# Patient Record
Sex: Female | Born: 1965 | Race: White | Hispanic: No | Smoking: Former smoker
Health system: Southern US, Community
[De-identification: ages and names within clinical notes are randomized; demographics above are authoritative.]

## PROBLEM LIST (undated history)

## (undated) DIAGNOSIS — F32A Depression, unspecified: Secondary | ICD-10-CM

## (undated) DIAGNOSIS — F419 Anxiety disorder, unspecified: Secondary | ICD-10-CM

## (undated) DIAGNOSIS — I700 Atherosclerosis of aorta: Secondary | ICD-10-CM

## (undated) DIAGNOSIS — J189 Pneumonia, unspecified organism: Secondary | ICD-10-CM

## (undated) DIAGNOSIS — Z79899 Other long term (current) drug therapy: Secondary | ICD-10-CM

## (undated) DIAGNOSIS — Z7901 Long term (current) use of anticoagulants: Secondary | ICD-10-CM

## (undated) DIAGNOSIS — E119 Type 2 diabetes mellitus without complications: Secondary | ICD-10-CM

## (undated) DIAGNOSIS — N3281 Overactive bladder: Secondary | ICD-10-CM

## (undated) DIAGNOSIS — I10 Essential (primary) hypertension: Secondary | ICD-10-CM

## (undated) DIAGNOSIS — R7303 Prediabetes: Secondary | ICD-10-CM

## (undated) DIAGNOSIS — M1612 Unilateral primary osteoarthritis, left hip: Secondary | ICD-10-CM

## (undated) DIAGNOSIS — K219 Gastro-esophageal reflux disease without esophagitis: Secondary | ICD-10-CM

## (undated) DIAGNOSIS — E782 Mixed hyperlipidemia: Secondary | ICD-10-CM

## (undated) DIAGNOSIS — I4891 Unspecified atrial fibrillation: Secondary | ICD-10-CM

## (undated) DIAGNOSIS — E785 Hyperlipidemia, unspecified: Secondary | ICD-10-CM

## (undated) DIAGNOSIS — M797 Fibromyalgia: Secondary | ICD-10-CM

---

## 1994-10-16 HISTORY — PX: ABDOMINAL SURGERY: SHX537

## 1994-10-16 HISTORY — PX: GASTRIC BYPASS OPEN: SUR638

## 2007-10-17 HISTORY — PX: TARSAL TUNNEL RELEASE: SUR1099

## 2007-10-17 HISTORY — PX: PLANTAR FASCIA SURGERY: SHX746

## 2008-01-20 ENCOUNTER — Ambulatory Visit: Payer: Self-pay

## 2009-02-16 ENCOUNTER — Ambulatory Visit: Payer: Self-pay | Admitting: Podiatry

## 2009-02-19 ENCOUNTER — Ambulatory Visit: Payer: Self-pay | Admitting: Podiatry

## 2009-10-21 ENCOUNTER — Emergency Department: Payer: Self-pay | Admitting: Internal Medicine

## 2013-08-18 ENCOUNTER — Emergency Department: Payer: Self-pay | Admitting: Emergency Medicine

## 2015-02-02 DIAGNOSIS — F32A Depression, unspecified: Secondary | ICD-10-CM | POA: Diagnosis present

## 2015-02-02 DIAGNOSIS — G2581 Restless legs syndrome: Secondary | ICD-10-CM | POA: Insufficient documentation

## 2015-02-02 DIAGNOSIS — M797 Fibromyalgia: Secondary | ICD-10-CM | POA: Insufficient documentation

## 2015-02-02 DIAGNOSIS — F331 Major depressive disorder, recurrent, moderate: Secondary | ICD-10-CM | POA: Insufficient documentation

## 2015-02-02 HISTORY — DX: Restless legs syndrome: G25.81

## 2015-04-28 ENCOUNTER — Encounter: Payer: Self-pay | Admitting: Emergency Medicine

## 2015-04-28 ENCOUNTER — Emergency Department
Admission: EM | Admit: 2015-04-28 | Discharge: 2015-04-28 | Discharge status: Against Medical Advice | Payer: No Typology Code available for payment source | Attending: Emergency Medicine | Admitting: Emergency Medicine

## 2015-04-28 ENCOUNTER — Emergency Department: Payer: No Typology Code available for payment source

## 2015-04-28 ENCOUNTER — Emergency Department
Admission: EM | Admit: 2015-04-28 | Discharge: 2015-04-28 | Disposition: A | Discharge status: Home / Self Care | Payer: No Typology Code available for payment source | Attending: Emergency Medicine | Admitting: Emergency Medicine

## 2015-04-28 DIAGNOSIS — Y998 Other external cause status: Secondary | ICD-10-CM | POA: Insufficient documentation

## 2015-04-28 DIAGNOSIS — Z3202 Encounter for pregnancy test, result negative: Secondary | ICD-10-CM | POA: Diagnosis not present

## 2015-04-28 DIAGNOSIS — Z79899 Other long term (current) drug therapy: Secondary | ICD-10-CM | POA: Diagnosis not present

## 2015-04-28 DIAGNOSIS — Z23 Encounter for immunization: Secondary | ICD-10-CM | POA: Insufficient documentation

## 2015-04-28 DIAGNOSIS — Z720 Tobacco use: Secondary | ICD-10-CM | POA: Diagnosis not present

## 2015-04-28 DIAGNOSIS — S161XXA Strain of muscle, fascia and tendon at neck level, initial encounter: Secondary | ICD-10-CM | POA: Insufficient documentation

## 2015-04-28 DIAGNOSIS — Y9389 Activity, other specified: Secondary | ICD-10-CM | POA: Insufficient documentation

## 2015-04-28 DIAGNOSIS — S0101XA Laceration without foreign body of scalp, initial encounter: Secondary | ICD-10-CM | POA: Insufficient documentation

## 2015-04-28 DIAGNOSIS — Z791 Long term (current) use of non-steroidal anti-inflammatories (NSAID): Secondary | ICD-10-CM | POA: Insufficient documentation

## 2015-04-28 DIAGNOSIS — Z880 Allergy status to penicillin: Secondary | ICD-10-CM | POA: Diagnosis not present

## 2015-04-28 DIAGNOSIS — Y92410 Unspecified street and highway as the place of occurrence of the external cause: Secondary | ICD-10-CM | POA: Insufficient documentation

## 2015-04-28 DIAGNOSIS — S199XXA Unspecified injury of neck, initial encounter: Secondary | ICD-10-CM | POA: Diagnosis not present

## 2015-04-28 DIAGNOSIS — S0990XA Unspecified injury of head, initial encounter: Secondary | ICD-10-CM | POA: Diagnosis present

## 2015-04-28 DIAGNOSIS — S0181XA Laceration without foreign body of other part of head, initial encounter: Secondary | ICD-10-CM | POA: Diagnosis not present

## 2015-04-28 HISTORY — DX: Fibromyalgia: M79.7

## 2015-04-28 LAB — GLUCOSE, CAPILLARY: Glucose-Capillary: 101 mg/dL — ABNORMAL HIGH (ref 65–99)

## 2015-04-28 LAB — POCT PREGNANCY, URINE: PREG TEST UR: NEGATIVE

## 2015-04-28 MED ORDER — TETANUS-DIPHTH-ACELL PERTUSSIS 5-2.5-18.5 LF-MCG/0.5 IM SUSP
0.5000 mL | Freq: Once | INTRAMUSCULAR | Status: AC
  Administered 2015-04-28: 0.5 mL via INTRAMUSCULAR
  Filled 2015-04-28: qty 0.5

## 2015-04-28 MED ORDER — LIDOCAINE-EPINEPHRINE (PF) 1 %-1:200000 IJ SOLN
INTRAMUSCULAR | Status: AC
  Administered 2015-04-28: 30 mL
  Filled 2015-04-28: qty 30

## 2015-04-28 MED ORDER — LIDOCAINE-EPINEPHRINE 2 %-1:100000 IJ SOLN: 30.0000 mL | Freq: Once | INTRAMUSCULAR | Status: DC

## 2015-04-28 MED ORDER — ACETAMINOPHEN 500 MG PO TABS
1000.0000 mg | ORAL_TABLET | ORAL | Status: AC
  Administered 2015-04-28: 1000 mg via ORAL
  Filled 2015-04-28: qty 2
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
## 2015-04-28 NOTE — Discharge Instructions (Signed)
Cervical Sprain A cervical sprain is an injury in the neck in which the strong, fibrous tissues (ligaments) that connect your neck bones stretch or tear. Cervical sprains can range from mild to severe. Severe cervical sprains can cause the neck vertebrae to be unstable. This can lead to damage of the spinal cord and can result in serious nervous system problems. The amount of time it takes for a cervical sprain to get better depends on the cause and extent of the injury. Most cervical sprains heal in 1 to 3 weeks. CAUSES  Severe cervical sprains may be caused by:   Contact sport injuries (such as from football, rugby, wrestling, hockey, auto racing, gymnastics, diving, martial arts, or boxing).   Motor vehicle collisions.   Whiplash injuries. This is an injury from a sudden forward and backward whipping movement of the head and neck.  Falls.  Mild cervical sprains may be caused by:   Being in an awkward position, such as while cradling a telephone between your ear and shoulder.   Sitting in a chair that does not offer proper support.   Working at a poorly [employer]scape [employer] station.   Looking up or down for long periods of time.  SYMPTOMS   Pain, soreness, stiffness, or a burning sensation in the front, back, or sides of the neck. This discomfort may develop immediately after the injury or slowly, 24 hours or more after the injury.   Pain or tenderness directly in the middle of the back of the neck.   Shoulder or upper back pain.   Limited ability to move the neck.   Headache.   Dizziness.   Weakness, numbness, or tingling in the hands or arms.   Muscle spasms.   Difficulty swallowing or chewing.   Tenderness and swelling of the neck.  DIAGNOSIS  Most of the time your health care provider can diagnose a cervical sprain by taking your history and doing a physical exam. Your health care provider will ask about previous neck injuries and any known neck  problems, such as arthritis in the neck. X-rays may be taken to find out if there are any other problems, such as with the bones of the neck. Other tests, such as a CT scan or MRI, may also be needed.  TREATMENT  Treatment depends on the severity of the cervical sprain. Mild sprains can be treated with rest, keeping the neck in place (immobilization), and pain medicines. Severe cervical sprains are immediately immobilized. Further treatment is done to help with pain, muscle spasms, and other symptoms and may include:  Medicines, such as pain relievers, numbing medicines, or muscle relaxants.   Physical therapy. This may involve stretching exercises, strengthening exercises, and posture training. Exercises and improved posture can help stabilize the neck, strengthen muscles, and help stop symptoms from returning.  HOME CARE INSTRUCTIONS   Put ice on the injured area.   Put ice in a plastic bag.   Place a towel between your skin and the bag.   Leave the ice on for 15-20 minutes, 3-4 times a day.   If your injury was severe, you may have been given a cervical collar to wear. A cervical collar is a two-piece collar designed to keep your neck from moving while it heals.  Do not remove the collar unless instructed by your health care provider.  If you have long hair, keep it outside of the collar.  Ask your health care provider before making any adjustments to your collar. Minor  adjustments may be required over time to improve comfort and reduce pressure on your chin or on the back of your head.  Ifyou are allowed to remove the collar for cleaning or bathing, follow your health care provider's instructions on how to do so safely.  Keep your collar clean by wiping it with mild soap and water and drying it completely. If the collar you have been given includes removable pads, remove them every 1-2 days and hand wash them with soap and water. Allow them to air dry. They should be completely  dry before you wear them in the collar.  If you are allowed to remove the collar for cleaning and bathing, wash and dry the skin of your neck. Check your skin for irritation or sores. If you see any, tell your health care provider.  Do not drive while wearing the collar.   Only take over-the-counter or prescription medicines for pain, discomfort, or fever as directed by your health care provider.   Keep all follow-up appointments as directed by your health care provider.   Keep all physical therapy appointments as directed by your health care provider.   Make any needed adjustments to your workstation to promote good posture.   Avoid positions and activities that make your symptoms worse.   Warm up and stretch before being active to help prevent problems.  SEEK MEDICAL CARE IF:   Your pain is not controlled with medicine.   You are unable to decrease your pain medicine over time as planned.   Your activity level is not improving as expected.  SEEK IMMEDIATE MEDICAL CARE IF:   You develop any bleeding.  You develop stomach upset.  You have signs of an allergic reaction to your medicine.   Your symptoms get worse.   You develop new, unexplained symptoms.   You have numbness, tingling, weakness, or paralysis in any part of your body.  MAKE SURE YOU:   Understand these instructions.  Will watch your condition.  Will get help right away if you are not doing well or get worse. Document Released: 07/30/2007 Document Revised: 10/07/2013 Document Reviewed: 04/09/2013 [facility] Patient Information 2015 [place], [place]. This information is not intended to replace advice given to you by your health care provider. Make sure you discuss any questions you have with your health care provider.  Laceration Care, Adult A laceration is a cut or lesion that goes through all layers of the skin and into the tissue just beneath the skin. TREATMENT  Some lacerations may not  require closure. Some lacerations may not be able to be closed due to an increased risk of infection. It is important to see your caregiver as soon as possible after an injury to minimize the risk of infection and maximize the opportunity for successful closure. If closure is appropriate, pain medicines may be given, if needed. The wound will be cleaned to help prevent infection. Your caregiver will use stitches (sutures), staples, wound glue (adhesive), or skin adhesive strips to repair the laceration. These tools bring the skin edges together to allow for faster healing and a better cosmetic outcome. However, all wounds will heal with a scar. Once the wound has healed, scarring can be minimized by covering the wound with sunscreen during the day for 1 full year. HOME CARE INSTRUCTIONS  For sutures or staples:  Keep the wound clean and dry.  If you were given a bandage (dressing), you should change it at least once a day. Also, change the dressing  if it becomes wet or dirty, or as directed by your caregiver.  Wash the wound with soap and water 2 times a day. Rinse the wound off with water to remove all soap. Pat the wound dry with a clean towel.  After cleaning, apply a thin layer of the antibiotic ointment as recommended by your caregiver. This will help prevent infection and keep the dressing from sticking.  You may shower as usual after the first 24 hours. Do not soak the wound in water until the sutures are removed.  Only take over-the-counter or prescription medicines for pain, discomfort, or fever as directed by your caregiver.  Get your sutures or staples removed as directed by your caregiver. For skin adhesive strips:  Keep the wound clean and dry.  Do not get the skin adhesive strips wet. You may bathe carefully, using caution to keep the wound dry.  If the wound gets wet, pat it dry with a clean towel.  Skin adhesive strips will fall off on their own. You may trim the strips as  the wound heals. Do not remove skin adhesive strips that are still stuck to the wound. They will fall off in time. For wound adhesive:  You may briefly wet your wound in the shower or bath. Do not soak or scrub the wound. Do not swim. Avoid periods of heavy perspiration until the skin adhesive has fallen off on its own. After showering or bathing, gently pat the wound dry with a clean towel.  Do not apply liquid medicine, cream medicine, or ointment medicine to your wound while the skin adhesive is in place. This may loosen the film before your wound is healed.  If a dressing is placed over the wound, be careful not to apply tape directly over the skin adhesive. This may cause the adhesive to be pulled off before the wound is healed.  Avoid prolonged exposure to sunlight or tanning lamps while the skin adhesive is in place. Exposure to ultraviolet light in the first year will darken the scar.  The skin adhesive will usually remain in place for 5 to 10 days, then naturally fall off the skin. Do not pick at the adhesive film. You may need a tetanus shot if:  You cannot remember when you had your last tetanus shot.  You have never had a tetanus shot. If you get a tetanus shot, your arm may swell, get red, and feel warm to the touch. This is common and not a problem. If you need a tetanus shot and you choose not to have one, there is a rare chance of getting tetanus. Sickness from tetanus can be serious. SEEK MEDICAL CARE IF:   You have redness, swelling, or increasing pain in the wound.  You see a red line that goes away from the wound.  You have yellowish-white fluid (pus) coming from the wound.  You have a fever.  You notice a bad smell coming from the wound or dressing.  Your wound breaks open before or after sutures have been removed.  You notice something coming out of the wound such as wood or glass.  Your wound is on your hand or foot and you cannot move a finger or toe. SEEK  IMMEDIATE MEDICAL CARE IF:   Your pain is not controlled with prescribed medicine.  You have severe swelling around the wound causing pain and numbness or a change in color in your arm, hand, leg, or foot.  Your wound splits open and starts  bleeding.  You have worsening numbness, weakness, or loss of function of any joint around or beyond the wound.  You develop painful lumps near the wound or on the skin anywhere on your body. MAKE SURE YOU:   Understand these instructions.  Will watch your condition.  Will get help right away if you are not doing well or get worse. Document Released: 10/02/2005 Document Revised: 12/25/2011 Document Reviewed: 03/28/2011 [facility] Patient Information 2015 [place]. This information is not intended to replace advice given to you by your health care provider. Make sure you discuss any questions you have with your health care provider.  Motor Vehicle Collision It is common to have multiple bruises and sore muscles after a motor vehicle collision (MVC). These tend to feel worse for the first 24 hours. You may have the most stiffness and soreness over the first several hours. You may also feel worse when you wake up the first morning after your collision. After this point, you will usually begin to improve with each day. The speed of improvement often depends on the severity of the collision, the number of injuries, and the location and nature of these injuries. HOME CARE INSTRUCTIONS  Put ice on the injured area.  Put ice in a plastic bag.  Place a towel between your skin and the bag.  Leave the ice on for 15-20 minutes, 3-4 times a day, or as directed by your health care provider.  Drink enough fluids to keep your urine clear or pale yellow. Do not drink alcohol.  Take a warm shower or bath once or twice a day. This will increase blood flow to sore muscles.  You may return to activities as directed by your caregiver. Be careful when lifting,  as this may aggravate neck or back pain.  Only take over-the-counter or prescription medicines for pain, discomfort, or fever as directed by your caregiver. Do not use aspirin. This may increase bruising and bleeding. SEEK IMMEDIATE MEDICAL CARE IF:  You have numbness, tingling, or weakness in the arms or legs.  You develop severe headaches not relieved with medicine.  You have severe neck pain, especially tenderness in the middle of the back of your neck.  You have changes in bowel or bladder control.  There is increasing pain in any area of the body.  You have shortness of breath, light-headedness, dizziness, or fainting.  You have chest pain.  You feel sick to your stomach (nauseous), throw up (vomit), or sweat.  You have increasing abdominal discomfort.  There is blood in your urine, stool, or vomit.  You have pain in your shoulder (shoulder strap areas).  You feel your symptoms are getting worse. MAKE SURE YOU:  Understand these instructions.  Will watch your condition.  Will get help right away if you are not doing well or get worse. Document Released: 10/02/2005 Document Revised: 02/16/2014 Document Reviewed: 03/01/2011 [facility] Patient Information 2015 [place], [place]. This information is not intended to replace advice given to you by your health care provider. Make sure you discuss any questions you have with your health care provider.

## 2015-04-29 ENCOUNTER — Encounter: Payer: Self-pay | Admitting: Emergency Medicine

## 2015-06-25 DIAGNOSIS — M5430 Sciatica, unspecified side: Secondary | ICD-10-CM | POA: Insufficient documentation

## 2015-08-07 DIAGNOSIS — M159 Polyosteoarthritis, unspecified: Secondary | ICD-10-CM | POA: Insufficient documentation

## 2015-09-11 ENCOUNTER — Emergency Department: Payer: No Typology Code available for payment source

## 2015-09-11 ENCOUNTER — Emergency Department
Admission: EM | Admit: 2015-09-11 | Discharge: 2015-09-11 | Disposition: A | Discharge status: Home / Self Care | Payer: No Typology Code available for payment source | Attending: Emergency Medicine | Admitting: Emergency Medicine

## 2015-09-11 ENCOUNTER — Encounter: Payer: Self-pay | Admitting: Medical Oncology

## 2015-09-11 DIAGNOSIS — Z791 Long term (current) use of non-steroidal anti-inflammatories (NSAID): Secondary | ICD-10-CM | POA: Diagnosis not present

## 2015-09-11 DIAGNOSIS — Z880 Allergy status to penicillin: Secondary | ICD-10-CM | POA: Diagnosis not present

## 2015-09-11 DIAGNOSIS — J40 Bronchitis, not specified as acute or chronic: Secondary | ICD-10-CM

## 2015-09-11 DIAGNOSIS — F17200 Nicotine dependence, unspecified, uncomplicated: Secondary | ICD-10-CM | POA: Diagnosis not present

## 2015-09-11 DIAGNOSIS — J209 Acute bronchitis, unspecified: Secondary | ICD-10-CM | POA: Insufficient documentation

## 2015-09-11 DIAGNOSIS — Z79899 Other long term (current) drug therapy: Secondary | ICD-10-CM | POA: Insufficient documentation

## 2015-09-11 DIAGNOSIS — R05 Cough: Secondary | ICD-10-CM | POA: Diagnosis present

## 2015-09-11 MED ORDER — ALBUTEROL SULFATE HFA 108 (90 BASE) MCG/ACT IN AERS: 2.0000 | INHALATION_SPRAY | RESPIRATORY_TRACT | Status: DC | PRN

## 2015-09-11 MED ORDER — ALBUTEROL SULFATE (2.5 MG/3ML) 0.083% IN NEBU
2.5000 mg | INHALATION_SOLUTION | Freq: Once | RESPIRATORY_TRACT | Status: AC
  Administered 2015-09-11: 2.5 mg via RESPIRATORY_TRACT
  Filled 2015-09-11: qty 3

## 2015-09-11 MED ORDER — PREDNISONE 10 MG PO TABS: 10.0000 mg | ORAL_TABLET | ORAL | Status: DC

## 2015-09-11 MED ORDER — AZITHROMYCIN 250 MG PO TABS: ORAL_TABLET | ORAL | Status: DC

## 2015-09-11 NOTE — Discharge Instructions (Signed)

## 2015-09-11 NOTE — ED Provider Notes (Signed)
[facility] Emergency Department Provider Note  ____________________________________________  Time seen: Approximately 10:16 AM  I have reviewed the triage vital signs and the nursing notes.   HISTORY  Chief Complaint Cough and Nasal Congestion    HPI Terri Wood is a 49 y.o. female who presents to emergency department complaining of cough and nasal congestion 1 week. She also endorses some mild body aches as well as fever for same period. The patient reports using multiple over-the-counter medications including cough syrups, DayQuil/NyQuil, TheraFlu. She reports no improvement with over-the-counter medications. Patient is able to maintain oral intake of solids and fluids. She does report some audible wheezing as well. She denies any difficulty breathing or swallowing, chest pain, nausea or vomiting, abdominal pain.   Past Medical History  Diagnosis Date  . Fibromyalgia     There are no active problems to display for this patient.   Past Surgical History  Procedure Laterality Date  . Abdominal surgery      Current Outpatient Rx  Name  Route  Sig  Dispense  Refill  . albuterol (PROVENTIL HFA;VENTOLIN HFA) 108 (90 BASE) MCG/ACT inhaler   Inhalation   Inhale 2 puffs into the lungs every 4 (four) hours as needed for wheezing or shortness of breath.   1 Inhaler   0   . azithromycin (ZITHROMAX Z-PAK) 250 MG tablet      Take 2 tablets (500 mg) on  Day 1,  followed by 1 tablet (250 mg) once daily on Days 2 through 5.   6 each   0   . clonazePAM (KLONOPIN) 0.5 MG tablet   Oral   Take 1 tablet by mouth 3 (three) times daily as needed.         . Cyanocobalamin 1000 MCG/ML LIQD   Oral   Take 5 mLs by mouth daily.         [name] Kitchen gabapentin (NEURONTIN) 300 MG capsule   Oral   Take 2 capsules by mouth 3 (three) times daily.         [name] Kitchen loratadine (CLARITIN) 10 MG tablet   Oral   Take 1 tablet by mouth every morning.         . meloxicam  (MOBIC) 7.5 MG tablet   Oral   Take 2 tablets by mouth daily.         . Multiple Vitamins-Minerals (MULTI FOR HER 50+ PO)   Oral   Take 1 tablet by mouth daily.         . Omega 3 1000 MG CAPS   Oral   Take 1 tablet by mouth every morning.         . predniSONE (DELTASONE) 10 MG tablet   Oral   Take 1 tablet (10 mg total) by mouth as directed.   21 tablet   0     Take on a daily basis of 6, 5, 4, 3, 2, 1   . rOPINIRole (REQUIP) 0.25 MG tablet   Oral   Take 1 tablet by mouth at bedtime.         [name] Kitchen venlafaxine XR (EFFEXOR-XR) 75 MG 24 hr capsule   Oral   Take 3 capsules by mouth every morning.           Allergies Penicillin g and Penicillins  No family history on file.  Social History Social History  Substance Use Topics  . Smoking status: Current Every Day Smoker  . Smokeless tobacco: None  . Alcohol Use: Yes  Review of Systems Constitutional: No fever/chills Eyes: No visual changes. ENT: Endorses nasal congestion. Endorses "scratchy throat." Cardiovascular: Denies chest pain. Respiratory: Denies shortness of breath. Endorses cough. Gastrointestinal: No abdominal pain.  No nausea, no vomiting.  No diarrhea.  No constipation. Genitourinary: Negative for dysuria. Musculoskeletal: Negative for back pain. Skin: Negative for rash. Neurological: Negative for headaches, focal weakness or numbness.  10-point ROS otherwise negative.  ____________________________________________   PHYSICAL EXAM:  VITAL SIGNS: ED Triage Vitals  Enc Vitals Group     BP 09/11/15 0911 162/86 mmHg     Pulse Rate 09/11/15 0911 70     Resp 09/11/15 0911 20     Temp 09/11/15 0911 98.1 F (36.7 C)     Temp Source 09/11/15 0911 Oral     SpO2 09/11/15 0911 96 %     Weight 09/11/15 0911 238 lb (107.956 kg)     Height 09/11/15 0911 5\' 3"  (1.6 m)     Head Cir --      Peak Flow --      Pain Score 09/11/15 0912 5     Pain Loc --      Pain Edu? --      Excl. in GC? --      Constitutional: Alert and oriented. Well appearing and in no acute distress. Eyes: Conjunctivae are normal. PERRL. EOMI. Head: Atraumatic. Nose: Moderate purulent congestion/rhinnorhea. No tenderness to percussion over the sinuses. Mouth/Throat: Mucous membranes are moist.  Oropharynx mildly erythematous. His nasal drip identified in the oropharynx. Tonsils are mildly erythematous but nonedematous and no exudates. Neck: No stridor.   Hematological/Lymphatic/Immunilogical: Diffuse, mobile, nontender anterior cervical lymphadenopathy. Cardiovascular: Normal rate, regular rhythm. Grossly normal heart sounds.  Good peripheral circulation. Respiratory: Normal respiratory effort.  No retractions. Lungs with respiratory and expiratory wheezing bilaterally. Scattered crackles bilaterally but more pronounced in left lower lobe. Good air entry into the bases. No absent breath sounds. Gastrointestinal: Soft and nontender. No distention. No abdominal bruits. No CVA tenderness. Musculoskeletal: No lower extremity tenderness nor edema.  No joint effusions. Neurologic:  Normal speech and language. No gross focal neurologic deficits are appreciated. No gait instability. Skin:  Skin is warm, dry and intact. No rash noted. Psychiatric: Mood and affect are normal. Speech and behavior are normal.  ____________________________________________   LABS (all labs ordered are listed, but only abnormal results are displayed)  Labs Reviewed - No data to display ____________________________________________  EKG   ____________________________________________  RADIOLOGY  Chest x-ray Impression: Worse lung markings suggestive for chronic changes. No acute findings. ____________________________________________   PROCEDURES  Procedure(s) performed: None  Critical Care performed: No  ____________________________________________   INITIAL IMPRESSION / ASSESSMENT AND PLAN / ED COURSE  Pertinent labs &  imaging results that were available during my care of the patient were reviewed by me and considered in my medical decision making (see chart for details).  Patient's history, symptoms, physical exam and taken into consideration for diagnosis. Patient's x-ray was negative for pneumonia. Patient's symptoms are likely attributed to bronchitis. Due to the patient's history of 7 days of symptoms I will place patient on antibiotics at this time. Patient will be also placed on albuterol inhaler as well as steroids. Patient verbalizes understanding of diagnosis and treatment plan and verbalizes compliance of same. ____________________________________________   FINAL CLINICAL IMPRESSION(S) / ED DIAGNOSES  Final diagnoses:  Bronchitis      Racheal PatchesJonathan D , PA-C 09/11/15 1158  Arnaldo NatalPaul F Malinda, MD 09/11/15 [phone]

## 2015-09-11 NOTE — ED Notes (Signed)
Pt reports cough, congestion, body aches and fever x 1 week.

## 2015-11-05 DIAGNOSIS — F325 Major depressive disorder, single episode, in full remission: Secondary | ICD-10-CM | POA: Insufficient documentation

## 2016-02-11 DIAGNOSIS — G8929 Other chronic pain: Secondary | ICD-10-CM | POA: Diagnosis present

## 2016-02-11 DIAGNOSIS — I10 Essential (primary) hypertension: Secondary | ICD-10-CM | POA: Diagnosis present

## 2016-02-11 DIAGNOSIS — Z6841 Body Mass Index (BMI) 40.0 and over, adult: Secondary | ICD-10-CM | POA: Diagnosis present

## 2016-02-11 DIAGNOSIS — R200 Anesthesia of skin: Secondary | ICD-10-CM | POA: Insufficient documentation

## 2016-02-11 HISTORY — DX: Other chronic pain: G89.29

## 2016-03-13 ENCOUNTER — Emergency Department: Payer: BLUE CROSS/BLUE SHIELD

## 2016-03-13 ENCOUNTER — Encounter: Payer: Self-pay | Admitting: Emergency Medicine

## 2016-03-13 ENCOUNTER — Emergency Department
Admission: EM | Admit: 2016-03-13 | Discharge: 2016-03-13 | Disposition: A | Discharge status: Home / Self Care | Payer: BLUE CROSS/BLUE SHIELD | Attending: Emergency Medicine | Admitting: Emergency Medicine

## 2016-03-13 DIAGNOSIS — I10 Essential (primary) hypertension: Secondary | ICD-10-CM | POA: Insufficient documentation

## 2016-03-13 DIAGNOSIS — Z79899 Other long term (current) drug therapy: Secondary | ICD-10-CM | POA: Diagnosis not present

## 2016-03-13 DIAGNOSIS — J209 Acute bronchitis, unspecified: Secondary | ICD-10-CM | POA: Insufficient documentation

## 2016-03-13 DIAGNOSIS — F17210 Nicotine dependence, cigarettes, uncomplicated: Secondary | ICD-10-CM | POA: Insufficient documentation

## 2016-03-13 DIAGNOSIS — R0602 Shortness of breath: Secondary | ICD-10-CM | POA: Diagnosis present

## 2016-03-13 HISTORY — DX: Anxiety disorder, unspecified: F41.9

## 2016-03-13 HISTORY — DX: Essential (primary) hypertension: I10

## 2016-03-13 LAB — COMPREHENSIVE METABOLIC PANEL
ALT: 21 U/L (ref 14–54)
ANION GAP: 10 (ref 5–15)
AST: 27 U/L (ref 15–41)
Albumin: 4.1 g/dL (ref 3.5–5.0)
Alkaline Phosphatase: 83 U/L (ref 38–126)
BUN: 11 mg/dL (ref 6–20)
CHLORIDE: 106 mmol/L (ref 101–111)
CO2: 22 mmol/L (ref 22–32)
Calcium: 9.3 mg/dL (ref 8.9–10.3)
Creatinine, Ser: 0.72 mg/dL (ref 0.44–1.00)
GFR calc non Af Amer: >60 mL/min (ref >60)
Glucose, Bld: 119 mg/dL — ABNORMAL HIGH (ref 65–99)
Potassium: 3.8 mmol/L (ref 3.5–5.1)
SODIUM: 138 mmol/L (ref 135–145)
Total Bilirubin: 0.5 mg/dL (ref 0.3–1.2)
Total Protein: 7.9 g/dL (ref 6.5–8.1)

## 2016-03-13 LAB — CBC WITH DIFFERENTIAL/PLATELET
Basophils Absolute: 0 10*3/uL (ref 0–0.1)
Eosinophils Absolute: 0.1 10*3/uL (ref 0–0.7)
Eosinophils Relative: 1 %
HCT: 45.9 % (ref 35.0–47.0)
Hemoglobin: 15.6 g/dL (ref 12.0–16.0)
Lymphs Abs: 1.5 10*3/uL (ref 1.0–3.6)
MCH: 30.8 pg (ref 26.0–34.0)
MCHC: 34 g/dL (ref 32.0–36.0)
MCV: 90.5 fL (ref 80.0–100.0)
Monocytes Absolute: 0.7 10*3/uL (ref 0.2–0.9)
Monocytes Relative: 10 %
Neutro Abs: 5.3 10*3/uL (ref 1.4–6.5)
Neutrophils Relative %: 68 %
Platelets: 270 10*3/uL (ref 150–440)
RBC: 5.07 MIL/uL (ref 3.80–5.20)
RDW: 15.4 % — ABNORMAL HIGH (ref 11.5–14.5)
WBC: 7.7 10*3/uL (ref 3.6–11.0)

## 2016-03-13 LAB — TROPONIN I: Troponin I: <0.03 ng/mL (ref <0.031)

## 2016-03-13 LAB — LACTIC ACID, PLASMA: LACTIC ACID, VENOUS: 0.8 mmol/L (ref 0.5–2.0)

## 2016-03-13 MED ORDER — IPRATROPIUM-ALBUTEROL 0.5-2.5 (3) MG/3ML IN SOLN
3.0000 mL | Freq: Once | RESPIRATORY_TRACT | Status: AC
  Administered 2016-03-13: 3 mL via RESPIRATORY_TRACT
  Filled 2016-03-13: qty 3

## 2016-03-13 MED ORDER — AZITHROMYCIN 250 MG PO TABS: ORAL_TABLET | ORAL | Status: DC

## 2016-03-13 MED ORDER — PREDNISONE 20 MG PO TABS: 40.0000 mg | ORAL_TABLET | Freq: Every day | ORAL | Status: DC

## 2016-03-13 MED ORDER — PREDNISONE 20 MG PO TABS
60.0000 mg | ORAL_TABLET | Freq: Once | ORAL | Status: AC
  Administered 2016-03-13: 60 mg via ORAL
  Filled 2016-03-13: qty 3

## 2016-03-13 MED ORDER — LORAZEPAM 2 MG/ML IJ SOLN
1.0000 mg | Freq: Once | INTRAMUSCULAR | Status: AC
  Administered 2016-03-13: 1 mg via INTRAVENOUS
  Filled 2016-03-13: qty 1

## 2016-03-13 MED ORDER — KETOROLAC TROMETHAMINE 30 MG/ML IJ SOLN
15.0000 mg | Freq: Once | INTRAMUSCULAR | Status: AC
  Administered 2016-03-13: 15 mg via INTRAVENOUS
  Filled 2016-03-13: qty 1

## 2016-03-13 NOTE — ED Provider Notes (Signed)
Time Seen: Approximately 0744  I have reviewed the triage notes  Chief Complaint: Pneumonia and Shortness of Breath   History of Present Illness: Terri Wood is a 50 y.o. female who states that she's had a 72 hour history of a occasional productive cough and shortness of breath. [facility] and was started on Levaquin for possible pneumonia. She states she did not have a chest x-ray was just ordered the antibiotic based on physical exam findings. States she still has shortness of breath and came the emergency department for her hot and cold flashes along with bad nightmares. Sure whether or not to side effect from the antibiotic. She denies any focal chest pain but states that she's had some low back discomfort. She denies any nausea, vomiting, diarrhea. She denies any history of pulmonary embolism or DVT.   Past Medical History  Diagnosis Date  . Fibromyalgia   . Hypertension   . Anxiety     There are no active problems to display for this patient.   Past Surgical History  Procedure Laterality Date  . Abdominal surgery      Past Surgical History  Procedure Laterality Date  . Abdominal surgery      Current Outpatient Rx  Name  Route  Sig  Dispense  Refill  . albuterol (PROVENTIL HFA;VENTOLIN HFA) 108 (90 BASE) MCG/ACT inhaler   Inhalation   Inhale 2 puffs into the lungs every 4 (four) hours as needed for wheezing or shortness of breath.   1 Inhaler   0   . azithromycin (ZITHROMAX Z-PAK) 250 MG tablet      Take 2 tablets (500 mg) on  Day 1,  followed by 1 tablet (250 mg) once daily on Days 2 through 5.   6 each   0   . clonazePAM (KLONOPIN) 0.5 MG tablet   Oral   Take 1 tablet by mouth 3 (three) times daily as needed.         . Cyanocobalamin 1000 MCG/ML LIQD   Oral   Take 5 mLs by mouth daily.         [name] Kitchen gabapentin (NEURONTIN) 300 MG capsule   Oral   Take 2 capsules by mouth 3 (three) times daily.         [name] Kitchen loratadine  (CLARITIN) 10 MG tablet   Oral   Take 1 tablet by mouth every morning.         . meloxicam (MOBIC) 7.5 MG tablet   Oral   Take 2 tablets by mouth daily.         . Multiple Vitamins-Minerals (MULTI FOR HER 50+ PO)   Oral   Take 1 tablet by mouth daily.         . Omega 3 1000 MG CAPS   Oral   Take 1 tablet by mouth every morning.         . predniSONE (DELTASONE) 10 MG tablet   Oral   Take 1 tablet (10 mg total) by mouth as directed.   21 tablet   0     Take on a daily basis of 6, 5, 4, 3, 2, 1   . rOPINIRole (REQUIP) 0.25 MG tablet   Oral   Take 1 tablet by mouth at bedtime.         [name] Kitchen venlafaxine XR (EFFEXOR-XR) 75 MG 24 hr capsule   Oral   Take 3 capsules by mouth every morning.  Allergies:  Penicillin g and Penicillins  Family History: History reviewed. No pertinent family history.  Social History: Social History  Substance Use Topics  . Smoking status: Current Every Day Smoker -- 1.00 packs/day    Types: Cigarettes  . Smokeless tobacco: None  . Alcohol Use: Yes     Review of Systems:   10 point review of systems was performed and was otherwise negative:  Constitutional: No fever Eyes: No visual disturbances ENT: No sore throat, ear pain Cardiac: No chest pain Respiratory: Mild shortness of breath shortness of breath, wheezing, or stridor Abdomen: No abdominal pain, no vomiting, No diarrhea Endocrine: No weight loss, No night sweats Extremities: No peripheral edema, cyanosis Skin: No rashes, easy bruising Neurologic: No focal weakness, trouble with speech or swollowing Urologic: No dysuria, Hematuria, or urinary frequency *Low back pain in the middle of the back without any weakness in either lower extremity with normal bowel and bladder function  Physical Exam:  ED Triage Vitals  Enc Vitals Group     BP 03/13/16 0636 138/99 mmHg     Pulse Rate 03/13/16 0636 82     Resp 03/13/16 0636 22     Temp 03/13/16 0636 98 F (36.7  C)     Temp Source 03/13/16 0636 Oral     SpO2 03/13/16 0636 94 %     Weight 03/13/16 0636 240 lb (108.863 kg)     Height 03/13/16 0636  (1.6 m)     Head Cir --      Peak Flow --      Pain Score 03/13/16 0638 4     Pain Loc --      Pain Edu? --      Excl. in GC? --     General: Awake , Alert , and Oriented times 3; GCS 15 Anxious. No signs of respiratory distress Head: Normal cephalic , atraumatic Eyes: Pupils equal , round, reactive to light Nose/Throat: No nasal drainage, patent upper airway without erythema or exudate.  Neck: Supple, Full range of motion, No anterior adenopathy or palpable thyroid masses Lungs: Diffuse wheezing auscultated in all lung fields. Wheezing is symmetric without stridor or rhonchi noted Heart: Regular rate, regular rhythm without murmurs , gallops , or rubs Abdomen: Soft, non tender without rebound, guarding , or rigidity; bowel sounds positive and symmetric in all 4 quadrants. No organomegaly .        Extremities: No calf tenderness or swelling, negative Homans sign 2 plus symmetric pulses. No edema, clubbing or cyanosis Neurologic: normal ambulation, Motor symmetric without deficits, sensory intact Skin: warm, dry, no rashes   Labs:   All laboratory work was reviewed including any pertinent negatives or positives listed below:  Labs Reviewed  COMPREHENSIVE METABOLIC PANEL - Abnormal; Notable for the following:    Glucose, Bld 119 (*)    All other components within normal limits  LACTIC ACID, PLASMA  CBC WITH DIFFERENTIAL/PLATELET  LACTIC ACID, PLASMA    EKG:  ED ECG REPORT I, Jennye Moccasin, the attending physician, personally viewed and interpreted this ECG.  Date: 03/13/2016 EKG Time: 0657 Rate: 78 Rhythm: normal sinus rhythm QRS Axis: normal Intervals: normal ST/T Wave abnormalities: Poor R-wave progression noticed in the anterior leads Conduction Disturbances: none Narrative Interpretation: unremarkable No acute ischemic  changes noted   Radiology: *  DG Chest 2 View (Final result) Result time: 03/13/16 07:30:50   Final result by Rad Results In Interface (03/13/16 07:30:50)   Narrative:   CLINICAL  DATA: Shortness of breath, productive cough  EXAM: CHEST 2 VIEW  COMPARISON: 09/11/2015 chest radiograph.  FINDINGS: Surgical clips are seen in the left upper quadrant of the abdomen. Stable cardiomediastinal silhouette with normal heart size. No pneumothorax. No pleural effusion. Lungs appear clear, with no acute consolidative airspace disease and no pulmonary edema.  IMPRESSION: No active cardiopulmonary disease.   Electronically Signed By: Delbert PhenixJason A Poff M.D. On: 03/13/2016 07:30       I personally reviewed the radiologic studies    ED Course: Patient's stay here was uneventful and she was started on steroid therapy and given 2 breathing treatments here in emergency department symptomatic improvement. Her pulse ox remained stable and she states that she is feeling better at this juncture. Her chest x-ray shows no evidence of pneumonia nor does her physical exam and I felt this is most likely acute bronchitis. We discussed the differential for bronchitis including viral, allergy, bacterial bronchitis. I advised her that would change her antibiotic but not to expect that she would have sudden symptomatic improvement at this point. The antibiotic is primarily to prevent pneumonia at this juncture. She'll be placed on a prednisone bolus and to continue her albuterol inhaler at home.    Assessment: Acute bronchitis with bronchospasm   F   Plan: * Outpatient management Patient was advised to return immediately if condition worsens. Patient was advised to follow up with their primary care physician or other specialized physicians involved in their outpatient care. The patient and/or family member/power of attorney had laboratory results reviewed at the bedside. All questions and concerns  were addressed and appropriate discharge instructions were distributed by the nursing staff.             Jennye MoccasinBrian S , MD 03/13/16 [phone]

## 2016-03-13 NOTE — ED Notes (Signed)
Pt assisted to wheelchair upon arrival; Pt says she was diagnosed with [facility] at [facility] on Friday; taking levaquin; not feeling any better; short of breath; hot/cold flashes

## 2016-03-13 NOTE — Discharge Instructions (Signed)
Acute Bronchitis °Bronchitis is inflammation of the airways that extend from the windpipe into the lungs (bronchi). The inflammation often causes mucus to develop. This leads to a cough, which is the most common symptom of bronchitis.  °In acute bronchitis, the condition usually develops suddenly and goes away over time, usually in a couple weeks. Smoking, allergies, and asthma can make bronchitis worse. Repeated episodes of bronchitis may cause further lung problems.  °CAUSES °Acute bronchitis is most often caused by the same virus that causes a cold. The virus can spread from person to person (contagious) through coughing, sneezing, and touching contaminated objects. °SIGNS AND SYMPTOMS  °· Cough.   °· Fever.   °· Coughing up mucus.   °· Body aches.   °· Chest congestion.   °· Chills.   °· Shortness of breath.   °· Sore throat.   °DIAGNOSIS  °Acute bronchitis is usually diagnosed through a physical exam. Your health care provider will also ask you questions about your medical history. Tests, such as chest X-rays, are sometimes done to rule out other conditions.  °TREATMENT  °Acute bronchitis usually goes away in a couple weeks. Oftentimes, no medical treatment is necessary. Medicines are sometimes given for relief of fever or cough. Antibiotic medicines are usually not needed but may be prescribed in certain situations. In some cases, an inhaler may be recommended to help reduce shortness of breath and control the cough. A cool mist vaporizer may also be used to help thin bronchial secretions and make it easier to clear the chest.  °HOME CARE INSTRUCTIONS °· Get plenty of rest.   °· Drink enough fluids to keep your urine clear or pale yellow (unless you have a medical condition that requires fluid restriction). Increasing fluids may help thin your respiratory secretions (sputum) and reduce chest congestion, and it will prevent dehydration.   °· Take medicines only as directed by your health care provider. °· If  you were prescribed an antibiotic medicine, finish it all even if you start to feel better. °· Avoid smoking and secondhand smoke. Exposure to cigarette smoke or irritating chemicals will make bronchitis worse. If you are a smoker, consider using nicotine gum or skin patches to help control withdrawal symptoms. Quitting smoking will help your lungs heal faster.   °· Reduce the chances of another bout of acute bronchitis by washing your hands frequently, avoiding people with cold symptoms, and trying not to touch your hands to your mouth, nose, or eyes.   °· Keep all follow-up visits as directed by your health care provider.   °SEEK MEDICAL CARE IF: °Your symptoms do not improve after 1 week of treatment.  °SEEK IMMEDIATE MEDICAL CARE IF: °· You develop an increased fever or chills.   °· You have chest pain.   °· You have severe shortness of breath. °· You have bloody sputum.   °· You develop dehydration. °· You faint or repeatedly feel like you are going to pass out. °· You develop repeated vomiting. °· You develop a severe headache. °MAKE SURE YOU:  °· Understand these instructions. °· Will watch your condition. °· Will get help right away if you are not doing well or get worse. °  °This information is not intended to replace advice given to you by your health care provider. Make sure you discuss any questions you have with your health care provider. °  °Document Released: 11/09/2004 Document Revised: 10/23/2014 Document Reviewed: 03/25/2013 °Elsevier Interactive Patient Education ©2016 [employer] ° °Please return immediately if condition worsens. Please contact her primary physician or the physician you were given for   referral. If you have any specialist physicians involved in her treatment and plan please also contact them. Thank you for using [facility]. ° °

## 2016-12-15 DIAGNOSIS — N951 Menopausal and female climacteric states: Secondary | ICD-10-CM | POA: Insufficient documentation

## 2016-12-15 DIAGNOSIS — M48061 Spinal stenosis, lumbar region without neurogenic claudication: Secondary | ICD-10-CM | POA: Insufficient documentation

## 2016-12-15 DIAGNOSIS — M5416 Radiculopathy, lumbar region: Secondary | ICD-10-CM

## 2016-12-15 HISTORY — DX: Radiculopathy, lumbar region: M54.16

## 2017-02-25 DIAGNOSIS — F17200 Nicotine dependence, unspecified, uncomplicated: Secondary | ICD-10-CM

## 2017-04-12 ENCOUNTER — Other Ambulatory Visit: Payer: Self-pay | Admitting: Physical Medicine and Rehabilitation

## 2017-04-12 DIAGNOSIS — M5416 Radiculopathy, lumbar region: Secondary | ICD-10-CM

## 2017-04-20 ENCOUNTER — Ambulatory Visit
Admission: RE | Admit: 2017-04-20 | Discharge: 2017-04-20 | Disposition: A | Discharge status: Home / Self Care | Payer: BLUE CROSS/BLUE SHIELD | Source: Ambulatory Visit | Attending: Physical Medicine and Rehabilitation | Admitting: Physical Medicine and Rehabilitation

## 2017-04-20 DIAGNOSIS — M5116 Intervertebral disc disorders with radiculopathy, lumbar region: Secondary | ICD-10-CM | POA: Insufficient documentation

## 2017-04-20 DIAGNOSIS — M48061 Spinal stenosis, lumbar region without neurogenic claudication: Secondary | ICD-10-CM | POA: Diagnosis not present

## 2017-04-20 DIAGNOSIS — M4186 Other forms of scoliosis, lumbar region: Secondary | ICD-10-CM | POA: Diagnosis not present

## 2017-04-20 DIAGNOSIS — M5416 Radiculopathy, lumbar region: Secondary | ICD-10-CM

## 2017-08-07 DIAGNOSIS — M1711 Unilateral primary osteoarthritis, right knee: Secondary | ICD-10-CM | POA: Insufficient documentation

## 2017-09-13 DIAGNOSIS — E875 Hyperkalemia: Secondary | ICD-10-CM

## 2017-09-13 HISTORY — DX: Hyperkalemia: E87.5

## 2017-10-16 DIAGNOSIS — M1612 Unilateral primary osteoarthritis, left hip: Secondary | ICD-10-CM

## 2017-10-16 HISTORY — DX: Unilateral primary osteoarthritis, left hip: M16.12

## 2018-02-27 DIAGNOSIS — M48061 Spinal stenosis, lumbar region without neurogenic claudication: Secondary | ICD-10-CM

## 2018-02-27 HISTORY — DX: Spinal stenosis, lumbar region without neurogenic claudication: M48.061

## 2018-02-28 DIAGNOSIS — M2550 Pain in unspecified joint: Secondary | ICD-10-CM | POA: Insufficient documentation

## 2018-03-10 ENCOUNTER — Emergency Department: Payer: BLUE CROSS/BLUE SHIELD

## 2018-03-10 ENCOUNTER — Other Ambulatory Visit: Payer: Self-pay

## 2018-03-10 ENCOUNTER — Encounter: Payer: Self-pay | Admitting: Emergency Medicine

## 2018-03-10 ENCOUNTER — Emergency Department
Admission: EM | Admit: 2018-03-10 | Discharge: 2018-03-10 | Disposition: A | Discharge status: Home / Self Care | Payer: BLUE CROSS/BLUE SHIELD | Attending: Emergency Medicine | Admitting: Emergency Medicine

## 2018-03-10 DIAGNOSIS — M87052 Idiopathic aseptic necrosis of left femur: Secondary | ICD-10-CM | POA: Insufficient documentation

## 2018-03-10 DIAGNOSIS — M25552 Pain in left hip: Secondary | ICD-10-CM | POA: Diagnosis present

## 2018-03-10 DIAGNOSIS — S72065A Nondisplaced articular fracture of head of left femur, initial encounter for closed fracture: Secondary | ICD-10-CM | POA: Diagnosis not present

## 2018-03-10 DIAGNOSIS — Y999 Unspecified external cause status: Secondary | ICD-10-CM | POA: Insufficient documentation

## 2018-03-10 DIAGNOSIS — X58XXXA Exposure to other specified factors, initial encounter: Secondary | ICD-10-CM | POA: Diagnosis not present

## 2018-03-10 DIAGNOSIS — I10 Essential (primary) hypertension: Secondary | ICD-10-CM | POA: Diagnosis not present

## 2018-03-10 DIAGNOSIS — S72052A Unspecified fracture of head of left femur, initial encounter for closed fracture: Secondary | ICD-10-CM

## 2018-03-10 DIAGNOSIS — Z79899 Other long term (current) drug therapy: Secondary | ICD-10-CM | POA: Diagnosis not present

## 2018-03-10 DIAGNOSIS — M25561 Pain in right knee: Secondary | ICD-10-CM | POA: Diagnosis not present

## 2018-03-10 DIAGNOSIS — Y9389 Activity, other specified: Secondary | ICD-10-CM | POA: Diagnosis not present

## 2018-03-10 DIAGNOSIS — F17210 Nicotine dependence, cigarettes, uncomplicated: Secondary | ICD-10-CM | POA: Diagnosis not present

## 2018-03-10 DIAGNOSIS — Y9289 Other specified places as the place of occurrence of the external cause: Secondary | ICD-10-CM | POA: Diagnosis not present

## 2018-03-10 LAB — CBC WITH DIFFERENTIAL/PLATELET
BASOS ABS: 0.1 10*3/uL (ref 0–0.1)
BASOS PCT: 1 %
Eosinophils Absolute: 0.2 10*3/uL (ref 0–0.7)
Eosinophils Relative: 3 %
HEMATOCRIT: 39.2 % (ref 35.0–47.0)
Hemoglobin: 13.1 g/dL (ref 12.0–16.0)
LYMPHS PCT: 28 %
Lymphs Abs: 1.9 10*3/uL (ref 1.0–3.6)
MCH: 29.9 pg (ref 26.0–34.0)
MCHC: 33.3 g/dL (ref 32.0–36.0)
MCV: 89.7 fL (ref 80.0–100.0)
Monocytes Absolute: 0.8 10*3/uL (ref 0.2–0.9)
Monocytes Relative: 11 %
NEUTROS ABS: 4 10*3/uL (ref 1.4–6.5)
Neutrophils Relative %: 57 %
Platelets: 293 10*3/uL (ref 150–440)
RBC: 4.37 MIL/uL (ref 3.80–5.20)
RDW: 14.8 % — ABNORMAL HIGH (ref 11.5–14.5)
WBC: 7.1 10*3/uL (ref 3.6–11.0)

## 2018-03-10 LAB — COMPREHENSIVE METABOLIC PANEL
ALT: 20 U/L (ref 14–54)
AST: 21 U/L (ref 15–41)
Albumin: 3.6 g/dL (ref 3.5–5.0)
Alkaline Phosphatase: 74 U/L (ref 38–126)
Anion gap: 7 (ref 5–15)
BUN: 15 mg/dL (ref 6–20)
CO2: 30 mmol/L (ref 22–32)
Calcium: 9.4 mg/dL (ref 8.9–10.3)
Chloride: 100 mmol/L — ABNORMAL LOW (ref 101–111)
Creatinine, Ser: 0.78 mg/dL (ref 0.44–1.00)
GFR calc Af Amer: >60 mL/min (ref >60)
GFR calc non Af Amer: >60 mL/min (ref >60)
Glucose, Bld: 91 mg/dL (ref 65–99)
Potassium: 3.8 mmol/L (ref 3.5–5.1)
Sodium: 137 mmol/L (ref 135–145)
Total Bilirubin: 0.4 mg/dL (ref 0.3–1.2)
Total Protein: 7 g/dL (ref 6.5–8.1)

## 2018-03-10 MED ORDER — LORAZEPAM 2 MG/ML IJ SOLN
1.0000 mg | Freq: Once | INTRAMUSCULAR | Status: AC
  Administered 2018-03-10: 1 mg via INTRAVENOUS
  Filled 2018-03-10: qty 1

## 2018-03-10 MED ORDER — METHYLPREDNISOLONE 4 MG PO TBPK: ORAL_TABLET | ORAL | 0 refills | Status: DC

## 2018-03-10 MED ORDER — OXYCODONE-ACETAMINOPHEN 5-325 MG PO TABS: 1.0000 | ORAL_TABLET | ORAL | 0 refills | Status: DC | PRN

## 2018-03-10 MED ORDER — MORPHINE SULFATE (PF) 4 MG/ML IV SOLN
4.0000 mg | Freq: Once | INTRAVENOUS | Status: AC
  Administered 2018-03-10: 4 mg via INTRAVENOUS
  Filled 2018-03-10: qty 1

## 2018-03-10 MED ORDER — MORPHINE SULFATE (PF) 4 MG/ML IV SOLN: 4.0000 mg | Freq: Once | INTRAVENOUS | Status: DC

## 2018-03-10 MED ORDER — ONDANSETRON HCL 4 MG/2ML IJ SOLN
4.0000 mg | Freq: Once | INTRAMUSCULAR | Status: AC
Start: 2018-03-10 — End: 2018-03-10
  Administered 2018-03-10: 4 mg via INTRAVENOUS
  Filled 2018-03-10: qty 2

## 2018-03-10 NOTE — ED Notes (Signed)
Pt up and into w/c and taken to bathroom and placed herself on toilet.

## 2018-03-10 NOTE — Discharge Instructions (Addendum)
Follow-up with the orthopedics that you are currently scheduled with on Tuesday.  Take the pain medication as prescribed.  Apply ice to the hip as needed.  If you begin to have a fever or chills please return the emergency department

## 2018-03-10 NOTE — ED Triage Notes (Signed)
Pt reports left hip pain and right knee pain due to osteoarthritis reports saw her PCP and referred to Ortho MD reports pain is not better "I can not wait until Tuesday to see the Dr. [name] Kitchen Pt talks in complete sentences no distress noted

## 2018-03-10 NOTE — ED Notes (Signed)
Pt reports she had a X-ray of hip and knee done by her PCP last week

## 2018-03-10 NOTE — ED Provider Notes (Signed)
[facility] Emergency Department Provider Note  ____________________________________________   First MD Initiated Contact with Patient 03/10/18 1035     (approximate)  I have reviewed the triage vital signs and the nursing notes.   HISTORY  Chief Complaint Hip Pain (left side ) and Knee Pain (right knee )    HPI Terri Wood is a 52 y.o. female presents emergency department complaining of increasing left hip pain.  She states that she had an x-ray that showed a moderate amount of arthritis.  She states she is been worsening over the last week.  She has a referral to see orthopedics on Tuesday but just cannot wait that long.  She states she is unable to bear weight.  She denies any numbness and tingling.  States she does have a history of back problems but this is different.  Past Medical History:  Diagnosis Date  . Anxiety   . Fibromyalgia   . Hypertension     There are no active problems to display for this patient.   Past Surgical History:  Procedure Laterality Date  . ABDOMINAL SURGERY      Prior to Admission medications   Medication Sig Start Date End Date Taking? Authorizing Provider  albuterol (PROVENTIL HFA;VENTOLIN HFA) 108 (90 BASE) MCG/ACT inhaler Inhale 2 puffs into the lungs every 4 (four) hours as needed for wheezing or shortness of breath. 09/11/15   Cuthriell, Charline Bills, PA-C  azithromycin (ZITHROMAX Z-PAK) 250 MG tablet Take 2 tablets (500 mg) on  Day 1,  followed by 1 tablet (250 mg) once daily on Days 2 through 5. 03/13/16   Daymon Larsen, MD  clonazePAM (KLONOPIN) 0.5 MG tablet Take 1 tablet by mouth 3 (three) times daily as needed. 12/11/14   [provider]  Cyanocobalamin 1000 MCG/ML LIQD Take 5 mLs by mouth daily.    [provider]  gabapentin (NEURONTIN) 300 MG capsule Take 2 capsules by mouth 3 (three) times daily. 02/02/15   [provider]  loratadine (CLARITIN) 10 MG tablet Take 1 tablet by  mouth every morning.    [provider]  meloxicam (MOBIC) 7.5 MG tablet Take 2 tablets by mouth daily. 02/02/15   [provider]  methylPREDNISolone (MEDROL DOSEPAK) 4 MG TBPK tablet Take 6 pills on day one then decrease by 1 pill each day 03/10/18   Versie Starks, PA-C  Multiple Vitamins-Minerals (MULTI FOR HER 50+ PO) Take 1 tablet by mouth daily.    [provider]  Omega 3 1000 MG CAPS Take 1 tablet by mouth every morning.    [provider]  oxyCODONE-acetaminophen (PERCOCET/ROXICET) 5-325 MG tablet Take 1 tablet by mouth every 4 (four) hours as needed for severe pain. 03/10/18   Fisher, Linden Dolin, PA-C  predniSONE (DELTASONE) 20 MG tablet Take 2 tablets (40 mg total) by mouth daily. 03/14/16   Daymon Larsen, MD  rOPINIRole (REQUIP) 0.25 MG tablet Take 1 tablet by mouth at bedtime. 12/11/14   [provider]  venlafaxine XR (EFFEXOR-XR) 75 MG 24 hr capsule Take 3 capsules by mouth every morning. 01/24/15   [provider]    Allergies Penicillin g and Penicillins  No family history on file.  Social History Social History   Tobacco Use  . Smoking status: Current Every Day Smoker    Packs/day: 1.00    Types: Cigarettes  Substance Use Topics  . Alcohol use: Yes  . Drug use: No    Review of  Systems  Constitutional: No fever/chills Eyes: No visual changes. ENT: No sore throat. Respiratory: Denies cough Genitourinary: Negative for dysuria. Musculoskeletal: Negative for back pain.  Positive for left hip pain Skin: Negative for rash.    ____________________________________________   PHYSICAL EXAM:  VITAL SIGNS: ED Triage Vitals  Enc Vitals Group     BP 03/10/18 0850 (!) 121/93     Pulse Rate 03/10/18 0850 69     Resp 03/10/18 0850 20     Temp 03/10/18 0850 98.2 F (36.8 C)     Temp Source 03/10/18 0850 Oral     SpO2 03/10/18 0850 95 %     Weight 03/10/18 0852 248 lb (112.5 kg)     Height 03/10/18 0852 _0   (1.575 m)     Head Circumference --      Peak Flow --      Pain Score 03/10/18 0851 9     Pain Loc --      Pain Edu? --      Excl. in [place]? --     Constitutional: Alert and oriented. Well appearing and in no acute distress. Eyes: Conjunctivae are normal.  Head: Atraumatic. Nose: No congestion/rhinnorhea. Mouth/Throat: Mucous membranes are moist.   Cardiovascular: Normal rate, regular rhythm.  Heart sounds are normal Respiratory: Normal respiratory effort.  No retractions, lungs are clear to auscultation GU: deferred Musculoskeletal: Lumbar spine is mildly tender.  The left hip is extremely tender.  She has decreased range of motion of the left hip secondary to pain.  Legs are of equal length.  She is neurovascularly intact. Neurologic:  Normal speech and language.  Skin:  Skin is warm, dry and intact. No rash noted. Psychiatric: Mood and affect are normal. Speech and behavior are normal.  ____________________________________________   LABS (all labs ordered are listed, but only abnormal results are displayed)  Labs Reviewed  CBC WITH DIFFERENTIAL/PLATELET - Abnormal; Notable for the following components:      Result Value   RDW 14.8 (*)    All other components within normal limits  COMPREHENSIVE METABOLIC PANEL - Abnormal; Notable for the following components:   Chloride 100 (*)    All other components within normal limits   ____________________________________________   ____________________________________________  RADIOLOGY  CT of the left hip shows questionable AVN and fracture of the femoral head MRI of the left hip shows questionable avascular necrosis with a fracture of the femoral head.  There is inflammation in the joint space.  Other findings of inflammation are also noted.  ____________________________________________   PROCEDURES  Procedure(s) performed: Saline lock, morphine 4 mg IV, repeat 4 mg morphine IV, Ativan 1 mg IV prior to  MRI  Procedures    ____________________________________________   INITIAL IMPRESSION / ASSESSMENT AND PLAN / ED COURSE  Pertinent labs & imaging results that were available during my care of the patient were reviewed by me and considered in my medical decision making (see chart for details).  Patient is 52 year old female presents emergency department complaining of increasing left hip pain.  She states she is unable to bear weight at this time.  She denies any injury but is scheduled to see orthopedics next week.  She states she just cannot wait that long.  Physical exam the patient has some lumbar tenderness.  The left hip is extremely tender.  She has decreased range of motion secondary to pain.  She is neurovascularly intact.  CT of the left hip shows a questionable AVN with femoral head  fracture.  Discussed the CT results with Dr. Adin Hector.  He suggest we get a MRI of the left hip because he has more concerns of AVN.  He states that she has AVN she can be discharged.  If there is a fracture in the femoral neck he will have to admit her via the hospitalist.  MRI of the left hip was ordered.  The patient was given 1 mg Ativan prior to MRI as she does have some problems with claustrophobia.   MRI results showed possible AVN with the fracture at the femoral head.  There are no other fractures noted.  Discussed the findings with the patient and her husband.  Explained that Dr. Adin Hector had felt she could return home if the fracture was due to the AVN.  She and her husband both state they understand.  They will follow-up with their doctor that they have an appointment with on Tuesday.  She was given a prescription for Percocet and a Medrol Dosepak.  She is to use a walker and not bear weight on the left leg as much as possible.  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  She states she understands and she  was discharged in stable condition  As part of my medical decision making, I reviewed the following data within the [place] notes reviewed and incorporated, Labs reviewed CBC and met C are normal, Old chart reviewed, Radiograph reviewed CT of the left hip shows a femoral head fracture and possible AVN, MRI of the left hip shows possible AVN, A consult was requested and obtained from this/these consultant(s) Orthopedics, Notes from prior ED visits and [place] Controlled Substance Database  ____________________________________________   FINAL CLINICAL IMPRESSION(S) / ED DIAGNOSES  Final diagnoses:  Avascular necrosis of left femur (HCC)  Closed fracture of head of left femur, initial encounter ([place])      NEW MEDICATIONS STARTED DURING THIS VISIT:  New Prescriptions   METHYLPREDNISOLONE (MEDROL DOSEPAK) 4 MG TBPK TABLET    Take 6 pills on day one then decrease by 1 pill each day   OXYCODONE-ACETAMINOPHEN (PERCOCET/ROXICET) 5-325 MG TABLET    Take 1 tablet by mouth every 4 (four) hours as needed for severe pain.     Note:  This document was prepared using Dragon voice recognition software and may include unintentional dictation errors.    Versie Starks, PA-C 03/10/18 1542    Earleen Newport, MD 03/16/18 [phone]

## 2018-04-02 ENCOUNTER — Other Ambulatory Visit: Payer: BLUE CROSS/BLUE SHIELD

## 2018-04-03 ENCOUNTER — Encounter
Admission: RE | Admit: 2018-04-03 | Discharge: 2018-04-03 | Disposition: A | Discharge status: Home / Self Care | Payer: BLUE CROSS/BLUE SHIELD | Source: Ambulatory Visit | Attending: Orthopedic Surgery | Admitting: Orthopedic Surgery

## 2018-04-03 ENCOUNTER — Other Ambulatory Visit: Payer: Self-pay

## 2018-04-03 DIAGNOSIS — Z01810 Encounter for preprocedural cardiovascular examination: Secondary | ICD-10-CM | POA: Diagnosis present

## 2018-04-03 DIAGNOSIS — Z01812 Encounter for preprocedural laboratory examination: Secondary | ICD-10-CM | POA: Insufficient documentation

## 2018-04-03 HISTORY — DX: Unilateral primary osteoarthritis, left hip: M16.12

## 2018-04-03 LAB — TYPE AND SCREEN
ABO/RH(D): A NEG
ANTIBODY SCREEN: NEGATIVE

## 2018-04-03 LAB — BASIC METABOLIC PANEL
ANION GAP: 9 (ref 5–15)
BUN: 18 mg/dL (ref 6–20)
CHLORIDE: 101 mmol/L (ref 101–111)
CO2: 27 mmol/L (ref 22–32)
Calcium: 9.7 mg/dL (ref 8.9–10.3)
Creatinine, Ser: 0.91 mg/dL (ref 0.44–1.00)
GFR calc non Af Amer: >60 mL/min (ref >60)
Glucose, Bld: 73 mg/dL (ref 65–99)
POTASSIUM: 4.2 mmol/L (ref 3.5–5.1)
SODIUM: 137 mmol/L (ref 135–145)

## 2018-04-03 LAB — CBC
HEMATOCRIT: 44.4 % (ref 35.0–47.0)
HEMOGLOBIN: 14.8 g/dL (ref 12.0–16.0)
MCH: 29.6 pg (ref 26.0–34.0)
MCHC: 33.3 g/dL (ref 32.0–36.0)
MCV: 88.8 fL (ref 80.0–100.0)
Platelets: 346 10*3/uL (ref 150–440)
RBC: 5.01 MIL/uL (ref 3.80–5.20)
RDW: 14.9 % — ABNORMAL HIGH (ref 11.5–14.5)
WBC: 8.8 10*3/uL (ref 3.6–11.0)

## 2018-04-03 LAB — URINALYSIS, ROUTINE W REFLEX MICROSCOPIC
Bacteria, UA: NONE SEEN
Bilirubin Urine: NEGATIVE
GLUCOSE, UA: NEGATIVE mg/dL
Ketones, ur: NEGATIVE mg/dL
Leukocytes, UA: NEGATIVE
NITRITE: NEGATIVE
Protein, ur: NEGATIVE mg/dL
SPECIFIC GRAVITY, URINE: 1.012 (ref 1.005–1.030)
pH: 6 (ref 5.0–8.0)

## 2018-04-03 LAB — PROTIME-INR
INR: 0.9
PROTHROMBIN TIME: 12.1 s (ref 11.4–15.2)

## 2018-04-03 LAB — APTT: APTT: 29 s (ref 24–36)

## 2018-04-03 LAB — SURGICAL PCR SCREEN
MRSA, PCR: NEGATIVE
Staphylococcus aureus: NEGATIVE

## 2018-04-03 LAB — SEDIMENTATION RATE: Sed Rate: 8 mm/hr (ref 0–30)

## 2018-04-03 NOTE — Patient Instructions (Signed)
Your procedure is scheduled on: Tuesday, April 09, 2018 Report [facility] To find out your arrival time, please call [phone] between 1PM - 3PM on: Monday, April 08, 2018  REMEMBER: Instructions that are not followed completely may result in serious medical risk, up to and including death; or upon the discretion of your surgeon and anesthesiologist your surgery may need to be rescheduled.  Do not eat food after midnight the night before your procedure.  No gum chewing, lozengers or hard candies.  You may however, drink CLEAR liquids up to 2 hours before you are scheduled to arrive for your surgery. Do not drink anything within 2 hours of the start of your surgery.  Clear liquids include: - water  - apple juice without pulp - clear gatorade - black coffee or tea (Do NOT add anything to the coffee or tea) Do NOT drink anything that is not on this list.  No Alcohol for 24 hours before or after surgery.  No Smoking including e-cigarettes for 24 hours prior to surgery.  No chewable tobacco products for at least 6 hours prior to surgery.  No nicotine patches on the day of surgery.  On the morning of surgery brush your teeth with toothpaste and water, you may rinse your mouth with mouthwash if you wish. Do not swallow any toothpaste or mouthwash.  Notify your doctor if there is any change in your medical condition (cold, fever, infection).  Do not wear jewelry, make-up, hairpins, clips or nail polish.  Do not wear lotions, powders, or perfumes. You may wear deodorant.  Do not shave 48 hours prior to surgery.  Contacts and dentures may not be worn into surgery.  Do not bring valuables to the hospital, including drivers license, insurance or credit cards.  [facility] is not responsible for any belongings or valuables.   TAKE THESE MEDICATIONS THE MORNING OF SURGERY:  1.  AMLODIPINE 2.  DULOXETINE 3.  GABAPENTIN 4.  PAROXETINE  Use  CHG Soap as directed on instruction sheet.  NOW!  Stop Anti-inflammatories (NSAIDS) such as DICLOFENAC, Advil, Aleve, Ibuprofen, Motrin, Naproxen, Naprosyn and Aspirin based products such as Excedrin, Goodys Powder, BC Powder. (May take TRAMADOL OR Tylenol or Acetaminophen if needed.)  NOW!  Stop ANY OVER THE COUNTER supplements until after surgery. (FISH OIL) (May continue Vitamin D and multivitamin.)  Wear comfortable clothing (specific to your surgery type) to the hospital.  Plan for stool softeners for home use.  If you are being admitted to the hospital overnight, leave your suitcase in the car. After surgery it may be brought to your room.  Please call [phone] if you have any questions about these instructions.

## 2018-04-04 LAB — URINE CULTURE

## 2018-04-04 NOTE — Pre-Procedure Instructions (Signed)
EKG READ BY DR KARENZ AND OK 

## 2018-04-09 ENCOUNTER — Inpatient Hospital Stay: Payer: BLUE CROSS/BLUE SHIELD

## 2018-04-09 ENCOUNTER — Other Ambulatory Visit: Payer: Self-pay

## 2018-04-09 ENCOUNTER — Inpatient Hospital Stay
Admission: RE | Admit: 2018-04-09 | Discharge: 2018-04-12 | DRG: 470 | Disposition: A | Discharge status: Home / Self Care | Payer: BLUE CROSS/BLUE SHIELD | Attending: Orthopedic Surgery | Admitting: Orthopedic Surgery

## 2018-04-09 ENCOUNTER — Inpatient Hospital Stay: Payer: BLUE CROSS/BLUE SHIELD | Admitting: Anesthesiology

## 2018-04-09 ENCOUNTER — Encounter
Admission: RE | Disposition: A | Discharge status: Home / Self Care | Payer: Self-pay | Source: Home / Self Care | Attending: Orthopedic Surgery

## 2018-04-09 DIAGNOSIS — M87052 Idiopathic aseptic necrosis of left femur: Secondary | ICD-10-CM | POA: Diagnosis present

## 2018-04-09 DIAGNOSIS — M879 Osteonecrosis, unspecified: Secondary | ICD-10-CM | POA: Diagnosis present

## 2018-04-09 DIAGNOSIS — Z9884 Bariatric surgery status: Secondary | ICD-10-CM

## 2018-04-09 DIAGNOSIS — Z880 Allergy status to penicillin: Secondary | ICD-10-CM | POA: Diagnosis not present

## 2018-04-09 DIAGNOSIS — Z79899 Other long term (current) drug therapy: Secondary | ICD-10-CM

## 2018-04-09 DIAGNOSIS — M1612 Unilateral primary osteoarthritis, left hip: Secondary | ICD-10-CM | POA: Diagnosis present

## 2018-04-09 DIAGNOSIS — F419 Anxiety disorder, unspecified: Secondary | ICD-10-CM | POA: Diagnosis present

## 2018-04-09 DIAGNOSIS — Z419 Encounter for procedure for purposes other than remedying health state, unspecified: Secondary | ICD-10-CM

## 2018-04-09 DIAGNOSIS — G8918 Other acute postprocedural pain: Secondary | ICD-10-CM

## 2018-04-09 DIAGNOSIS — Z6841 Body Mass Index (BMI) 40.0 and over, adult: Secondary | ICD-10-CM

## 2018-04-09 DIAGNOSIS — I10 Essential (primary) hypertension: Secondary | ICD-10-CM | POA: Diagnosis present

## 2018-04-09 DIAGNOSIS — M797 Fibromyalgia: Secondary | ICD-10-CM | POA: Diagnosis present

## 2018-04-09 HISTORY — DX: Idiopathic aseptic necrosis of left femur: M87.052

## 2018-04-09 HISTORY — PX: TOTAL HIP ARTHROPLASTY: SHX124

## 2018-04-09 LAB — CBC
HEMATOCRIT: 41 % (ref 35.0–47.0)
Hemoglobin: 13.6 g/dL (ref 12.0–16.0)
MCH: 29.4 pg (ref 26.0–34.0)
MCHC: 33.2 g/dL (ref 32.0–36.0)
MCV: 88.3 fL (ref 80.0–100.0)
Platelets: 305 10*3/uL (ref 150–440)
RBC: 4.64 MIL/uL (ref 3.80–5.20)
RDW: 14.1 % (ref 11.5–14.5)
WBC: 14.5 10*3/uL — AB (ref 3.6–11.0)

## 2018-04-09 LAB — URINE DRUG SCREEN, QUALITATIVE (ARMC ONLY)
Amphetamines, Ur Screen: NOT DETECTED
Benzodiazepine, Ur Scrn: NOT DETECTED
CANNABINOID 50 NG, UR ~~LOC~~: POSITIVE — AB
COCAINE METABOLITE, UR ~~LOC~~: NOT DETECTED
MDMA (Ecstasy)Ur Screen: NOT DETECTED
Methadone Scn, Ur: NOT DETECTED
OPIATE, UR SCREEN: NOT DETECTED
PHENCYCLIDINE (PCP) UR S: NOT DETECTED
TRICYCLIC, UR SCREEN: NOT DETECTED

## 2018-04-09 LAB — ABO/RH: ABO/RH(D): A NEG

## 2018-04-09 LAB — POCT PREGNANCY, URINE: Preg Test, Ur: NEGATIVE

## 2018-04-09 LAB — CREATININE, SERUM
Creatinine, Ser: 0.9 mg/dL (ref 0.44–1.00)
GFR calc Af Amer: >60 mL/min (ref >60)
GFR calc non Af Amer: >60 mL/min (ref >60)

## 2018-04-09 SURGERY — ARTHROPLASTY, HIP, TOTAL, ANTERIOR APPROACH
Anesthesia: General | Laterality: Left

## 2018-04-09 MED ORDER — PROPOFOL 500 MG/50ML IV EMUL
INTRAVENOUS | Status: AC
  Filled 2018-04-09: qty 50

## 2018-04-09 MED ORDER — ONDANSETRON HCL 4 MG PO TABS: 4.0000 mg | ORAL_TABLET | Freq: Four times a day (QID) | ORAL | Status: DC | PRN

## 2018-04-09 MED ORDER — ONDANSETRON HCL 4 MG/2ML IJ SOLN
4.0000 mg | Freq: Four times a day (QID) | INTRAMUSCULAR | Status: DC | PRN
Start: 2018-04-09 — End: 2018-04-12

## 2018-04-09 MED ORDER — FENTANYL CITRATE (PF) 100 MCG/2ML IJ SOLN
INTRAMUSCULAR | Status: AC
  Filled 2018-04-09: qty 2

## 2018-04-09 MED ORDER — HYDROMORPHONE HCL 1 MG/ML IJ SOLN
INTRAMUSCULAR | Status: AC
  Administered 2018-04-09: 0.5 mg via INTRAVENOUS
  Filled 2018-04-09: qty 1

## 2018-04-09 MED ORDER — LISINOPRIL-HYDROCHLOROTHIAZIDE 20-25 MG PO TABS: 1.0000 | ORAL_TABLET | Freq: Every day | ORAL | Status: DC

## 2018-04-09 MED ORDER — DULOXETINE HCL 60 MG PO CPEP
60.0000 mg | ORAL_CAPSULE | Freq: Every day | ORAL | Status: DC
  Administered 2018-04-10 – 2018-04-12 (×3): 60 mg via ORAL
  Filled 2018-04-09 (×3): qty 1

## 2018-04-09 MED ORDER — DIPHENHYDRAMINE HCL 12.5 MG/5ML PO ELIX: 12.5000 mg | ORAL_SOLUTION | ORAL | Status: DC | PRN

## 2018-04-09 MED ORDER — PAROXETINE HCL 20 MG PO TABS
20.0000 mg | ORAL_TABLET | Freq: Every day | ORAL | Status: DC
  Administered 2018-04-09 – 2018-04-12 (×4): 20 mg via ORAL
  Filled 2018-04-09 (×4): qty 1

## 2018-04-09 MED ORDER — HYDROMORPHONE HCL 1 MG/ML IJ SOLN
0.2500 mg | INTRAMUSCULAR | Status: DC | PRN
  Administered 2018-04-09 (×2): 0.5 mg via INTRAVENOUS

## 2018-04-09 MED ORDER — ADULT MULTIVITAMIN W/MINERALS CH
ORAL_TABLET | Freq: Every day | ORAL | Status: DC
  Administered 2018-04-09 – 2018-04-11 (×3): 1 via ORAL
  Administered 2018-04-12: 09:00:00 via ORAL
  Filled 2018-04-09 (×4): qty 1

## 2018-04-09 MED ORDER — SODIUM CHLORIDE 0.9 % IV SOLN
INTRAVENOUS | Status: DC
  Administered 2018-04-09: 20:00:00 via INTRAVENOUS

## 2018-04-09 MED ORDER — BUPIVACAINE-EPINEPHRINE 0.25% -1:200000 IJ SOLN
INTRAMUSCULAR | Status: DC | PRN
  Administered 2018-04-09: 30 mL

## 2018-04-09 MED ORDER — OMEGA-3-ACID ETHYL ESTERS 1 G PO CAPS
1000.0000 mg | ORAL_CAPSULE | Freq: Every morning | ORAL | Status: DC
  Administered 2018-04-10 – 2018-04-12 (×3): 1000 mg via ORAL
  Filled 2018-04-09 (×2): qty 1

## 2018-04-09 MED ORDER — FENTANYL CITRATE (PF) 100 MCG/2ML IJ SOLN
INTRAMUSCULAR | Status: DC | PRN
  Administered 2018-04-09: 25 ug via INTRAVENOUS
  Administered 2018-04-09 (×2): 50 ug via INTRAVENOUS
  Administered 2018-04-09: 25 ug via INTRAVENOUS
  Administered 2018-04-09: 100 ug via INTRAVENOUS
  Administered 2018-04-09: 50 ug via INTRAVENOUS

## 2018-04-09 MED ORDER — MIDAZOLAM HCL 2 MG/2ML IJ SOLN
INTRAMUSCULAR | Status: AC
  Filled 2018-04-09: qty 2

## 2018-04-09 MED ORDER — ACETAMINOPHEN 10 MG/ML IV SOLN
INTRAVENOUS | Status: AC
  Filled 2018-04-09: qty 100

## 2018-04-09 MED ORDER — FAMOTIDINE 20 MG PO TABS
ORAL_TABLET | ORAL | Status: AC
  Administered 2018-04-09: 20 mg via ORAL
  Filled 2018-04-09: qty 1

## 2018-04-09 MED ORDER — ONDANSETRON HCL 4 MG/2ML IJ SOLN
INTRAMUSCULAR | Status: DC | PRN
  Administered 2018-04-09: 4 mg via INTRAVENOUS

## 2018-04-09 MED ORDER — LACTATED RINGERS IV SOLN
INTRAVENOUS | Status: DC
  Administered 2018-04-09 (×2): via INTRAVENOUS

## 2018-04-09 MED ORDER — DOCUSATE SODIUM 100 MG PO CAPS
100.0000 mg | ORAL_CAPSULE | Freq: Two times a day (BID) | ORAL | Status: DC
  Administered 2018-04-09 – 2018-04-12 (×6): 100 mg via ORAL
  Filled 2018-04-09 (×6): qty 1

## 2018-04-09 MED ORDER — ACETAMINOPHEN 500 MG PO TABS
500.0000 mg | ORAL_TABLET | Freq: Four times a day (QID) | ORAL | Status: AC
  Administered 2018-04-09 – 2018-04-10 (×3): 500 mg via ORAL
  Filled 2018-04-09 (×3): qty 1

## 2018-04-09 MED ORDER — PROMETHAZINE HCL 25 MG/ML IJ SOLN: 6.2500 mg | INTRAMUSCULAR | Status: DC | PRN

## 2018-04-09 MED ORDER — MIDAZOLAM HCL 5 MG/5ML IJ SOLN
INTRAMUSCULAR | Status: DC | PRN
  Administered 2018-04-09: 2 mg via INTRAVENOUS

## 2018-04-09 MED ORDER — PHENOL 1.4 % MT LIQD
1.0000 | OROMUCOSAL | Status: DC | PRN
  Filled 2018-04-09: qty 177

## 2018-04-09 MED ORDER — ACETAMINOPHEN 325 MG PO TABS
325.0000 mg | ORAL_TABLET | Freq: Four times a day (QID) | ORAL | Status: DC | PRN
  Administered 2018-04-11 – 2018-04-12 (×2): 650 mg via ORAL
  Filled 2018-04-09 (×2): qty 2

## 2018-04-09 MED ORDER — METOCLOPRAMIDE HCL 10 MG PO TABS: 5.0000 mg | ORAL_TABLET | Freq: Three times a day (TID) | ORAL | Status: DC | PRN

## 2018-04-09 MED ORDER — HYDROCODONE-ACETAMINOPHEN 7.5-325 MG PO TABS
1.0000 | ORAL_TABLET | ORAL | Status: DC | PRN
  Administered 2018-04-09: 1 via ORAL
  Administered 2018-04-10: 2 via ORAL
  Administered 2018-04-10: 1 via ORAL
  Administered 2018-04-10: 2 via ORAL
  Administered 2018-04-11: 1 via ORAL
  Filled 2018-04-09: qty 1
  Filled 2018-04-09: qty 2
  Filled 2018-04-09: qty 1
  Filled 2018-04-09 (×2): qty 2

## 2018-04-09 MED ORDER — SUGAMMADEX SODIUM 500 MG/5ML IV SOLN
INTRAVENOUS | Status: AC
  Filled 2018-04-09: qty 5

## 2018-04-09 MED ORDER — ALUM & MAG HYDROXIDE-SIMETH 200-200-20 MG/5ML PO SUSP: 30.0000 mL | ORAL | Status: DC | PRN

## 2018-04-09 MED ORDER — SUCCINYLCHOLINE CHLORIDE 20 MG/ML IJ SOLN
INTRAMUSCULAR | Status: DC | PRN
  Administered 2018-04-09: 120 mg via INTRAVENOUS

## 2018-04-09 MED ORDER — LIDOCAINE HCL (CARDIAC) PF 100 MG/5ML IV SOSY
PREFILLED_SYRINGE | INTRAVENOUS | Status: DC | PRN
  Administered 2018-04-09: 100 mg via INTRAVENOUS

## 2018-04-09 MED ORDER — HYDROCODONE-ACETAMINOPHEN 5-325 MG PO TABS
1.0000 | ORAL_TABLET | ORAL | Status: DC | PRN
  Administered 2018-04-10 – 2018-04-12 (×5): 2 via ORAL
  Filled 2018-04-09 (×5): qty 2

## 2018-04-09 MED ORDER — BISACODYL 5 MG PO TBEC: 5.0000 mg | DELAYED_RELEASE_TABLET | Freq: Every day | ORAL | Status: DC | PRN

## 2018-04-09 MED ORDER — CLINDAMYCIN PHOSPHATE 900 MG/50ML IV SOLN
900.0000 mg | Freq: Once | INTRAVENOUS | Status: AC
  Administered 2018-04-09: 900 mg via INTRAVENOUS

## 2018-04-09 MED ORDER — TRAMADOL HCL 50 MG PO TABS
50.0000 mg | ORAL_TABLET | Freq: Four times a day (QID) | ORAL | Status: DC
  Administered 2018-04-09 – 2018-04-12 (×11): 50 mg via ORAL
  Filled 2018-04-09 (×11): qty 1

## 2018-04-09 MED ORDER — ROCURONIUM BROMIDE 100 MG/10ML IV SOLN
INTRAVENOUS | Status: DC | PRN
  Administered 2018-04-09: 40 mg via INTRAVENOUS
  Administered 2018-04-09: 10 mg via INTRAVENOUS

## 2018-04-09 MED ORDER — TIZANIDINE HCL 2 MG PO TABS
2.0000 mg | ORAL_TABLET | Freq: Two times a day (BID) | ORAL | Status: DC | PRN
Start: 2018-04-09 — End: 2018-04-12
  Administered 2018-04-12: 4 mg via ORAL
  Filled 2018-04-09 (×2): qty 2

## 2018-04-09 MED ORDER — HYDROMORPHONE HCL 1 MG/ML IJ SOLN
0.2500 mg | INTRAMUSCULAR | Status: DC | PRN
  Administered 2018-04-09 (×4): 0.5 mg via INTRAVENOUS

## 2018-04-09 MED ORDER — PROPOFOL 10 MG/ML IV BOLUS
INTRAVENOUS | Status: AC
  Filled 2018-04-09: qty 20

## 2018-04-09 MED ORDER — NEOMYCIN-POLYMYXIN B GU 40-200000 IR SOLN
Status: AC
  Filled 2018-04-09: qty 4

## 2018-04-09 MED ORDER — LISINOPRIL 20 MG PO TABS
20.0000 mg | ORAL_TABLET | Freq: Every day | ORAL | Status: DC
  Administered 2018-04-09 – 2018-04-12 (×3): 20 mg via ORAL
  Filled 2018-04-09 (×4): qty 1

## 2018-04-09 MED ORDER — METOCLOPRAMIDE HCL 5 MG/ML IJ SOLN: 5.0000 mg | Freq: Three times a day (TID) | INTRAMUSCULAR | Status: DC | PRN

## 2018-04-09 MED ORDER — ONDANSETRON HCL 4 MG/2ML IJ SOLN
INTRAMUSCULAR | Status: AC
  Filled 2018-04-09: qty 2

## 2018-04-09 MED ORDER — CLONAZEPAM 0.5 MG PO TABS
0.5000 mg | ORAL_TABLET | Freq: Three times a day (TID) | ORAL | Status: DC | PRN
  Administered 2018-04-10 – 2018-04-11 (×2): 0.5 mg via ORAL
  Filled 2018-04-09 (×2): qty 1

## 2018-04-09 MED ORDER — GABAPENTIN 300 MG PO CAPS
600.0000 mg | ORAL_CAPSULE | Freq: Three times a day (TID) | ORAL | Status: DC
  Administered 2018-04-09 – 2018-04-12 (×8): 600 mg via ORAL
  Filled 2018-04-09 (×8): qty 2

## 2018-04-09 MED ORDER — AMLODIPINE BESYLATE 10 MG PO TABS
10.0000 mg | ORAL_TABLET | Freq: Every day | ORAL | Status: DC
  Administered 2018-04-11 – 2018-04-12 (×2): 10 mg via ORAL
  Filled 2018-04-09 (×3): qty 1

## 2018-04-09 MED ORDER — PROPOFOL 10 MG/ML IV BOLUS
INTRAVENOUS | Status: DC | PRN
  Administered 2018-04-09: 200 mg via INTRAVENOUS
  Administered 2018-04-09: 20 mg via INTRAVENOUS
  Administered 2018-04-09 (×2): 30 mg via INTRAVENOUS

## 2018-04-09 MED ORDER — DEXAMETHASONE SODIUM PHOSPHATE 10 MG/ML IJ SOLN
INTRAMUSCULAR | Status: AC
  Filled 2018-04-09: qty 1

## 2018-04-09 MED ORDER — CALCIUM CARBONATE-VITAMIN D 500-200 MG-UNIT PO TABS
ORAL_TABLET | Freq: Every day | ORAL | Status: DC
  Administered 2018-04-10 – 2018-04-11 (×2): 1 via ORAL
  Administered 2018-04-12: 09:00:00 via ORAL
  Filled 2018-04-09 (×2): qty 1

## 2018-04-09 MED ORDER — FENTANYL CITRATE (PF) 100 MCG/2ML IJ SOLN
INTRAMUSCULAR | Status: AC
  Administered 2018-04-09: 50 ug via INTRAVENOUS
  Filled 2018-04-09: qty 2

## 2018-04-09 MED ORDER — SENNOSIDES-DOCUSATE SODIUM 8.6-50 MG PO TABS
1.0000 | ORAL_TABLET | Freq: Every evening | ORAL | Status: DC | PRN
  Administered 2018-04-10: 1 via ORAL
  Filled 2018-04-09: qty 1

## 2018-04-09 MED ORDER — FENTANYL CITRATE (PF) 100 MCG/2ML IJ SOLN
INTRAMUSCULAR | Status: AC
Start: 2018-04-09 — End: 2018-04-09
  Administered 2018-04-09: 50 ug via INTRAVENOUS
  Filled 2018-04-09: qty 2

## 2018-04-09 MED ORDER — MORPHINE SULFATE (PF) 2 MG/ML IV SOLN: 0.5000 mg | INTRAVENOUS | Status: DC | PRN

## 2018-04-09 MED ORDER — FAMOTIDINE 20 MG PO TABS
20.0000 mg | ORAL_TABLET | Freq: Once | ORAL | Status: AC
  Administered 2018-04-09: 20 mg via ORAL

## 2018-04-09 MED ORDER — ACETAMINOPHEN 10 MG/ML IV SOLN
INTRAVENOUS | Status: DC | PRN
  Administered 2018-04-09: 1000 mg via INTRAVENOUS

## 2018-04-09 MED ORDER — MAGNESIUM CITRATE PO SOLN: 1.0000 | Freq: Once | ORAL | Status: DC | PRN

## 2018-04-09 MED ORDER — SODIUM CHLORIDE 0.9 % IV SOLN
1000.0000 mg | INTRAVENOUS | Status: DC
  Filled 2018-04-09: qty 10

## 2018-04-09 MED ORDER — DEXAMETHASONE SODIUM PHOSPHATE 10 MG/ML IJ SOLN
INTRAMUSCULAR | Status: DC | PRN
  Administered 2018-04-09: 8 mg via INTRAVENOUS

## 2018-04-09 MED ORDER — SUGAMMADEX SODIUM 500 MG/5ML IV SOLN
INTRAVENOUS | Status: DC | PRN
  Administered 2018-04-09: 220 mg via INTRAVENOUS

## 2018-04-09 MED ORDER — NEOMYCIN-POLYMYXIN B GU 40-200000 IR SOLN
Status: DC | PRN
  Administered 2018-04-09: 4 mL

## 2018-04-09 MED ORDER — CLINDAMYCIN PHOSPHATE 900 MG/50ML IV SOLN
INTRAVENOUS | Status: AC
  Filled 2018-04-09: qty 50

## 2018-04-09 MED ORDER — ZOLPIDEM TARTRATE 5 MG PO TABS: 5.0000 mg | ORAL_TABLET | Freq: Every evening | ORAL | Status: DC | PRN

## 2018-04-09 MED ORDER — FENTANYL CITRATE (PF) 100 MCG/2ML IJ SOLN
25.0000 ug | INTRAMUSCULAR | Status: DC | PRN
  Administered 2018-04-09 (×3): 50 ug via INTRAVENOUS

## 2018-04-09 MED ORDER — MENTHOL 3 MG MT LOZG
1.0000 | LOZENGE | OROMUCOSAL | Status: DC | PRN
  Filled 2018-04-09: qty 9

## 2018-04-09 MED ORDER — HYDROCHLOROTHIAZIDE 25 MG PO TABS
25.0000 mg | ORAL_TABLET | Freq: Every day | ORAL | Status: DC
  Administered 2018-04-11 – 2018-04-12 (×2): 25 mg via ORAL
  Filled 2018-04-09 (×4): qty 1

## 2018-04-09 MED ORDER — ENOXAPARIN SODIUM 40 MG/0.4ML ~~LOC~~ SOLN
40.0000 mg | SUBCUTANEOUS | Status: DC
  Administered 2018-04-10 – 2018-04-11 (×2): 40 mg via SUBCUTANEOUS
  Filled 2018-04-09 (×2): qty 0.4

## 2018-04-09 MED ORDER — CLINDAMYCIN PHOSPHATE 900 MG/50ML IV SOLN
900.0000 mg | Freq: Four times a day (QID) | INTRAVENOUS | Status: AC
  Administered 2018-04-09 – 2018-04-10 (×3): 900 mg via INTRAVENOUS
  Filled 2018-04-09 (×3): qty 50

## 2018-04-09 SURGICAL SUPPLY — 55 items
BLADE SAGITTAL AGGR TOOTH XLG (BLADE) ×3 IMPLANT
BNDG COHESIVE 6X5 TAN STRL LF (GAUZE/BANDAGES/DRESSINGS) ×9 IMPLANT
CANISTER SUCT 1200ML W/VALVE (MISCELLANEOUS) ×3 IMPLANT
CHLORAPREP W/TINT 26ML (MISCELLANEOUS) ×3 IMPLANT
DRAPE C-ARM XRAY 36X54 (DRAPES) ×3 IMPLANT
DRAPE INCISE IOBAN 66X60 STRL (DRAPES) ×3 IMPLANT
DRAPE POUCH INSTRU U-SHP 10X18 (DRAPES) ×3 IMPLANT
DRAPE SHEET LG 3/4 BI-LAMINATE (DRAPES) ×9 IMPLANT
DRAPE TABLE BACK 80X90 (DRAPES) ×3 IMPLANT
DRESSING SURGICEL FIBRLLR 1X2 (HEMOSTASIS) ×2 IMPLANT
DRSG OPSITE POSTOP 4X8 (GAUZE/BANDAGES/DRESSINGS) ×6 IMPLANT
DRSG SURGICEL FIBRILLAR 1X2 (HEMOSTASIS) ×6
ELECT BLADE 6.5 EXT (BLADE) ×3 IMPLANT
ELECT REM PT RETURN 9FT ADLT (ELECTROSURGICAL) ×3
ELECTRODE REM PT RTRN 9FT ADLT (ELECTROSURGICAL) ×1 IMPLANT
GLOVE BIOGEL PI IND STRL 9 (GLOVE) ×1 IMPLANT
GLOVE BIOGEL PI INDICATOR 9 (GLOVE) ×2
GLOVE SURG SYN 9.0  PF PI (GLOVE) ×4
GLOVE SURG SYN 9.0 PF PI (GLOVE) ×2 IMPLANT
GOWN SRG 2XL LVL 4 RGLN SLV (GOWNS) ×1 IMPLANT
GOWN STRL NON-REIN 2XL LVL4 (GOWNS) ×2
GOWN STRL REUS W/ TWL LRG LVL3 (GOWN DISPOSABLE) ×1 IMPLANT
GOWN STRL REUS W/TWL LRG LVL3 (GOWN DISPOSABLE) ×2
HEAD FEMORAL 28MM SZ S (Head) ×3 IMPLANT
HEMOVAC 400CC 10FR (MISCELLANEOUS) ×3 IMPLANT
HOLDER FOLEY CATH W/STRAP (MISCELLANEOUS) ×3 IMPLANT
HOOD PEEL AWAY FLYTE STAYCOOL (MISCELLANEOUS) ×3 IMPLANT
KIT PREVENA INCISION MGT 13 (CANNISTER) ×3 IMPLANT
LINER DBL MOB SZ 0 52MM (Liner) ×3 IMPLANT
MAT BLUE FLOOR 46X72 FLO (MISCELLANEOUS) ×3 IMPLANT
NDL SAFETY ECLIPSE 18X1.5 (NEEDLE) ×1 IMPLANT
NEEDLE HYPO 18GX1.5 SHARP (NEEDLE) ×2
NEEDLE SPNL 18GX3.5 QUINCKE PK (NEEDLE) ×3 IMPLANT
NS IRRIG 1000ML POUR BTL (IV SOLUTION) ×3 IMPLANT
PACK HIP COMPR (MISCELLANEOUS) ×3 IMPLANT
SHELL ACETABULAR SZ 52 DM (Shell) ×3 IMPLANT
SOL PREP PVP 2OZ (MISCELLANEOUS) ×3
SOLUTION PREP PVP 2OZ (MISCELLANEOUS) ×1 IMPLANT
SPONGE DRAIN TRACH 4X4 STRL 2S (GAUZE/BANDAGES/DRESSINGS) ×3 IMPLANT
STAPLER SKIN PROX 35W (STAPLE) ×3 IMPLANT
STEM FEMORAL SZ2 STD COLLARED (Stem) ×3 IMPLANT
STRAP SAFETY 5IN WIDE (MISCELLANEOUS) ×3 IMPLANT
SUT DVC 2 QUILL PDO  T11 36X36 (SUTURE) ×2
SUT DVC 2 QUILL PDO T11 36X36 (SUTURE) ×1 IMPLANT
SUT SILK 0 (SUTURE) ×2
SUT SILK 0 30XBRD TIE 6 (SUTURE) ×1 IMPLANT
SUT V-LOC 90 ABS DVC 3-0 CL (SUTURE) ×3 IMPLANT
SUT VIC AB 1 CT1 36 (SUTURE) ×3 IMPLANT
SYR 20CC LL (SYRINGE) ×3 IMPLANT
SYR 30ML LL (SYRINGE) ×3 IMPLANT
SYR BULB IRRIG 60ML STRL (SYRINGE) ×3 IMPLANT
TAPE MICROFOAM 4IN (TAPE) ×3 IMPLANT
TOWEL OR 17X26 4PK STRL BLUE (TOWEL DISPOSABLE) ×3 IMPLANT
TRAY FOLEY MTR SLVR 16FR STAT (SET/KITS/TRAYS/PACK) ×3 IMPLANT
WND VAC CANISTER 500ML (MISCELLANEOUS) ×3 IMPLANT

## 2018-04-09 NOTE — Anesthesia Procedure Notes (Signed)
Procedure Name: Intubation Date/Time: 04/09/2018 4:06 PM Performed by: Nelda Marseille, CRNA Pre-anesthesia Checklist: Patient identified, Patient being monitored, Timeout performed, Emergency Drugs available and Suction available Patient Re-evaluated:Patient Re-evaluated prior to induction Oxygen Delivery Method: Circle system utilized Preoxygenation: Pre-oxygenation with 100% oxygen Induction Type: IV induction Ventilation: Mask ventilation without difficulty Laryngoscope Size: Mac, 3 and Glidescope Grade View: Grade I Tube type: Oral Tube size: 7.0 mm Number of attempts: 1 Airway Equipment and Method: Stylet Placement Confirmation: ETT inserted through vocal cords under direct vision,  positive ETCO2 and breath sounds checked- equal and bilateral Secured at: 21 cm Tube secured with: Tape Dental Injury: Teeth and Oropharynx as per pre-operative assessment

## 2018-04-09 NOTE — Anesthesia Post-op Follow-up Note (Signed)
Anesthesia QCDR form completed.        

## 2018-04-09 NOTE — Anesthesia Postprocedure Evaluation (Signed)
Anesthesia Post Note  Patient: Terri Wood  Procedure(s) Performed: TOTAL HIP ARTHROPLASTY ANTERIOR APPROACH (Left )  Patient location during evaluation: PACU Anesthesia Type: General Level of consciousness: awake and alert Pain management: pain level controlled Vital Signs Assessment: post-procedure vital signs reviewed and stable Respiratory status: spontaneous breathing and respiratory function stable Cardiovascular status: stable Anesthetic complications: no     Last Vitals:  Vitals:   04/09/18 1845 04/09/18 1855  BP:  (!) 156/91  Pulse: 97 94  Resp: 14 14  Temp:    SpO2: 97% 98%    Last Pain:  Vitals:   04/09/18 1855  TempSrc:   PainSc: 8                  KEPHART,WILLIAM K

## 2018-04-09 NOTE — Op Note (Signed)
04/09/2018  5:43 PM  PATIENT:  Terri Wood  52 y.o. female  PRE-OPERATIVE DIAGNOSIS: Avascular necrosis OF LEFT femoral head  POST-OPERATIVE DIAGNOSIS: Same PROCEDURE:  Procedure(s): TOTAL HIP ARTHROPLASTY ANTERIOR APPROACH (Left)  SURGEON: Leitha SchullerMichael J , MD  ASSISTANTS: None  ANESTHESIA:   general  EBL:  Total I/O In: 1300 [I.V.:1300] Out: 500 [Blood:500]  BLOOD ADMINISTERED:none  DRAINS: (To) Hemovact drain(s) in the Subcutaneous layer with  Suction Open   LOCAL MEDICATIONS USED:  MARCAINE     SPECIMEN:  Source of Specimen:  Left femoral head  DISPOSITION OF SPECIMEN:  PATHOLOGY  COUNTS:  YES  TOURNIQUET:  * No tourniquets in log *  IMPLANTS: Medacta AMIS 2 standard stem with 52 mm Mpact DM cup and liner, ceramic S 28 mm head  DICTATION: .Dragon Dictation   The patient was brought to the operating room and after general anesthesia was obtained patient was placed on the operative table with the ipsilateral foot into the Medacta attachment, contralateral leg on a well-padded table. C-arm was brought in and preop template x-ray taken. After prepping and draping in usual sterile fashion appropriate patient identification and timeout procedures were completed. Anterior approach to the hip was obtained and centered over the greater trochanter and TFL muscle.  Because of the patient's large size a bikini type incision was made.  The subcutaneous tissue was incised hemostasis being achieved by electrocautery. TFL fascia was incised and the muscle retracted laterally deep retractor placed. The lateral femoral circumflex vessels were identified and ligated. The anterior capsule was exposed and a capsulotomy performed. The neck was identified and a femoral neck cut carried out with a saw. The head was removed without difficulty and showed a collapsed femoral head with the portion attached to the ligamentum staying within the acetabulum and then removed and acetabulum showing moderate  wear. Reaming was carried out to 50 mm and a 52 mm cup trial gave appropriate tightness to the acetabular component a 52 DM cup was impacted into position. The leg was then externally rotated and ischiofemoral and pubofemoral releases carried out. The femur was sequentially broached to a size 2, size 2 standard with as had trials were placed and the final components chosen. The 2 standard stem was inserted along with a ceramic S 28 mm head and 52 mm liner. The hip was reduced and was stable the wound was thoroughly irrigated with fibrillar placed along the posterior capsule and medial neck cut. The deep fascia was closed using a heavy Quill after infiltration of 30 cc of quarter percent Sensorcaine with epinephrine. Subcutaneous drains were then inserted, 3-0 v-loc to close the skin with skin staples, incisional wound VAC applied  PLAN OF CARE: Admit to inpatient

## 2018-04-09 NOTE — Anesthesia Preprocedure Evaluation (Signed)
Anesthesia Evaluation  Patient identified by MRN, date of birth, ID band Patient awake    Reviewed: Allergy & Precautions, H&P , NPO status , Patient's Chart, lab work & pertinent test results, reviewed documented beta blocker date and time   History of Anesthesia Complications Negative for: history of anesthetic complications  Airway Mallampati: I  TM Distance: >3 FB Neck ROM: full    Dental  (+) Dental Advidsory Given, Missing, Chipped, Poor Dentition, Teeth Intact   Pulmonary neg shortness of breath, neg sleep apnea, neg COPD, neg recent URI, Current Smoker,           Cardiovascular Exercise Tolerance: Good hypertension, (-) angina(-) CAD, (-) Past MI, (-) Cardiac Stents and (-) CABG (-) dysrhythmias (-) Valvular Problems/Murmurs     Neuro/Psych neg Seizures PSYCHIATRIC DISORDERS Anxiety  Neuromuscular disease (fibromyalgia)    GI/Hepatic Neg liver ROS, GERD  ,  Endo/Other  neg diabetesMorbid obesity  Renal/GU negative Renal ROS  negative genitourinary   Musculoskeletal   Abdominal   Peds  Hematology negative hematology ROS (+)   Anesthesia Other Findings Past Medical History: No date: Anxiety No date: Fibromyalgia No date: Hypertension 2019: Osteoarthritis of left hip   Reproductive/Obstetrics negative OB ROS                             Anesthesia Physical Anesthesia Plan  ASA: III  Anesthesia Plan: Spinal   Post-op Pain Management:    Induction: Intravenous  PONV Risk Score and Plan: 1  Airway Management Planned: Simple Face Mask and Natural Airway  Additional Equipment:   Intra-op Plan:   Post-operative Plan:   Informed Consent: I have reviewed the patients History and Physical, chart, labs and discussed the procedure including the risks, benefits and alternatives for the proposed anesthesia with the patient or authorized representative who has indicated his/her  understanding and acceptance.   Dental Advisory Given  Plan Discussed with: Anesthesiologist, CRNA and Surgeon  Anesthesia Plan Comments:         Anesthesia Quick Evaluation

## 2018-04-09 NOTE — Transfer of Care (Signed)
Immediate Anesthesia Transfer of Care Note  Patient: Terri Wood  Procedure(s) Performed: TOTAL HIP ARTHROPLASTY ANTERIOR APPROACH (Left )  Patient Location: PACU  Anesthesia Type:General  Level of Consciousness: drowsy and patient cooperative  Airway & Oxygen Therapy: Patient Spontanous Breathing and Patient connected to face mask oxygen  Post-op Assessment: Report given to RN and Post -op Vital signs reviewed and stable  Post vital signs: Reviewed and stable  Last Vitals:  Vitals Value Taken Time  BP 162/101 04/09/2018  5:40 PM  Temp    Pulse 97 04/09/2018  5:41 PM  Resp 16 04/09/2018  5:41 PM  SpO2 100 % 04/09/2018  5:41 PM  Vitals shown include unvalidated device data.  Last Pain:  Vitals:   04/09/18 1217  TempSrc: Oral  PainSc: 7          Complications: No apparent anesthesia complications

## 2018-04-09 NOTE — H&P (Signed)
Reviewed paper H+P, will be scanned into chart. No changes noted.  

## 2018-04-10 ENCOUNTER — Encounter: Payer: Self-pay | Admitting: Orthopedic Surgery

## 2018-04-10 MED ORDER — MAGNESIUM HYDROXIDE 400 MG/5ML PO SUSP
15.0000 mL | Freq: Every day | ORAL | Status: DC | PRN
  Administered 2018-04-11: 15 mL via ORAL
  Filled 2018-04-10: qty 30

## 2018-04-10 NOTE — Progress Notes (Signed)
Clinical Social Worker (CSW) received SNF consult. PT is recommending home health. RN case manager aware of above. Please reconsult if future social work needs arise. CSW signing off.    , LCSW [phone] 

## 2018-04-10 NOTE — Progress Notes (Signed)
   Subjective: 1 Day Post-Op Procedure(s) (LRB): TOTAL HIP ARTHROPLASTY ANTERIOR APPROACH (Left) Patient reports pain as 8 on 0-10 scale.   Patient is well, and has had no acute complaints or problems Denies any CP, SOB, ABD pain. We will start therapy today.  Plan is to go Home after hospital stay.  Objective: Vital signs in last 24 hours: Temp:  [97 F (36.1 C)-98.6 F (37 C)] 98 F (36.7 C) (06/26 0425) Pulse Rate:  [77-100] 77 (06/26 0425) Resp:  [12-21] 16 (06/26 0425) BP: (108-188)/(69-104) 119/69 (06/26 0425) SpO2:  [94 %-100 %] 97 % (06/26 0425)  Intake/Output from previous day: 06/25 0701 - 06/26 0700 In: 2303.8 [P.O.:240; I.V.:1823.8; IV Piggyback:240] Out: 2410 [Urine:1900; Drains:10; Blood:500] Intake/Output this shift: No intake/output data recorded.  Recent Labs    04/09/18 2040  HGB 13.6   Recent Labs    04/09/18 2040  WBC 14.5*  RBC 4.64  HCT 41.0  PLT 305   Recent Labs    04/09/18 2040  CREATININE 0.90   No results for input(s): LABPT, INR in the last 72 hours.  EXAM General - Patient is Alert, Appropriate and Oriented Extremity - Neurovascular intact Sensation intact distally Intact pulses distally Dorsiflexion/Plantar flexion intact No cellulitis present Compartment soft Dressing - dressing C/D/I and no drainage.  Wound VAC and Hemovac intact. Motor Function - intact, moving foot and toes well on exam.   Past Medical History:  Diagnosis Date  . Anxiety   . Fibromyalgia   . Hypertension   . Osteoarthritis of left hip 2019    Assessment/Plan:   1 Day Post-Op Procedure(s) (LRB): TOTAL HIP ARTHROPLASTY ANTERIOR APPROACH (Left) Active Problems:   Avascular necrosis of left femoral head (HCC)  Estimated body mass index is 44.43 kg/m as calculated from the following:   Height as of 04/03/18: 5\' 2"  (1.575 m).   Weight as of 04/03/18: 110.2 kg (242 lb 14.4 oz). Advance diet Up with therapy  Needs bowel movement Labs are  stable Recheck labs in the morning Care management to assist with discharge  DVT Prophylaxis - Lovenox, Foot Pumps and TED hose Weight-Bearing as tolerated to left leg   T. Cranston Neighborhris , PA-C [facility] 04/10/2018, 7:26 AM

## 2018-04-10 NOTE — Evaluation (Addendum)
Occupational Therapy Evaluation Patient Details Name: Terri Wood MRN: [id number] DOB: [birth year] Today's Date: 04/10/2018    History of Present Illness Pt is a 52 y.o. female s/p L THA anterior approach 04/09/18 secondary avascular necrosis of L femoral head.  PMH includes htn, (+) smoker, anxiety, fibromyalgia, L hip OA.   Clinical Impression   Met pt lying in bed, agreeable to OT this date. Husband present for OT session. Education given on home safety in preparation for d/c. Educated pt on acquiring a shower seat for the walk in shower (has both, but wants to use walk in for now) to maximize independence and increase safety in BADL. Educated pt on use of sock aide and reacher to maximize functional independence of LB dressing, pt in understanding and in slight interest. Also gave education on acquiring a walker bag for the 2WW to keep cell phone nearby for safety reasons within the home, pt and husband in understanding. Pt appreciative of AE instruction, but states husband will assist with any areas she may have difficulty (husband in agreeance). Husband states he is on disability and home 24/7 and willing to assist with any ADL to maintain safety in the home. Education given to pt and husband on easy donning of TED hoes using plastic bag, pt in understanding and appreciative of instruction. Pt deferred functional t/f and mobility this date after fatigue from 2 PT sessions and arrival of dinner. No further OT needs identified, educated pt to ask if changes arise and in home environment as well. Pt states she is confident her husband's help is enough.    Follow Up Recommendations  No OT follow up    Equipment Recommendations  Tub/shower seat;Other (comment)(sock aide, walker bag)    Recommendations for Other Services       Precautions / Restrictions Precautions Precautions: Fall;Anterior Hip Precaution Booklet Issued: Yes (comment) Restrictions Weight Bearing Restrictions: Yes LLE  Weight Bearing: Weight bearing as tolerated LLE Partial Weight Bearing Percentage or Pounds: 50%      Mobility Bed Mobility Overal bed mobility: Needs Assistance Bed Mobility: Sit to Supine       Sit to supine: Min assist;HOB elevated   General bed mobility comments: assist for L LE; vc's for use of bedrail; increased time to perform  Transfers Overall transfer level: Needs assistance Equipment used: Rolling walker (2 wheeled) Transfers: Sit to/from Stand Sit to Stand: Min assist         General transfer comment: deferred 2/2 fatigue after 2 PT sessions    Balance Overall balance assessment: Needs assistance Sitting-balance support: No upper extremity supported;Feet supported Sitting balance-Leahy Scale: Good Sitting balance - Comments: steady sitting reaching within BOS   Standing balance support: No upper extremity supported Standing balance-Leahy Scale: Fair Standing balance comment: able to stand static no UE support without loss of balance                           ADL either performed or assessed with clinical judgement   ADL Overall ADL's : Needs assistance/impaired Eating/Feeding: Independent   Grooming: Independent   Upper Body Bathing: Minimal assistance;Sitting   Lower Body Bathing: Sit to/from stand;With caregiver independent assisting;Maximal assistance Lower Body Bathing Details (indicate cue type and reason): anticipate max A 2/2 increase of pain and ant hip precautions, wants husbands A Upper Body Dressing : Independent   Lower Body Dressing: Maximal assistance;Sit to/from stand;With caregiver independent assisting   Toilet Transfer: Moderate assistance;RW  Toileting- Clothing Manipulation and Hygiene: Set up   Tub/ Shower Transfer: Moderate assistance;Shower seat;Tub bench     General ADL Comments: pt requests husband assists with LB ADLs     Vision Baseline Vision/History: No visual deficits Patient Visual Report: No change  from baseline       Perception     Praxis      Pertinent Vitals/Pain Pain Assessment: Faces Faces Pain Scale: Hurts a little bit Pain Location: L hip Pain Descriptors / Indicators: Aching;Operative site guarding Pain Intervention(s): Limited activity within patient's tolerance;Monitored during session     Hand Dominance     Extremity/Trunk Assessment Upper Extremity Assessment Upper Extremity Assessment: Generalized weakness   Lower Extremity Assessment Lower Extremity Assessment: RLE deficits/detail;LLE deficits/detail LLE: Unable to fully assess due to pain       Communication Communication Communication: No difficulties   Cognition Arousal/Alertness: Awake/alert Behavior During Therapy: WFL for tasks assessed/performed Overall Cognitive Status: Within Functional Limits for tasks assessed                                     General Comments  L hemovac site dressing without bloody drainage beginning of session but 1/2 way soaked with bloody drainage end of session (pt reports she has been monitoring site and would let nursing know if she noticed more)    Exercises General Exercises - Lower Extremity Long Arc Quad: AROM;Strengthening;Left;10 reps;Seated Hip Flexion/Marching: AAROM;Strengthening;Left;10 reps;Seated   Shoulder Instructions      Home Living Family/patient expects to be discharged to:: Private residence Living Arrangements: Spouse/significant other Available Help at Discharge: Family Type of Home: House Home Access: Stairs to enter [employer] of Steps: 3 steps from back or 5 from front Entrance Stairs-Rails: None Home Layout: One level     Bathroom Shower/Tub: Tub/shower unit;Walk-in shower   Bathroom Toilet: Standard     Home Equipment: Cane - single point   Additional Comments: pt has reacher at home      Prior Functioning/Environment Level of Independence: Independent with assistive device(s)         Comments: pt reports sometimes using SPC that is husbands, denies falls        OT Problem List: Decreased strength;Decreased range of motion;Decreased activity tolerance      OT Treatment/Interventions: Self-care/ADL training;Therapeutic activities;DME and/or AE instruction;Patient/family education    OT Goals(Current goals can be found in the care plan section) Acute Rehab OT Goals Patient Stated Goal: to return back to normal function OT Goal Formulation: With patient Time For Goal Achievement: 04/24/18 Potential to Achieve Goals: Good  OT Frequency: Min 2X/week   Barriers to D/C:            Co-evaluation              AM-PAC PT "6 Clicks" Daily Activity     Outcome Measure Help from another person eating meals?: None Help from another person taking care of personal grooming?: None Help from another person toileting, which includes using toliet, bedpan, or urinal?: A Lot Help from another person bathing (including washing, rinsing, drying)?: A Lot Help from another person to put on and taking off regular upper body clothing?: None Help from another person to put on and taking off regular lower body clothing?: A Lot 6 Click Score: 18   End of Session    Activity Tolerance: Patient limited by fatigue Patient left: in bed;with call bell/phone  within reach;with bed alarm set;with family/visitor present  OT Visit Diagnosis: Pain;Other abnormalities of gait and mobility (R26.89) Pain - Right/Left: Left Pain - part of body: Hip;Leg                Time: 8184-0375 OT Time Calculation (min): 16 min Charges:  OT General Charges $OT Visit: 1 Visit OT Evaluation $OT Eval Low Complexity: 1 Low G-Codes:     Zenovia Jarred, MSOT, OTR/L  [place] 04/10/2018, 6:19 PM

## 2018-04-10 NOTE — Evaluation (Signed)
Physical Therapy Evaluation Patient Details Name: Terri Wood MRN: [id number] DOB: December 09, 1965 Today's Date: 04/10/2018   History of Present Illness  Pt is a 52 y.o. female s/p L THA anterior approach 04/09/18 secondary avascular necrosis of L femoral head.  PMH includes htn, (+) smoker, anxiety, fibromyalgia, L hip OA.  Clinical Impression  Prior to hospital admission, pt was modified independent with functional mobility (used rollator in home and [facility] on stairs); no falls in past 6 months.  Pt lives with her husband in 1 level home with 3 steps to enter (no railing).  Currently pt is min assist for L LE supine to sit; min assist to stand with RW; and CGA to ambulate 10 feet with RW (limited distance d/t L hip pain).  Vc's and demo required for PWB'ing status during session.  L hip pain 5/10 beginning of session and 6-7/10 end of session (nursing notified).  Pt would benefit from skilled PT to address noted impairments and functional limitations (see below for any additional details).  Upon hospital discharge, recommend pt discharge to home with HHPT.    Follow Up Recommendations Home health PT    Equipment Recommendations  3in1 (PT)(youth sized RW)    Recommendations for Other Services       Precautions / Restrictions Precautions Precautions: Fall;Anterior Hip Restrictions Weight Bearing Restrictions: Yes LLE Weight Bearing: Partial weight bearing LLE Partial Weight Bearing Percentage or Pounds: 50%      Mobility  Bed Mobility Overal bed mobility: Needs Assistance Bed Mobility: Supine to Sit     Supine to sit: Min assist;HOB elevated     General bed mobility comments: assist for L LE; vc's for use of bedrail; increased time to perform  Transfers Overall transfer level: Needs assistance Equipment used: Rolling walker (2 wheeled) Transfers: Sit to/from Stand Sit to Stand: Min assist         General transfer comment: assist to initiate stand and vc's/demo for safe  transfer technique using RW  Ambulation/Gait Ambulation/Gait assistance: Min guard Gait Distance (Feet): 10 Feet Assistive device: Rolling walker (2 wheeled) Gait Pattern/deviations: Step-to pattern Gait velocity: decreased   General Gait Details: vc's for increasing UE support through RW to offweight L LE to maintain PWB'ing status; vc's for gait technique and walker use required  Stairs            Wheelchair Mobility    Modified Rankin (Stroke Patients Only)       Balance Overall balance assessment: Needs assistance Sitting-balance support: No upper extremity supported;Feet supported Sitting balance-Leahy Scale: Good Sitting balance - Comments: steady sitting reaching within BOS   Standing balance support: No upper extremity supported Standing balance-Leahy Scale: Fair Standing balance comment: able to stand static no UE support without loss of balance                             Pertinent Vitals/Pain Pain Assessment: 0-10 Pain Score: 7 (5/10 beginning of session; 6-7/10 end of session) Pain Location: L hip Pain Descriptors / Indicators: Sore Pain Intervention(s): Limited activity within patient's tolerance;Monitored during session;Premedicated before session;Repositioned  Vitals (HR and O2 on room air) stable and WFL throughout treatment session.    Home Living Family/patient expects to be discharged to:: Private residence Living Arrangements: Spouse/significant other Available Help at Discharge: Family Type of Home: House Home Access: Stairs to enter Entrance Stairs-Rails: None Entrance Stairs-Number of Steps: 3 steps from back or 5 from front Home Layout:  One level Home Equipment: Walker - 4 wheels;Cane - single point      Prior Function Level of Independence: Independent with assistive device(s)         Comments: Pt reports using rollator within home and SPC on stairs; does not go out much.  Denies any h/o falls in past 6 months.      Hand Dominance        Extremity/Trunk Assessment   Upper Extremity Assessment Upper Extremity Assessment: Overall WFL for tasks assessed    Lower Extremity Assessment Lower Extremity Assessment: RLE deficits/detail;LLE deficits/detail RLE Deficits / Details: strength and ROM WFL LLE Deficits / Details: at least 3/5 AROM knee flexion/extension and DF; hip flexion at least 2+/5 (limited d/t L hip pain) LLE: Unable to fully assess due to pain    Cervical / Trunk Assessment Cervical / Trunk Assessment: Normal  Communication   Communication: No difficulties  Cognition Arousal/Alertness: Awake/alert Behavior During Therapy: WFL for tasks assessed/performed Overall Cognitive Status: Within Functional Limits for tasks assessed                                        General Comments General comments (skin integrity, edema, etc.): L hemovac site dressing 1/2 soaked with bloody drainage beginning of session but appearing mostly soaked with bloody drainage end of session with bloody draining leaking from under adhesive onto pad on chair; nursing notified immediately and came to address.  Pt did not have nasal cannula on upon PT entering room (O2 93% or greater on room air during session).  Nursing cleared pt for participation in physical therapy.  Pt agreeable to PT session.    Exercises Total Joint Exercises Ankle Circles/Pumps: AROM;Strengthening;Both;10 reps;Supine Quad Sets: AROM;Strengthening;Both;10 reps;Supine Gluteal Sets: AROM;Strengthening;Both;10 reps;Supine Towel Squeeze: AROM;Strengthening;Both;10 reps;Supine Short Arc Quad: AROM;Strengthening;Left;10 reps;Supine Heel Slides: AAROM;Strengthening;Left;10 reps;Supine Hip ABduction/ADduction: AAROM;Strengthening;Left;10 reps;Supine   Assessment/Plan    PT Assessment Patient needs continued PT services  PT Problem List Decreased strength;Decreased activity tolerance;Decreased balance;Decreased  mobility;Decreased knowledge of use of DME;Decreased knowledge of precautions;Pain       PT Treatment Interventions DME instruction;Gait training;Stair training;Functional mobility training;Therapeutic activities;Therapeutic exercise;Balance training;Patient/family education    PT Goals (Current goals can be found in the Care Plan section)  Acute Rehab PT Goals Patient Stated Goal: to improve mobility with less pain PT Goal Formulation: With patient Time For Goal Achievement: 04/24/18 Potential to Achieve Goals: Good    Frequency BID   Barriers to discharge        Co-evaluation               AM-PAC PT "6 Clicks" Daily Activity  Outcome Measure Difficulty turning over in bed (including adjusting bedclothes, sheets and blankets)?: A Little Difficulty moving from lying on back to sitting on the side of the bed? : Unable Difficulty sitting down on and standing up from a chair with arms (e.g., wheelchair, bedside commode, etc,.)?: Unable Help needed moving to and from a bed to chair (including a wheelchair)?: A Little Help needed walking in hospital room?: A Little Help needed climbing 3-5 steps with a railing? : A Lot 6 Click Score: 13    End of Session Equipment Utilized During Treatment: Gait belt Activity Tolerance: Patient limited by pain Patient left: in chair;with call bell/phone within reach;with chair alarm set;with nursing/sitter in room;with SCD's reapplied;Other (comment)(B heels elevated via pillow) Nurse Communication: Mobility status;Precautions;Weight bearing  status;Other (comment)(Pt's pain status) PT Visit Diagnosis: Other abnormalities of gait and mobility (R26.89);Muscle weakness (generalized) (M62.81);Difficulty in walking, not elsewhere classified (R26.2);Pain Pain - Right/Left: Left Pain - part of body: Hip    Time: 6440-3474 PT Time Calculation (min) (ACUTE ONLY): 51 min   Charges:   PT Evaluation $PT Eval Low Complexity: 1 Low PT  Treatments $Gait Training: 8-22 mins $Therapeutic Exercise: 8-22 mins   PT G CodesHendricks Limes, PT 04/10/18, 10:02 AM [phone]

## 2018-04-10 NOTE — Progress Notes (Signed)
Physical Therapy Treatment Patient Details Name: Terri Wood MRN: [id number] DOB: Dec 23, 1965 Today's Date: 04/10/2018    History of Present Illness Pt is a 52 y.o. female s/p L THA anterior approach 04/09/18 secondary avascular necrosis of L femoral head.  PMH includes htn, (+) smoker, anxiety, fibromyalgia, L hip OA.    PT Comments    Pt able to progress to ambulating 40 feet with RW CGA; limited distance ambulating d/t pt fatigue and L hip pain (6-7/10 L hip pain beginning of session and 8/10 end of session; nursing notified for pain medications).  Pt appearing to be able to maintain PWB'ing status but B UE's fatigue with activity.  Will continue to progress pt with strength, transfers, and progressive ambulation per pt tolerance.   Follow Up Recommendations  Home health PT     Equipment Recommendations  3in1 (PT)(youth sized RW)    Recommendations for Other Services       Precautions / Restrictions Precautions Precautions: Fall;Anterior Hip Precaution Booklet Issued: Yes (comment) Restrictions Weight Bearing Restrictions: Yes LLE Weight Bearing: Weight bearing as tolerated LLE Partial Weight Bearing Percentage or Pounds: 50%    Mobility  Bed Mobility Overal bed mobility: Needs Assistance Bed Mobility: Sit to Supine       Sit to supine: Min assist;HOB elevated   General bed mobility comments: assist for L LE; vc's for use of bedrail; increased time to perform  Transfers Overall transfer level: Needs assistance Equipment used: Rolling walker (2 wheeled) Transfers: Sit to/from Stand Sit to Stand: Min assist         General transfer comment: assist to initiate stand; vc's/demo for safe transfer technique using RW  Ambulation/Gait Ambulation/Gait assistance: Min guard Gait Distance (Feet): 40 Feet Assistive device: Rolling walker (2 wheeled) Gait Pattern/deviations: Step-to pattern Gait velocity: decreased   General Gait Details: vc's for increasing UE  support through RW to offweight L LE to maintain PWB'ing status; vc's for gait technique and walker use required   Stairs             Wheelchair Mobility    Modified Rankin (Stroke Patients Only)       Balance Overall balance assessment: Needs assistance Sitting-balance support: No upper extremity supported;Feet supported Sitting balance-Leahy Scale: Good Sitting balance - Comments: steady sitting reaching within BOS   Standing balance support: No upper extremity supported Standing balance-Leahy Scale: Fair Standing balance comment: able to stand static no UE support without loss of balance                            Cognition Arousal/Alertness: Awake/alert Behavior During Therapy: WFL for tasks assessed/performed Overall Cognitive Status: Within Functional Limits for tasks assessed                                        Exercises General Exercises - Lower Extremity Long Arc Quad: AROM;Strengthening;Left;10 reps;Seated Hip Flexion/Marching: AAROM;Strengthening;Left;10 reps;Seated    General Comments General comments (skin integrity, edema, etc.): L hemovac site dressing without bloody drainage beginning of session but 1/2 way soaked with bloody drainage end of session (pt reports she has been monitoring site and would let nursing know if she noticed more).  Pt agreeable to PT session.      Pertinent Vitals/Pain Pain Assessment: 0-10 Pain Score: 8  Pain Location: L hip Pain Descriptors / Indicators: Aching;Sore;Constant;Grimacing;Guarding Pain  Intervention(s): Limited activity within patient's tolerance;Monitored during session;Premedicated before session;Repositioned;Patient requesting pain meds-RN notified  Vitals (HR and O2 on room air) stable and WFL throughout treatment session.    Home Living                      Prior Function            PT Goals (current goals can now be found in the care plan section) Acute Rehab  PT Goals Patient Stated Goal: to improve mobility with less pain PT Goal Formulation: With patient Time For Goal Achievement: 04/24/18 Potential to Achieve Goals: Good Progress towards PT goals: Progressing toward goals    Frequency    BID      PT Plan Current plan remains appropriate    Co-evaluation              AM-PAC PT "6 Clicks" Daily Activity  Outcome Measure  Difficulty turning over in bed (including adjusting bedclothes, sheets and blankets)?: A Little Difficulty moving from lying on back to sitting on the side of the bed? : Unable Difficulty sitting down on and standing up from a chair with arms (e.g., wheelchair, bedside commode, etc,.)?: Unable Help needed moving to and from a bed to chair (including a wheelchair)?: A Little Help needed walking in hospital room?: A Little Help needed climbing 3-5 steps with a railing? : A Lot 6 Click Score: 13    End of Session Equipment Utilized During Treatment: Gait belt Activity Tolerance: Patient limited by pain;Patient limited by fatigue Patient left: in bed;with call bell/phone within reach;with bed alarm set;with SCD's reapplied;Other (comment)(B heels elevated via pillow) Nurse Communication: Mobility status;Precautions;Weight bearing status;Other (comment);Patient requests pain meds(Pt's pain status) PT Visit Diagnosis: Other abnormalities of gait and mobility (R26.89);Muscle weakness (generalized) (M62.81);Difficulty in walking, not elsewhere classified (R26.2);Pain Pain - Right/Left: Left Pain - part of body: Hip     Time: 1335-1415 PT Time Calculation (min) (ACUTE ONLY): 40 min  Charges:  $Gait Training: 8-22 mins $Therapeutic Exercise: 8-22 mins $Therapeutic Activity: 8-22 mins                    G CodesHendricks Limes:        , PT 04/10/18, 3:16 PM [phone]

## 2018-04-11 LAB — BASIC METABOLIC PANEL
ANION GAP: 9 (ref 5–15)
BUN: 11 mg/dL (ref 6–20)
CALCIUM: 9.1 mg/dL (ref 8.9–10.3)
CO2: 28 mmol/L (ref 22–32)
Chloride: 102 mmol/L (ref 98–111)
Creatinine, Ser: 0.7 mg/dL (ref 0.44–1.00)
Glucose, Bld: 121 mg/dL — ABNORMAL HIGH (ref 70–99)
Potassium: 4.5 mmol/L (ref 3.5–5.1)
SODIUM: 139 mmol/L (ref 135–145)

## 2018-04-11 LAB — CBC
HEMATOCRIT: 39.3 % (ref 35.0–47.0)
HEMOGLOBIN: 12.7 g/dL (ref 12.0–16.0)
MCH: 28.9 pg (ref 26.0–34.0)
MCHC: 32.2 g/dL (ref 32.0–36.0)
MCV: 89.9 fL (ref 80.0–100.0)
Platelets: 276 10*3/uL (ref 150–440)
RBC: 4.37 MIL/uL (ref 3.80–5.20)
RDW: 14.6 % — AB (ref 11.5–14.5)
WBC: 9.9 10*3/uL (ref 3.6–11.0)

## 2018-04-11 LAB — SURGICAL PATHOLOGY

## 2018-04-11 MED ORDER — HYDROCODONE-ACETAMINOPHEN 5-325 MG PO TABS: 1.0000 | ORAL_TABLET | ORAL | 0 refills | Status: DC | PRN

## 2018-04-11 MED ORDER — ENOXAPARIN SODIUM 40 MG/0.4ML ~~LOC~~ SOLN: 40.0000 mg | SUBCUTANEOUS | 0 refills | Status: DC

## 2018-04-11 NOTE — Discharge Instructions (Signed)

## 2018-04-11 NOTE — Progress Notes (Signed)
   Subjective: 2 Days Post-Op Procedure(s) (LRB): TOTAL HIP ARTHROPLASTY ANTERIOR APPROACH (Left) Patient reports pain as moderate.   Patient is well, and has had no acute complaints or problems Denies any CP, SOB, ABD pain. We will continue therapy today.  Plan is to go Home after hospital stay.  Objective: Vital signs in last 24 hours: Temp:  [98.2 F (36.8 C)-98.6 F (37 C)] 98.3 F (36.8 C) (06/27 0749) Pulse Rate:  [84-87] 85 (06/27 0749) Resp:  [18] 18 (06/26 2319) BP: (107-155)/(61-84) 149/75 (06/27 0749) SpO2:  [95 %-96 %] 96 % (06/27 0749) Weight:  [109.8 kg (242 lb)] 109.8 kg (242 lb) (06/26 1124)  Intake/Output from previous day: 06/26 0701 - 06/27 0700 In: 360 [P.O.:360] Out: 1350 [Urine:1350] Intake/Output this shift: No intake/output data recorded.  Recent Labs    04/09/18 2040 04/11/18 0432  HGB 13.6 12.7   Recent Labs    04/09/18 2040 04/11/18 0432  WBC 14.5* 9.9  RBC 4.64 4.37  HCT 41.0 39.3  PLT 305 276   Recent Labs    04/09/18 2040 04/11/18 0432  NA  --  139  K  --  4.5  CL  --  102  CO2  --  28  BUN  --  11  CREATININE 0.90 0.70  GLUCOSE  --  121*  CALCIUM  --  9.1   No results for input(s): LABPT, INR in the last 72 hours.  EXAM General - Patient is Alert, Appropriate and Oriented Extremity - Neurovascular intact Sensation intact distally Intact pulses distally Dorsiflexion/Plantar flexion intact No cellulitis present Compartment soft Dressing - dressing C/D/I and no drainage.  Wound VAC intact. Motor Function - intact, moving foot and toes well on exam.   Past Medical History:  Diagnosis Date  . Anxiety   . Fibromyalgia   . Hypertension   . Osteoarthritis of left hip 2019    Assessment/Plan:   2 Days Post-Op Procedure(s) (LRB): TOTAL HIP ARTHROPLASTY ANTERIOR APPROACH (Left) Active Problems:   Avascular necrosis of left femoral head (HCC)  Estimated body mass index is 44.26 kg/m as calculated from the  following:   Height as of this encounter: 5\' 2"  (1.575 m).   Weight as of this encounter: 109.8 kg (242 lb). Advance diet Up with therapy  Needs bowel movement Labs are stable Care management to assist with discharge to home with HHPT  DVT Prophylaxis - Lovenox, Foot Pumps and TED hose Weight-Bearing as tolerated to left leg   T. Cranston Neighborhris , PA-C [facility] 04/11/2018, 8:35 AM

## 2018-04-11 NOTE — Progress Notes (Signed)
Physical Therapy Treatment Patient Details Name: Terri Wood MRN: [id number] DOB: 10-May-1966 Today's Date: 04/11/2018    History of Present Illness Pt is a 52 y.o. female s/p L THA anterior approach 04/09/18 secondary avascular necrosis of L femoral head.  PMH includes htn, (+) smoker, anxiety, fibromyalgia, L hip OA.    PT Comments    Pt able to progress to ambulating 120 feet with RW CGA to SBA.  Increased time required for ambulation d/t decreased cadence.  Intermittent vc's for PWB'ing status required.  Pain 5/10 L hip beginning of session and 5-6/10 end of session.  Will continue to progress pt with strengthening, progressive ambulation distance, and trial stairs as appropriate next session.    Follow Up Recommendations  Home health PT     Equipment Recommendations  3in1 (PT)(youth sized RW)    Recommendations for Other Services       Precautions / Restrictions Precautions Precautions: Fall;Anterior Hip Precaution Booklet Issued: Yes (comment) Restrictions Weight Bearing Restrictions: Yes LLE Weight Bearing: Partial weight bearing LLE Partial Weight Bearing Percentage or Pounds: 50%    Mobility  Bed Mobility Overal bed mobility: Needs Assistance Bed Mobility: Supine to Sit     Supine to sit: Min assist;HOB elevated     General bed mobility comments: assist for L LE; vc's for use of bedrail; increased time to perform  Transfers Overall transfer level: Needs assistance Equipment used: Rolling walker (2 wheeled) Transfers: Sit to/from Stand Sit to Stand: Min guard         General transfer comment: vc's for UE and LE placement; increased effort and time to come to standing  Ambulation/Gait Ambulation/Gait assistance: Min guard;Supervision Gait Distance (Feet): 120 Feet Assistive device: Rolling walker (2 wheeled) Gait Pattern/deviations: Step-to pattern Gait velocity: decreased   General Gait Details: intermittent vc's for increasing UE support through RW  to offweight L LE to maintain PWB'ing status; vc's for gait technique and walker use required occasionally   Stairs             Wheelchair Mobility    Modified Rankin (Stroke Patients Only)       Balance Overall balance assessment: Needs assistance Sitting-balance support: No upper extremity supported;Feet supported Sitting balance-Leahy Scale: Good Sitting balance - Comments: steady sitting reaching within BOS   Standing balance support: No upper extremity supported Standing balance-Leahy Scale: Fair Standing balance comment: able to stand static no UE support without loss of balance                            Cognition Arousal/Alertness: Awake/alert Behavior During Therapy: WFL for tasks assessed/performed Overall Cognitive Status: Within Functional Limits for tasks assessed                                        Exercises Total Joint Exercises Short Arc Quad: AROM;Strengthening;Left;10 reps;Supine Heel Slides: AAROM;Strengthening;Left;10 reps;Supine Hip ABduction/ADduction: AAROM;Strengthening;Left;10 reps;Supine Straight Leg Raises: AAROM;Strengthening;Left;10 reps;Supine    General Comments General comments (skin integrity, edema, etc.): drainage noted L hemovac site dressing.  Pt agreeable to PT session.      Pertinent Vitals/Pain Pain Assessment: 0-10 Pain Score: 6  Pain Location: L hip Pain Descriptors / Indicators: Aching;Operative site guarding Pain Intervention(s): Limited activity within patient's tolerance;Monitored during session;Premedicated before session;Repositioned    Home Living  Prior Function            PT Goals (current goals can now be found in the care plan section) Acute Rehab PT Goals Patient Stated Goal: to return back to normal function PT Goal Formulation: With patient Time For Goal Achievement: 04/24/18 Potential to Achieve Goals: Good Progress towards PT goals:  Progressing toward goals    Frequency    BID      PT Plan Current plan remains appropriate    Co-evaluation              AM-PAC PT "6 Clicks" Daily Activity  Outcome Measure  Difficulty turning over in bed (including adjusting bedclothes, sheets and blankets)?: A Little Difficulty moving from lying on back to sitting on the side of the bed? : Unable Difficulty sitting down on and standing up from a chair with arms (e.g., wheelchair, bedside commode, etc,.)?: Unable Help needed moving to and from a bed to chair (including a wheelchair)?: A Little Help needed walking in hospital room?: A Little Help needed climbing 3-5 steps with a railing? : A Lot 6 Click Score: 13    End of Session Equipment Utilized During Treatment: Gait belt Activity Tolerance: Patient limited by pain;Patient limited by fatigue Patient left: in chair;with call bell/phone within reach;with chair alarm set;with SCD's reapplied;Other (comment)(B heels elevated via pillow) Nurse Communication: Mobility status;Precautions;Weight bearing status;Other (comment)(via white board) PT Visit Diagnosis: Other abnormalities of gait and mobility (R26.89);Muscle weakness (generalized) (M62.81);Difficulty in walking, not elsewhere classified (R26.2);Pain Pain - Right/Left: Left Pain - part of body: Hip     Time: [id number] PT Time Calculation (min) (ACUTE ONLY): 42 min  Charges:  $Gait Training: 8-22 mins $Therapeutic Exercise: 8-22 mins $Therapeutic Activity: 8-22 mins                    G CodesHendricks Limes:        , PT 04/11/18, 11:04 AM [phone]

## 2018-04-11 NOTE — Progress Notes (Addendum)
Physical Therapy Treatment Patient Details Name: Terri Wood MRN: [id number] DOB: [birth year] Today's Date: 04/11/2018    History of Present Illness Pt is a 52 y.o. female s/p L THA anterior approach 04/09/18 secondary avascular necrosis of L femoral head.  PMH includes htn, (+) smoker, anxiety, fibromyalgia, L hip OA.    PT Comments    Patient up in chair at start of session with family at bedside and agreeable to PT, pain 5/10. Session focused on gait training and stair navigation. Patient able to ascend/descend stairs with RW, CGA x 2, and multimodal cues for proper technique/form. At end of session patient is able verbalize and teach back proper technique for stair navigation. Patient ambulated 182f with RW CGA/supervision with decreased speed, cadence, and step to gait pattern, with less verbal cues needed for PWB precautions. Patient returned to bed with all needs in reach and family at bedside and pain 6/10. The patient would benefit from further skilled PT to continue to progress towards goals, especially ambulation distance.       Follow Up Recommendations  Home health PT     Equipment Recommendations  3in1 (PT);Other (comment)(youth RW)    Recommendations for Other Services       Precautions / Restrictions Precautions Precautions: Fall;Anterior Hip Precaution Booklet Issued: Yes (comment) Restrictions Weight Bearing Restrictions: Yes LLE Weight Bearing: Partial weight bearing LLE Partial Weight Bearing Percentage or Pounds: 50%    Mobility  Bed Mobility Overal bed mobility: Needs Assistance Bed Mobility: Sit to Supine       Sit to supine: Min assist;HOB elevated   General bed mobility comments: Increased time needed for sit to supine, min Ax1 for LLE management, verbal cues for weight shift  Transfers Overall transfer level: Needs assistance Equipment used: Rolling walker (2 wheeled) Transfers: Sit to/from Stand Sit to Stand: Min guard         General  transfer comment: Improved sit <> stand compared to prior session, less effort/time needed, less cues for UE/LE placement. Patient demonstrated commode transfer with elevated commode, RW, CGA, and verbal cues for AD management and hand placement.   Ambulation/Gait Ambulation/Gait assistance: Min guard;Supervision Gait Distance (Feet): 130 Feet Assistive device: Rolling walker (2 wheeled) Gait Pattern/deviations: Step-to pattern;Decreased stride length;Antalgic;Decreased step length - left Gait velocity: decreased   General Gait Details: occasional cues for UE support to maintain PWB precautions (50%). Occasional verbal cues for AD management    Stairs Stairs: Yes Stairs assistance: Min guard;Supervision;Min assist Stair Management: Step to pattern;Backwards;Forwards;With walker(with youth sized walker) Number of Stairs: 3 General stair comments: adherence to PWB status emphasized, min Ax1 for AD stabilization, backwards up stairs, fowards downstairs   Wheelchair Mobility    Modified Rankin (Stroke Patients Only)       Balance Overall balance assessment: Needs assistance Sitting-balance support: Feet unsupported Sitting balance-Leahy Scale: Good Sitting balance - Comments: steady sitting reaching outside BOS   Standing balance support: No upper extremity supported Standing balance-Leahy Scale: Fair Standing balance comment: able to stand static no UE support without loss of balance                            Cognition                                              Exercises  General Comments General comments (skin integrity, edema, etc.): Wound vac not operating at start of session, nursing restarted. Increased hemovac site drainage noted after commode transfer (nursing notified).      Pertinent Vitals/Pain Pain Assessment: 0-10 Pain Score: 6 (Patient pain at beginning of session 5/10.) Pain Location: L hip Pain Descriptors /  Indicators: Operative site guarding;Throbbing;Sore Pain Intervention(s): Monitored during session;Repositioned    Home Living                      Prior Function            PT Goals (current goals can now be found in the care plan section) Acute Rehab PT Goals Patient Stated Goal: to return back to normal function PT Goal Formulation: With patient Time For Goal Achievement: 04/24/18 Potential to Achieve Goals: Good Progress towards PT goals: Progressing toward goals;Goals met and updated - see care plan    Frequency    BID      PT Plan Current plan remains appropriate    Co-evaluation              AM-PAC PT "6 Clicks" Daily Activity  Outcome Measure  Difficulty turning over in bed (including adjusting bedclothes, sheets and blankets)?: A Little Difficulty moving from lying on back to sitting on the side of the bed? : Unable Difficulty sitting down on and standing up from a chair with arms (e.g., wheelchair, bedside commode, etc,.)?: Unable Help needed moving to and from a bed to chair (including a wheelchair)?: A Little Help needed walking in hospital room?: A Little Help needed climbing 3-5 steps with a railing? : A Lot 6 Click Score: 13    End of Session Equipment Utilized During Treatment: Gait belt Activity Tolerance: Patient limited by pain;Patient limited by fatigue Patient left: in bed;with family/visitor present;with call bell/phone within reach;with bed alarm set;with SCD's reapplied; B heels elevated via pillow Nurse Communication: (Nursing staff alerted to patients increase in pain after session, to 6/10. ) PT Visit Diagnosis: Other abnormalities of gait and mobility (R26.89);Muscle weakness (generalized) (M62.81);Difficulty in walking, not elsewhere classified (R26.2);Pain Pain - Right/Left: Left Pain - part of body: Hip     Time: 4696-2952 PT Time Calculation (min) (ACUTE ONLY): 51 min  Charges:  $Gait Training: 8-22 mins $Therapeutic  Activity: 23-37 mins                    G Codes:      Lieutenant Diego PT, DPT 04/11/18, 14:54

## 2018-04-11 NOTE — Care Management Note (Signed)
Case Management Note  Patient Details  Name: Terri Wood MRN: [id number] Date of Birth: 12-21-1965  Subjective/Objective:   POD # 2 left total hip arthroplasty. Met with patient and her spouse at bedside to discuss discharge planning. Spouse will assist patient at discharge. She will need a youth walker and bedside commode. Ordered from [place] with [facility]. Offered a list of home health agencies. Spouse wanted to use [facility] and patient agreed. Referral to [facility] for [facility]PT. Pharmacy: [facility] [phone] [phone]. Called Lovenox 40 mg #14 no refills per Dr. Rudene Christians.                   Action/Plan: [facility] for [facility]PT, Youth Walker and bsc. Lovenox called in.   Expected Discharge Date:                  Expected Discharge Plan:  [place]  In-House Referral:     Discharge planning Services  CM Consult  Post Acute Care Choice:  Durable Medical Equipment, Home Health Choice offered to:  Patient  DME Arranged:  Walker rolling, Bedside commode DME Agency:  [place]:  [facility]:  [place]  Status of Service:  In process, will continue to follow  If discussed at Long Length of Stay Meetings, dates discussed:    Additional Comments:  Jolly Mango, RN 04/11/2018, 4:34 PM

## 2018-04-11 NOTE — Discharge Summary (Signed)
Physician Discharge Summary  Patient ID: Terri Wood MRN: [id number] DOB/AGE: [birth year] 51 y.o.  Admit date: 04/09/2018 Discharge date: 04/12/2018 Admission Diagnoses:  OSTEOARTHRITIS OF LEFT HIP   Discharge Diagnoses: Patient Active Problem List   Diagnosis Date Noted  . Avascular necrosis of left femoral head (HCC) 04/09/2018    Past Medical History:  Diagnosis Date  . Anxiety   . Fibromyalgia   . Hypertension   . Osteoarthritis of left hip 2019     Transfusion: none   Consultants (if any):   Discharged Condition: Improved  Hospital Course: Terri Wood is an 52 y.o. female who was admitted 04/09/2018 with a diagnosis of left hip AVN and went to the operating room on 04/09/2018 and underwent the above named procedures.    Surgeries: Procedure(s): TOTAL HIP ARTHROPLASTY ANTERIOR APPROACH on 04/09/2018 Patient tolerated the surgery well. Taken to PACU where she was stabilized and then transferred to the orthopedic floor.  Started on Lovenox 40 mg q 24 hrs. Foot pumps applied bilaterally at 80 mm. Heels elevated on bed with rolled towels. No evidence of DVT. Negative Homan. Physical therapy started on day #1 for gait training and transfer. OT started day #1 for ADL and assisted devices.  Patient's foley was d/c on day #1. Patient's IV was d/c on day #2.  On post op day #3 patient was stable and ready for discharge to home with HHPT.  Implants: Medacta AMIS 2 standard stem with 52 mm Mpact DM cup and liner, ceramic S 28 mm head  Please remove provena negative pressure dressing on 04/19/2018 and apply honey comb dressing. Keep dressing clean and dry at all times.   She was given perioperative antibiotics:  Anti-infectives (From admission, onward)   Start     Dose/Rate Route Frequency Ordered Stop   04/09/18 2000  clindamycin (CLEOCIN) IVPB 900 mg     900 mg 100 mL/hr over 30 Minutes Intravenous Every 6 hours 04/09/18 1936 04/10/18 0815   04/09/18 1214  clindamycin  (CLEOCIN) 900 MG/50ML IVPB    Note to Pharmacy:  Lorrene Reid   : cabinet override      04/09/18 1214 04/09/18 1550   04/09/18 0115  clindamycin (CLEOCIN) IVPB 900 mg     900 mg 100 mL/hr over 30 Minutes Intravenous  Once 04/09/18 0107 04/09/18 1600    .  She was given sequential compression devices, early ambulation, and Lovenox for DVT prophylaxis.  She benefited maximally from the hospital stay and there were no complications.    Recent vital signs:  Vitals:   04/11/18 0749 04/11/18 0922  BP: (!) 149/75 (!) 156/73  Pulse: 85   Resp:    Temp: 98.3 F (36.8 C)   SpO2: 96%     Recent laboratory studies:  Lab Results  Component Value Date   HGB 12.7 04/11/2018   HGB 13.6 04/09/2018   HGB 14.8 04/03/2018   Lab Results  Component Value Date   WBC 9.9 04/11/2018   PLT 276 04/11/2018   Lab Results  Component Value Date   INR 0.90 04/03/2018   Lab Results  Component Value Date   NA 139 04/11/2018   K 4.5 04/11/2018   CL 102 04/11/2018   CO2 28 04/11/2018   BUN 11 04/11/2018   CREATININE 0.70 04/11/2018   GLUCOSE 121 (H) 04/11/2018    Discharge Medications:   Allergies as of 04/11/2018      Reactions   Penicillins Hives   Has patient had a PCN  reaction causing immediate rash, facial/tongue/throat swelling, SOB or lightheadedness with hypotension: Yes Has patient had a PCN reaction causing severe rash involving mucus membranes or skin necrosis: No Has patient had a PCN reaction that required hospitalization: No Has patient had a PCN reaction occurring within the last 10 years: Yes If all of the above answers are "NO", then may proceed with Cephalosporin use.      Medication List    STOP taking these medications   diclofenac 75 MG EC tablet Commonly known as:  VOLTAREN   traMADol 50 MG tablet Commonly known as:  ULTRAM     TAKE these medications   amLODipine 10 MG tablet Commonly known as:  NORVASC Take 10 mg by mouth daily.   CALTRATE 600+D PLUS  PO Take 1 tablet by mouth daily.   clonazePAM 0.5 MG tablet Commonly known as:  KLONOPIN Take 1 tablet by mouth 3 (three) times daily as needed for anxiety.   DULoxetine 60 MG capsule Commonly known as:  CYMBALTA Take 60 mg by mouth daily.   enoxaparin 40 MG/0.4ML injection Commonly known as:  LOVENOX Inject 0.4 mLs (40 mg total) into the skin daily for 14 days. Start taking on:  04/12/2018   gabapentin 300 MG capsule Commonly known as:  NEURONTIN Take 600 mg by mouth 3 (three) times daily.   HYDROcodone-acetaminophen 5-325 MG tablet Commonly known as:  NORCO/VICODIN Take 1-2 tablets by mouth every 4 (four) hours as needed for moderate pain (pain score 4-6).   lisinopril-hydrochlorothiazide 20-25 MG tablet Commonly known as:  PRINZIDE,ZESTORETIC Take 1 tablet by mouth daily.   MULTI FOR HER 50+ PO Take 1 tablet by mouth daily.   Omega 3 1000 MG Caps Take 1 tablet by mouth every morning.   PARoxetine 20 MG tablet Commonly known as:  PAXIL Take 20 mg by mouth daily.   tiZANidine 4 MG tablet Commonly known as:  ZANAFLEX Take 2-4 mg by mouth 2 (two) times daily as needed for muscle spasms.            Durable Medical Equipment  (From admission, onward)        Start     Ordered   04/09/18 1937  DME Walker rolling  Once    Question:  Patient needs a walker to treat with the following condition  Answer:  Avascular necrosis of left femoral head (HCC)   04/09/18 1936   04/09/18 1937  DME 3 n 1  Once     04/09/18 1936   04/09/18 1937  DME Bedside commode  Once    Question:  Patient needs a bedside commode to treat with the following condition  Answer:  Avascular necrosis of left femoral head (HCC)   04/09/18 1936      Diagnostic Studies: Dg Hip Operative Unilat W Or W/o Pelvis Left  Result Date: 04/09/2018 CLINICAL DATA:  Left hip anterior approach arthroplasty. EXAM: OPERATIVE  HIP (WITH PELVIS IF PERFORMED)  VIEWS TECHNIQUE: Fluoroscopic spot image(s) were  submitted for interpretation post-operatively. COMPARISON:  None. FINDINGS: 24 seconds of fluoroscopic time utilized. Three fluoroscopic images provided intraoperatively. Uncemented left hip arthroplasty is noted without complicating features. Fine bony detail is limited about the left hip. IMPRESSION: Fluoroscopic time utilized during left hip arthroplasty without complicating features noted. Electronically Signed   By: Tollie Ethavid  Kwon M.D.   On: 04/09/2018 19:05   Dg Hip Unilat W Or W/o Pelvis 2-3 Views Left  Result Date: 04/09/2018 CLINICAL DATA:  Left hip arthroplasty, left  hip AVN EXAM: DG HIP (WITH OR WITHOUT PELVIS) 2-3V LEFT COMPARISON:  04/09/2018 FINDINGS: Portable left hip two view exam. Left total hip arthroplasty. Components appear aligned. Staples noted at the surgical site. Two surgical drains in place. Expected postoperative findings. Visualized pelvis intact. IMPRESSION: Left total hip arthroplasty. Expected postoperative findings. No complicating features or malalignment. Electronically Signed   By: Judie Petit.  Shick M.D.   On: 04/09/2018 18:37    Disposition:     Follow-up Information    Kennedy Bucker, MD Follow up in 2 week(s).   Specialty:  Orthopedic Surgery Contact information: [address] [place] [address] [phone]            Signed: [facility] 04/11/2018, 2:02 PM

## 2018-04-12 NOTE — Care Management (Addendum)
Patient to be discharged today per MD order. Previous RNCM initiated Home Health planning and patient has chose [facility]. Patient confirmed to this RNCM she would like to proceed with this agency at discharge. Order in for DME youth walker and bsc, notified Barbara CowerJason from [facility] who will deliver needed supplies. Spoke with [facility], Lovenox script will have no charge. RNCM to sign off. Buddy DutyJosh  RN BSN RNCM [phone]

## 2018-04-12 NOTE — Progress Notes (Signed)
Physical Therapy Treatment Patient Details Name: Terri Wood MRN: [id number] DOB: [birth year] Today's Date: 04/12/2018    History of Present Illness Pt is a 52 y.o. female s/p L THA anterior approach 04/09/18 secondary avascular necrosis of L femoral head.  PMH includes htn, (+) smoker, anxiety, fibromyalgia, L hip OA.    PT Comments    Pt able to progress to ambulating around nursing loop with RW SBA.  Pt steady with all functional mobility (no loss of balance during session).  Pt appearing to be able to maintain PWB'ing status without any cueing.  Pain 5/10 L hip beginning of session and 7/10 end of session (nursing notified of pt's request for pain medications).  Pt did require min assist with bed mobility but pt reports her husband is able to assist her with this and pt reporting no concerns with this.  Pt declined stairs this morning d/t feeling comfortable with stairs performance yesterday; pt able to verbalize how to safely navigate stairs; pt reporting no concerns with stairs and that her husband will be able to assist as needed.  Pt appears safe to discharge home when medically appropriate (nurse notified).  Plan to discharge home today.    Follow Up Recommendations  Home health PT     Equipment Recommendations  3in1 (PT);Other (comment)(youth RW)    Recommendations for Other Services       Precautions / Restrictions Precautions Precautions: Fall;Anterior Hip Precaution Booklet Issued: Yes (comment) Restrictions Weight Bearing Restrictions: Yes LLE Weight Bearing: Partial weight bearing LLE Partial Weight Bearing Percentage or Pounds: 50%    Mobility  Bed Mobility Overal bed mobility: Needs Assistance Bed Mobility: Supine to Sit     Supine to sit: Min assist;HOB elevated     General bed mobility comments: pt able to hook R LE under L LE to assist to edge of bed; hand hold assist to come to sitting position from flat bed (pt reports her husband is able to assist with  bed mobility as needed)  Transfers Overall transfer level: Modified independent Equipment used: Rolling walker (2 wheeled) Transfers: Sit to/from [employer] Sit to Stand: Modified independent (Device/Increase time) Stand pivot transfers: Modified independent (Device/Increase time)       General transfer comment: increased effort to stand from lower surfaces but steady and no physical assist required  Ambulation/Gait Ambulation/Gait assistance: Supervision(SBA for wound vac management) Gait Distance (Feet): 190 Feet Assistive device: Rolling walker (2 wheeled)   Gait velocity: decreased   General Gait Details: step to gait pattern; no cueing required for PWB'ing precautions; steady; decreased stance time L LE; increased effort initially to advance L LE but improved with repetition   Stairs Stairs: (pt declined (pt felt comfortable with stairs practice yesterday))           Wheelchair Mobility    Modified Rankin (Stroke Patients Only)       Balance Overall balance assessment: Needs assistance Sitting-balance support: Feet unsupported;No upper extremity supported Sitting balance-Leahy Scale: Normal Sitting balance - Comments: steady sitting reaching outside BOS   Standing balance support: No upper extremity supported Standing balance-Leahy Scale: Good Standing balance comment: steady standing washing hands at sink                            Cognition Arousal/Alertness: Awake/alert Behavior During Therapy: WFL for tasks assessed/performed Overall Cognitive Status: Within Functional Limits for tasks assessed  Exercises      General Comments General comments (skin integrity, edema, etc.): wound vac dressing intact.  Nursing cleared pt for participation in physical therapy.  Pt agreeable to PT session.      Pertinent Vitals/Pain Pain Assessment: 0-10 Pain Score: 7  Pain  Location: L hip Pain Descriptors / Indicators: Burning;Constant;Grimacing;Guarding;Operative site guarding Pain Intervention(s): Limited activity within patient's tolerance;Monitored during session;Premedicated before session;Repositioned;Patient requesting pain meds-RN notified;Ice applied    Home Living                      Prior Function            PT Goals (current goals can now be found in the care plan section) Acute Rehab PT Goals Patient Stated Goal: to return back to normal function PT Goal Formulation: With patient Time For Goal Achievement: 04/24/18 Potential to Achieve Goals: Good Progress towards PT goals: Progressing toward goals    Frequency    BID      PT Plan Current plan remains appropriate    Co-evaluation              AM-PAC PT "6 Clicks" Daily Activity  Outcome Measure  Difficulty turning over in bed (including adjusting bedclothes, sheets and blankets)?: A Little Difficulty moving from lying on back to sitting on the side of the bed? : Unable Difficulty sitting down on and standing up from a chair with arms (e.g., wheelchair, bedside commode, etc,.)?: A Little Help needed moving to and from a bed to chair (including a wheelchair)?: None Help needed walking in hospital room?: A Little Help needed climbing 3-5 steps with a railing? : A Little 6 Click Score: 17    End of Session Equipment Utilized During Treatment: Gait belt Activity Tolerance: Patient tolerated treatment well Patient left: in chair;with call bell/phone within reach;with chair alarm set;with SCD's reapplied;Other (comment)(B heels elevated via pillow; ice pack in place L hip) Nurse Communication: Mobility status;Precautions;Patient requests pain meds;Weight bearing status PT Visit Diagnosis: Other abnormalities of gait and mobility (R26.89);Muscle weakness (generalized) (M62.81);Difficulty in walking, not elsewhere classified (R26.2);Pain Pain - Right/Left: Left Pain -  part of body: Hip     Time: 0827-0914 PT Time Calculation (min) (ACUTE ONLY): 47 min  Charges:  $Gait Training: 23-37 mins $Therapeutic Activity: 8-22 mins                    G CodesHendricks Limes:        , PT 04/12/18, 9:29 AM [phone]

## 2018-04-12 NOTE — Progress Notes (Signed)
Patient is being discharge home. IV removed with cath intact. Discharge instruction reviewed with patient and her mother. Switched wound vac over to prevena and reviewed booklet. Allowed time for questions.

## 2018-04-12 NOTE — Progress Notes (Signed)
   Subjective: 3 Days Post-Op Procedure(s) (LRB): TOTAL HIP ARTHROPLASTY ANTERIOR APPROACH (Left) Patient reports pain as moderate.   Patient is well, and has had no acute complaints or problems Denies any CP, SOB, ABD pain. Bowel movement occurred yesterday. We will continue therapy today.  Plan is to go Home after hospital stay.  Objective: Vital signs in last 24 hours: Temp:  [97.7 F (36.5 C)-98.6 F (37 C)] 97.7 F (36.5 C) (06/27 2351) Pulse Rate:  [79-89] 79 (06/27 2351) Resp:  [19] 19 (06/27 2351) BP: (128-156)/(73-76) 128/75 (06/27 2351) SpO2:  [92 %-94 %] 92 % (06/27 2351)  Intake/Output from previous day: 06/27 0701 - 06/28 0700 In: 720 [P.O.:720] Out: 400 [Urine:400] Intake/Output this shift: No intake/output data recorded.  Recent Labs    04/09/18 2040 04/11/18 0432  HGB 13.6 12.7   Recent Labs    04/09/18 2040 04/11/18 0432  WBC 14.5* 9.9  RBC 4.64 4.37  HCT 41.0 39.3  PLT 305 276   Recent Labs    04/09/18 2040 04/11/18 0432  NA  --  139  K  --  4.5  CL  --  102  CO2  --  28  BUN  --  11  CREATININE 0.90 0.70  GLUCOSE  --  121*  CALCIUM  --  9.1   No results for input(s): LABPT, INR in the last 72 hours.  EXAM General - Patient is Alert, Appropriate and Oriented Extremity - Neurovascular intact Sensation intact distally Intact pulses distally Dorsiflexion/Plantar flexion intact No cellulitis present Compartment soft  Dressing - dressing C/D/I and no drainage.  Wound VAC intact. Motor Function - intact, moving foot and toes well on exam.   Past Medical History:  Diagnosis Date  . Anxiety   . Fibromyalgia   . Hypertension   . Osteoarthritis of left hip 2019    Assessment/Plan:   3 Days Post-Op Procedure(s) (LRB): TOTAL HIP ARTHROPLASTY ANTERIOR APPROACH (Left) Active Problems:   Avascular necrosis of left femoral head (HCC)  Estimated body mass index is 44.26 kg/m as calculated from the following:   Height as of this  encounter: 5\' 2"  (1.575 m).   Weight as of this encounter: 109.8 kg (242 lb). Advance diet Up with therapy  Labs are stable Care management to assist with discharge to home with HHPT Discharge home with home health PT today.  DVT Prophylaxis - Lovenox, Foot Pumps and TED hose Weight-Bearing as tolerated to left leg   T. Cranston Neighborhris , PA-C [facility] 04/12/2018, 7:58 AM

## 2018-04-18 ENCOUNTER — Telehealth: Payer: Self-pay | Admitting: Licensed Clinical Social Worker

## 2018-04-18 NOTE — Telephone Encounter (Signed)
EMMI flagged patient for answering yes to loss of interest in things. Clinical Social Worker (CSW) attempted to contact patient via telephone however she did not answer and a voicemail was left.    , LCSW [phone]  

## 2018-04-19 NOTE — Telephone Encounter (Signed)
Clinical Child psychotherapistocial Worker (CSW) contacted patient for the second time via telephone today and she answered. Per patient she is doing good and had home health PT today. Per patient she is making good progress with PT. Patient reported that she is not feeling sad or depressed. Per patient she feels a little down because she is not as independent as she normally is right now however she is determined to continue to work hard during recovery. Patient reported that she is not having thoughts of hurting herself. Patient reported no needs or concerns.   Baker Hughes IncorporatedBailey , LCSW [phone]

## 2018-05-23 ENCOUNTER — Encounter: Payer: Self-pay | Admitting: Physical Therapy

## 2018-05-23 ENCOUNTER — Ambulatory Visit: Payer: BLUE CROSS/BLUE SHIELD | Attending: Orthopedic Surgery | Admitting: Physical Therapy

## 2018-05-23 DIAGNOSIS — M25552 Pain in left hip: Secondary | ICD-10-CM | POA: Diagnosis present

## 2018-05-23 NOTE — Therapy (Signed)
[place] [facility] [address] [place], [place], [address] Phone: [phone]   Fax:  [phone]  Physical Therapy Treatment  Patient Details  Name: Terri Wood MRN: [id number] Date of Birth: 10/24/1965 No data recorded  Encounter Date: 05/23/2018    Past Medical History:  Diagnosis Date  . Anxiety   . Fibromyalgia   . Hypertension   . Osteoarthritis of left hip 2019    Past Surgical History:  Procedure Laterality Date  . ABDOMINAL SURGERY  1996   gastric bypass; stapling; surgilite  . PLANTAR FASCIA SURGERY Right 2009  . TOTAL HIP ARTHROPLASTY Left 04/09/2018   Procedure: TOTAL HIP ARTHROPLASTY ANTERIOR APPROACH;  Surgeon: Kennedy BuckerMenz, Michael, MD;  Location: [facility] ORS;  Service: Orthopedics;  Laterality: Left;    There were no vitals filed for this visit.  Subjective Assessment - 05/23/18 1648    Subjective  L THA    Pertinent History  Patient is a 52 year old female s/p L ant THA 04/09/18. Following surgery patient had home health PT for 2x/week for 3weeks, which she reports is mostly chair exercise. Patient reports she is "periodically using a cane" but is trying to not use AD if able. 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  Patient reports follow up with surgeon yesterday, and reports surgeon mentioned good healing, but wanted patient to increase strength. Patient reports L hip pain worst 3/10, best 0/10. Patient reports most pain with transitional movements, but that she is mostly concerned about regaining strength. Currently, patient reports uphill/downhill walking, recumbant bike, stair stepper, and leg lifts.          ROM All hip and knee motions wnl  Strength Hip flex: L:4/5  R: 5/5 Hip abd L 3+/5 R 4+/5 Hip add 4+/5  Hip Ext in prone: L 3+/5 R 4/5 Hip IR: L 4/5 R 5/5 Hip ER: L 4/5 R 5/5 Knee flex L 4+/5 R 5/5 Knee ext L 4+/5 R 5/5 Ankle DF 5/5 bilat Ankle PF      Special Tests/ Other Patient with minimal quad lag but much difficulty with SLR L  (+) Elys bilat L>R (-) 90/90 bilat (+) FABER on L (+) FADIR on L  (+) Obers bilat 5xSTS 15sec  10 MWT 13sec   Ther-Ex - Patient unable to complete standing hip abd with UE support, modified to sidelying hip abd x10 (HEP) - Bridge x 10 with cuing for proper form/hip ext (HEP) - Clamshell x10 with cuing for eccentric control (HEP) - Mini squat x10 with demo and cuing for proper form (HEP) -Education on PT role and there-ex role on pain and strengthening muscles surrounding the hip to increase stability and decrease pain              .                                  Patient will benefit from skilled therapeutic intervention in order to improve the following deficits and impairments:     Visit Diagnosis: No diagnosis found.     Problem List Patient Active Problem List   Diagnosis Date Noted  . Avascular necrosis of left femoral head (HCC) 04/09/2018   Staci Acostahelsea Miller PT, DPT Staci Acostahelsea Miller 05/23/2018, 4:54 PM  [place] [facility] [address] [place], [place], [address]  Phone: [phone]   Fax:  [phone]  Name: Terri Wood MRN: [id number] Date of Birth: 25-Sep-1966

## 2018-05-27 ENCOUNTER — Encounter: Payer: Self-pay | Admitting: Physical Therapy

## 2018-05-27 ENCOUNTER — Ambulatory Visit: Payer: BLUE CROSS/BLUE SHIELD | Admitting: Physical Therapy

## 2018-05-27 DIAGNOSIS — M25552 Pain in left hip: Secondary | ICD-10-CM
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
## 2018-05-30 ENCOUNTER — Ambulatory Visit: Payer: BLUE CROSS/BLUE SHIELD | Admitting: Physical Therapy

## 2018-05-30 ENCOUNTER — Encounter: Payer: Self-pay | Admitting: Physical Therapy

## 2018-05-30 DIAGNOSIS — M25552 Pain in left hip: Secondary | ICD-10-CM | POA: Diagnosis not present

## 2018-05-30 NOTE — Therapy (Signed)
[place] [facility] [address], [place], [address] Phone: [phone]   Fax:  [phone]  Physical Therapy Treatment  Patient Details  Name: Terri Wood MRN: [id number] Date of Birth: [birth year] Referring Provider: Dr. Rosita Kea   Encounter Date: 05/30/2018  PT End of Session - 05/30/18 1059    Visit Number  3    Number of Visits  17    Date for PT Re-Evaluation  07/18/18    PT Start Time  1055    PT Stop Time  1133    PT Time Calculation (min)  38 min    Activity Tolerance  Patient tolerated treatment well    Behavior During Therapy  [facility] for tasks assessed/performed       Past Medical History:  Diagnosis Date  . Anxiety   . Fibromyalgia   . Hypertension   . Osteoarthritis of left hip 2019    Past Surgical History:  Procedure Laterality Date  . ABDOMINAL SURGERY  1996   gastric bypass; stapling; surgilite  . PLANTAR FASCIA SURGERY Right 2009  . TOTAL HIP ARTHROPLASTY Left 04/09/2018   Procedure: TOTAL HIP ARTHROPLASTY ANTERIOR APPROACH;  Surgeon: Kennedy Bucker, MD;  Location: [facility] ORS;  Service: Orthopedics;  Laterality: Left;    There were no vitals filed for this visit.  Subjective Assessment - 05/30/18 1057    Subjective  Patient reports she is having some stiffness with standing from sititng, put no pain. 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 Patient reports compliance with her HEP with no questions or concerns.     Pertinent History  Patient is a 52 year old female s/p L ant THA 04/09/18. Following surgery patient had home health PT for 2x/week for 3weeks, which she reports is mostly chair exercise. Patient reports she is "periodically using a cane" but is trying to not use AD if able. 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  Patient reports follow up with surgeon yesterday,  and reports surgeon mentioned good healing, but wanted patient to increase strength. Patient reports L hip pain worst 3/10, best 0/10. Patient reports most pain with transitional movements, but that she is mostly concerned about regaining strength. Currently, patient reports uphill/downhill walking, recumbant bike, stair stepper, and leg lifts. Pt denies N/V, unexplained weight fluctuation, saddle paresthesia, fever, B&B, night sweats, or unrelenting night pain at this time.    Limitations  Lifting;Standing;Walking    How long can you sit comfortably?  unlimited    How long can you stand comfortably?  unlimited    How long can you walk comfortably?  unlimited    Patient Stated Goals  Go back to work, increase hip strength    Pain Onset  More than a month ago       Ther-Ex -Nustep L2 L3 - Mini squat 3x 10 at treadmill bar for balance support with min cuing for even wt distribution on LLE  - Mini lunge/Split squat 3x 10 at treadmill bar for balance support with min TC to prevent hip rotation to ensure LLE activation and VC for "wt on heels - MATRIX hip abd 10# 3x 10each side with VC and TC for proper posture without lateral trunk lean compensation - MATRIX hip ext 25# 3x 10 with min cuing for glute contraction without hip rotation - OMEGA leg press 15# 3x 10 with min  cuing for eccentric control abd ext leg press                      PT Education - 05/30/18 1059    Education Details  Exercise form    Person(s) Educated  Patient    Methods  Explanation;Demonstration;Verbal cues    Comprehension  Verbalized understanding;Returned demonstration;Verbal cues required       PT Short Term Goals - 05/24/18 0920      PT SHORT TERM GOAL #1   Title  Pt will be independent with HEP in order to improve strength and balance in order to decrease fall risk and improve function at home and work.    Time  4    Period  Weeks    Status  New        PT Long Term Goals -  05/27/18 1101      PT LONG TERM GOAL #1   Title  Pt will decrease 5TSTS by at least 3 seconds in order to demonstrate clinically significant improvement in LE strength    Baseline  05/23/18 15sec    Time  8    Period  Weeks    Status  New      PT LONG TERM GOAL #2   Title  Pt will increase 10MWT by at least 0.13 m/s in order to demonstrate clinically significant improvement in community ambulation.    Baseline  05/23/18 0.3060m/s    Time  8    Period  Weeks      PT LONG TERM GOAL #3   Title  Patient will increase FOTO score to 63 to demonstrate predicted increase in functional mobility to complete ADLs    Baseline  05/23/18 63    Time  8    Period  Weeks    Status  New            Plan - 05/30/18 1128    Clinical Impression Statement  PT continue to led patient through hip strengthening therex, which she was able to complete with good carry over of PT cuing and no increased pain. PT cued patient to prevent compensation, which patient was able to complete following, with TC cuing. PT will continue to increase strengthening as able.     Rehab Potential  Good    Clinical Impairments Affecting Rehab Potential  (-) sedentary lifestyle, obesity, chronicity of pain, other comorbidities (+) motivation, support system    PT Frequency  2x / week    PT Duration  8 weeks    PT Treatment/Interventions  Balance training;Gait training;Neuromuscular re-education;Passive range of motion;Aquatic Therapy;Moist Heat;Stair training;Manual techniques;Dry needling;Functional mobility training;Traction;Therapeutic activities;Patient/family education;Cryotherapy;Taping    PT Next Visit Plan  LE strengthening and balance; wound monitoring    PT Home Exercise Plan  bridge, clamshell, minisquat, hip abd    Consulted and Agree with Plan of Care  Patient       Patient will benefit from skilled therapeutic intervention in order to improve the following deficits and impairments:  Abnormal gait, Increased fascial  restricitons, Improper body mechanics, Pain, Postural dysfunction, Impaired tone, Decreased mobility, Decreased endurance, Decreased activity tolerance, Decreased range of motion, Decreased strength, Decreased balance, Difficulty walking  Visit Diagnosis: Pain in left hip     Problem List Patient Active Problem List   Diagnosis Date Noted  . Avascular necrosis of left femoral head (HCC) 04/09/2018   Staci Acostahelsea Miller PT, DPT Staci Acostahelsea Miller 05/30/2018, 11:30 AM  [place] [facility] [address] [place], [place], [address] Phone: [phone]   Fax:  [phone]  Name: Terri Wood MRN: [id number] Date of Birth: 05/20/1966

## 2018-05-31 ENCOUNTER — Ambulatory Visit: Payer: BLUE CROSS/BLUE SHIELD | Admitting: Physical Therapy

## 2018-06-04 ENCOUNTER — Ambulatory Visit: Payer: BLUE CROSS/BLUE SHIELD | Admitting: Physical Therapy

## 2018-06-04 ENCOUNTER — Encounter: Payer: Self-pay | Admitting: Physical Therapy

## 2018-06-04 DIAGNOSIS — M25552 Pain in left hip: Secondary | ICD-10-CM | POA: Diagnosis not present

## 2018-06-04 NOTE — Therapy (Signed)
[facility] [address] [place], [place], [address] Phone: [phone]   Fax:  [phone]  Physical Therapy Treatment  Patient Details  Name: Terri Wood MRN: [id number] Date of Birth: 02/26/1966 Referring Provider: Dr. Rosita KeaMenz   Encounter Date: 06/04/2018  PT End of Session - 06/04/18 1312    Visit Number  4    Number of Visits  17    Date for PT Re-Evaluation  07/18/18    PT Start Time  0104    PT Stop Time  0145    PT Time Calculation (min)  41 min    Activity Tolerance  Patient tolerated treatment well    Behavior During Therapy  [facility]/performed       Past Medical History:  Diagnosis Date  . Anxiety   . Fibromyalgia   . Hypertension   . Osteoarthritis of left hip 2019    Past Surgical History:  Procedure Laterality Date  . ABDOMINAL SURGERY  1996   gastric bypass; stapling; surgilite  . PLANTAR FASCIA SURGERY Right 2009  . TOTAL HIP ARTHROPLASTY Left 04/09/2018   Procedure: TOTAL HIP ARTHROPLASTY ANTERIOR APPROACH;  Surgeon: Kennedy BuckerMenz, Michael, MD;  Location: [facility];  Service: Orthopedics;  Laterality: Left;    There were no vitals filed for this visit.  Subjective Assessment - 06/04/18 1303    Subjective  Patient reports min pain in the L hip today, with only some stiffness. Patient reports compliance with her HEP with no questions or concerns.     Pertinent History  Patient is a 52 year old female s/p L ant THA 04/09/18. Following surgery patient had home health PT for 2x/week for 3weeks, which she reports is mostly chair exercise. Patient reports she is "periodically using a cane" but is trying to not use AD if able. 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  Patient reports follow up with surgeon yesterday, and reports surgeon mentioned good healing, but wanted patient to increase strength. Patient reports L hip pain worst 3/10, best 0/10. Patient  reports most pain with transitional movements, but that she is mostly concerned about regaining strength. Currently, patient reports uphill/downhill walking, recumbant bike, stair stepper, and leg lifts. Pt denies N/V, unexplained weight fluctuation, saddle paresthesia, fever, B&B, night sweats, or unrelenting night pain at this time.    Limitations  Lifting;Standing;Walking    How long can you sit comfortably?  unlimited    How long can you stand comfortably?  unlimited    How long can you walk comfortably?  unlimited    Patient Stated Goals  Go back to work, increase hip strength    Pain Onset  More than a month ago       Ther-Ex -Nustep 3min L2 3min L3 - Mini squat 3x 10 at treadmill bar for balance support with balance stone under LLE for increased wt shift to L and VC for "wt on heels" - Mini lunge/Split squat 3x 10 at treadmill bar for balance support with R foot on balance stone for encouragement of LLE activation with min cuing for proper form - Attempted ball toss activity in SLS; discontinued as pt is unable to maintain SLS without dynamic activity - Ball toss with PT tossing ball outside pts BOS with R foot on balance stone, and with bilat LE on airex pad - SLS trials with patient able to hold L SLS for up to 17sec following multiple  trials  - MATRIX hip abd 10# 3x 10each side with VC and TC for proper posture without lateral trunk lean compensation                      PT Education - 06/04/18 1312    Education Details  Exercise form    Person(s) Educated  Patient    Methods  Explanation;Demonstration;Verbal cues;Tactile cues    Comprehension  Verbalized understanding;Returned demonstration;Verbal cues required;Tactile cues required       PT Short Term Goals - 05/24/18 0920      PT SHORT TERM GOAL #1   Title  Pt will be independent with HEP in order to improve strength and balance in order to decrease fall risk and improve function at home and work.     Time  4    Period  Weeks    Status  New        PT Long Term Goals - 05/27/18 1101      PT LONG TERM GOAL #1   Title  Pt will decrease 5TSTS by at least 3 seconds in order to demonstrate clinically significant improvement in LE strength    Baseline  05/23/18 15sec    Time  8    Period  Weeks    Status  New      PT LONG TERM GOAL #2   Title  Pt will increase by at least 0.13 m/s in order to demonstrate clinically significant improvement in community ambulation.    Baseline  05/23/18 0.5m/s    Time  8    Period  Weeks      PT LONG TERM GOAL #3   Title  Patient will increase FOTO score to 63 to demonstrate predicted increase in functional mobility to complete ADLs    Baseline  05/23/18 63    Time  8    Period  Weeks    Status  New            Plan - 06/04/18 1320    Clinical Impression Statement  PT continued to progress LE strengthening with added balance component. Patient was able to complete all therex with accuracy with PT cuing, with good carry over between sets.     Rehab Potential  Good    Clinical Impairments Affecting Rehab Potential  (-) sedentary lifestyle, obesity, chronicity of pain, other comorbidities (+) motivation, support system    PT Frequency  2x / week    PT Duration  8 weeks    PT Treatment/Interventions  Balance training;Gait training;Neuromuscular re-education;Passive range of motion;Aquatic Therapy;Moist Heat;Stair training;Manual techniques;Dry needling;Functional mobility training;Traction;Therapeutic activities;Patient/family education;Cryotherapy;Taping    PT Next Visit Plan  LE strengthening and balance; wound monitoring    PT Home Exercise Plan  bridge, clamshell, minisquat, hip abd    Consulted and Agree with Plan of Care  Patient       Patient will benefit from skilled therapeutic intervention in order to improve the following deficits and impairments:  Abnormal gait, Increased fascial restricitons, Improper body mechanics, Pain, Postural  dysfunction, Impaired tone, Decreased mobility, Decreased endurance, Decreased activity tolerance, Decreased range of motion, Decreased strength, Decreased balance, Difficulty walking  Visit Diagnosis: Pain in left hip     Problem List Patient Active Problem List   Diagnosis Date Noted  . Avascular necrosis of left femoral head (HCC) 04/09/2018   Staci Acosta PT, DPT Staci Acosta 06/04/2018, 2:21 PM  [facility] PHYS[facility] AND SPORTS  MEDICINE [address] [place], [place], [address] Phone: [phone]   Fax:  [phone]  Name: Terri Wood MRN: [id number] Date of Birth: 05/26/1966

## 2018-06-07 ENCOUNTER — Encounter: Payer: Self-pay | Admitting: Physical Therapy

## 2018-06-07 ENCOUNTER — Ambulatory Visit: Payer: BLUE CROSS/BLUE SHIELD | Admitting: Physical Therapy

## 2018-06-07 DIAGNOSIS — M25552 Pain in left hip: Secondary | ICD-10-CM

## 2018-06-07 NOTE — Therapy (Signed)
[facility] [address] [place], [place], [address] Phone: [phone]   Fax:  [phone]  Physical Therapy Treatment  Patient Details  Name: Terri Wood MRN: [id number] Date of Birth: 05/05/1966 Referring Provider: Dr. Rosita KeaMenz   Encounter Date: 06/07/2018  PT End of Session - 06/07/18 0923    Visit Number  5    Number of Visits  17    Date for PT Re-Evaluation  07/18/18    PT Start Time  0915    PT Stop Time  1000    PT Time Calculation (min)  45 min    Activity Tolerance  Patient tolerated treatment well    Behavior During Therapy  [facility] assessed/performed       Past Medical History:  Diagnosis Date  . Anxiety   . Fibromyalgia   . Hypertension   . Osteoarthritis of left hip 2019    Past Surgical History:  Procedure Laterality Date  . ABDOMINAL SURGERY  1996   gastric bypass; stapling; surgilite  . PLANTAR FASCIA SURGERY Right 2009  . TOTAL HIP ARTHROPLASTY Left 04/09/2018   Procedure: TOTAL HIP ARTHROPLASTY ANTERIOR APPROACH;  Surgeon: Kennedy BuckerMenz, Michael, MD;  Location: [facility];  Service: Orthopedics;  Laterality: Left;    There were no vitals filed for this visit.  Subjective Assessment - 06/07/18 0921    Subjective  Patient reports no hip pain today. Patient reports compliance with HEP with no questions or concerns.     Pertinent History  Patient is a 52 year old female s/p L ant THA 04/09/18. Following surgery patient had home health PT for 2x/week for 3weeks, which she reports is mostly chair exercise. Patient reports she is "periodically using a cane" but is trying to not use AD if able. 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  Patient reports follow up with surgeon yesterday, and reports surgeon mentioned good healing, but wanted patient to increase strength. Patient reports L hip pain worst 3/10, best 0/10. Patient reports most pain with transitional  movements, but that she is mostly concerned about regaining strength. Currently, patient reports uphill/downhill walking, recumbant bike, stair stepper, and leg lifts. Pt denies N/V, unexplained weight fluctuation, saddle paresthesia, fever, B&B, night sweats, or unrelenting night pain at this time.    Limitations  Lifting;Standing;Walking    How long can you sit comfortably?  unlimited    How long can you stand comfortably?  unlimited    How long can you walk comfortably?  unlimited    Patient Stated Goals  Go back to work, increase hip strength    Pain Onset  More than a month ago          Ther-Ex -Nustep L3 5 min for increased quad demand - Mini squat with red tband at knees 3x 10 at treadmill bar for balance support with min cuing for even wt distribution on LLE  - Sidestepping with red tband 3x 3820ft with maintaining mini squat position and min cuing for eccentric control  - OMEGA leg press (LLE ONLY)20# x 10; 25# 2x 10 with min cuing for eccentric contro - OMEGA hamstring curl (LLE ONLY) 25# 3x 10 with min cuing for eccentric control                       PT Education - 06/07/18 0922    Education Details  Exercise form    Person(s)  Educated  Patient    Methods  Explanation;Demonstration;Verbal cues    Comprehension  Verbalized understanding;Returned demonstration;Verbal cues required       PT Short Term Goals - 05/24/18 0920      PT SHORT TERM GOAL #1   Title  Pt will be independent with HEP in order to improve strength and balance in order to decrease fall risk and improve function at home and work.    Time  4    Period  Weeks    Status  New        PT Long Term Goals - 05/27/18 1101      PT LONG TERM GOAL #1   Title  Pt will decrease 5TSTS by at least 3 seconds in order to demonstrate clinically significant improvement in LE strength    Baseline  05/23/18 15sec    Time  8    Period  Weeks    Status  New      PT LONG TERM GOAL #2   Title  Pt will  increase by at least 0.13 m/s in order to demonstrate clinically significant improvement in community ambulation.    Baseline  05/23/18 0.28m/s    Time  8    Period  Weeks      PT LONG TERM GOAL #3   Title  Patient will increase FOTO score to 63 to demonstrate predicted increase in functional mobility to complete ADLs    Baseline  05/23/18 63    Time  8    Period  Weeks    Status  New            Plan - 06/07/18 1191    Clinical Impression Statement  PT continued to increase resistance with therex to increase L hip musculature demand. Patient tolerated therex well with required cuing for proper form.     Rehab Potential  Good    Clinical Impairments Affecting Rehab Potential  (-) sedentary lifestyle, obesity, chronicity of pain, other comorbidities (+) motivation, support system    PT Frequency  2x / week    PT Duration  8 weeks    PT Treatment/Interventions  Balance training;Gait training;Neuromuscular re-education;Passive range of motion;Aquatic Therapy;Moist Heat;Stair training;Manual techniques;Dry needling;Functional mobility training;Traction;Therapeutic activities;Patient/family education;Cryotherapy;Taping    PT Next Visit Plan  LE strengthening and balance; wound monitoring    PT Home Exercise Plan  bridge, clamshell, minisquat, hip abd    Consulted and Agree with Plan of Care  Patient       Patient will benefit from skilled therapeutic intervention in order to improve the following deficits and impairments:  Abnormal gait, Increased fascial restricitons, Improper body mechanics, Pain, Postural dysfunction, Impaired tone, Decreased mobility, Decreased endurance, Decreased activity tolerance, Decreased range of motion, Decreased strength, Decreased balance, Difficulty walking  Visit Diagnosis: Pain in left hip     Problem List Patient Active Problem List   Diagnosis Date Noted  . Avascular necrosis of left femoral head (HCC) 04/09/2018   Staci Acosta PT,  DPT Staci Acosta 06/07/2018, 10:18 AM  [facility] PHYS[facility] AND SPORTS MEDICINE [address], [place], [address] Phone: [phone]   Fax:  [phone]  Name: Viola Kinnick MRN: [id number] Date of Birth: [birth year]

## 2018-06-11 ENCOUNTER — Encounter: Payer: Self-pay | Admitting: Physical Therapy

## 2018-06-11 ENCOUNTER — Ambulatory Visit: Payer: BLUE CROSS/BLUE SHIELD | Admitting: Physical Therapy

## 2018-06-11 DIAGNOSIS — M25552 Pain in left hip: Secondary | ICD-10-CM | POA: Diagnosis not present

## 2018-06-11 NOTE — Therapy (Signed)
[facility] MEDICINE [address], [place], [address] Phone: [phone]   Fax:  [phone]  Physical Therapy Treatment  Patient Details  Name: Terri Wood MRN: [id number] Date of Birth: [birth year] Referring Provider: Dr. Rosita Kea   Encounter Date: 06/11/2018  PT End of Session - 06/11/18 0923    Visit Number  6    Number of Visits  17    Date for PT Re-Evaluation  07/18/18    PT Start Time  0915    PT Stop Time  0953    PT Time Calculation (min)  38 min    Activity Tolerance  Patient tolerated treatment well    Behavior During Therapy  [facility] for tasks assessed/performed       Past Medical History:  Diagnosis Date  . Anxiety   . Fibromyalgia   . Hypertension   . Osteoarthritis of left hip 2019    Past Surgical History:  Procedure Laterality Date  . ABDOMINAL SURGERY  1996   gastric bypass; stapling; surgilite  . PLANTAR FASCIA SURGERY Right 2009  . TOTAL HIP ARTHROPLASTY Left 04/09/2018   Procedure: TOTAL HIP ARTHROPLASTY ANTERIOR APPROACH;  Surgeon: Kennedy Bucker, MD;  Location: [facility] ORS;  Service: Orthopedics;  Laterality: Left;    There were no vitals filed for this visit.  Subjective Assessment - 06/11/18 0921    Subjective  Patient reports no hip pain, just some knee pain with the weather. Patient reports compliance with her HEP. Patient is happy to report that she did "a little dancing" Saturday night with no pain. Patient reports she is returning to work Thursday.     Pertinent History  Patient is a 52 year old female s/p L ant THA 04/09/18. Following surgery patient had home health PT for 2x/week for 3weeks, which she reports is mostly chair exercise. Patient reports she is "periodically using a cane" but is trying to not use AD if able. 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  Patient reports follow up with surgeon yesterday, and reports surgeon mentioned good  healing, but wanted patient to increase strength. Patient reports L hip pain worst 3/10, best 0/10. Patient reports most pain with transitional movements, but that she is mostly concerned about regaining strength. Currently, patient reports uphill/downhill walking, recumbant bike, stair stepper, and leg lifts. Pt denies N/V, unexplained weight fluctuation, saddle paresthesia, fever, B&B, night sweats, or unrelenting night pain at this time.    Limitations  Lifting;Standing;Walking    How long can you sit comfortably?  unlimited    How long can you stand comfortably?  unlimited    How long can you walk comfortably?  unlimited    Patient Stated Goals  Go back to work, increase hip strength    Pain Onset  More than a month ago         Ther-Ex - Mini squat w/ RLE on balance stone with 5# DB 3x 10; min cuing needed initially for proper form with good carry over following.  - Lunge with RLE posterior on balance stone and 5# DB 3x 10 with cuing for proper form initially for cuing to push through LLE - Multiple trials of STAR balance standing on LLE  - lateral step downs from 4in step 3x 10 with max cuing initially to prevent hip drop with good carry over following - SL bridge 3x 8/7/6 with cuing for pelvic and eccentric control, which patient is able  to comply with 75% - Nustep L4 for increased quad demand, with education to advance HEP bridge to SL, standing hip abd/ext with red tband, and mini squat to full squat                  PT Education - 06/11/18 0923    Education Details  Exercise form    Person(s) Educated  Patient    Methods  Explanation;Verbal cues;Demonstration    Comprehension  Verbalized understanding;Verbal cues required;Returned demonstration       PT Short Term Goals - 05/24/18 0920      PT SHORT TERM GOAL #1   Title  Pt will be independent with HEP in order to improve strength and balance in order to decrease fall risk and improve function at home and  work.    Time  4    Period  Weeks    Status  New        PT Long Term Goals - 05/27/18 1101      PT LONG TERM GOAL #1   Title  Pt will decrease 5TSTS by at least 3 seconds in order to demonstrate clinically significant improvement in LE strength    Baseline  05/23/18 15sec    Time  8    Period  Weeks    Status  New      PT LONG TERM GOAL #2   Title  Pt will increase by at least 0.13 m/s in order to demonstrate clinically significant improvement in community ambulation.    Baseline  05/23/18 0.35m/s    Time  8    Period  Weeks      PT LONG TERM GOAL #3   Title  Patient will increase FOTO score to 63 to demonstrate predicted increase in functional mobility to complete ADLs    Baseline  05/23/18 63    Time  8    Period  Weeks    Status  New            Plan - 06/11/18 0936    Clinical Impression Statement  PT continued to increase LLE demand with therex, with balance challenges. PT advanced patient's HEP to mirror gains made thus far in sessions. PT instructed patient to take note of things at her job that she has incresaed difficulty doing/is unable to do as she is returning to work this week. Patient verbalized understanding of all education provided.     Rehab Potential  Good    Clinical Impairments Affecting Rehab Potential  (-) sedentary lifestyle, obesity, chronicity of pain, other comorbidities (+) motivation, support system    PT Frequency  2x / week    PT Duration  8 weeks    PT Treatment/Interventions  Balance training;Gait training;Neuromuscular re-education;Passive range of motion;Aquatic Therapy;Moist Heat;Stair training;Manual techniques;Dry needling;Functional mobility training;Traction;Therapeutic activities;Patient/family education;Cryotherapy;Taping    PT Next Visit Plan  LE strengthening and balance; wound monitoring    PT Home Exercise Plan  SL bridge, clamshell, squat, standing hip abd and ext w/ red tband    Consulted and Agree with Plan of Care  Patient        Patient will benefit from skilled therapeutic intervention in order to improve the following deficits and impairments:  Abnormal gait, Increased fascial restricitons, Improper body mechanics, Pain, Postural dysfunction, Impaired tone, Decreased mobility, Decreased endurance, Decreased activity tolerance, Decreased range of motion, Decreased strength, Decreased balance, Difficulty walking  Visit Diagnosis: Pain in left hip     Problem List  Patient Active Problem List   Diagnosis Date Noted  . Avascular necrosis of left femoral head (HCC) 04/09/2018   Staci Acostahelsea Miller PT, DPT Staci Acostahelsea Miller 06/11/2018, 9:49 AM  [facility] [address] [place], [place], [id number] Phone: [phone]   Fax:  [phone]  Name: Terri Wood MRN: [id number] Date of Birth: 12/18/1965

## 2018-06-14 ENCOUNTER — Ambulatory Visit: Payer: BLUE CROSS/BLUE SHIELD | Admitting: Physical Therapy

## 2018-06-18 ENCOUNTER — Ambulatory Visit: Payer: BLUE CROSS/BLUE SHIELD | Attending: Orthopedic Surgery | Admitting: Physical Therapy

## 2018-06-20 ENCOUNTER — Ambulatory Visit: Payer: BLUE CROSS/BLUE SHIELD | Admitting: Physical Therapy

## 2018-06-24 ENCOUNTER — Encounter: Payer: BLUE CROSS/BLUE SHIELD | Admitting: Physical Therapy

## 2018-06-26 ENCOUNTER — Encounter: Payer: BLUE CROSS/BLUE SHIELD | Admitting: Physical Therapy

## 2018-11-08 DIAGNOSIS — M791 Myalgia, unspecified site: Secondary | ICD-10-CM | POA: Insufficient documentation

## 2018-11-08 DIAGNOSIS — F331 Major depressive disorder, recurrent, moderate: Secondary | ICD-10-CM | POA: Insufficient documentation

## 2019-03-31 ENCOUNTER — Other Ambulatory Visit: Payer: BLUE CROSS/BLUE SHIELD

## 2019-03-31 ENCOUNTER — Telehealth: Payer: Self-pay

## 2019-03-31 DIAGNOSIS — Z20822 Contact with and (suspected) exposure to covid-19: Secondary | ICD-10-CM

## 2019-03-31 NOTE — Telephone Encounter (Signed)
Contacted by [place] for patient Covid-19 testing. Dr Lamonte Sakai [facility] [phone] Fax [phone]  Call placed to patient. Pt scheduled and order placed.

## 2019-04-01 LAB — NOVEL CORONAVIRUS, NAA: SARS-CoV-2, NAA: NOT DETECTED

## 2019-04-07 DIAGNOSIS — E1165 Type 2 diabetes mellitus with hyperglycemia: Secondary | ICD-10-CM | POA: Insufficient documentation

## 2019-04-07 DIAGNOSIS — R7303 Prediabetes: Secondary | ICD-10-CM | POA: Insufficient documentation

## 2019-08-05 DIAGNOSIS — I639 Cerebral infarction, unspecified: Secondary | ICD-10-CM

## 2019-08-05 DIAGNOSIS — I6529 Occlusion and stenosis of unspecified carotid artery: Secondary | ICD-10-CM | POA: Insufficient documentation

## 2019-08-05 DIAGNOSIS — I679 Cerebrovascular disease, unspecified: Secondary | ICD-10-CM | POA: Insufficient documentation

## 2019-08-05 HISTORY — DX: Cerebral infarction, unspecified: I63.9

## 2019-08-05 HISTORY — DX: Occlusion and stenosis of unspecified carotid artery: I65.29

## 2019-08-15 ENCOUNTER — Emergency Department (HOSPITAL_COMMUNITY): Payer: BLUE CROSS/BLUE SHIELD

## 2019-08-15 ENCOUNTER — Other Ambulatory Visit: Payer: Self-pay

## 2019-08-15 ENCOUNTER — Inpatient Hospital Stay (HOSPITAL_COMMUNITY)
Admission: EM | Admit: 2019-08-15 | Discharge: 2019-08-21 | DRG: 065 | Disposition: A | Discharge status: Rehabilitation Facility | Payer: BLUE CROSS/BLUE SHIELD | Attending: Internal Medicine | Admitting: Internal Medicine

## 2019-08-15 DIAGNOSIS — I639 Cerebral infarction, unspecified: Secondary | ICD-10-CM | POA: Diagnosis present

## 2019-08-15 DIAGNOSIS — Z79899 Other long term (current) drug therapy: Secondary | ICD-10-CM | POA: Diagnosis not present

## 2019-08-15 DIAGNOSIS — E785 Hyperlipidemia, unspecified: Secondary | ICD-10-CM

## 2019-08-15 DIAGNOSIS — M797 Fibromyalgia: Secondary | ICD-10-CM | POA: Diagnosis present

## 2019-08-15 DIAGNOSIS — G8194 Hemiplegia, unspecified affecting left nondominant side: Secondary | ICD-10-CM | POA: Diagnosis present

## 2019-08-15 DIAGNOSIS — D72829 Elevated white blood cell count, unspecified: Secondary | ICD-10-CM | POA: Diagnosis present

## 2019-08-15 DIAGNOSIS — F329 Major depressive disorder, single episode, unspecified: Secondary | ICD-10-CM | POA: Diagnosis present

## 2019-08-15 DIAGNOSIS — G8929 Other chronic pain: Secondary | ICD-10-CM | POA: Diagnosis present

## 2019-08-15 DIAGNOSIS — Z96642 Presence of left artificial hip joint: Secondary | ICD-10-CM | POA: Diagnosis present

## 2019-08-15 DIAGNOSIS — Z791 Long term (current) use of non-steroidal anti-inflammatories (NSAID): Secondary | ICD-10-CM | POA: Diagnosis not present

## 2019-08-15 DIAGNOSIS — Z6841 Body Mass Index (BMI) 40.0 and over, adult: Secondary | ICD-10-CM | POA: Diagnosis not present

## 2019-08-15 DIAGNOSIS — Z9884 Bariatric surgery status: Secondary | ICD-10-CM | POA: Diagnosis not present

## 2019-08-15 DIAGNOSIS — R531 Weakness: Secondary | ICD-10-CM | POA: Diagnosis not present

## 2019-08-15 DIAGNOSIS — F17210 Nicotine dependence, cigarettes, uncomplicated: Secondary | ICD-10-CM | POA: Diagnosis present

## 2019-08-15 DIAGNOSIS — F418 Other specified anxiety disorders: Secondary | ICD-10-CM | POA: Diagnosis present

## 2019-08-15 DIAGNOSIS — I6389 Other cerebral infarction: Secondary | ICD-10-CM | POA: Diagnosis not present

## 2019-08-15 DIAGNOSIS — F17200 Nicotine dependence, unspecified, uncomplicated: Secondary | ICD-10-CM

## 2019-08-15 DIAGNOSIS — R7303 Prediabetes: Secondary | ICD-10-CM | POA: Diagnosis present

## 2019-08-15 DIAGNOSIS — R29810 Facial weakness: Secondary | ICD-10-CM | POA: Diagnosis present

## 2019-08-15 DIAGNOSIS — I63511 Cerebral infarction due to unspecified occlusion or stenosis of right middle cerebral artery: Secondary | ICD-10-CM | POA: Diagnosis not present

## 2019-08-15 DIAGNOSIS — I10 Essential (primary) hypertension: Secondary | ICD-10-CM | POA: Diagnosis present

## 2019-08-15 DIAGNOSIS — H53462 Homonymous bilateral field defects, left side: Secondary | ICD-10-CM | POA: Diagnosis present

## 2019-08-15 DIAGNOSIS — Z20828 Contact with and (suspected) exposure to other viral communicable diseases: Secondary | ICD-10-CM | POA: Diagnosis present

## 2019-08-15 DIAGNOSIS — R29704 NIHSS score 4: Secondary | ICD-10-CM | POA: Diagnosis present

## 2019-08-15 HISTORY — DX: Cerebral infarction due to unspecified occlusion or stenosis of right middle cerebral artery: I63.511

## 2019-08-15 LAB — CBC
HCT: 38.5 % (ref 36.0–46.0)
Hemoglobin: 12.1 g/dL (ref 12.0–15.0)
MCH: 27.3 pg (ref 26.0–34.0)
MCHC: 31.4 g/dL (ref 30.0–36.0)
MCV: 86.9 fL (ref 80.0–100.0)
Platelets: 227 10*3/uL (ref 150–400)
RBC: 4.43 MIL/uL (ref 3.87–5.11)
RDW: 17.7 % — ABNORMAL HIGH (ref 11.5–15.5)
WBC: 11 10*3/uL — ABNORMAL HIGH (ref 4.0–10.5)
nRBC: 0 % (ref 0.0–0.2)

## 2019-08-15 LAB — DIFFERENTIAL
Abs Immature Granulocytes: 0.03 10*3/uL (ref 0.00–0.07)
Basophils Absolute: 0 10*3/uL (ref 0.0–0.1)
Basophils Relative: 0 %
Eosinophils Absolute: 0 10*3/uL (ref 0.0–0.5)
Eosinophils Relative: 0 %
Immature Granulocytes: 0 %
Lymphocytes Relative: 19 %
Lymphs Abs: 2.1 10*3/uL (ref 0.7–4.0)
Monocytes Absolute: 1 10*3/uL (ref 0.1–1.0)
Monocytes Relative: 9 %
Neutro Abs: 7.8 10*3/uL — ABNORMAL HIGH (ref 1.7–7.7)
Neutrophils Relative %: 72 %

## 2019-08-15 LAB — I-STAT CHEM 8, ED
BUN: 27 mg/dL — ABNORMAL HIGH (ref 6–20)
Calcium, Ion: 1.19 mmol/L (ref 1.15–1.40)
Chloride: 103 mmol/L (ref 98–111)
Creatinine, Ser: 0.9 mg/dL (ref 0.44–1.00)
Glucose, Bld: 70 mg/dL (ref 70–99)
HCT: 38 % (ref 36.0–46.0)
Hemoglobin: 12.9 g/dL (ref 12.0–15.0)
Potassium: 5 mmol/L (ref 3.5–5.1)
Sodium: 137 mmol/L (ref 135–145)
TCO2: 26 mmol/L (ref 22–32)

## 2019-08-15 LAB — I-STAT BETA HCG BLOOD, ED (MC, WL, AP ONLY): I-stat hCG, quantitative: <5 m[IU]/mL (ref <5)

## 2019-08-15 LAB — CBG MONITORING, ED: Glucose-Capillary: 84 mg/dL (ref 70–99)

## 2019-08-15 MED ORDER — IOHEXOL 350 MG/ML SOLN
100.0000 mL | Freq: Once | INTRAVENOUS | Status: AC | PRN
  Administered 2019-08-15: 100 mL via INTRAVENOUS

## 2019-08-15 MED ORDER — ASPIRIN EC 81 MG PO TBEC
81.0000 mg | DELAYED_RELEASE_TABLET | Freq: Every day | ORAL | Status: DC
  Administered 2019-08-15 – 2019-08-16 (×2): 81 mg via ORAL
  Filled 2019-08-15 (×2): qty 1

## 2019-08-15 MED ORDER — SODIUM CHLORIDE 0.9% FLUSH
3.0000 mL | Freq: Once | INTRAVENOUS | Status: AC
  Administered 2019-08-15: 3 mL via INTRAVENOUS

## 2019-08-15 MED ORDER — CLOPIDOGREL BISULFATE 300 MG PO TABS
300.0000 mg | ORAL_TABLET | Freq: Once | ORAL | Status: AC
  Administered 2019-08-15: 300 mg via ORAL
  Filled 2019-08-15: qty 1

## 2019-08-15 MED ORDER — CLOPIDOGREL BISULFATE 75 MG PO TABS
75.0000 mg | ORAL_TABLET | Freq: Every day | ORAL | Status: DC
  Administered 2019-08-16 – 2019-08-21 (×6): 75 mg via ORAL
  Filled 2019-08-15 (×6): qty 1

## 2019-08-15 NOTE — ED Provider Notes (Signed)
[facility] EMERGENCY DEPARTMENT Provider Note   CSN: [id number] Arrival date & time: 08/15/19  2014     History   Chief Complaint Chief Complaint  Patient presents with  . Code Stroke    HPI Terri Wood is a 53 y.o. female.     HPI 53 year old female with history of fibromyalgia, hypertension and extensive smoking history comes in a chief complaint of left-sided weakness.  Patient is last known normal at 2 PM.  She reports that she started noticing weakness in her left upper extremity, specifically with grip.  Over time her strength got worse.  She also started noticing some weakness in the legs later in the evening which prompted them to call EMS.  She denies any associated slurring of the speech, vision change, facial droop, focal numbness.   Past Medical History:  Diagnosis Date  . Anxiety   . Fibromyalgia   . Hypertension   . Osteoarthritis of left hip 2019    Patient Active Problem List   Diagnosis Date Noted  . Avascular necrosis of left femoral head (HCC) 04/09/2018    Past Surgical History:  Procedure Laterality Date  . ABDOMINAL SURGERY  1996   gastric bypass; stapling; surgilite  . PLANTAR FASCIA SURGERY Right 2009  . TOTAL HIP ARTHROPLASTY Left 04/09/2018   Procedure: TOTAL HIP ARTHROPLASTY ANTERIOR APPROACH;  Surgeon: Kennedy Bucker, MD;  Location: [facility] ORS;  Service: Orthopedics;  Laterality: Left;     OB History    Gravida  0   Para  0   Term  0   Preterm  0   AB  0   Living        SAB  0   TAB  0   Ectopic  0   Multiple      Live Births               Home Medications    Prior to Admission medications   Medication Sig Start Date End Date Taking? Authorizing Provider  amLODipine (NORVASC) 10 MG tablet Take 10 mg by mouth daily. 11/13/17   [provider]  Calcium Carbonate-Vit D-Min (CALTRATE 600+D PLUS PO) Take 1 tablet by mouth daily.    [provider]  clonazePAM (KLONOPIN) 0.5 MG  tablet Take 1 tablet by mouth 3 (three) times daily as needed for anxiety.  12/11/14   [provider]  DULoxetine (CYMBALTA) 60 MG capsule Take 60 mg by mouth daily. 03/13/18   [provider]  enoxaparin (LOVENOX) 40 MG/0.4ML injection Inject 0.4 mLs (40 mg total) into the skin daily for 14 days. 04/12/18 04/26/18  Evon Slack, PA-C  gabapentin (NEURONTIN) 300 MG capsule Take 600 mg by mouth 3 (three) times daily.  02/02/15   [provider]  HYDROcodone-acetaminophen (NORCO/VICODIN) 5-325 MG tablet Take 1-2 tablets by mouth every 4 (four) hours as needed for moderate pain (pain score 4-6). Patient not taking: Reported on 05/23/2018 04/11/18   Evon Slack, PA-C  lisinopril-hydrochlorothiazide (PRINZIDE,ZESTORETIC) 20-25 MG tablet Take 1 tablet by mouth daily. 01/01/18   [provider]  Multiple Vitamins-Minerals (MULTI FOR HER 50+ PO) Take 1 tablet by mouth daily.    [provider]  Omega 3 1000 MG CAPS Take 1 tablet by mouth every morning.    [provider]  PARoxetine (PAXIL) 20 MG tablet Take 20 mg by mouth daily. 03/14/18   [provider]  tiZANidine (ZANAFLEX) 4 MG tablet Take 2-4 mg by mouth  2 (two) times daily as needed for muscle spasms.  01/27/18   [provider]    Family History Family History  Problem Relation Age of Onset  . Aneurysm Mother   . Heart disease Father     Social History Social History   Tobacco Use  . Smoking status: Current Every Day Smoker    Packs/day: 1.00    Types: Cigarettes  . Smokeless tobacco: Never Used  Substance Use Topics  . Alcohol use: Yes    Comment: occassionally  . Drug use: Yes    Types: Marijuana    Comment: last week     Allergies   Penicillins   Review of Systems Review of Systems  Constitutional: Positive for activity change.  Respiratory: Negative for shortness of breath.   Cardiovascular: Negative for chest pain.  Gastrointestinal: Negative  for abdominal pain.  Neurological: Positive for weakness. Negative for numbness and headaches.  All other systems reviewed and are negative.    Physical Exam Updated Vital Signs BP (!) 148/86   Pulse 98   Temp 97.8 F (36.6 C) (Oral)   Resp 18   Ht 5' 0.5" (1.537 m)   Wt 106.6 kg   SpO2 99%   BMI 45.14 kg/m   Physical Exam Vitals signs and nursing note reviewed.  Constitutional:      Appearance: She is well-developed.  HENT:     Head: Normocephalic and atraumatic.  Eyes:     Pupils: Pupils are equal, round, and reactive to light.  Neck:     Musculoskeletal: Neck supple.  Cardiovascular:     Rate and Rhythm: Normal rate and regular rhythm.     Heart sounds: Normal heart sounds. No murmur.  Pulmonary:     Effort: Pulmonary effort is normal. No respiratory distress.  Abdominal:     General: There is no distension.     Palpations: Abdomen is soft.     Tenderness: There is no abdominal tenderness. There is no guarding or rebound.  Skin:    General: Skin is warm and dry.  Neurological:     Mental Status: She is alert and oriented to person, place, and time.     Cranial Nerves: No cranial nerve deficit.     Sensory: No sensory deficit.     Motor: Weakness present.     Comments: Left upper extremity weakness noted.  Patient has subjective weakness to the left lower extremity -but on our evaluation the strength is 4+ out of 5.       ED Treatments / Results  Labs (all labs ordered are listed, but only abnormal results are displayed) Labs Reviewed  SARS CORONAVIRUS 2 (TAT 6-24 HRS)  PROTIME-INR  APTT  CBC  DIFFERENTIAL  COMPREHENSIVE METABOLIC PANEL  I-STAT BETA HCG BLOOD, ED ([facility], [facility], [facility] ONLY)  I-STAT CHEM 8, ED  CBG MONITORING, ED    EKG None  Radiology Ct Angio Head W Or Wo Contrast  Result Date: 08/15/2019 CLINICAL DATA:  Code stroke. Neuro deficit for greater than 6 hours. Left-sided weakness. Last seen normal 7.5 hours ago. EXAM: CT ANGIOGRAPHY HEAD  AND NECK TECHNIQUE: Multidetector CT imaging of the head and neck was performed using the standard protocol during bolus administration of intravenous contrast. Multiplanar CT image reconstructions and MIPs were obtained to evaluate the vascular anatomy. Carotid stenosis measurements (when applicable) are obtained utilizing NASCET criteria, using the distal internal carotid diameter as the denominator. CONTRAST:  OMNIPAQUE IOHEXOL 350 MG/ML SOLN COMPARISON:  CT  head without contrast 08/15/2019 FINDINGS: CTA NECK FINDINGS Aortic arch: A 3 vessel arch configuration is present. Minimal distal atherosclerotic changes are present. There is no significant stenosis or aneurysm. Right carotid system: The right common carotid artery is within normal limits. The right internal carotid artery is occluded at the bifurcation. There is no reconstitution in the neck. Left carotid system: The left common carotid artery is within normal limits. Mild atherosclerotic changes are noted at the bifurcation. There is some tortuosity of the cervical left ICA without a significant stenosis. Vertebral arteries: The left vertebral artery is slightly dominant to the right. Both vertebral arteries originate from the subclavian arteries without significant stenosis. There is no significant stenosis of either vertebral artery in the neck. 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 Vertebral body heights and alignment are maintained. No focal lytic or blastic lesions are present. Other neck: The soft tissues the neck are unremarkable. No focal mucosal or submucosal lesions are present. Salivary glands are within normal limits. No significant adenopathy is present. Thyroid is normal. Upper chest: Lung apices demonstrate mild diffuse ground-glass attenuation. No focal nodule, mass, or airspace disease present. The thoracic inlet is normal. Review of the MIP images confirms the above findings CTA HEAD FINDINGS Anterior circulation: The right  internal carotid artery is not reconstituted significantly below the ICA termini. There is minimal filling of the supraclinoid right ICA. The anterior communicating artery is patent. Study is mildly degraded by patient motion and skull base. The left internal carotid artery is unremarkable through the ICA terminus. The A1 and M1 segments are normal. Motion artifact obscures portions of the left M1 segment. Bifurcations are normal. There is attenuation of distal branch vessels on the right consistent with the infarct territory. No significant proximal anterior occlusion is present. There is some attenuation of distal ACA branches without a significant proximal stenosis or occlusion. Posterior circulation: The left vertebral artery is slightly dominant. Vertebrobasilar junction is normal. Basilar artery is normal. Both posterior cerebral arteries originate from the basilar tip. The PCA branch vessels are within normal limits. Venous sinuses: The dural sinuses are patent. Anatomic variants: None Review of the MIP images confirms the above findings IMPRESSION: 1. 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 2. Marked attenuation of distal posterior right MCA branches compatible with the more remote infarct. 3. No significant anterior stenosis or occlusion. 4. Left MCA branches are normal. 5. Posterior circulation is unremarkable. Electronically Signed   By: San Morelle M.D.   On: 08/15/2019 21:43   Ct Angio Neck W Or Wo Contrast  Result Date: 08/15/2019 CLINICAL DATA:  Code stroke. Neuro deficit for greater than 6 hours. Left-sided weakness. Last seen normal 7.5 hours ago. EXAM: CT ANGIOGRAPHY HEAD AND NECK TECHNIQUE: Multidetector CT imaging of the head and neck was performed using the standard protocol during bolus administration of intravenous contrast. Multiplanar CT image reconstructions and MIPs were obtained to  evaluate the vascular anatomy. Carotid stenosis measurements (when applicable) are obtained utilizing NASCET criteria, using the distal internal carotid diameter as the denominator. CONTRAST:  170mL OMNIPAQUE IOHEXOL 350 MG/ML SOLN COMPARISON:  CT head without contrast 08/15/2019 FINDINGS: CTA NECK FINDINGS Aortic arch: A 3 vessel arch configuration is present. Minimal distal atherosclerotic changes are present. There is no significant stenosis or aneurysm. Right carotid system: The right common carotid artery is within normal limits. The right internal carotid artery is occluded at the bifurcation. There  is no reconstitution in the neck. Left carotid system: The left common carotid artery is within normal limits. Mild atherosclerotic changes are noted at the bifurcation. There is some tortuosity of the cervical left ICA without a significant stenosis. Vertebral arteries: The left vertebral artery is slightly dominant to the right. Both vertebral arteries originate from the subclavian arteries without significant stenosis. There is no significant stenosis of either vertebral artery in the neck. 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 Vertebral body heights and alignment are maintained. No focal lytic or blastic lesions are present. Other neck: The soft tissues the neck are unremarkable. No focal mucosal or submucosal lesions are present. Salivary glands are within normal limits. No significant adenopathy is present. Thyroid is normal. Upper chest: Lung apices demonstrate mild diffuse ground-glass attenuation. No focal nodule, mass, or airspace disease present. The thoracic inlet is normal. Review of the MIP images confirms the above findings CTA HEAD FINDINGS Anterior circulation: The right internal carotid artery is not reconstituted significantly below the ICA termini. There is minimal filling of the supraclinoid right ICA. The anterior communicating artery is patent. Study is mildly degraded by patient  motion and skull base. The left internal carotid artery is unremarkable through the ICA terminus. The A1 and M1 segments are normal. Motion artifact obscures portions of the left M1 segment. Bifurcations are normal. There is attenuation of distal branch vessels on the right consistent with the infarct territory. No significant proximal anterior occlusion is present. There is some attenuation of distal ACA branches without a significant proximal stenosis or occlusion. Posterior circulation: The left vertebral artery is slightly dominant. Vertebrobasilar junction is normal. Basilar artery is normal. Both posterior cerebral arteries originate from the basilar tip. The PCA branch vessels are within normal limits. Venous sinuses: The dural sinuses are patent. Anatomic variants: None Review of the MIP images confirms the above findings IMPRESSION: 1. 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 2. Marked attenuation of distal posterior right MCA branches compatible with the more remote infarct. 3. No significant anterior stenosis or occlusion. 4. Left MCA branches are normal. 5. Posterior circulation is unremarkable. Electronically Signed   By: Marin Roberts M.D.   On: 08/15/2019 21:43   Ct Head Code Stroke Wo Contrast`  Result Date: 08/15/2019 CLINICAL DATA:  Code stroke. Neuro deficit for less than 6 hours. Left-sided weakness. EXAM: CT HEAD WITHOUT CONTRAST TECHNIQUE: Contiguous axial images were obtained from the base of the skull through the vertex without intravenous contrast. COMPARISON:  CT head without contrast 04/28/2015 FINDINGS: Brain: The posterior right MCA territory infarct is new since the prior study, but not acute. There is volume loss with ex vacuo dilation of the right lateral ventricle. There is subtle changes in the super ganglionic right precentral gyrus. Basal ganglia are intact. Insular ribbon is normal.  No significant extra-axial fluid collection is present. There is loss of gray-white differentiation more anteriorly in the high right frontal lobe as well. Left hemisphere is unremarkable. Insert normal brainstem Vascular: No hyperdense vessel or unexpected calcification. Skull: Calvarium is intact. No focal lytic or blastic lesions are present. Sinuses/Orbits: The globes and orbits are within normal limits. The paranasal sinuses and mastoid air cells are clear. ASPECTS [facility] Stroke Program Early CT Score) - Ganglionic level infarction (caudate, lentiform nuclei, internal capsule, insula, M1-M3 cortex): 7/7 - Supraganglionic infarction (M4-M6 cortex): 1/3 Total score (0-10 with 10 being normal): 8/10 IMPRESSION: 1. Areas  of acute/subacute nonhemorrhagic infarct in the high right frontal lobe are new since the prior study. 2. New but remote posterior right MCA territory infarcts. 3. No acute vascular lesion. 4. ASPECTS is 8/10 The above was relayed via text pager to Dr. Arther DamesSUSHANTH AROOR on 08/15/2019 at 20:44 . Electronically Signed   By: Marin Robertshristopher  Mattern M.D.   On: 08/15/2019 20:45    Procedures Procedures (including critical care time)  Medications Ordered in ED Medications  sodium chloride flush (NS) 0.9 % injection 3 mL (has no administration in time range)  clopidogrel (PLAVIX) tablet 300 mg (has no administration in time range)  aspirin EC tablet 81 mg (has no administration in time range)  clopidogrel (PLAVIX) tablet 75 mg (has no administration in time range)  iohexol (OMNIPAQUE) 350 MG/ML injection 100 mL (100 mLs Intravenous Contrast Given 08/15/19 2040)     Initial Impression / Assessment and Plan / ED Course  I have reviewed the triage vital signs and the nursing notes.  Pertinent labs & imaging results that were available during my care of the patient were reviewed by me and considered in my medical decision making (see chart for details).  Clinical Course as of Aug 14 2158   Caleen EssexFri Aug 15, 2019  2159 CT scan confirms ischemic stroke. Results of the ER work-up discussed with the patient.  Neuro team is recommending admission.  Patient is not a TPA candidate as she is outside the treatment window.  The clot is thought to be chronic and not acute.   [AN]    Clinical Course User Index [AN] Derwood KaplanNanavati, , MD       53 year old comes in a chief complaint of left-sided weakness.  She has history of hypertension and heavy smoking history.  On exam she has left extremity weakness and subjective weakness in the left lower extremity as well.  Concerns are high for stroke.  Stroke team assessing the patient.  Final Clinical Impressions(s) / ED Diagnoses   Final diagnoses:  Acute ischemic stroke [facility](HCC)    ED Discharge Orders    None       Derwood KaplanNanavati, , MD 08/15/19 2159

## 2019-08-15 NOTE — ED Notes (Signed)
RANDALL (HUSBAND): [phone]

## 2019-08-15 NOTE — Consult Note (Signed)
Requesting Physician: Dr. Kathy Breach    Chief Complaint: Fall, left-sided weakness  History obtained from: Patient and Chart     HPI:                                                                                                                                       Terri Wood is a 53 y.o. female with past medical history of anxiety, fibromyalgia, hypertension and left hip osteoarthritis, obesity presents to the emergency department as a code stroke for left arm and leg weakness.  Patient was last known normal at 2 PM patient states that her left arm and leg suddenly became weak.  Around 7 PM when patient went to walk she had a fall and EMS was alerted initially the call was placed for a fall. On their assessment, the patient was flaccid in the left upper extremity and significantly weak in the left lower extremity.  Blood pressure was 105 systolic.  Blood sugar was normal  Arrival to [facility] ER,her NIHSS was 4  (3 4 left arm weakness and 1 for partial hemianopsia) she underwent stat CT head.  CT head showed acute right frontal infarct and chronic right parieto-occipital lobe infarct.  CT angiogram was performed which showed an occluded right carotid artery with recannulization in the cavernous segment.  Right carotid occlusion likely chronic given chronic right parietal stroke.  Patient not on any antiplatelets at home.  She does use tobacco.  Denies any history of neck trauma.  Date last known well: 10.30. 20 Time last known well: 2 PM tPA Given: No, outside TPA window NIHSS: 4 Baseline MRS 0  Past Medical History:  Diagnosis Date  . Anxiety   . Fibromyalgia   . Hypertension   . Osteoarthritis of left hip 2019    Past Surgical History:  Procedure Laterality Date  . ABDOMINAL SURGERY  1996   gastric bypass; stapling; surgilite  . PLANTAR FASCIA SURGERY Right 2009  . TOTAL HIP ARTHROPLASTY Left 04/09/2018   Procedure: TOTAL HIP ARTHROPLASTY ANTERIOR APPROACH;   Surgeon: Kennedy Bucker, MD;  Location: [facility] ORS;  Service: Orthopedics;  Laterality: Left;    Family History  Problem Relation Age of Onset  . Aneurysm Mother   . Heart disease Father    Social History:  reports that she has been smoking cigarettes. She has been smoking about 1.00 pack per day. She has never used smokeless tobacco. She reports current alcohol use. She reports current drug use. Drug: Marijuana.  Allergies:  Allergies  Allergen Reactions  . Penicillins Hives    Has patient had a PCN reaction causing immediate rash, facial/tongue/throat swelling, SOB or lightheadedness with hypotension: Yes Has patient had a PCN reaction causing severe rash involving mucus membranes or skin necrosis: No Has patient had a PCN reaction that required hospitalization: No Has patient had a PCN reaction occurring within the last 10 years: Yes If  all of the above answers are "NO", then may proceed with Cephalosporin use.    Medications:                                                                                                                        I reviewed home medications   ROS:                                                                                                                                     14 systems reviewed and negative except above    Examination:                                                                                                      General: Appears well-developed, obese Psych: Affect appropriate to situation Eyes: No scleral injection HENT: No OP obstrucion Head: Normocephalic.  Cardiovascular: Normal rate and regular rhythm.  Respiratory: Effort normal and breath sounds normal to anterior ascultation GI: Soft.  No distension. There is no tenderness.  Skin: WDI    Neurological Examination Mental Status: Alert, oriented, thought content appropriate.  Speech fluent without evidence of aphasia. Able to follow 3 step commands without  difficulty. Cranial Nerves: II: Visual fields : Left inferior quadrantanopsia III,IV, VI: ptosis not present, extra-ocular motions intact bilaterally, pupils equal, round, reactive to light and accommodation V,VII: smile symmetric, facial light touch sensation normal bilaterally VIII: hearing normal bilaterally IX,X: uvula rises symmetrically XI: bilateral shoulder shrug XII: midline tongue extension Motor: Right : Upper extremity   5/5    Left:     Upper extremity   2/5  Lower extremity   5/5     Lower extremity   4/5 Tone and bulk:normal tone throughout; no atrophy noted Sensory: Pinprick and light touch intact throughout, bilaterally Deep Tendon Reflexes: 2+ and symmetric throughout Plantars: Right: downgoing   Left: downgoing Cerebellar: normal finger-to-nose, normal rapid alternating movements and normal heel-to-shin test Gait: normal gait and station  Lab Results: Basic Metabolic Panel: No results for input(s): NA, K, CL, CO2, GLUCOSE, BUN, CREATININE, CALCIUM, MG, PHOS in the last 168 hours.  CBC: No results for input(s): WBC, NEUTROABS, HGB, HCT, MCV, PLT in the last 168 hours.  Coagulation Studies: No results for input(s): LABPROT, INR in the last 72 hours.  Imaging: Ct Head Code Stroke Wo Contrast`  Result Date: 08/15/2019 CLINICAL DATA:  Code stroke. Neuro deficit for less than 6 hours. Left-sided weakness. EXAM: CT HEAD WITHOUT CONTRAST TECHNIQUE: Contiguous axial images were obtained from the base of the skull through the vertex without intravenous contrast. COMPARISON:  CT head without contrast 04/28/2015 FINDINGS: Brain: The posterior right MCA territory infarct is new since the prior study, but not acute. There is volume loss with ex vacuo dilation of the right lateral ventricle. There is subtle changes in the super ganglionic right precentral gyrus. Basal ganglia are intact. Insular ribbon is normal. No significant extra-axial fluid collection is present.  There is loss of gray-white differentiation more anteriorly in the high right frontal lobe as well. Left hemisphere is unremarkable. Insert normal brainstem Vascular: No hyperdense vessel or unexpected calcification. Skull: Calvarium is intact. No focal lytic or blastic lesions are present. Sinuses/Orbits: The globes and orbits are within normal limits. The paranasal sinuses and mastoid air cells are clear. ASPECTS [facility]([place] Stroke Program Early CT Score) - Ganglionic level infarction (caudate, lentiform nuclei, internal capsule, insula, M1-M3 cortex): 7/7 - Supraganglionic infarction (M4-M6 cortex): 1/3 Total score (0-10 with 10 being normal): 8/10 IMPRESSION: 1. Areas of acute/subacute nonhemorrhagic infarct in the high right frontal lobe are new since the prior study. 2. New but remote posterior right MCA territory infarcts. 3. No acute vascular lesion. 4. ASPECTS is 8/10 The above was relayed via text pager to Dr. Arther DamesSUSHANTH  on 08/15/2019 at 20:44 . Electronically Signed   By: Marin Robertshristopher  Mattern M.D.   On: 08/15/2019 20:45     ASSESSMENT AND PLAN  53 y.o. female with past medical history of anxiety, fibromyalgia, hypertension and left hip osteoarthritis, obesity presents to the emergency department as a code stroke for left arm and leg weakness secondary to right frontal stroke, likely embolization from carotid occlusion/severe stenosis.  Not a candidate for TPA as she is outside the window and not a candidate for thrombectomy as there is no intracranial LVO.   Acute right frontal stroke Chronic right carotid occlusion  Recommend # MRI of the brain without contrast #Consider Interventional neuroradiology consult for diagnostic angiogram to identify if there is trickle flow in the right carotid (if not completely occluded, would be a candidate for carotid stenting or CEA)  #Transthoracic Echo  #Start patient on aspirin 81 mg and Plavix 75 mg  (after 300 mg Plavix load) #Start  Atorvastatin 40  mg/other high intensity statin # BP goal: permissive HTN upto 220/120 mmHg # HBAIC and Lipid profile # Telemetry monitoring # Frequent neuro checks #  stroke swallow screen  Please page stroke NP  Or  PA  Or MD from 8am -4 pm  as this patient from this time will be  followed by the stroke.   You can look them up on [email or web address]  Password [email or web address]     [facility] Pager Number [phone]

## 2019-08-15 NOTE — ED Triage Notes (Signed)
Patient arrives via EMS due to c/o new onset stroke sx that started today around 1400. Per patient, she began to have SOB and weakness in her left arm. Pt then fell at home, did not hit her head.    LKW at 1400. Arrives with 20g IV R. AC. Left arm flaccid, but has sensation.

## 2019-08-16 ENCOUNTER — Inpatient Hospital Stay (HOSPITAL_COMMUNITY): Payer: BLUE CROSS/BLUE SHIELD

## 2019-08-16 ENCOUNTER — Other Ambulatory Visit: Payer: Self-pay

## 2019-08-16 ENCOUNTER — Encounter (HOSPITAL_COMMUNITY): Payer: Self-pay | Admitting: Internal Medicine

## 2019-08-16 DIAGNOSIS — I10 Essential (primary) hypertension: Secondary | ICD-10-CM

## 2019-08-16 DIAGNOSIS — I639 Cerebral infarction, unspecified: Secondary | ICD-10-CM

## 2019-08-16 DIAGNOSIS — M797 Fibromyalgia: Secondary | ICD-10-CM | POA: Diagnosis present

## 2019-08-16 DIAGNOSIS — I6389 Other cerebral infarction: Secondary | ICD-10-CM | POA: Diagnosis not present

## 2019-08-16 DIAGNOSIS — F17200 Nicotine dependence, unspecified, uncomplicated: Secondary | ICD-10-CM

## 2019-08-16 DIAGNOSIS — E785 Hyperlipidemia, unspecified: Secondary | ICD-10-CM

## 2019-08-16 DIAGNOSIS — I779 Disorder of arteries and arterioles, unspecified: Secondary | ICD-10-CM

## 2019-08-16 HISTORY — DX: Disorder of arteries and arterioles, unspecified: I77.9

## 2019-08-16 LAB — RAPID URINE DRUG SCREEN, HOSP PERFORMED
Amphetamines: NOT DETECTED
Barbiturates: NOT DETECTED
Benzodiazepines: NOT DETECTED
Cocaine: NOT DETECTED
Opiates: NOT DETECTED
Tetrahydrocannabinol: NOT DETECTED

## 2019-08-16 LAB — COMPREHENSIVE METABOLIC PANEL
ALT: 23 U/L (ref 0–44)
AST: 20 U/L (ref 15–41)
Albumin: 3.4 g/dL — ABNORMAL LOW (ref 3.5–5.0)
Alkaline Phosphatase: 52 U/L (ref 38–126)
Anion gap: 11 (ref 5–15)
BUN: 16 mg/dL (ref 6–20)
CO2: 25 mmol/L (ref 22–32)
Calcium: 9.1 mg/dL (ref 8.9–10.3)
Chloride: 103 mmol/L (ref 98–111)
Creatinine, Ser: 0.87 mg/dL (ref 0.44–1.00)
GFR calc Af Amer: >60 mL/min (ref >60)
GFR calc non Af Amer: >60 mL/min (ref >60)
Glucose, Bld: 92 mg/dL (ref 70–99)
Potassium: 4 mmol/L (ref 3.5–5.1)
Sodium: 139 mmol/L (ref 135–145)
Total Bilirubin: 0.4 mg/dL (ref 0.3–1.2)
Total Protein: 6.2 g/dL — ABNORMAL LOW (ref 6.5–8.1)

## 2019-08-16 LAB — APTT: aPTT: 29 seconds (ref 24–36)

## 2019-08-16 LAB — CBC
HCT: 39.1 % (ref 36.0–46.0)
Hemoglobin: 12.4 g/dL (ref 12.0–15.0)
MCH: 27.3 pg (ref 26.0–34.0)
MCHC: 31.7 g/dL (ref 30.0–36.0)
MCV: 85.9 fL (ref 80.0–100.0)
Platelets: 369 10*3/uL (ref 150–400)
RBC: 4.55 MIL/uL (ref 3.87–5.11)
RDW: 17.7 % — ABNORMAL HIGH (ref 11.5–15.5)
WBC: 11.5 10*3/uL — ABNORMAL HIGH (ref 4.0–10.5)
nRBC: 0 % (ref 0.0–0.2)

## 2019-08-16 LAB — ECHOCARDIOGRAM COMPLETE
Height: 60.5 in
Weight: 3760 oz

## 2019-08-16 LAB — PROTIME-INR
INR: 1 (ref 0.8–1.2)
Prothrombin Time: 12.8 seconds (ref 11.4–15.2)

## 2019-08-16 LAB — LIPID PANEL
Cholesterol: 186 mg/dL (ref 0–200)
HDL: 52 mg/dL (ref >40)
LDL Cholesterol: 108 mg/dL — ABNORMAL HIGH (ref 0–99)
Total CHOL/HDL Ratio: 3.6 RATIO
Triglycerides: 128 mg/dL (ref <150)
VLDL: 26 mg/dL (ref 0–40)

## 2019-08-16 LAB — RAPID HIV SCREEN (HIV 1/2 AB+AG)
HIV 1/2 Antibodies: NONREACTIVE
HIV-1 P24 Antigen - HIV24: NONREACTIVE

## 2019-08-16 LAB — CREATININE, SERUM
Creatinine, Ser: 0.89 mg/dL (ref 0.44–1.00)
GFR calc Af Amer: >60 mL/min (ref >60)
GFR calc non Af Amer: >60 mL/min (ref >60)

## 2019-08-16 LAB — HEMOGLOBIN A1C
Hgb A1c MFr Bld: 6.1 % — ABNORMAL HIGH (ref 4.8–5.6)
Mean Plasma Glucose: 128.37 mg/dL

## 2019-08-16 LAB — SEDIMENTATION RATE: Sed Rate: 6 mm/hr (ref 0–22)

## 2019-08-16 LAB — RPR
RPR Ser Ql: REACTIVE — AB
RPR Titer: 1:1 {titer}

## 2019-08-16 LAB — HIV ANTIBODY (ROUTINE TESTING W REFLEX): HIV Screen 4th Generation wRfx: NONREACTIVE

## 2019-08-16 LAB — C-REACTIVE PROTEIN: CRP: <0.8 mg/dL (ref <1.0)

## 2019-08-16 LAB — SARS CORONAVIRUS 2 (TAT 6-24 HRS): SARS Coronavirus 2: NEGATIVE

## 2019-08-16 MED ORDER — DULOXETINE HCL 60 MG PO CPEP
60.0000 mg | ORAL_CAPSULE | Freq: Every day | ORAL | Status: DC
  Administered 2019-08-16 – 2019-08-21 (×6): 60 mg via ORAL
  Filled 2019-08-16 (×6): qty 1

## 2019-08-16 MED ORDER — TIZANIDINE HCL 4 MG PO TABS
2.0000 mg | ORAL_TABLET | Freq: Once | ORAL | Status: AC
  Administered 2019-08-16: 2 mg via ORAL
  Filled 2019-08-16: qty 1

## 2019-08-16 MED ORDER — ASPIRIN EC 325 MG PO TBEC
325.0000 mg | DELAYED_RELEASE_TABLET | Freq: Every day | ORAL | Status: DC
  Administered 2019-08-17 – 2019-08-21 (×5): 325 mg via ORAL
  Filled 2019-08-16 (×5): qty 1

## 2019-08-16 MED ORDER — STROKE: EARLY STAGES OF RECOVERY BOOK
Freq: Once | Status: AC
  Administered 2019-08-16: 05:00:00
  Filled 2019-08-16: qty 1

## 2019-08-16 MED ORDER — CLONAZEPAM 0.125 MG PO TBDP: 0.1250 mg | ORAL_TABLET | Freq: Three times a day (TID) | ORAL | Status: DC | PRN

## 2019-08-16 MED ORDER — OMEGA-3-ACID ETHYL ESTERS 1 G PO CAPS
1000.0000 mg | ORAL_CAPSULE | Freq: Every day | ORAL | Status: DC
  Administered 2019-08-16 – 2019-08-21 (×6): 1000 mg via ORAL
  Filled 2019-08-16 (×6): qty 1

## 2019-08-16 MED ORDER — BACLOFEN 10 MG PO TABS: 10.0000 mg | ORAL_TABLET | Freq: Two times a day (BID) | ORAL | Status: DC | PRN

## 2019-08-16 MED ORDER — SODIUM CHLORIDE 0.9 % IV SOLN
INTRAVENOUS | Status: AC
  Administered 2019-08-16: 05:00:00 via INTRAVENOUS

## 2019-08-16 MED ORDER — ESCITALOPRAM OXALATE 10 MG PO TABS
10.0000 mg | ORAL_TABLET | Freq: Every day | ORAL | Status: DC
  Administered 2019-08-16 – 2019-08-21 (×6): 10 mg via ORAL
  Filled 2019-08-16 (×6): qty 1

## 2019-08-16 MED ORDER — PREGABALIN 75 MG PO CAPS
150.0000 mg | ORAL_CAPSULE | Freq: Two times a day (BID) | ORAL | Status: DC
  Administered 2019-08-16 – 2019-08-21 (×11): 150 mg via ORAL
  Filled 2019-08-16 (×11): qty 2

## 2019-08-16 MED ORDER — ATORVASTATIN CALCIUM 40 MG PO TABS
40.0000 mg | ORAL_TABLET | Freq: Every day | ORAL | Status: DC
  Administered 2019-08-16 – 2019-08-20 (×5): 40 mg via ORAL
  Filled 2019-08-16 (×4): qty 1

## 2019-08-16 MED ORDER — TIZANIDINE HCL 4 MG PO TABS
2.0000 mg | ORAL_TABLET | Freq: Every day | ORAL | Status: DC
  Administered 2019-08-16 – 2019-08-20 (×5): 2 mg via ORAL
  Filled 2019-08-16 (×5): qty 1

## 2019-08-16 MED ORDER — TRAMADOL HCL 50 MG PO TABS: 50.0000 mg | ORAL_TABLET | Freq: Two times a day (BID) | ORAL | Status: DC | PRN

## 2019-08-16 MED ORDER — ACETAMINOPHEN 160 MG/5ML PO SOLN: 650.0000 mg | ORAL | Status: DC | PRN

## 2019-08-16 MED ORDER — ACETAMINOPHEN 650 MG RE SUPP: 650.0000 mg | RECTAL | Status: DC | PRN

## 2019-08-16 MED ORDER — ENOXAPARIN SODIUM 40 MG/0.4ML ~~LOC~~ SOLN
40.0000 mg | SUBCUTANEOUS | Status: DC
  Administered 2019-08-16 – 2019-08-21 (×6): 40 mg via SUBCUTANEOUS
  Filled 2019-08-16 (×6): qty 0.4

## 2019-08-16 MED ORDER — ACETAMINOPHEN 325 MG PO TABS
650.0000 mg | ORAL_TABLET | ORAL | Status: DC | PRN
  Administered 2019-08-16 – 2019-08-17 (×2): 650 mg via ORAL
  Filled 2019-08-16 (×2): qty 2

## 2019-08-16 MED ORDER — HYDRALAZINE HCL 20 MG/ML IJ SOLN: 10.0000 mg | INTRAMUSCULAR | Status: DC | PRN

## 2019-08-16 MED ORDER — BUPROPION HCL ER (XL) 150 MG PO TB24
150.0000 mg | ORAL_TABLET | Freq: Every day | ORAL | Status: DC
  Administered 2019-08-16 – 2019-08-21 (×6): 150 mg via ORAL
  Filled 2019-08-16 (×6): qty 1

## 2019-08-16 NOTE — Progress Notes (Signed)
LE venous duplex and Carotid duplex studies have been completed. Preliminary results can be found under CV proc through chart review. June Leap, BS, RDMS, RVT

## 2019-08-16 NOTE — Evaluation (Signed)
Occupational Therapy Evaluation Patient Details Name: Terri Wood MRN: [id number] DOB: 02/16/1966 Today's Date: 08/16/2019    History of Present Illness Terri Wood is a 53 y.o. female with history of hypertension, fibromyalgia, anxiety, tobacco abuse presents to the ER after patient was noticed to have left upper and lower extremity weakness. 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    Clinical Impression   Pt admitted with above. She demonstrates the below listed deficits and will benefit from continued OT to maximize safety and independence with BADLs.  Pt presents to OT with Lt hemiparesis, Lt inattention, Lt visual field deficit, impaired balance.  She currently requires min - total A for ADLs, and mod A for functional transfers.  She lives with spouse and was fully independent with ADLs and IADLs including working and driving.  Recommend CIR level therapies as she will benefit from rehab program that specializes in CVA to allow her to maximize safety and independence with ADLs, reduce risk of falls and prevent readmission.       Follow Up Recommendations  CIR;Supervision/Assistance - 24 hour    Equipment Recommendations  3 in 1 bedside commode    Recommendations for Other Services Rehab consult     Precautions / Restrictions Precautions Precautions: Fall Precaution Comments: Lt hemiparesis  Restrictions Weight Bearing Restrictions: No      Mobility Bed Mobility Overal bed mobility: Needs Assistance Bed Mobility: Supine to Sit;Sit to Supine     Supine to sit: Min assist Sit to supine: Min assist   General bed mobility comments: assist to to guide Lt LE off the bed and to lift trunk.  She needed assist to position hips once back in bed.  She was able to lift bil. LEs onto bed   Transfers Overall transfer level: Needs assistance   Transfers: Sit to/from Stand;Stand Pivot Transfers Sit to Stand: Mod assist         General transfer comment:  assist to power up and mod A to side step up EOB     Balance Overall balance assessment: Needs assistance Sitting-balance support: Single extremity supported Sitting balance-Leahy Scale: Fair     Standing balance support: Bilateral upper extremity supported Standing balance-Leahy Scale: Poor Standing balance comment: requires min - mod A to maintain standing                           ADL either performed or assessed with clinical judgement   ADL Overall ADL's : Needs assistance/impaired Eating/Feeding: Set up;Bed level   Grooming: Wash/dry hands;Wash/dry face;Oral care;Brushing hair;Minimal assistance;Sitting   Upper Body Bathing: Moderate assistance;Sitting   Lower Body Bathing: Maximal assistance;Sit to/from stand   Upper Body Dressing : Maximal assistance;Sitting   Lower Body Dressing: Total assistance;Sit to/from stand   Toilet Transfer: Moderate assistance;Stand-pivot;BSC   Toileting- Clothing Manipulation and Hygiene: Maximal assistance;Sit to/from stand       Functional mobility during ADLs: Moderate assistance       Vision Patient Visual Report: No change from baseline Vision Assessment?: Yes Eye Alignment: Within Functional Limits Ocular Range of Motion: Within Functional Limits Alignment/Gaze Preference: Within Defined Limits Tracking/Visual Pursuits: Able to track stimulus in all quads without difficulty Visual Fields: Left homonymous hemianopsia Additional Comments: Pt reports she needs glasses for distant vision, but does not currently have any      Perception Perception Perception Tested?: Yes Perception Deficits: Inattention/neglect Inattention/Neglect: Does not attend to left side of body;Does not attend  to right visual field Spatial deficits: appears to have mild Lt inattention    Praxis Praxis Praxis tested?: Within functional limits    Pertinent Vitals/Pain Pain Assessment: Faces Faces Pain Scale: Hurts little more Pain  Location: head/neck  Pain Descriptors / Indicators: Aching;Grimacing Pain Intervention(s): Monitored during session     Hand Dominance Right   Extremity/Trunk Assessment Upper Extremity Assessment Upper Extremity Assessment: LUE deficits/detail LUE Deficits / Details: Very minimal activation of Lt shoulder noted, and finger and wrist movement noted with facilitation of reach  LUE Coordination: decreased fine motor;decreased gross motor   Lower Extremity Assessment Lower Extremity Assessment: Defer to PT evaluation   Cervical / Trunk Assessment Cervical / Trunk Assessment: Normal   Communication Communication Communication: No difficulties   Cognition Arousal/Alertness: Awake/alert Behavior During Therapy: WFL for tasks assessed/performed Overall Cognitive Status: Within Functional Limits for tasks assessed                                 General Comments: Pt will benefit from further assessment    General Comments       Exercises     Shoulder Instructions      Home Living Family/patient expects to be discharged to:: Private residence Living Arrangements: Spouse/significant other Available Help at Discharge: Family;Available 24 hours/day Type of Home: House Home Access: Stairs to enter [employer] of Steps: 3 back, 5 front Entrance Stairs-Rails: None Home Layout: One level     Bathroom Shower/Tub: Teacher, early years/pre: Standard     Home Equipment: Cane - single point;Walker - 2 wheels   Additional Comments: pt lives with husband who is on disability from COPD      Prior Functioning/Environment Level of Independence: Independent        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        OT Problem List: Decreased strength;Decreased range of motion;Decreased activity tolerance;Impaired balance (sitting and/or standing);Impaired vision/perception;Decreased coordination;Decreased cognition;Decreased safety  awareness;Decreased knowledge of use of DME or AE;Impaired UE functional use;Obesity      OT Treatment/Interventions: Self-care/ADL training;Neuromuscular education;DME and/or AE instruction;Therapeutic activities;Cognitive remediation/compensation;Visual/perceptual remediation/compensation;Patient/family education;Balance training    OT Goals(Current goals can be found in the care plan section) Acute Rehab OT Goals Patient Stated Goal: to get control of Lt UE  OT Goal Formulation: With patient Time For Goal Achievement: 08/30/19 Potential to Achieve Goals: Good ADL Goals Pt Will Perform Eating: with modified independence;sitting Pt Will Perform Grooming: with min assist;standing Pt Will Perform Upper Body Bathing: with min assist;sitting Pt Will Perform Lower Body Bathing: with mod assist;sit to/from stand Pt Will Perform Upper Body Dressing: with min assist;sitting Pt Will Perform Lower Body Dressing: with mod assist;sit to/from stand Pt Will Transfer to Toilet: with min assist;ambulating;regular height toilet;bedside commode;grab bars Pt Will Perform Toileting - Clothing Manipulation and hygiene: with min assist;sit to/from stand Additional ADL Goal #1: Pt will use Lt UE as a stabilizer during ADLs  OT Frequency: Min 2X/week   Barriers to D/C:            Co-evaluation              AM-PAC OT "6 Clicks" Daily Activity     Outcome Measure Help from another person eating meals?: A Little Help from another person taking care of personal grooming?: A Little Help from another person toileting, which includes using toliet, bedpan, or urinal?: A Lot Help from another person  bathing (including washing, rinsing, drying)?: A Lot Help from another person to put on and taking off regular upper body clothing?: A Lot Help from another person to put on and taking off regular lower body clothing?: Total 6 Click Score: 13   End of Session Equipment Utilized During Treatment: Gait  belt Nurse Communication: Mobility status  Activity Tolerance: Patient tolerated treatment well Patient left: in bed;with call bell/phone within reach;with bed alarm set;with family/visitor present  OT Visit Diagnosis: Unsteadiness on feet (R26.81);Hemiplegia and hemiparesis Hemiplegia - Right/Left: Left Hemiplegia - dominant/non-dominant: Non-Dominant Hemiplegia - caused by: Cerebral infarction                Time: 5859-2924 OT Time Calculation (min): 19 min Charges:  OT General Charges $OT Visit: 1 Visit OT Evaluation $OT Eval Moderate Complexity: 1 Mod  Jeani Hawking, OTR/L Acute Rehabilitation Services Pager [phone] Office [phone]   Jeani Hawking M 08/16/2019, 6:31 PM

## 2019-08-16 NOTE — Evaluation (Signed)
Physical Therapy Evaluation Patient Details Name: Terri Wood MRN: [id number] DOB: 1966/05/13 Today's Date: 08/16/2019   History of Present Illness  Aerie Donica is a 53 y.o. female with history of hypertension, fibromyalgia, anxiety, tobacco abuse presents to the ER after patient was noticed to have left upper and lower extremity weakness. 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   Clinical Impression  Pt admitted with above diagnosis. Pt presents with strong R gaze preference/  L neglect. Required mod A for bed mobility and mod A +2 for safe transfers and for gait. Decreased strength and coordination LLE with knee instability and L foot inversion in standing. Would benefit from intense rehab environment before returning home.  Pt currently with functional limitations due to the deficits listed below (see PT Problem List). Pt will benefit from skilled PT to increase their independence and safety with mobility to allow discharge to the venue listed below.       Follow Up Recommendations CIR;Supervision/Assistance - 24 hour    Equipment Recommendations  Other (comment)(TBD)    Recommendations for Other Services Rehab consult     Precautions / Restrictions Precautions Precautions: Fall Restrictions Weight Bearing Restrictions: No      Mobility  Bed Mobility Overal bed mobility: Needs Assistance Bed Mobility: Supine to Sit;Sit to Supine     Supine to sit: Mod assist Sit to supine: Mod assist   General bed mobility comments: mod A for mgmt of LUE and LLE, mod A for elevation of trunk into sitting and scooting L hip to EOB. Mod A to LE's for return to supine. Pt able to perform partial bridge to scoot over in bed with instructional cues  Transfers Overall transfer level: Needs assistance   Transfers: Sit to/from Stand;Stand Pivot Transfers Sit to Stand: Mod assist Stand pivot transfers: Mod assist;+2 physical assistance       General transfer comment:  mod A to L side for sit<>stand. Able to move to R with mod A +1 but loses balance when moving to L and unable to support self on that side, needs +2 to L  Ambulation/Gait             General Gait Details: will need +2 for ambulation. Took small steps to R from [facility] to bed with +2 mod A  Stairs            Wheelchair Mobility    Modified Rankin (Stroke Patients Only) Modified Rankin (Stroke Patients Only) Pre-Morbid Rankin Score: No symptoms Modified Rankin: Severe disability     Balance Overall balance assessment: Needs assistance Sitting-balance support: Single extremity supported Sitting balance-Leahy Scale: Fair     Standing balance support: Bilateral upper extremity supported Standing balance-Leahy Scale: Poor Standing balance comment: requires mod A to maintain standing                             Pertinent Vitals/Pain Pain Assessment: No/denies pain    Home Living Family/patient expects to be discharged to:: Private residence Living Arrangements: Spouse/significant other Available Help at Discharge: Family;Available 24 hours/day Type of Home: House Home Access: Stairs to enter Entrance Stairs-Rails: None Entrance Stairs-Number of Steps: 3 back, 5 front Home Layout: One level Home Equipment: Cane - single point;Walker - 2 wheels Additional Comments: pt lives with husband who is on disability from COPD    Prior Function Level of Independence: Independent         Comments: works full  time cleaning homes     Hand Dominance   Dominant Hand: Right    Extremity/Trunk Assessment   Upper Extremity Assessment Upper Extremity Assessment: Defer to OT evaluation;LUE deficits/detail LUE Deficits / Details: no active mvmt of LUE noted on PT eval LUE Sensation: decreased proprioception LUE Coordination: decreased fine motor;decreased gross motor    Lower Extremity Assessment Lower Extremity Assessment: LLE deficits/detail LLE Deficits /  Details: hip flex 3-/5 but noted ankle inversion with effort of hip flex, knee instability noted in standing, ankle 3/5 LLE Sensation: decreased proprioception LLE Coordination: decreased fine motor;decreased gross motor    Cervical / Trunk Assessment Cervical / Trunk Assessment: Normal  Communication   Communication: No difficulties  Cognition Arousal/Alertness: Awake/alert Behavior During Therapy: WFL for tasks assessed/performed Overall Cognitive Status: Impaired/Different from baseline Area of Impairment: Following commands;Attention;Problem solving;Safety/judgement                   Current Attention Level: Selective   Following Commands: Follows one step commands consistently;Follows one step commands with increased time;Follows multi-step commands with increased time Safety/Judgement: Decreased awareness of deficits   Problem Solving: Slow processing;Difficulty sequencing;Requires verbal cues;Requires tactile cues General Comments: L inattention, decreased awareness of deficits      General Comments General comments (skin integrity, edema, etc.): strong R gaze preference. When cued to look to L, pt turns head so far L that she can still look R. Decreased attention to LUE    Exercises General Exercises - Lower Extremity Ankle Circles/Pumps: AROM;Both;10 reps;Supine Heel Slides: AROM;Left;5 reps;Supine Straight Leg Raises: AROM;Left;5 reps;Supine   Assessment/Plan    PT Assessment Patient needs continued PT services  PT Problem List Decreased strength;Decreased activity tolerance;Decreased range of motion;Decreased balance;Decreased mobility;Decreased coordination;Decreased cognition;Decreased knowledge of use of DME;Decreased safety awareness;Decreased knowledge of precautions;Impaired sensation;Impaired tone;Obesity       PT Treatment Interventions DME instruction;Gait training;Stair training;Functional mobility training;Therapeutic activities;Therapeutic  exercise;Balance training;Neuromuscular re-education;Cognitive remediation;Patient/family education    PT Goals (Current goals can be found in the Care Plan section)  Acute Rehab PT Goals Patient Stated Goal: be mobile PT Goal Formulation: With patient Time For Goal Achievement: 08/30/19 Potential to Achieve Goals: Good    Frequency Min 4X/week   Barriers to discharge        Co-evaluation               AM-PAC PT "6 Clicks" Mobility  Outcome Measure Help needed turning from your back to your side while in a flat bed without using bedrails?: A Lot Help needed moving from lying on your back to sitting on the side of a flat bed without using bedrails?: A Lot Help needed moving to and from a bed to a chair (including a wheelchair)?: A Lot Help needed standing up from a chair using your arms (e.g., wheelchair or bedside chair)?: A Lot Help needed to walk in hospital room?: Total Help needed climbing 3-5 steps with a railing? : Total 6 Click Score: 10    End of Session Equipment Utilized During Treatment: Gait belt Activity Tolerance: Patient tolerated treatment well Patient left: in bed;with call bell/phone within reach;with bed alarm set Nurse Communication: Mobility status PT Visit Diagnosis: Unsteadiness on feet (R26.81);Hemiplegia and hemiparesis;Difficulty in walking, not elsewhere classified (R26.2) Hemiplegia - Right/Left: Left Hemiplegia - dominant/non-dominant: Non-dominant Hemiplegia - caused by: Cerebral infarction    Time: 9326-7124 PT Time Calculation (min) (ACUTE ONLY): 41 min   Charges:   PT Evaluation $PT Eval Moderate Complexity: 1 Mod PT Treatments $  Therapeutic Activity: 23-37 mins        Lyanne CoVictoria , PT  Acute Rehab Services  Pager [phone] Office [phone]   Lawana ChambersVictoria L  08/16/2019, 12:24 PM

## 2019-08-16 NOTE — Progress Notes (Signed)
[facility] admission for CVA. See H&P for details. Says left side still weak this AM. PT/OT/SLP pending. A1c is 6.1. Counseled about pre-diabetes and lifestyle changes (including tobacco cessation) this AM. Awaiting echo. Continue as per H&P.   Acute CVA HTN Pre-diabetes Depression/anxiety Tobacco abuse Morbid obesity Fibromyalgia  General: 53 y.o. female resting in bed in NAD Eyes: PERRL, normal sclera ENMT: Nares patent w/o discharge, orophaynx clear, dentition normal, ears w/o discharge/lesions/ulcers Cardiovascular: RRR, +S1, S2, no m/g/r, equal pulses throughout Respiratory: CTABL, no w/r/r, normal WOB GI: BS+, NDNT, no masses noted, no organomegaly noted MSK: No e/c/c Skin: No rashes, bruises, ulcerations noted Neuro: A&O x 3, left facial droop; LUE/LLE weakness Psyc: Appropriate interaction and affect, calm/cooperative  .Jonnie Finner, DO
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
## 2019-08-16 NOTE — H&P (Signed)
History and Physical    Terri Wood ZOX:[id number] DOB: 11/15/1965 DOA: 08/15/2019  PCP: Leotis Shames, MD  Patient coming from: Home.  Chief Complaint: Left upper extremity and lower extremity weakness.  HPI: Terri Wood is a 53 y.o. female with history of hypertension, fibromyalgia, anxiety, tobacco abuse presents to the ER after patient was noticed to have left upper and lower extremity weakness.  Patient states her weakness started around 2 PM yesterday.  Which happened suddenly.  At around 7 PM when she was trying to walk she fell and EMS was called.  EMS noticed the weakness and was brought to the ER.  Denies any difficulty speaking swallowing.  Patient was recently placed on antibiotics and prednisone for sinusitis.  ED Course: In the ER patient had a CT head which showed right frontal infarct and also old infarct in the parietal area.  CT angiogram of the head and neck shows occluded right carotid artery.  EKG shows normal sinus rhythm.  COVID-19 test was negative.  Labs show hemoglobin of 12 WBC 11 platelets 227 creatinine 1.9.  Review of Systems: As per HPI, rest all negative.   Past Medical History:  Diagnosis Date   Anxiety    Fibromyalgia    Hypertension    Osteoarthritis of left hip 2019    Past Surgical History:  Procedure Laterality Date   ABDOMINAL SURGERY  1996   gastric bypass; stapling; surgilite   PLANTAR FASCIA SURGERY Right 2009   TOTAL HIP ARTHROPLASTY Left 04/09/2018   Procedure: TOTAL HIP ARTHROPLASTY ANTERIOR APPROACH;  Surgeon: Kennedy Bucker, MD;  Location: [facility] ORS;  Service: Orthopedics;  Laterality: Left;     reports that she has been smoking cigarettes. She has been smoking about 1.00 pack per day. She has never used smokeless tobacco. She reports current alcohol use. She reports current drug use. Drug: Marijuana.  Allergies  Allergen Reactions   Penicillins Hives    Did it involve swelling of the face/tongue/throat, SOB, or low BP?  Yes Did it involve sudden or severe rash/hives, skin peeling, or any reaction on the inside of your mouth or nose? No Did you need to seek medical attention at a hospital or doctor's office? No When did it last happen? Within the past 10 years If all above answers are NO, may proceed with cephalosporin use.     Family History  Problem Relation Age of Onset   Aneurysm Mother    Heart disease Father     Prior to Admission medications   Medication Sig Start Date End Date Taking? Authorizing Provider  amLODipine (NORVASC) 10 MG tablet Take 10 mg by mouth daily. 11/13/17  Yes [provider]  Apple Cid Vn-Grn Tea-Bit Or-Cr (APPLE CIDER VINEGAR PLUS) TABS Take 1 tablet by mouth 2 (two) times daily.   Yes [provider]  baclofen (LIORESAL) 10 MG tablet Take 10 mg by mouth 2 (two) times daily as needed for muscle spasms.   Yes [provider]  buPROPion (WELLBUTRIN XL) 150 MG 24 hr tablet Take 150 mg by mouth daily.   Yes [provider]  Calcium Carbonate-Vit D-Min (CALTRATE 600+D PLUS PO) Take 1 tablet by mouth daily.   Yes [provider]  clonazePAM (KLONOPIN) 0.5 MG tablet Take 0.25-0.5 tablets by mouth 3 (three) times daily as needed for anxiety (or sleep).  12/11/14  Yes [provider]  diclofenac (VOLTAREN) 75 MG EC tablet Take 75 mg by mouth 2 (two) times daily.   Yes  [provider]  DULoxetine (CYMBALTA) 60 MG capsule Take 60 mg by mouth daily. 03/13/18  Yes [provider]  escitalopram (LEXAPRO) 10 MG tablet Take 10 mg by mouth daily.   Yes [provider]  fluticasone (FLONASE) 50 MCG/ACT nasal spray Place 1 spray into both nostrils 2 (two) times daily as needed for allergies or rhinitis.  07/11/19  Yes [provider]  levofloxacin (LEVAQUIN) 250 MG tablet Take 250 mg by mouth 2 (two) times daily. FOR 5 DAYS 08/13/19  Yes [provider]  lisinopril-hydrochlorothiazide (ZESTORETIC)  20-25 MG tablet Take 1 tablet by mouth daily.   Yes [provider]  Multiple Vitamins-Minerals (MULTI FOR HER 50+ PO) Take 1 tablet by mouth daily.   Yes [provider]  Omega 3 1000 MG CAPS Take 1,000 mg by mouth daily with breakfast.    Yes [provider]  predniSONE (DELTASONE) 20 MG tablet Take 40 mg by mouth every morning. FOR 5 DAYS 08/13/19  Yes [provider]  pregabalin (LYRICA) 150 MG capsule Take 150 mg by mouth 2 (two) times daily. 08/13/19  Yes [provider]  tiZANidine (ZANAFLEX) 4 MG tablet Take 2-4 mg by mouth See admin instructions. Take 2 mg by mouth at bedtime and an additional 2-4 mg once a day as needed for spasms 01/27/18  Yes [provider]  traMADol (ULTRAM) 50 MG tablet Take 50 mg by mouth 2 (two) times daily. 07/14/19  Yes [provider]  enoxaparin (LOVENOX) 40 MG/0.4ML injection Inject 0.4 mLs (40 mg total) into the skin daily for 14 days. Patient not taking: Reported on 08/15/2019 04/12/18 08/15/19  Evon Slack, PA-C  HYDROcodone-acetaminophen (NORCO/VICODIN) 5-325 MG tablet Take 1-2 tablets by mouth every 4 (four) hours as needed for moderate pain (pain score 4-6). Patient not taking: Reported on 08/15/2019 04/11/18   Evon Slack, PA-C    Physical Exam: Constitutional: Moderately built and nourished. Vitals:   08/16/19 0200 08/16/19 0215 08/16/19 0300 08/16/19 0334  BP: (!) 161/93 (!) 142/82 140/81 128/67  Pulse: 81 86 82 79  Resp: Temp:    98.6 F (37 C)  TempSrc:    Oral  SpO2: 98% 96% 98% 97%  Weight:      Height:       Eyes: Anicteric no pallor. ENMT: No discharge from the ears eyes nose or mouth. Neck: No mass felt.  No neck rigidity. Respiratory: No rhonchi or crepitations. Cardiovascular: S1-S2 heard. Abdomen: Soft nontender bowel sounds present. Musculoskeletal: No edema. Skin: No rash. Neurologic: Alert awake oriented to time place and person.  Has almost  0 x 5 strength in the left upper extremity and 4 x 5 strength in the left lower extremity.  Right upper and lower extremity is 5 x 5.  No facial asymmetry.  Hemianopsia in the left side. Psychiatric: Appears normal.   Labs on Admission: I have personally reviewed following labs and imaging studies  CBC: Recent Labs  Lab 08/15/19 2112 08/15/19 2255  WBC 11.0*  --   NEUTROABS 7.8*  --   HGB 12.1 12.9  HCT 38.5 38.0  MCV 86.9  --   PLT 227  --    Basic Metabolic Panel: Recent Labs  Lab 08/15/19 2255 08/15/19 2320  NA 137 139  K 5.0 4.0  CL 103 103  CO2  --  25  GLUCOSE 70 92  BUN 27* 16  CREATININE 0.90 0.87  CALCIUM  --  9.1   GFR: Estimated Creatinine Clearance: 84.4 mL/min (by C-G formula based on SCr of 0.87 mg/dL). Liver Function Tests: Recent Labs  Lab 08/15/19 2320  AST 20  ALT 23  ALKPHOS 52  BILITOT 0.4  PROT 6.2*  ALBUMIN 3.4*   No results for input(s): LIPASE, AMYLASE in the last 168 hours. No results for input(s): AMMONIA in the last 168 hours. Coagulation Profile: Recent Labs  Lab 08/15/19 2320  INR 1.0   Cardiac Enzymes: No results for input(s): CKTOTAL, CKMB, CKMBINDEX, TROPONINI in the last 168 hours. BNP (last 3 results) No results for input(s): PROBNP in the last 8760 hours. HbA1C: No results for input(s): HGBA1C in the last 72 hours. CBG: Recent Labs  Lab 08/15/19 2311  GLUCAP 84   Lipid Profile: No results for input(s): CHOL, HDL, LDLCALC, TRIG, CHOLHDL, LDLDIRECT in the last 72 hours. Thyroid Function Tests: No results for input(s): TSH, T4TOTAL, FREET4, T3FREE, THYROIDAB in the last 72 hours. Anemia Panel: No results for input(s): VITAMINB12, FOLATE, FERRITIN, TIBC, IRON, RETICCTPCT in the last 72 hours. Urine analysis:    Component Value Date/Time   COLORURINE YELLOW (A) 04/03/2018 1435   APPEARANCEUR CLEAR (A) 04/03/2018 1435   LABSPEC 1.012 04/03/2018 1435   PHURINE 6.0 04/03/2018 1435   GLUCOSEU NEGATIVE 04/03/2018  1435   HGBUR MODERATE (A) 04/03/2018 1435   BILIRUBINUR NEGATIVE 04/03/2018 1435   KETONESUR NEGATIVE 04/03/2018 1435   PROTEINUR NEGATIVE 04/03/2018 1435   NITRITE NEGATIVE 04/03/2018 1435   LEUKOCYTESUR NEGATIVE 04/03/2018 1435   Sepsis Labs: (procalcitonin:4,lacticidven:4) )No results found for this or any previous visit (from the past 240 hour(s)).   Radiological Exams on Admission: Ct Angio Head W Or Wo Contrast  Result Date: 08/15/2019 CLINICAL DATA:  Code stroke. Neuro deficit for greater than 6 hours. Left-sided weakness. Last seen normal 7.5 hours ago. EXAM: CT ANGIOGRAPHY HEAD AND NECK TECHNIQUE: Multidetector CT imaging of the head and neck was performed using the standard protocol during bolus administration of intravenous contrast. Multiplanar CT image reconstructions and MIPs were obtained to evaluate the vascular anatomy. Carotid stenosis measurements (when applicable) are obtained utilizing NASCET criteria, using the distal internal carotid diameter as the denominator. CONTRAST:  OMNIPAQUE IOHEXOL 350 MG/ML SOLN COMPARISON:  CT head without contrast 08/15/2019 FINDINGS: CTA NECK FINDINGS Aortic arch: A 3 vessel arch configuration is present. Minimal distal atherosclerotic changes are present. There is no significant stenosis or aneurysm. Right carotid system: The right common carotid artery is within normal limits. The right internal carotid artery is occluded at the bifurcation. There is no reconstitution in the neck. Left carotid system: The left common carotid artery is within normal limits. Mild atherosclerotic changes are noted at the bifurcation. There is some tortuosity of the cervical left ICA without a significant stenosis. Vertebral arteries: The left vertebral artery is slightly dominant to the right. Both vertebral arteries originate from the subclavian arteries without significant stenosis. There is no significant stenosis of either vertebral artery in the  neck. 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 Vertebral body heights and alignment are maintained. No focal lytic or blastic lesions are present. Other neck: The soft tissues the neck are unremarkable. No focal mucosal or submucosal lesions are present. Salivary glands are within normal limits. No significant adenopathy is present. Thyroid is normal. Upper chest: Lung apices demonstrate mild diffuse ground-glass attenuation. No focal nodule, mass, or airspace disease present. The thoracic inlet is normal. Review of the MIP images confirms the above findings  CTA HEAD FINDINGS Anterior circulation: The right internal carotid artery is not reconstituted significantly below the ICA termini. There is minimal filling of the supraclinoid right ICA. The anterior communicating artery is patent. Study is mildly degraded by patient motion and skull base. The left internal carotid artery is unremarkable through the ICA terminus. The A1 and M1 segments are normal. Motion artifact obscures portions of the left M1 segment. Bifurcations are normal. There is attenuation of distal branch vessels on the right consistent with the infarct territory. No significant proximal anterior occlusion is present. There is some attenuation of distal ACA branches without a significant proximal stenosis or occlusion. Posterior circulation: The left vertebral artery is slightly dominant. Vertebrobasilar junction is normal. Basilar artery is normal. Both posterior cerebral arteries originate from the basilar tip. The PCA branch vessels are within normal limits. Venous sinuses: The dural sinuses are patent. Anatomic variants: None Review of the MIP images confirms the above findings IMPRESSION: 1. 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 2. Marked attenuation of distal posterior right MCA branches compatible with the more remote infarct. 3. No significant  anterior stenosis or occlusion. 4. Left MCA branches are normal. 5. Posterior circulation is unremarkable. Electronically Signed   By: Marin Robertshristopher  Mattern M.D.   On: 08/15/2019 21:43   Ct Angio Neck W Or Wo Contrast  Result Date: 08/15/2019 CLINICAL DATA:  Code stroke. Neuro deficit for greater than 6 hours. Left-sided weakness. Last seen normal 7.5 hours ago. EXAM: CT ANGIOGRAPHY HEAD AND NECK TECHNIQUE: Multidetector CT imaging of the head and neck was performed using the standard protocol during bolus administration of intravenous contrast. Multiplanar CT image reconstructions and MIPs were obtained to evaluate the vascular anatomy. Carotid stenosis measurements (when applicable) are obtained utilizing NASCET criteria, using the distal internal carotid diameter as the denominator. CONTRAST:  100mL OMNIPAQUE IOHEXOL 350 MG/ML SOLN COMPARISON:  CT head without contrast 08/15/2019 FINDINGS: CTA NECK FINDINGS Aortic arch: A 3 vessel arch configuration is present. Minimal distal atherosclerotic changes are present. There is no significant stenosis or aneurysm. Right carotid system: The right common carotid artery is within normal limits. The right internal carotid artery is occluded at the bifurcation. There is no reconstitution in the neck. Left carotid system: The left common carotid artery is within normal limits. Mild atherosclerotic changes are noted at the bifurcation. There is some tortuosity of the cervical left ICA without a significant stenosis. Vertebral arteries: The left vertebral artery is slightly dominant to the right. Both vertebral arteries originate from the subclavian arteries without significant stenosis. There is no significant stenosis of either vertebral artery in the neck. 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 Vertebral body heights and alignment are maintained. No focal lytic or blastic lesions are present. Other neck: The soft tissues the neck are unremarkable. No focal mucosal  or submucosal lesions are present. Salivary glands are within normal limits. No significant adenopathy is present. Thyroid is normal. Upper chest: Lung apices demonstrate mild diffuse ground-glass attenuation. No focal nodule, mass, or airspace disease present. The thoracic inlet is normal. Review of the MIP images confirms the above findings CTA HEAD FINDINGS Anterior circulation: The right internal carotid artery is not reconstituted significantly below the ICA termini. There is minimal filling of the supraclinoid right ICA. The anterior communicating artery is patent. Study is mildly degraded by patient motion and skull base. The left internal carotid artery is unremarkable through the ICA  terminus. The A1 and M1 segments are normal. Motion artifact obscures portions of the left M1 segment. Bifurcations are normal. There is attenuation of distal branch vessels on the right consistent with the infarct territory. No significant proximal anterior occlusion is present. There is some attenuation of distal ACA branches without a significant proximal stenosis or occlusion. Posterior circulation: The left vertebral artery is slightly dominant. Vertebrobasilar junction is normal. Basilar artery is normal. Both posterior cerebral arteries originate from the basilar tip. The PCA branch vessels are within normal limits. Venous sinuses: The dural sinuses are patent. Anatomic variants: None Review of the MIP images confirms the above findings IMPRESSION: 1. 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 2. Marked attenuation of distal posterior right MCA branches compatible with the more remote infarct. 3. No significant anterior stenosis or occlusion. 4. Left MCA branches are normal. 5. Posterior circulation is unremarkable. Electronically Signed   By: San Morelle M.D.   On: 08/15/2019 21:43   Ct Head Code Stroke Wo Contrast`  Result  Date: 08/15/2019 CLINICAL DATA:  Code stroke. Neuro deficit for less than 6 hours. Left-sided weakness. EXAM: CT HEAD WITHOUT CONTRAST TECHNIQUE: Contiguous axial images were obtained from the base of the skull through the vertex without intravenous contrast. COMPARISON:  CT head without contrast 04/28/2015 FINDINGS: Brain: The posterior right MCA territory infarct is new since the prior study, but not acute. There is volume loss with ex vacuo dilation of the right lateral ventricle. There is subtle changes in the super ganglionic right precentral gyrus. Basal ganglia are intact. Insular ribbon is normal. No significant extra-axial fluid collection is present. There is loss of gray-white differentiation more anteriorly in the high right frontal lobe as well. Left hemisphere is unremarkable. Insert normal brainstem Vascular: No hyperdense vessel or unexpected calcification. Skull: Calvarium is intact. No focal lytic or blastic lesions are present. Sinuses/Orbits: The globes and orbits are within normal limits. The paranasal sinuses and mastoid air cells are clear. ASPECTS [facility] Stroke Program Early CT Score) - Ganglionic level infarction (caudate, lentiform nuclei, internal capsule, insula, M1-M3 cortex): 7/7 - Supraganglionic infarction (M4-M6 cortex): 1/3 Total score (0-10 with 10 being normal): 8/10 IMPRESSION: 1. Areas of acute/subacute nonhemorrhagic infarct in the high right frontal lobe are new since the prior study. 2. New but remote posterior right MCA territory infarcts. 3. No acute vascular lesion. 4. ASPECTS is 8/10 The above was relayed via text pager to Dr. Samara Snide on 08/15/2019 at 20:44 . Electronically Signed   By: San Morelle M.D.   On: 08/15/2019 20:45    EKG: Independently reviewed.  Normal sinus rhythm.  Assessment/Plan Principal Problem:   Acute CVA (cerebrovascular accident) [facility]) Active Problems:   Essential hypertension   Fibromyalgia    1. Acute CVA -check 2D  echo MRI brain.  Patient passed swallow pill on aspirin Plavix statins.  Neurochecks.  Has right carotid obstruction for which neurology is planning to have interventional radiology consult.  Check hemoglobin A1c lipid panel.  Patient passed swallow. 2. Hypertension -we will hold antihypertensive and allow for permissive hypertension.  As needed IV hydralazine for systolic blood pressure more than 081 and diastolic more than 448. 3. Fibromyalgia on baclofen Lyrica and tizanidine. 4. Depression anxiety on Cymbalta Lexapro and Klonopin. 5. Tobacco abuse advised about quitting. 6. Patient states he drinks at least 1 or 2 cans of beer every day.  Closely monitor for any withdrawal.  Given that patient will need close monitoring for any further deterioration and further work-up and will need more than 2 midnight stay will need inpatient status.   DVT prophylaxis: Lovenox. Code Status: Full code. Family Communication: Discussed with patient. Disposition Plan: Home. Consults called: Neurology. Admission status: Inpatient.   Eduard Clos MD [facility] Pager [phone].  If 7PM-7AM, please contact night-coverage [email or web address] Password [email or web address]  08/16/2019, 3:43 AM

## 2019-08-16 NOTE — Plan of Care (Signed)

## 2019-08-16 NOTE — Progress Notes (Signed)
Rehab Admissions Coordinator Note:  Patient was screened by Michel Santee for appropriateness for an Inpatient Acute Rehab Consult.  At this time, we are recommending Inpatient Rehab consult.  Michel Santee 08/16/2019, 9:10 PM  I can be reached at [phone].

## 2019-08-16 NOTE — Progress Notes (Signed)
Terri Wood [id number] Admission Data: 08/16/2019 3:47 AM Attending Provider: Rise Patience, MD  [id number], Delana Meyer, MD Consults/ Treatment Team:   Terri Wood is a 53 y.o. female patient admitted from ED awake, alert  & orientated  X 3,  Full Code, VSS - Blood pressure 128/67, pulse 79, temperature 98.6 F (37 C), temperature source Oral, resp. rate 20, height 5' 0.5" (1.537 m), weight 106.6 kg, SpO2 97 %., O2   Room air, no c/o shortness of breath, no c/o chest pain, no distress noted. Tele # [id number] placed and pt is currently running:normal sinus rhythm.   IV site WDL:  antecubital right, condition patent and no redness with a transparent dsg that's clean dry and intact.  Allergies:   Allergies  Allergen Reactions  . Penicillins Hives    Did it involve swelling of the face/tongue/throat, SOB, or low BP? Yes Did it involve sudden or severe rash/hives, skin peeling, or any reaction on the inside of your mouth or nose? No Did you need to seek medical attention at a hospital or doctor's office? No When did it last happen? Within the past 10 years If all above answers are "NO", may proceed with cephalosporin use.      Past Medical History:  Diagnosis Date  . Anxiety   . Fibromyalgia   . Hypertension   . Osteoarthritis of left hip 2019    Pt orientation to unit, room and routine. Information packet given to patient/family and safety video watched.  Admission INP armband ID verified with patient/family, and in place. SR up x 2, fall risk assessment complete with Patient and family verbalizing understanding of risks associated with falls. Pt verbalizes an understanding of how to use the call bell and to call for help before getting out of bed.  Skin, clean-dry- intact without evidence of bruising, or skin tears.   No evidence of skin break down noted on exam. no rashes, no ecchymoses, no petechiae, no wounds    Will cont to monitor and assist as needed.  Walker Shadow,  RN 08/16/2019 3:47 AM

## 2019-08-17 LAB — RENAL FUNCTION PANEL
Albumin: 3.4 g/dL — ABNORMAL LOW (ref 3.5–5.0)
Anion gap: 8 (ref 5–15)
BUN: 17 mg/dL (ref 6–20)
CO2: 25 mmol/L (ref 22–32)
Calcium: 9 mg/dL (ref 8.9–10.3)
Chloride: 105 mmol/L (ref 98–111)
Creatinine, Ser: 0.79 mg/dL (ref 0.44–1.00)
GFR calc Af Amer: >60 mL/min (ref >60)
GFR calc non Af Amer: >60 mL/min (ref >60)
Glucose, Bld: 101 mg/dL — ABNORMAL HIGH (ref 70–99)
Phosphorus: 3.6 mg/dL (ref 2.5–4.6)
Potassium: 4.1 mmol/L (ref 3.5–5.1)
Sodium: 138 mmol/L (ref 135–145)

## 2019-08-17 LAB — CBC WITH DIFFERENTIAL/PLATELET
Abs Immature Granulocytes: 0.02 10*3/uL (ref 0.00–0.07)
Basophils Absolute: 0.1 10*3/uL (ref 0.0–0.1)
Basophils Relative: 1 %
Eosinophils Absolute: 0.1 10*3/uL (ref 0.0–0.5)
Eosinophils Relative: 1 %
HCT: 37.6 % (ref 36.0–46.0)
Hemoglobin: 11.9 g/dL — ABNORMAL LOW (ref 12.0–15.0)
Immature Granulocytes: 0 %
Lymphocytes Relative: 34 %
Lymphs Abs: 3.2 10*3/uL (ref 0.7–4.0)
MCH: 27.1 pg (ref 26.0–34.0)
MCHC: 31.6 g/dL (ref 30.0–36.0)
MCV: 85.6 fL (ref 80.0–100.0)
Monocytes Absolute: 0.9 10*3/uL (ref 0.1–1.0)
Monocytes Relative: 10 %
Neutro Abs: 5.1 10*3/uL (ref 1.7–7.7)
Neutrophils Relative %: 54 %
Platelets: 314 10*3/uL (ref 150–400)
RBC: 4.39 MIL/uL (ref 3.87–5.11)
RDW: 17.7 % — ABNORMAL HIGH (ref 11.5–15.5)
WBC: 9.4 10*3/uL (ref 4.0–10.5)
nRBC: 0 % (ref 0.0–0.2)

## 2019-08-17 LAB — BETA-2-GLYCOPROTEIN I ABS, IGG/M/A
Beta-2 Glyco I IgG: <9 GPI IgG units (ref 0–20)
Beta-2-Glycoprotein I IgA: <9 GPI IgA units (ref 0–25)
Beta-2-Glycoprotein I IgM: 41 GPI IgM units — ABNORMAL HIGH (ref 0–32)

## 2019-08-17 LAB — MAGNESIUM: Magnesium: 2 mg/dL (ref 1.7–2.4)

## 2019-08-17 LAB — HOMOCYSTEINE: Homocysteine: 7.2 umol/L (ref 0.0–14.5)

## 2019-08-17 NOTE — Evaluation (Signed)
Speech Language Pathology Evaluation Patient Details Name: Terri Wood MRN: [id number] DOB: 21-May-1966 Today's Date: 08/17/2019 Time: 2831-5176 SLP Time Calculation (min) (ACUTE ONLY): 18 min  Problem List:  Patient Active Problem List   Diagnosis Date Noted  . Essential hypertension 08/16/2019  . Fibromyalgia 08/16/2019  . Hyperlipidemia   . Smoker   . Acute CVA (cerebrovascular accident) ([place]) 08/15/2019  . Avascular necrosis of left femoral head ([place]) 04/09/2018   Past Medical History:  Past Medical History:  Diagnosis Date  . Anxiety   . Fibromyalgia   . Hypertension   . Osteoarthritis of left hip 2019   Past Surgical History:  Past Surgical History:  Procedure Laterality Date  . ABDOMINAL SURGERY  1996   gastric bypass; stapling; surgilite  . PLANTAR FASCIA SURGERY Right 2009  . TOTAL HIP ARTHROPLASTY Left 04/09/2018   Procedure: TOTAL HIP ARTHROPLASTY ANTERIOR APPROACH;  Surgeon: Hessie Knows, MD;  Location: [facility] ORS;  Service: Orthopedics;  Laterality: Left;   HPI:  53 y.o. female with history of hypertension, fibromyalgia, anxiety, tobacco abuse presents to the ER after patient was noticed to have left upper and lower extremity weakness. 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    Assessment / Plan / Recommendation Clinical Impression  Pt presents with clear, fluent speech; cognitive-communicative assessment reveals intact recall, normal verbal attention, good verbal problem-solving skills and divergent thinking.  Demonstrates good insight.  Left inattention noted during clock-drawing task.  Expressive and receptive language are WNL.  Pt may benefit from more intensive cognitive assessment at CIR level to evaluate reading/writing and further assess executive functioning.  No further acute care SLP needs are identified.      SLP Assessment  SLP Recommendation/Assessment: All further Speech Lanaguage Pathology  needs can be addressed in the next  venue of care SLP Visit Diagnosis: Cognitive communication deficit (R41.841)    Follow Up Recommendations  Inpatient Rehab    Frequency and Duration           SLP Evaluation Cognition  Overall Cognitive Status: Within Functional Limits for tasks assessed Arousal/Alertness: Awake/alert Orientation Level: Oriented X4 Attention: Selective Selective Attention: Appears intact Memory: Appears intact Awareness: Appears intact Problem Solving: Appears intact Executive Function: Reasoning Reasoning: Appears intact Safety/Judgment: Appears intact       Comprehension  Auditory Comprehension Overall Auditory Comprehension: Appears within functional limits for tasks assessed Visual Recognition/Discrimination Discrimination: Within Function Limits Reading Comprehension Reading Status: (difficult to assess - glasses not available)    Expression Expression Primary Mode of Expression: Verbal Verbal Expression Overall Verbal Expression: Appears within functional limits for tasks assessed Written Expression Dominant Hand: Right Written Expression: Not tested   Oral / Motor  Oral Motor/Sensory Function Overall Oral Motor/Sensory Function: Within functional limits Motor Speech Overall Motor Speech: Appears within functional limits for tasks assessed   GO                    Juan Quam Laurice 08/17/2019, 11:24 AM   Estill Bamberg L. Tivis Ringer, [employer] Office number [phone] Pager [phone]

## 2019-08-17 NOTE — Plan of Care (Signed)
  Problem: Education: Goal: Knowledge of General Education information will improve Description: Including pain rating scale, medication(s)/side effects and non-pharmacologic comfort measures Outcome: Progressing   Problem: Ischemic Stroke/TIA Tissue Perfusion: Goal: Complications of ischemic stroke/TIA will be minimized Outcome: Progressing   

## 2019-08-17 NOTE — Progress Notes (Signed)
[name] Kitchen  PROGRESS NOTE    Terri Wood  QMV:[id number] DOB: 16-Feb-1966 DOA: 08/15/2019 PCP: Leotis Shames, MD   Brief Narrative:   Terri Wood is a 53 y.o. female with history of hypertension, fibromyalgia, anxiety, tobacco abuse presents to the ER after patient was noticed to have left upper and lower extremity weakness.  Patient states her weakness started around 2 PM yesterday.  Which happened suddenly.  At around 7 PM when she was trying to walk she fell and EMS was called.  EMS noticed the weakness and was brought to the ER.  Denies any difficulty speaking swallowing.  Patient was recently placed on antibiotics and prednisone for sinusitis.  11/1: No acute events ON. She's worried about not being able to move her arm.   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  HTN     - BP acceptable now off meds     - resume meds as tolerated in AM  Pre-diabetes     - counseled on DM, lifestyle changes     - follow up with PCP  Depression/anxiety     - lexapro, wellbutrin  Tobacco abuse     - counseled against further use  Morbid obesity     - diet, exercise counseled  Fibromyalgia     - cymbalta, zanaflex, ultram  Needs inpt rehab.   DVT prophylaxis: lovenox Code Status: FULL   Disposition Plan: TBD  Consultants:   Neurology   ROS:  Denies CP, dyspnea, ab pain, N,V. Reports L arm weakness . Remainder 10-pt ROS is negative for all not previously mentioned.  Subjective: "I just want it to go back to  normal."  Objective: Vitals:   08/17/19 0042 08/17/19 0459 08/17/19 0800 08/17/19 1156  BP: 135/74 (!) 142/73 127/65 129/68  Pulse: 74 78 73 74  Resp: 14 16    Temp: 98.3 F (36.8 C) 98.3 F (36.8 C) 98.1 F (36.7 C) 98.3 F (36.8 C)  TempSrc: Oral Oral Oral Oral  SpO2: 94% 92% 95% 95%  Weight:      Height:        Intake/Output Summary (Last 24 hours) at 08/17/2019 1326 Last data filed at 08/17/2019 1250 Gross per 24 hour  Intake 960 ml  Output 2950 ml  Net -1990 ml   Filed Weights   08/15/19 2108  Weight: 106.6 kg    Examination:  General: 53 y.o. female resting in bed in NAD Cardiovascular: RRR, +S1, S2, no m/g/r, equal pulses throughout Respiratory: CTABL, no w/r/r, normal WOB GI: BS+, NDNT, no masses noted, no organomegaly noted MSK: No e/c/c, LUE/LLE weakness Neuro: A&O x 3, LUE/LLE weakness   Data Reviewed: I have personally reviewed following labs and imaging studies.  CBC: Recent Labs  Lab 08/15/19 2112 08/15/19 2255 08/16/19 0516 08/17/19 0245  WBC 11.0*  --  11.5* 9.4  NEUTROABS 7.8*  --   --  5.1  HGB 12.1 12.9 12.4 11.9*  HCT 38.5 38.0 39.1 37.6  MCV 86.9  --  85.9 85.6  PLT 227  --  369 314   Basic Metabolic Panel: Recent Labs  Lab 08/15/19 2255 08/15/19 2320 08/16/19 0516 08/17/19 0245  NA 137 139  --  138  K 5.0 4.0  --  4.1  CL 103 103  --  105  CO2  --  25  --  25  GLUCOSE 70 92  --  101*  BUN 27* 16  --  17  CREATININE 0.90 0.87 0.89 0.79  CALCIUM  --  9.1  --  9.0  MG  --   --   --  2.0  PHOS  --   --   --  3.6   GFR: Estimated Creatinine Clearance: 91.8 mL/min (by C-G formula based on SCr of 0.79 mg/dL). Liver Function Tests: Recent Labs  Lab 08/15/19 2320 08/17/19 0245  AST 20  --   ALT 23  --   ALKPHOS 52  --   BILITOT 0.4  --   PROT 6.2*  --   ALBUMIN 3.4* 3.4*   No results for input(s): LIPASE, AMYLASE in the last 168 hours. No results for input(s): AMMONIA in the last 168 hours. Coagulation  Profile: Recent Labs  Lab 08/15/19 2320  INR 1.0   Cardiac Enzymes: No results for input(s): CKTOTAL, CKMB, CKMBINDEX, TROPONINI in the last 168 hours. BNP (last 3 results) No results for input(s): PROBNP in the last 8760 hours. HbA1C: Recent Labs    08/16/19 0516  HGBA1C 6.1*   CBG: Recent Labs  Lab 08/15/19 2311  GLUCAP 84   Lipid Profile: Recent Labs    08/16/19 0516  CHOL 186  HDL 52  LDLCALC 108*  TRIG 128  CHOLHDL 3.6   Thyroid Function Tests: No results for input(s): TSH, T4TOTAL, FREET4, T3FREE, THYROIDAB in the last 72 hours. Anemia Panel: No results for input(s): VITAMINB12, FOLATE, FERRITIN, TIBC, IRON, RETICCTPCT in the last 72 hours. Sepsis Labs: No results for input(s): PROCALCITON, LATICACIDVEN in the last 168 hours.  Recent Results (from the past 240 hour(s))  SARS CORONAVIRUS 2 (TAT 6-24 HRS) Nasopharyngeal Nasopharyngeal Swab     Status: None   Collection Time: 08/15/19  9:40 PM   Specimen: Nasopharyngeal Swab  Result Value Ref Range Status   SARS Coronavirus 2 NEGATIVE NEGATIVE Final    Comment: (NOTE) SARS-CoV-2 target nucleic acids are NOT DETECTED. The SARS-CoV-2 RNA is generally detectable in upper and lower respiratory specimens during the acute phase of infection. Negative results do not preclude SARS-CoV-2 infection, do not rule out co-infections with other pathogens, and should not be used as the sole basis for treatment or other patient management decisions. Negative results must be combined with clinical observations, patient history, and epidemiological information. The expected result is Negative. Fact Sheet for Patients: HairSlick.no Fact Sheet for Healthcare Providers: quierodirigir.com This test is not yet approved or cleared by the [place] FDA and  has been authorized for detection and/or diagnosis of SARS-CoV-2 by FDA under an Emergency Use Authorization (EUA).  This EUA will remain  in effect (meaning this test can be used) for the duration of the COVID-19 declaration under Section 56 4(b)(1) of the Act, 21 U.S.C. section 360bbb-3(b)(1), unless the authorization is terminated or revoked sooner. Performed at [facility], [address] [place], [place] [address]       Radiology Studies: Ct Angio Head W  Or Wo Contrast  Result Date: 08/15/2019 CLINICAL DATA:  Code stroke. Neuro deficit for greater than 6 hours. Left-sided weakness. Last seen normal 7.5 hours ago. EXAM: CT ANGIOGRAPHY HEAD AND NECK TECHNIQUE: Multidetector CT imaging of the head and neck was performed using the standard protocol during bolus administration of intravenous contrast. Multiplanar CT image reconstructions and MIPs were obtained to evaluate the vascular anatomy. Carotid stenosis measurements (when applicable) are obtained utilizing NASCET criteria, using the distal internal carotid diameter as the denominator. CONTRAST:  OMNIPAQUE IOHEXOL 350 MG/ML SOLN COMPARISON:  CT head without contrast 08/15/2019 FINDINGS: CTA NECK FINDINGS Aortic arch: A 3 vessel arch configuration is present. Minimal distal atherosclerotic changes are present. There is no significant stenosis or aneurysm. Right carotid system: The right common carotid artery is within normal limits. The right internal carotid artery is occluded at the bifurcation. There is no reconstitution in the neck. Left carotid system: The left common carotid artery is within normal limits. Mild atherosclerotic changes are noted at the bifurcation. There is some tortuosity of the cervical left ICA without a significant stenosis. Vertebral arteries: The left vertebral artery is slightly dominant to the right. Both vertebral arteries originate from the subclavian arteries without significant stenosis. There is no significant stenosis of either vertebral artery in the neck. 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  Vertebral body heights and alignment are maintained. No focal lytic or blastic lesions are present. Other neck: The soft tissues the neck are unremarkable. No focal mucosal or submucosal lesions are present. Salivary glands are within normal limits. No significant adenopathy is present. Thyroid is normal. Upper chest: Lung apices demonstrate mild diffuse ground-glass attenuation. No focal nodule, mass, or airspace disease present. The thoracic inlet is normal. Review of the MIP images confirms the above findings CTA HEAD FINDINGS Anterior circulation: The right internal carotid artery is not reconstituted significantly below the ICA termini. There is minimal filling of the supraclinoid right ICA. The anterior communicating artery is patent. Study is mildly degraded by patient motion and skull base. The left internal carotid artery is unremarkable through the ICA terminus. The A1 and M1 segments are normal. Motion artifact obscures portions of the left M1 segment. Bifurcations are normal. There is attenuation of distal branch vessels on the right consistent with the infarct territory. No significant proximal anterior occlusion is present. There is some attenuation of distal ACA branches without a significant proximal stenosis or occlusion. Posterior circulation: The left vertebral artery is slightly dominant. Vertebrobasilar junction is normal. Basilar artery is normal. Both posterior cerebral arteries originate from the basilar tip. The PCA branch vessels are within normal limits. Venous sinuses: The dural sinuses are patent. Anatomic variants: None Review of the MIP images confirms the above findings IMPRESSION: 1. 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 2. Marked attenuation of distal posterior right MCA branches compatible with the more remote infarct. 3. No significant anterior stenosis or occlusion. 4. Left MCA branches are  normal. 5. Posterior circulation is unremarkable. Electronically Signed   By: Marin Roberts M.D.   On: 08/15/2019 21:43   Ct Angio Neck W Or Wo Contrast  Result Date: 08/15/2019 CLINICAL DATA:  Code stroke. Neuro deficit for greater than 6 hours. Left-sided weakness. Last seen normal 7.5 hours ago. EXAM: CT ANGIOGRAPHY HEAD AND NECK TECHNIQUE: Multidetector CT imaging of the head and neck was performed using the standard protocol during bolus administration of intravenous  contrast. Multiplanar CT image reconstructions and MIPs were obtained to evaluate the vascular anatomy. Carotid stenosis measurements (when applicable) are obtained utilizing NASCET criteria, using the distal internal carotid diameter as the denominator. CONTRAST:  193mL OMNIPAQUE IOHEXOL 350 MG/ML SOLN COMPARISON:  CT head without contrast 08/15/2019 FINDINGS: CTA NECK FINDINGS Aortic arch: A 3 vessel arch configuration is present. Minimal distal atherosclerotic changes are present. There is no significant stenosis or aneurysm. Right carotid system: The right common carotid artery is within normal limits. The right internal carotid artery is occluded at the bifurcation. There is no reconstitution in the neck. Left carotid system: The left common carotid artery is within normal limits. Mild atherosclerotic changes are noted at the bifurcation. There is some tortuosity of the cervical left ICA without a significant stenosis. Vertebral arteries: The left vertebral artery is slightly dominant to the right. Both vertebral arteries originate from the subclavian arteries without significant stenosis. There is no significant stenosis of either vertebral artery in the neck. 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 Vertebral body heights and alignment are maintained. No focal lytic or blastic lesions are present. Other neck: The soft tissues the neck are unremarkable. No focal mucosal or submucosal lesions are present. Salivary glands are  within normal limits. No significant adenopathy is present. Thyroid is normal. Upper chest: Lung apices demonstrate mild diffuse ground-glass attenuation. No focal nodule, mass, or airspace disease present. The thoracic inlet is normal. Review of the MIP images confirms the above findings CTA HEAD FINDINGS Anterior circulation: The right internal carotid artery is not reconstituted significantly below the ICA termini. There is minimal filling of the supraclinoid right ICA. The anterior communicating artery is patent. Study is mildly degraded by patient motion and skull base. The left internal carotid artery is unremarkable through the ICA terminus. The A1 and M1 segments are normal. Motion artifact obscures portions of the left M1 segment. Bifurcations are normal. There is attenuation of distal branch vessels on the right consistent with the infarct territory. No significant proximal anterior occlusion is present. There is some attenuation of distal ACA branches without a significant proximal stenosis or occlusion. Posterior circulation: The left vertebral artery is slightly dominant. Vertebrobasilar junction is normal. Basilar artery is normal. Both posterior cerebral arteries originate from the basilar tip. The PCA branch vessels are within normal limits. Venous sinuses: The dural sinuses are patent. Anatomic variants: None Review of the MIP images confirms the above findings IMPRESSION: 1. 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 2. Marked attenuation of distal posterior right MCA branches compatible with the more remote infarct. 3. No significant anterior stenosis or occlusion. 4. Left MCA branches are normal. 5. Posterior circulation is unremarkable. Electronically Signed   By: San Morelle M.D.   On: 08/15/2019 21:43   Mr Brain Wo Contrast  Result Date: 08/16/2019 CLINICAL DATA:  Acute onset of left upper and  lower extremity weakness beginning at 2 p.m. yesterday. EXAM: MRI HEAD WITHOUT CONTRAST TECHNIQUE: Multiplanar, multiecho pulse sequences of the brain and surrounding structures were obtained without intravenous contrast. COMPARISON:  CT head and CTA head and neck 08/15/2019 FINDINGS: Brain: The diffusion-weighted images confirm confluent nonhemorrhagic cortical infarct involving the anterior superior right frontal lobe. There is involvement of the middle and inferior right frontal gyrus. Additional punctate white matter infarcts are present along the associated cortical spinal tracts. Acute nonhemorrhagic infarcts also involves the right caudate head, anterior limb of the  right internal capsule, and anterior right lentiform nucleus. The previous posterior right MCA infarct is redemonstrated. T2 hyperintensities associated with the areas of acute/subacute infarction. There is volume loss in the more remote posterior right MCA territory infarcts. Mild periventricular and subcortical T2 hyperintensities are present in the left hemisphere. Ventricles are otherwise normal in size. White matter changes extend into the brainstem. The cerebellum is normal. Vascular: Right ICA occlusion is again noted. There is no flow in the right MCA and ACA. There is chronic attenuation of posterior right MCA branches. No significant proximal occlusion is present. Flow is present in the posterior circulation. Skull and upper cervical spine: The craniocervical junction is normal. Upper cervical spine is within normal limits. Marrow signal is unremarkable. Sinuses/Orbits: The paranasal sinuses and mastoid air cells are clear. The globes and orbits are within normal limits. IMPRESSION: 1. Acute/subacute nonhemorrhagic cortical infarct involving the anterior superior right frontal lobe, right caudate head, anterior limb of the right internal capsule, and anterior right lentiform nucleus. 2. Remote posterior right MCA territory infarct. 3.  Chronic occlusion of the right ICA. 4. Chronic attenuation of posterior right MCA branches. Electronically Signed   By: Marin Roberts M.D.   On: 08/16/2019 12:27   Ct Head Code Stroke Wo Contrast`  Result Date: 08/15/2019 CLINICAL DATA:  Code stroke. Neuro deficit for less than 6 hours. Left-sided weakness. EXAM: CT HEAD WITHOUT CONTRAST TECHNIQUE: Contiguous axial images were obtained from the base of the skull through the vertex without intravenous contrast. COMPARISON:  CT head without contrast 04/28/2015 FINDINGS: Brain: The posterior right MCA territory infarct is new since the prior study, but not acute. There is volume loss with ex vacuo dilation of the right lateral ventricle. There is subtle changes in the super ganglionic right precentral gyrus. Basal ganglia are intact. Insular ribbon is normal. No significant extra-axial fluid collection is present. There is loss of gray-white differentiation more anteriorly in the high right frontal lobe as well. Left hemisphere is unremarkable. Insert normal brainstem Vascular: No hyperdense vessel or unexpected calcification. Skull: Calvarium is intact. No focal lytic or blastic lesions are present. Sinuses/Orbits: The globes and orbits are within normal limits. The paranasal sinuses and mastoid air cells are clear. ASPECTS [facility] Stroke Program Early CT Score) - Ganglionic level infarction (caudate, lentiform nuclei, internal capsule, insula, M1-M3 cortex): 7/7 - Supraganglionic infarction (M4-M6 cortex): 1/3 Total score (0-10 with 10 being normal): 8/10 IMPRESSION: 1. Areas of acute/subacute nonhemorrhagic infarct in the high right frontal lobe are new since the prior study. 2. New but remote posterior right MCA territory infarcts. 3. No acute vascular lesion. 4. ASPECTS is 8/10 The above was relayed via text pager to Dr. Arther Dames on 08/15/2019 at 20:44 . Electronically Signed   By: Marin Roberts M.D.   On: 08/15/2019 20:45   Vas US  Carotid  Result Date: 08/16/2019 Carotid Arterial Duplex Study Indications:       CVA and Weakness. Risk Factors:      Hypertension. Other Factors:     CTA neck - right ICA occlusion. Comparison Study:  no prior Performing Technologist: Jeb Levering RDMS, RVT  Examination Guidelines: A complete evaluation includes B-mode imaging, spectral Doppler, color Doppler, and power Doppler as needed of all accessible portions of each vessel. Bilateral testing is considered an integral part of a complete examination. Limited examinations for reoccurring indications may be performed as noted.  Right Carotid Findings: +----------+--------+-------+--------+-----------------+----------------------+  PSV cm/s EDV     Stenosis Plaque            Comments                                     cm/s             Description                               +----------+--------+-------+--------+-----------------+----------------------+  CCA Prox   86       10                                                         +----------+--------+-------+--------+-----------------+----------------------+  CCA Distal 54       11                                                         +----------+--------+-------+--------+-----------------+----------------------+  ICA Prox                    Occluded                   Thrombus vs soft                                                                plaque                  +----------+--------+-------+--------+-----------------+----------------------+  ECA        92       16                                                         +----------+--------+-------+--------+-----------------+----------------------+ +----------+--------+-------+----------------+-------------------+             PSV cm/s EDV cms Describe         Arm Pressure (mmHG)  +----------+--------+-------+----------------+-------------------+  Subclavian 125              Multiphasic, WNL                       +----------+--------+-------+----------------+-------------------+ +---------+--------+--+--------+--+---------+  Vertebral PSV cm/s 65 EDV cm/s 20 Antegrade  +---------+--------+--+--------+--+---------+  Left Carotid Findings: +----------+--------+--------+--------+------------------+--------+             PSV cm/s EDV cm/s Stenosis Plaque Description Comments  +----------+--------+--------+--------+------------------+--------+  CCA Prox   130      34                                             +----------+--------+--------+--------+------------------+--------+  CCA Distal 129      36                                             +----------+--------+--------+--------+------------------+--------+  ICA Prox   152      53       40-59%   heterogenous                 +----------+--------+--------+--------+------------------+--------+  ICA Distal 119      41                                             +----------+--------+--------+--------+------------------+--------+  ECA        100      18                                             +----------+--------+--------+--------+------------------+--------+ +----------+--------+--------+----------------+-------------------+             PSV cm/s EDV cm/s Describe         Arm Pressure (mmHG)  +----------+--------+--------+----------------+-------------------+  Subclavian 193               Multiphasic, WNL                      +----------+--------+--------+----------------+-------------------+ +---------+--------+--+--------+--+---------+  Vertebral PSV cm/s 66 EDV cm/s 18 Antegrade  +---------+--------+--+--------+--+---------+  Summary: Right Carotid: Evidence consistent with a total occlusion of the right ICA.                Sonographic appearance of thrombus versus soft plaque in the ICA. Left Carotid: Velocities in the left ICA are consistent with a 40-59% stenosis.               Elevated velocities may be due to compensatory flow from the               contralateral  occlusion.  *See table(s) above for measurements and observations.  Electronically signed by Waverly Ferrari MD on 08/16/2019 at 5:58:33 PM.    Final    Vas [place] Lower Extremity Venous (dvt)  Result Date: 08/16/2019  Lower Venous Study Indications: Stroke.  Comparison Study: no prior Performing Technologist: Jeb Levering RDMS, RVT  Examination Guidelines: A complete evaluation includes B-mode imaging, spectral Doppler, color Doppler, and power Doppler as needed of all accessible portions of each vessel. Bilateral testing is considered an integral part of a complete examination. Limited examinations for reoccurring indications may be performed as noted.  +---------+---------------+---------+-----------+----------+--------------+  RIGHT     Compressibility Phasicity Spontaneity Properties Thrombus Aging  +---------+---------------+---------+-----------+----------+--------------+  CFV       Full            Yes       Yes                                    +---------+---------------+---------+-----------+----------+--------------+  SFJ       Full                                                             +---------+---------------+---------+-----------+----------+--------------+  FV Prox   Full                                                             +---------+---------------+---------+-----------+----------+--------------+  FV Mid    Full                                                             +---------+---------------+---------+-----------+----------+--------------+  FV Distal Full                                                             +---------+---------------+---------+-----------+----------+--------------+  PFV       Full                                                             +---------+---------------+---------+-----------+----------+--------------+  POP       Full            Yes       Yes                                     +---------+---------------+---------+-----------+----------+--------------+  PTV       Full                                                             +---------+---------------+---------+-----------+----------+--------------+  PERO      Full                                                             +---------+---------------+---------+-----------+----------+--------------+   +---------+---------------+---------+-----------+----------+--------------+  LEFT      Compressibility Phasicity Spontaneity Properties Thrombus Aging  +---------+---------------+---------+-----------+----------+--------------+  CFV       Full            Yes       Yes                                    +---------+---------------+---------+-----------+----------+--------------+  SFJ       Full                                                             +---------+---------------+---------+-----------+----------+--------------+  FV Prox   Full                                                             +---------+---------------+---------+-----------+----------+--------------+  FV Mid    Full                                                             +---------+---------------+---------+-----------+----------+--------------+  FV Distal Full                                                             +---------+---------------+---------+-----------+----------+--------------+  PFV       Full                                                             +---------+---------------+---------+-----------+----------+--------------+  POP       Full            Yes       Yes                                    +---------+---------------+---------+-----------+----------+--------------+  PTV       Full                                                             +---------+---------------+---------+-----------+----------+--------------+  PERO      Full                                                              +---------+---------------+---------+-----------+----------+--------------+     Summary: Right: There is no evidence of deep vein thrombosis in the lower extremity. No cystic structure found in the popliteal fossa. Left: There is no evidence of deep vein thrombosis in the lower extremity. No cystic structure found in the popliteal fossa.  *See table(s) above for measurements and observations. Electronically signed by Waverly Ferrari MD on 08/16/2019 at 5:58:15 PM.    Final      Scheduled Meds:  aspirin EC  325 mg Oral Daily   atorvastatin  40 mg Oral q1800   buPROPion  150 mg Oral Daily   clopidogrel  75 mg Oral Daily   DULoxetine  60 mg Oral Daily   enoxaparin (LOVENOX) injection  40 mg Subcutaneous Q24H  escitalopram  10 mg Oral Daily   omega-3 acid ethyl esters  1,000 mg Oral Q breakfast   pregabalin  150 mg Oral BID   tiZANidine  2 mg Oral QHS   Continuous Infusions:   LOS: 2 days    Time spent: 25 minutes spent in the coordination of care today.    Teddy Spike, DO [facility] Pager [phone]  If 7PM-7AM, please contact night-coverage [email or web address] Password [email or web address] 08/17/2019, 1:26 PM

## 2019-08-18 LAB — ANTI-DNA ANTIBODY, DOUBLE-STRANDED: ds DNA Ab: 1 IU/mL (ref 0–9)

## 2019-08-18 LAB — CARDIOLIPIN ANTIBODIES, IGG, IGM, IGA
Anticardiolipin IgA: <9 APL U/mL (ref 0–11)
Anticardiolipin IgG: <9 GPL U/mL (ref 0–14)
Anticardiolipin IgM: 9 MPL U/mL (ref 0–12)

## 2019-08-18 LAB — T.PALLIDUM AB, TOTAL: T Pallidum Abs: NONREACTIVE

## 2019-08-18 MED ORDER — LISINOPRIL 20 MG PO TABS: 20.0000 mg | ORAL_TABLET | Freq: Every day | ORAL | Status: DC

## 2019-08-18 MED ORDER — LISINOPRIL 10 MG PO TABS
10.0000 mg | ORAL_TABLET | Freq: Every day | ORAL | Status: DC
  Administered 2019-08-18 – 2019-08-21 (×4): 10 mg via ORAL
  Filled 2019-08-18 (×4): qty 1

## 2019-08-18 NOTE — Consult Note (Signed)
Inpatient Rehab Admissions:  Inpatient Rehab Consult received.  I met with pt at the bedside for rehabilitation assessment and to discuss goals and expectations of an inpatient rehab admission. Feel pt is a great candidate for CIR. She was independent PTA, working full time and driving. Pt is motivated to return to [place] and has great social support at [place]. Confirmed assist from husband via phone. Pt would like to pursue CIR at this time. I will begin insurance authorization process for possible admit.   Jhonnie Garner, OTR/L  Rehab Admissions Coordinator  [phone] 08/18/2019 10:14 AM

## 2019-08-18 NOTE — Progress Notes (Signed)
[facility]  PROGRESS NOTE    Terri Wood  AJO:[id number] DOB: 1966-07-30 DOA: 08/15/2019 PCP: Glendon Axe, MD   Brief Narrative:   Terri Wood a 53 y.o.femalewithhistory of hypertension, fibromyalgia, anxiety, tobacco abuse presents to the ER after patient was noticed to have left upper and lower extremity weakness. Patient states her weakness started around 2 PM yesterday. Which happened suddenly. At around 7 PM when she was trying to walk she fell and EMS was called. EMS noticed the weakness and was brought to the ER. Denies any difficulty speaking swallowing.Patient was recently placed on antibiotics and prednisone for sinusitis.  11/1: No acute events ON. She's worried about not being able to move her arm. 11/2: CIR not in network. Working on rehab options.    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  HTN     - BP acceptable now off meds     - resume meds as tolerated in AM     - 11/2: resume lisinopril at 10mg   Pre-diabetes     - counseled on DM, lifestyle changes     - follow up with PCP  Depression/anxiety     - lexapro, wellbutrin  Tobacco abuse     - counseled against further use  Morbid obesity     - diet, exercise counseled  Fibromyalgia     - cymbalta, zanaflex, ultram  Reactive RPR titre     - spoke with ID; likely previously treated or false negative; total ab pending.  Insurance doesn't work for [employer] Working on other rehab options. Resume lisinopril at half home dose. Monitor.   DVT  prophylaxis: lovenox Code Status: FULL   Disposition Plan: TBD  Consultants:   Neurology  ROS:  Denies CP, dyspnea, ab pain, N/V . Remainder 10-pt ROS is negative for all not previously mentioned.  Subjective: "They made me draw a clock."  Objective: Vitals:   08/18/19 0043 08/18/19 0441 08/18/19 0800 08/18/19 1155  BP: (!) 120/54 (!) 155/83 139/78 (!) 141/83  Pulse: 78 79 71 73  Resp: 16 14 18 20   Temp: 98.3 F (36.8 C) 98.4 F (36.9 C) 98.2 F (36.8 C) 97.9 F (36.6 C)  TempSrc: Oral Oral Oral Oral  SpO2: 100% 94% 97% 96%  Weight:      Height:        Intake/Output Summary (Last 24 hours) at 08/18/2019 1204 Last data filed at 08/18/2019 9470 Gross per 24 hour  Intake 600 ml  Output 4200 ml  Net -3600 ml   Filed Weights   08/15/19 2108  Weight: 106.6 kg    Examination:  General: 53 y.o. female resting in bed in NAD Eyes: PERRL, normal sclera ENMT: Nares patent w/o discharge, orophaynx clear, dentition normal, ears w/o discharge/lesions/ulcers Cardiovascular: RRR, +S1, S2, no m/g/r Respiratory: CTABL, no w/r/r GI: BS+, NDNT, no masses noted, no organomegaly noted MSK: No e/c/c Neuro: A&O  x 3, LUE/LLE weakness Psyc: Appropriate interaction and affect, calm/cooperative   Data Reviewed: I have personally reviewed following labs and imaging studies.  CBC: Recent Labs  Lab 08/15/19 2112 08/15/19 2255 08/16/19 0516 08/17/19 0245  WBC 11.0*  --  11.5* 9.4  NEUTROABS 7.8*  --   --  5.1  HGB 12.1 12.9 12.4 11.9*  HCT 38.5 38.0 39.1 37.6  MCV 86.9  --  85.9 85.6  PLT 227  --  369 314   Basic Metabolic Panel: Recent Labs  Lab 08/15/19 2255 08/15/19 2320 08/16/19 0516 08/17/19 0245  NA 137 139  --  138  K 5.0 4.0  --  4.1  CL 103 103  --  105  CO2  --  25  --  25  GLUCOSE 70 92  --  101*  BUN 27* 16  --  17  CREATININE 0.90 0.87 0.89 0.79  CALCIUM  --  9.1  --  9.0  MG  --   --   --  2.0  PHOS  --   --   --  3.6   GFR: Estimated Creatinine  Clearance: 91.8 mL/min (by C-G formula based on SCr of 0.79 mg/dL). Liver Function Tests: Recent Labs  Lab 08/15/19 2320 08/17/19 0245  AST 20  --   ALT 23  --   ALKPHOS 52  --   BILITOT 0.4  --   PROT 6.2*  --   ALBUMIN 3.4* 3.4*   No results for input(s): LIPASE, AMYLASE in the last 168 hours. No results for input(s): AMMONIA in the last 168 hours. Coagulation Profile: Recent Labs  Lab 08/15/19 2320  INR 1.0   Cardiac Enzymes: No results for input(s): CKTOTAL, CKMB, CKMBINDEX, TROPONINI in the last 168 hours. BNP (last 3 results) No results for input(s): PROBNP in the last 8760 hours. HbA1C: Recent Labs    08/16/19 0516  HGBA1C 6.1*   CBG: Recent Labs  Lab 08/15/19 2311  GLUCAP 84   Lipid Profile: Recent Labs    08/16/19 0516  CHOL 186  HDL 52  LDLCALC 108*  TRIG 128  CHOLHDL 3.6   Thyroid Function Tests: No results for input(s): TSH, T4TOTAL, FREET4, T3FREE, THYROIDAB in the last 72 hours. Anemia Panel: No results for input(s): VITAMINB12, FOLATE, FERRITIN, TIBC, IRON, RETICCTPCT in the last 72 hours. Sepsis Labs: No results for input(s): PROCALCITON, LATICACIDVEN in the last 168 hours.  Recent Results (from the past 240 hour(s))  SARS CORONAVIRUS 2 (TAT 6-24 HRS) Nasopharyngeal Nasopharyngeal Swab     Status: None   Collection Time: 08/15/19  9:40 PM   Specimen: Nasopharyngeal Swab  Result Value Ref Range Status   SARS Coronavirus 2 NEGATIVE NEGATIVE Final    Comment: (NOTE) SARS-CoV-2 target nucleic acids are NOT DETECTED. The SARS-CoV-2 RNA is generally detectable in upper and lower respiratory specimens during the acute phase of infection. Negative results do not preclude SARS-CoV-2 infection, do not rule out co-infections with other pathogens, and should not be used as the sole basis for treatment or other patient management decisions. Negative results must be combined with clinical observations, patient history, and epidemiological  information. The expected result is Negative. Fact Sheet for Patients: HairSlick.no Fact Sheet for Healthcare Providers: quierodirigir.com This test is not yet approved or cleared by the [place] FDA and  has been authorized for detection and/or diagnosis of SARS-CoV-2 by FDA under an Emergency Use Authorization (EUA). This EUA will remain  in effect (meaning this test  can be used) for the duration of the COVID-19 declaration under Section 56 4(b)(1) of the Act, 21 U.S.C. section 360bbb-3(b)(1), unless the authorization is terminated or revoked sooner. Performed at [facility], [address] [place], [place] [address]       Radiology Studies: Vas Koreas Carotid  Result Date: 08/16/2019 Carotid Arterial Duplex Study Indications:       CVA and Weakness. Risk Factors:      Hypertension. Other Factors:     CTA neck - right ICA occlusion. Comparison Study:  no prior Performing Technologist: Jeb LeveringJill Parker RDMS, RVT  Examination Guidelines: A complete evaluation includes B-mode imaging, spectral Doppler, color Doppler, and power Doppler as needed of all accessible portions of each vessel. Bilateral testing is considered an integral part of a complete examination. Limited examinations for reoccurring indications may be performed as noted.  Right Carotid Findings: +----------+--------+-------+--------+-----------------+----------------------+             PSV cm/s EDV     Stenosis Plaque            Comments                                     cm/s             Description                               +----------+--------+-------+--------+-----------------+----------------------+  CCA Prox   86       10                                                         +----------+--------+-------+--------+-----------------+----------------------+  CCA Distal 54       11                                                          +----------+--------+-------+--------+-----------------+----------------------+  ICA Prox                    Occluded                   Thrombus vs soft                                                                plaque                  +----------+--------+-------+--------+-----------------+----------------------+  ECA        92       16                                                         +----------+--------+-------+--------+-----------------+----------------------+ +----------+--------+-------+----------------+-------------------+  PSV cm/s EDV cms Describe         Arm Pressure (mmHG)  +----------+--------+-------+----------------+-------------------+  Subclavian 125              Multiphasic, WNL                      +----------+--------+-------+----------------+-------------------+ +---------+--------+--+--------+--+---------+  Vertebral PSV cm/s 65 EDV cm/s 20 Antegrade  +---------+--------+--+--------+--+---------+  Left Carotid Findings: +----------+--------+--------+--------+------------------+--------+             PSV cm/s EDV cm/s Stenosis Plaque Description Comments  +----------+--------+--------+--------+------------------+--------+  CCA Prox   130      34                                             +----------+--------+--------+--------+------------------+--------+  CCA Distal 129      36                                             +----------+--------+--------+--------+------------------+--------+  ICA Prox   152      53       40-59%   heterogenous                 +----------+--------+--------+--------+------------------+--------+  ICA Distal 119      41                                             +----------+--------+--------+--------+------------------+--------+  ECA        100      18                                             +----------+--------+--------+--------+------------------+--------+ +----------+--------+--------+----------------+-------------------+             PSV  cm/s EDV cm/s Describe         Arm Pressure (mmHG)  +----------+--------+--------+----------------+-------------------+  Subclavian 193               Multiphasic, WNL                      +----------+--------+--------+----------------+-------------------+ +---------+--------+--+--------+--+---------+  Vertebral PSV cm/s 66 EDV cm/s 18 Antegrade  +---------+--------+--+--------+--+---------+  Summary: Right Carotid: Evidence consistent with a total occlusion of the right ICA.                Sonographic appearance of thrombus versus soft plaque in the ICA. Left Carotid: Velocities in the left ICA are consistent with a 40-59% stenosis.               Elevated velocities may be due to compensatory flow from the               contralateral occlusion.  *See table(s) above for measurements and observations.  Electronically signed by Waverly Ferrari MD on 08/16/2019 at 5:58:33 PM.    Final    Vas [place] Lower Extremity Venous (dvt)  Result Date: 08/16/2019  Lower Venous Study Indications: Stroke.  Comparison Study: no prior Performing Technologist: Jeb Levering RDMS, RVT  Examination Guidelines: A complete  evaluation includes B-mode imaging, spectral Doppler, color Doppler, and power Doppler as needed of all accessible portions of each vessel. Bilateral testing is considered an integral part of a complete examination. Limited examinations for reoccurring indications may be performed as noted.  +---------+---------------+---------+-----------+----------+--------------+  RIGHT     Compressibility Phasicity Spontaneity Properties Thrombus Aging  +---------+---------------+---------+-----------+----------+--------------+  CFV       Full            Yes       Yes                                    +---------+---------------+---------+-----------+----------+--------------+  SFJ       Full                                                             +---------+---------------+---------+-----------+----------+--------------+  FV  Prox   Full                                                             +---------+---------------+---------+-----------+----------+--------------+  FV Mid    Full                                                             +---------+---------------+---------+-----------+----------+--------------+  FV Distal Full                                                             +---------+---------------+---------+-----------+----------+--------------+  PFV       Full                                                             +---------+---------------+---------+-----------+----------+--------------+  POP       Full            Yes       Yes                                    +---------+---------------+---------+-----------+----------+--------------+  PTV       Full                                                             +---------+---------------+---------+-----------+----------+--------------+  PERO  Full                                                             +---------+---------------+---------+-----------+----------+--------------+   +---------+---------------+---------+-----------+----------+--------------+  LEFT      Compressibility Phasicity Spontaneity Properties Thrombus Aging  +---------+---------------+---------+-----------+----------+--------------+  CFV       Full            Yes       Yes                                    +---------+---------------+---------+-----------+----------+--------------+  SFJ       Full                                                             +---------+---------------+---------+-----------+----------+--------------+  FV Prox   Full                                                             +---------+---------------+---------+-----------+----------+--------------+  FV Mid    Full                                                             +---------+---------------+---------+-----------+----------+--------------+  FV Distal Full                                                              +---------+---------------+---------+-----------+----------+--------------+  PFV       Full                                                             +---------+---------------+---------+-----------+----------+--------------+  POP       Full            Yes       Yes                                    +---------+---------------+---------+-----------+----------+--------------+  PTV       Full                                                             +---------+---------------+---------+-----------+----------+--------------+  PERO      Full                                                             +---------+---------------+---------+-----------+----------+--------------+     Summary: Right: There is no evidence of deep vein thrombosis in the lower extremity. No cystic structure found in the popliteal fossa. Left: There is no evidence of deep vein thrombosis in the lower extremity. No cystic structure found in the popliteal fossa.  *See table(s) above for measurements and observations. Electronically signed by Waverly Ferrari MD on 08/16/2019 at 5:58:15 PM.    Final      Scheduled Meds:  aspirin EC  325 mg Oral Daily   atorvastatin  40 mg Oral q1800   buPROPion  150 mg Oral Daily   clopidogrel  75 mg Oral Daily   DULoxetine  60 mg Oral Daily   enoxaparin (LOVENOX) injection  40 mg Subcutaneous Q24H   escitalopram  10 mg Oral Daily   omega-3 acid ethyl esters  1,000 mg Oral Q breakfast   pregabalin  150 mg Oral BID   tiZANidine  2 mg Oral QHS   Continuous Infusions:   LOS: 3 days    Time spent: 25 minutes spent in the coordination of care today.    Teddy Spike, DO [facility] Pager [phone]  If 7PM-7AM, please contact night-coverage [email or web address] Password [facility] 08/18/2019, 12:04 PM

## 2019-08-18 NOTE — TOC Initial Note (Addendum)
Transition of Care [facility]) - Initial/Assessment Note    Patient Details  Name: Terri Wood MRN: [id number] Date of Birth: May 10, 1966  Transition of Care [facility]) CM/SW Contact:    Benard Halsted, LCSW Phone Number: 08/18/2019, 3:24 PM  Clinical Narrative:                 CSW received consult that patient is requesting an outside inpatient rehab facility that is in network with her insurance. CSW left voicemail for [facility] and faxed info to [facility].   Expected Discharge Plan: IP Rehab Facility Barriers to Discharge: Other (comment)(Pending response from outside IP facilities)   Patient Goals and CMS Choice Patient states their goals for this hospitalization and ongoing recovery are:: Return home CMS Medicare.gov Compare Post Acute Care list provided to:: Patient Choice offered to / list presented to : Patient  Expected Discharge Plan and Services Expected Discharge Plan: [place] In-house Referral: Clinical Social Work Discharge Planning Services: CM Consult Post Acute Care Choice: IP Rehab Living arrangements for the past 2 months: Single Family Home                 DME Arranged: N/A                    Prior Living Arrangements/Services Living arrangements for the past 2 months: Single Family Home Lives with:: Spouse Patient language and need for interpreter reviewed:: Yes Do you feel safe going back to the place where you live?: Yes      Need for Family Participation in Patient Care: Yes (Comment) Care giver support system in place?: Yes (comment)   Criminal Activity/Legal Involvement Pertinent to Current Situation/Hospitalization: No - Comment as needed  Activities of Daily Living Home Assistive Devices/Equipment: None ADL Screening (condition at time of admission) Patient's cognitive ability adequate to safely complete daily activities?: Yes Is the patient deaf or have difficulty hearing?: No Does the patient have difficulty seeing, even  when wearing glasses/contacts?: No Does the patient have difficulty concentrating, remembering, or making decisions?: No Patient able to express need for assistance with ADLs?: Yes Does the patient have difficulty dressing or bathing?: No Independently performs ADLs?: Yes (appropriate for developmental age) Does the patient have difficulty walking or climbing stairs?: No Weakness of Legs: Left Weakness of Arms/Hands: Left  Permission Sought/Granted Permission sought to share information with : Facility Sport and exercise psychologist, Family Supports    Share Information with NAME: Tommie Raymond  Permission granted to share info w AGENCY: IP Rehab  Permission granted to share info w Relationship: Spouse  Permission granted to share info w Contact Information: [phone]  Emotional Assessment Appearance:: Appears stated age Attitude/Demeanor/Rapport: Sedated Affect (typically observed): Accepting, Appropriate Orientation: : Oriented to Self, Oriented to Place, Oriented to Situation, Oriented to  Time Alcohol / Substance Use: Not Applicable Psych Involvement: No (comment)  Admission diagnosis:  Acute ischemic stroke [facility]) [I63.9] Patient Active Problem List   Diagnosis Date Noted  . Essential hypertension 08/16/2019  . Fibromyalgia 08/16/2019  . Hyperlipidemia   . Smoker   . Acute CVA (cerebrovascular accident) ([place]) 08/15/2019  . Avascular necrosis of left femoral head ([place]) 04/09/2018   PCP:  Glendon Axe, MD Pharmacy:   [id number] [address], [place] [address] Phone: [phone] Fax: [phone]     Social Determinants of Health (SDOH) Interventions  Readmission Risk Interventions No flowsheet data found.

## 2019-08-18 NOTE — Progress Notes (Signed)
Physical Therapy Treatment Patient Details Name: Terri Wood MRN: [id number] DOB: 04-Feb-1966 Today's Date: 08/18/2019    History of Present Illness Terri Wood is a 53 y.o. female with history of hypertension, fibromyalgia, anxiety, tobacco abuse presents to the ER after patient was noticed to have left upper and lower extremity weakness. 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     PT Comments    Pt doing much better today - left leg stronger, improved balance sitting and standing.  Pt happy to be moving. She should do great in rehab.  Will walk to morrow with +2 help.  Follow Up Recommendations  CIR;Supervision/Assistance - 24 hour     Equipment Recommendations  Other (comment)    Recommendations for Other Services Rehab consult     Precautions / Restrictions Precautions Precautions: Fall Precaution Comments: Lt hemiparesis  Restrictions Weight Bearing Restrictions: No    Mobility  Bed Mobility Overal bed mobility: Needs Assistance Bed Mobility: Supine to Sit;Sit to Supine     Supine to sit: Min assist Sit to supine: Min assist   General bed mobility comments: assist to to guide Lt LE off the bed and to lift trunk.  She needed assist to position hips once back in bed.  She was able to lift bil. LEs onto bed   Transfers Overall transfer level: Needs assistance Equipment used: Rolling walker (2 wheeled) Transfers: Sit to/from Stand Sit to Stand: Min assist         General transfer comment: Pt abel to stand up from EOB with min assist - blocking left leg for safety but not needed, and keeping left UE supported.  pt did 5 sit to stands x 2 sets today  Ambulation/Gait             General Gait Details: I didnt have assist.  did pregait - step forward and backwards x 10 reps x 2 sets with right UE, knee bends etc.   Stairs             Wheelchair Mobility    Modified Rankin (Stroke Patients Only) Modified Rankin (Stroke Patients  Only) Pre-Morbid Rankin Score: No symptoms Modified Rankin: Severe disability     Balance Overall balance assessment: Needs assistance   Sitting balance-Leahy Scale: Good       Standing balance-Leahy Scale: Fair Standing balance comment: stood with right hand on RW - good balance today - much imiproved                            Cognition Arousal/Alertness: Awake/alert Behavior During Therapy: WFL for tasks assessed/performed Overall Cognitive Status: Within Functional Limits for tasks assessed                                 General Comments: Pt cooperative and happy to get up      Exercises Total Joint Exercises Straight Leg Raises: AROM;Left;15 reps;Supine Bridges: AROM;15 reps;Supine Other Exercises Other Exercises: standing exercises - with RW - glut squeezes, mini knee bends, step forwad and back wtih right, even heel raises x 2 sets of 10 reps    General Comments        Pertinent Vitals/Pain Pain Assessment: No/denies pain    Home Living                      Prior Function  PT Goals (current goals can now be found in the care plan section) Progress towards PT goals: Progressing toward goals    Frequency    Min 4X/week      PT Plan Current plan remains appropriate    Co-evaluation              AM-PAC PT "6 Clicks" Mobility   Outcome Measure  Help needed turning from your back to your side while in a flat bed without using bedrails?: A Little Help needed moving from lying on your back to sitting on the side of a flat bed without using bedrails?: A Lot Help needed moving to and from a bed to a chair (including a wheelchair)?: A Lot Help needed standing up from a chair using your arms (e.g., wheelchair or bedside chair)?: A Lot Help needed to walk in hospital room?: Total Help needed climbing 3-5 steps with a railing? : Total 6 Click Score: 11    End of Session   Activity Tolerance: Patient  tolerated treatment well Patient left: in bed;with call bell/phone within reach;with bed alarm set Nurse Communication: Mobility status PT Visit Diagnosis: Unsteadiness on feet (R26.81);Hemiplegia and hemiparesis;Difficulty in walking, not elsewhere classified (R26.2) Hemiplegia - Right/Left: Left Hemiplegia - dominant/non-dominant: Non-dominant Hemiplegia - caused by: Cerebral infarction     Time: 8099-8338 PT Time Calculation (min) (ACUTE ONLY): 28 min  Charges:  $Therapeutic Activity: 8-22 mins $Neuromuscular Re-education: 8-22 mins                     08/18/2019   Rande Lawman, PT    Loyal Buba 08/18/2019, 4:24 PM

## 2019-08-18 NOTE — Progress Notes (Signed)
Inpatient Rehabilitation-Admissions Coordinator   Unfortunately, pt's insurance plan is out of network with our CIR program at [facility]. Discussed her out of network benefits and at this time, pt does not want to pursue CIR here at [facility] due to financial reasons. She does want to pursue CIR at an in-network facility. I have contacted TOC team to follow up and possibly attempt admission to other [place] facilities.   AC will sign off.   Jhonnie Garner, OTR/L  Rehab Admissions Coordinator  [phone] 08/18/2019 11:28 AM

## 2019-08-19 NOTE — Progress Notes (Signed)
[name]  PROGRESS NOTE    Terri Wood  MLY:[id number] DOB: [birth year] DOA: 08/15/2019 PCP: Leotis Shames, MD   Brief Narrative:   Terri Wood a 53 y.o.femalewithhistory of hypertension, fibromyalgia, anxiety, tobacco abuse presents to the ER after patient was noticed to have left upper and lower extremity weakness. Patient states her weakness started around 2 PM yesterday. Which happened suddenly. At around 7 PM when she was trying to walk she fell and EMS was called. EMS noticed the weakness and was brought to the ER. Denies any difficulty speaking swallowing.Patient was recently placed on antibiotics and prednisone for sinusitis.  11/1: No acute events ON. She's worried about not being able to move her arm. 11/2: CIR not in network. Working on rehab options.  11/3: Pt's insurance is in network with [facility]. CM working on [facility] CIR placement. No acute events ON.    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  HTN - BP acceptable now off meds - resume meds as tolerated in AM     - 11/2: resume lisinopril at 10mg      - 11/3: BP is acceptable, will leave at this dose for now  Pre-diabetes - counseled on DM, lifestyle changes - follow up with PCP  Depression/anxiety - lexapro, wellbutrin  Tobacco abuse - counseled against further use  Morbid obesity - diet, exercise counseled  Fibromyalgia - cymbalta, zanaflex, ultram  Reactive RPR titre     - spoke with ID; likely previously treated or  false negative; total ab non-reactive.  [facility] CIR reviewing. Patient stable for transfer to rehab. Awaiting options.   DVT prophylaxis: lovenox Code Status: FULL   Disposition Plan: TBD  Consultants:   Neurology  ROS:  Denies N, V, dizziness, CP, dyspnea . Remainder 10-pt ROS is negative for all not previously mentioned.  Subjective: "I think I'll get to go to [facility]"  Objective: Vitals:   08/19/19 0052 08/19/19 0445 08/19/19 0757 08/19/19 1145  BP: 128/72 129/83 132/70 119/65  Pulse: 69 65 72 66  Resp: 18 18 16 16   Temp: 98.3 F (36.8 C) 98.1 F (36.7 C) 98.3 F (36.8 C) 98.1 F (36.7 C)  TempSrc: Oral Oral Oral Oral  SpO2: 94% 92% 100% 95%  Weight:      Height:        Intake/Output Summary (Last 24 hours) at 08/19/2019 1229 Last data filed at 08/19/2019 13/03/20 Gross per 24 hour  Intake 920 ml  Output 2000 ml  Net -1080 ml   Filed Weights   08/15/19 2108  Weight: 106.6 kg    Examination:  General: 53 y.o. female resting in bed in NAD Cardiovascular: RRR, +S1, S2, no m/g/r, equal pulses throughout Respiratory: CTABL, no w/r/r, normal WOB GI: BS+, NDNT, no masses noted, no organomegaly noted MSK: No e/c/c Neuro: A&O x 3, LUE weakness, LLE weakness is improving Psyc: Appropriate interaction and affect, calm/cooperative   Data Reviewed: I have personally reviewed following labs and imaging studies.  CBC: Recent Labs  Lab 08/15/19 2112 08/15/19 2255 08/16/19 2109  08/17/19 0245  WBC 11.0*  --  11.5* 9.4  NEUTROABS 7.8*  --   --  5.1  HGB 12.1 12.9 12.4 11.9*  HCT 38.5 38.0 39.1 37.6  MCV 86.9  --  85.9 85.6  PLT 227  --  369 628   Basic Metabolic Panel: Recent Labs  Lab 08/15/19 2255 08/15/19 2320 08/16/19 0516 08/17/19 0245  NA 137 139  --  138  K 5.0 4.0  --  4.1  CL 103 103  --  105  CO2  --  25  --  25  GLUCOSE 70 92  --  101*  BUN 27* 16  --  17  CREATININE 0.90 0.87 0.89 0.79  CALCIUM  --  9.1  --  9.0  MG  --   --   --  2.0  PHOS  --    --   --  3.6   GFR: Estimated Creatinine Clearance: 91.8 mL/min (by C-G formula based on SCr of 0.79 mg/dL). Liver Function Tests: Recent Labs  Lab 08/15/19 2320 08/17/19 0245  AST 20  --   ALT 23  --   ALKPHOS 52  --   BILITOT 0.4  --   PROT 6.2*  --   ALBUMIN 3.4* 3.4*   No results for input(s): LIPASE, AMYLASE in the last 168 hours. No results for input(s): AMMONIA in the last 168 hours. Coagulation Profile: Recent Labs  Lab 08/15/19 2320  INR 1.0   Cardiac Enzymes: No results for input(s): CKTOTAL, CKMB, CKMBINDEX, TROPONINI in the last 168 hours. BNP (last 3 results) No results for input(s): PROBNP in the last 8760 hours. HbA1C: No results for input(s): HGBA1C in the last 72 hours. CBG: Recent Labs  Lab 08/15/19 2311  GLUCAP 84   Lipid Profile: No results for input(s): CHOL, HDL, LDLCALC, TRIG, CHOLHDL, LDLDIRECT in the last 72 hours. Thyroid Function Tests: No results for input(s): TSH, T4TOTAL, FREET4, T3FREE, THYROIDAB in the last 72 hours. Anemia Panel: No results for input(s): VITAMINB12, FOLATE, FERRITIN, TIBC, IRON, RETICCTPCT in the last 72 hours. Sepsis Labs: No results for input(s): PROCALCITON, LATICACIDVEN in the last 168 hours.  Recent Results (from the past 240 hour(s))  SARS CORONAVIRUS 2 (TAT 6-24 HRS) Nasopharyngeal Nasopharyngeal Swab     Status: None   Collection Time: 08/15/19  9:40 PM   Specimen: Nasopharyngeal Swab  Result Value Ref Range Status   SARS Coronavirus 2 NEGATIVE NEGATIVE Final    Comment: (NOTE) SARS-CoV-2 target nucleic acids are NOT DETECTED. The SARS-CoV-2 RNA is generally detectable in upper and lower respiratory specimens during the acute phase of infection. Negative results do not preclude SARS-CoV-2 infection, do not rule out co-infections with other pathogens, and should not be used as the sole basis for treatment or other patient management decisions. Negative results must be combined with clinical  observations, patient history, and epidemiological information. The expected result is Negative. Fact Sheet for Patients: SugarRoll.be Fact Sheet for Healthcare Providers: https://www.woods-mathews.com/ This test is not yet approved or cleared by the [place] FDA and  has been authorized for detection and/or diagnosis of SARS-CoV-2 by FDA under an Emergency Use Authorization (EUA). This EUA will remain  in effect (meaning this test can be used) for the duration of the COVID-19 declaration under Section 56 4(b)(1) of the Act, 21 U.S.C. section 360bbb-3(b)(1), unless the authorization is terminated or revoked sooner. Performed at [facility], [place] [address], [place], [place] [address]  Radiology Studies: No results found.   Scheduled Meds: . aspirin EC  325 mg Oral Daily  . atorvastatin  40 mg Oral q1800  . buPROPion  150 mg Oral Daily  . clopidogrel  75 mg Oral Daily  . DULoxetine  60 mg Oral Daily  . enoxaparin (LOVENOX) injection  40 mg Subcutaneous Q24H  . escitalopram  10 mg Oral Daily  . lisinopril  10 mg Oral Daily  . omega-3 acid ethyl esters  1,000 mg Oral Q breakfast  . pregabalin  150 mg Oral BID  . tiZANidine  2 mg Oral QHS   Continuous Infusions:   LOS: 4 days    Time spent: 25 minutes spent in the coordination of care today.    Teddy Spikeyrone A , DO [facility] Pager [phone]  If 7PM-7AM, please contact night-coverage [email or web address] Password [email or web address] 08/19/2019, 12:29 PM

## 2019-08-19 NOTE — Progress Notes (Signed)
RN informed that [name] requested bible to be delivered to room. Also prayed with her by request. Will continue to provide spiritual care as needed.  Rev. Duchesne.

## 2019-08-19 NOTE — Progress Notes (Signed)
Physical Therapy Treatment Patient Details Name: Terri Wood MRN: [id number] DOB: 1966-03-26 Today's Date: 08/19/2019    History of Present Illness Micha Erck is a 53 y.o. female with history of hypertension, fibromyalgia, anxiety, tobacco abuse presents to the ER after patient was noticed to have left upper and lower extremity weakness. 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     PT Comments    Pt progressing well with ambulation. Pt cont to have L sided neglect, L vision deficit, impaired sequencing, and L UE flaccidity. Pt very motivated and eager for aggressive therapy program. Cont to recommend CIR Upon d/c for max functional recovery.    Follow Up Recommendations  CIR;Supervision/Assistance - 24 hour     Equipment Recommendations  Other (comment)    Recommendations for Other Services Rehab consult     Precautions / Restrictions Precautions Precautions: Fall Precaution Comments: L hemiparesis/UE flaccid Restrictions Weight Bearing Restrictions: No    Mobility  Bed Mobility               General bed mobility comments: pt up in chair upon PT arrival  Transfers Overall transfer level: Needs assistance Equipment used: None Transfers: Sit to/from Stand Sit to Stand: Min assist         General transfer comment: pt unable to use L UE funcitonally, minA to steady during transition of hand from arm rest to hemiwalker, pt requiring max verbal cues for sequencing  Ambulation/Gait Ambulation/Gait assistance: Min assist;+2 safety/equipment(2nd person for chair follow) Gait Distance (Feet): 50 Feet(x2) Assistive device: 1 person hand held assist;Hemi-walker Gait Pattern/deviations: Step-to pattern;Step-through pattern;Decreased step length - left;Decreased stance time - left Gait velocity: dec   General Gait Details: initially used hemi walker. pt with no L LE buckling but required max directional verbal cues for sequencing with hemi walker,  pt with noted decreased use of hemiwalker and transitioned to R HHA, pt instructed to push down into PTs hand during L stande phase. Pt able to have more fluid gait pattern and decresaed antalgia with R HHA, pt required seated rest break at 50 feet. pt c/o fatigue   Stairs             Wheelchair Mobility    Modified Rankin (Stroke Patients Only) Modified Rankin (Stroke Patients Only) Pre-Morbid Rankin Score: No symptoms Modified Rankin: Moderately severe disability     Balance Overall balance assessment: Needs assistance Sitting-balance support: No upper extremity supported;Feet supported Sitting balance-Leahy Scale: Good     Standing balance support: Single extremity supported Standing balance-Leahy Scale: Fair Standing balance comment: requires R HHA or holding onto hemiwalker for stability                            Cognition Arousal/Alertness: Awake/alert Behavior During Therapy: WFL for tasks assessed/performed Overall Cognitive Status: Impaired/Different from baseline Area of Impairment: Problem solving;Safety/judgement                         Safety/Judgement: Decreased awareness of deficits(Mild L sided neglect, L vision deficit)   Problem Solving: Difficulty sequencing;Requires verbal cues;Requires tactile cues General Comments: pt with difficulty tracking to the L and difficulty navigating around obstacles on the L      Exercises      General Comments General comments (skin integrity, edema, etc.): pt with difficulty tracking to the L      Pertinent Vitals/Pain Pain Assessment: No/denies pain  Home Living                      Prior Function            PT Goals (current goals can now be found in the care plan section) Progress towards PT goals: Progressing toward goals    Frequency    Min 4X/week      PT Plan Current plan remains appropriate    Co-evaluation              AM-PAC PT "6 Clicks"  Mobility   Outcome Measure  Help needed turning from your back to your side while in a flat bed without using bedrails?: A Little Help needed moving from lying on your back to sitting on the side of a flat bed without using bedrails?: A Lot Help needed moving to and from a bed to a chair (including a wheelchair)?: A Lot Help needed standing up from a chair using your arms (e.g., wheelchair or bedside chair)?: A Lot Help needed to walk in hospital room?: A Lot Help needed climbing 3-5 steps with a railing? : Total 6 Click Score: 12    End of Session Equipment Utilized During Treatment: Gait belt Activity Tolerance: Patient tolerated treatment well Patient left: in chair;with call bell/phone within reach;with chair alarm set Nurse Communication: Mobility status PT Visit Diagnosis: Unsteadiness on feet (R26.81);Hemiplegia and hemiparesis;Difficulty in walking, not elsewhere classified (R26.2) Hemiplegia - Right/Left: Left Hemiplegia - dominant/non-dominant: Non-dominant Hemiplegia - caused by: Cerebral infarction     Time: 0630-1601 PT Time Calculation (min) (ACUTE ONLY): 20 min  Charges:  $Gait Training: 8-22 mins                     Kittie Plater, PT, DPT Acute Rehabilitation Services Pager #: [phone] Office #: [phone]    Berline Lopes 08/19/2019, 11:18 AM

## 2019-08-19 NOTE — TOC Progression Note (Addendum)
Transition of Care [facility]) - Progression Note    Patient Details  Name: Terri Wood MRN: [id number] Date of Birth: 1966/01/30  Transition of Care [facility] CM/SW [place], LCSW Phone Number: 08/19/2019, 11:47 AM  Clinical Narrative:    [facility] is reviewing patient and will contact CSW back. Patient's spouse aware and that is his first choice.   **[facility] IR requesting updated OT note. OT is on the schedule to see patient today.    Expected Discharge Plan: IP Rehab Facility Barriers to Discharge: Other (comment)(Pending response from outside IP facilities)  Expected Discharge Plan and Services Expected Discharge Plan: [place] In-house Referral: Clinical Social Work Discharge Planning Services: CM Consult Post Acute Care Choice: IP Rehab Living arrangements for the past 2 months: Single Family Home                 DME Arranged: N/A                     Social Determinants of Health (SDOH) Interventions    Readmission Risk Interventions No flowsheet data found.

## 2019-08-20 LAB — RENAL FUNCTION PANEL
Albumin: 3.3 g/dL — ABNORMAL LOW (ref 3.5–5.0)
Anion gap: 11 (ref 5–15)
BUN: 20 mg/dL (ref 6–20)
CO2: 25 mmol/L (ref 22–32)
Calcium: 9 mg/dL (ref 8.9–10.3)
Chloride: 102 mmol/L (ref 98–111)
Creatinine, Ser: 0.92 mg/dL (ref 0.44–1.00)
GFR calc Af Amer: >60 mL/min (ref >60)
GFR calc non Af Amer: >60 mL/min (ref >60)
Glucose, Bld: 99 mg/dL (ref 70–99)
Phosphorus: 4.1 mg/dL (ref 2.5–4.6)
Potassium: 4.3 mmol/L (ref 3.5–5.1)
Sodium: 138 mmol/L (ref 135–145)

## 2019-08-20 LAB — CBC WITH DIFFERENTIAL/PLATELET
Abs Immature Granulocytes: 0.05 10*3/uL (ref 0.00–0.07)
Basophils Absolute: 0.1 10*3/uL (ref 0.0–0.1)
Basophils Relative: 1 %
Eosinophils Absolute: 0.1 10*3/uL (ref 0.0–0.5)
Eosinophils Relative: 1 %
HCT: 39.4 % (ref 36.0–46.0)
Hemoglobin: 12.3 g/dL (ref 12.0–15.0)
Immature Granulocytes: 1 %
Lymphocytes Relative: 32 %
Lymphs Abs: 3.1 10*3/uL (ref 0.7–4.0)
MCH: 27.2 pg (ref 26.0–34.0)
MCHC: 31.2 g/dL (ref 30.0–36.0)
MCV: 87 fL (ref 80.0–100.0)
Monocytes Absolute: 1.1 10*3/uL — ABNORMAL HIGH (ref 0.1–1.0)
Monocytes Relative: 11 %
Neutro Abs: 5.3 10*3/uL (ref 1.7–7.7)
Neutrophils Relative %: 54 %
Platelets: 336 10*3/uL (ref 150–400)
RBC: 4.53 MIL/uL (ref 3.87–5.11)
RDW: 17.4 % — ABNORMAL HIGH (ref 11.5–15.5)
WBC: 9.8 10*3/uL (ref 4.0–10.5)
nRBC: 0 % (ref 0.0–0.2)

## 2019-08-20 LAB — MAGNESIUM: Magnesium: 2.2 mg/dL (ref 1.7–2.4)

## 2019-08-20 LAB — LUPUS ANTICOAGULANT PANEL
DRVVT: 40.8 s (ref 0.0–47.0)
PTT Lupus Anticoagulant: 30.9 s (ref 0.0–51.9)

## 2019-08-20 LAB — ANTINUCLEAR ANTIBODIES, IFA: ANA Ab, IFA: NEGATIVE

## 2019-08-20 LAB — SARS CORONAVIRUS 2 (TAT 6-24 HRS): SARS Coronavirus 2: NEGATIVE

## 2019-08-20 NOTE — Progress Notes (Signed)
Physical Therapy Treatment Patient Details Name: Terri Wood MRN: [id number] DOB: 03/22/1966 Today's Date: 08/20/2019    History of Present Illness Terri Wood is a 53 y.o. female with history of hypertension, fibromyalgia, anxiety, tobacco abuse presents to the ER after patient was noticed to have left upper and lower extremity weakness. 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     PT Comments    Pt making excellent progress towards physical therapy goals, remaining very motivated to participate. Session focused on gait training and progression of activity tolerance.  Ambulating 60 feet x 2 with quad cane at a min assist level. Continues with left upper extremity hemiparesis, gait abnormalities, balance impairments. Continue to recommend comprehensive inpatient rehab (CIR) for post-acute therapy needs.    Follow Up Recommendations  CIR;Supervision/Assistance - 24 hour     Equipment Recommendations  Other (comment)(quad cane)    Recommendations for Other Services       Precautions / Restrictions Precautions Precautions: Fall Precaution Comments: L hemiparesis/UE flaccid Restrictions Weight Bearing Restrictions: No    Mobility  Bed Mobility Overal bed mobility: Needs Assistance Bed Mobility: Supine to Sit     Supine to sit: Supervision     General bed mobility comments: OOB in chair  Transfers Overall transfer level: Needs assistance Equipment used: Quad cane Transfers: Sit to/from Stand Sit to Stand: Min guard         General transfer comment: Cues for hand placement, increased time  Ambulation/Gait Ambulation/Gait assistance: Min assist;+2 safety/equipment Gait Distance (Feet): 60 Feet(60, 60) Assistive device: Quad cane Gait Pattern/deviations: Step-to pattern;Step-through pattern;Decreased step length - left;Decreased stance time - left Gait velocity: decreased Gait velocity interpretation: <1.8 ft/sec, indicate of risk for recurrent  falls General Gait Details: Cues for quad cane sequencing, pointing left toes straight forward, activity pacing. Pt with noted decreased left pelvic rotation and moderate reliance through single UE support   Stairs             Wheelchair Mobility    Modified Rankin (Stroke Patients Only)       Balance Overall balance assessment: Needs assistance Sitting-balance support: No upper extremity supported;Feet supported Sitting balance-Leahy Scale: Good Sitting balance - Comments: Pt able to tolerate weight shifting to don socks sitting EOB   Standing balance support: Single extremity supported Standing balance-Leahy Scale: Fair Standing balance comment: Pt able to maintain balance for short period of time standing at sink for oral care                            Cognition Arousal/Alertness: Awake/alert Behavior During Therapy: WFL for tasks assessed/performed Overall Cognitive Status: Impaired/Different from baseline Area of Impairment: Safety/judgement;Awareness;Problem solving                         Safety/Judgement: Decreased awareness of safety Awareness: Emergent Problem Solving: Slow processing;Difficulty sequencing;Requires verbal cues;Requires tactile cues General Comments: Pt demonstrating difficulty anticipating problems and figuring out how to solve the problems. Pt required cues to attend to R side to notice chair in front of her and to move around the chair. Pt cued to walk to sink, pt walked past sink and required cues to stop and turn around to locate the sink. Pt required cues to locate oral care items on L side of sink.       Exercises General Exercises - Upper Extremity Shoulder Flexion: Self ROM;5 reps;Left;Seated Elbow Flexion: 10  reps;PROM;Seated Elbow Extension: PROM;Left;10 reps;Seated    General Comments General comments (skin integrity, edema, etc.): Pt husband present during session      Pertinent Vitals/Pain Pain  Assessment: Faces Faces Pain Scale: Hurts little more Pain Location: left shoulder Pain Descriptors / Indicators: Aching Pain Intervention(s): Monitored during session    Home Living                      Prior Function            PT Goals (current goals can now be found in the care plan section) Acute Rehab PT Goals Patient Stated Goal: to get control of Lt UE  Potential to Achieve Goals: Good Progress towards PT goals: Not progressing toward goals - comment    Frequency    Min 4X/week      PT Plan Current plan remains appropriate    Co-evaluation              AM-PAC PT "6 Clicks" Mobility   Outcome Measure  Help needed turning from your back to your side while in a flat bed without using bedrails?: A Little Help needed moving from lying on your back to sitting on the side of a flat bed without using bedrails?: A Little Help needed moving to and from a bed to a chair (including a wheelchair)?: A Little Help needed standing up from a chair using your arms (e.g., wheelchair or bedside chair)?: A Little Help needed to walk in hospital room?: A Little Help needed climbing 3-5 steps with a railing? : A Lot 6 Click Score: 17    End of Session Equipment Utilized During Treatment: Gait belt Activity Tolerance: Patient tolerated treatment well Patient left: in chair;with call bell/phone within reach;with family/visitor present Nurse Communication: Mobility status PT Visit Diagnosis: Unsteadiness on feet (R26.81);Hemiplegia and hemiparesis;Difficulty in walking, not elsewhere classified (R26.2) Hemiplegia - Right/Left: Left Hemiplegia - dominant/non-dominant: Non-dominant Hemiplegia - caused by: Cerebral infarction     Time: 1025-8527 PT Time Calculation (min) (ACUTE ONLY): 22 min  Charges:  $Gait Training: 8-22 mins                     Ellamae Sia, PT, DPT Acute Rehabilitation Services Pager [phone] Office [phone]    Willy Eddy 08/20/2019, 1:10 PM

## 2019-08-20 NOTE — Progress Notes (Signed)
Occupational Therapy Treatment Patient Details Name: Terri Wood MRN: [id number] DOB: 08/03/1966 Today's Date: 08/20/2019    History of present illness Terri Lanamela Windhorst is a 53 y.o. female with history of hypertension, fibromyalgia, anxiety, tobacco abuse presents to the ER after patient was noticed to have left upper and lower extremity weakness. 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    OT comments  Pt making progress towards OT goals. Pt continues to present with L inattention, flaccid LUE, decreased balance, awareness, and problem solving. Pt very eager to participate in session today. Provided education regarding one handed LB dressing technique, and WB through LUE. During functional mobility, pt demonstrated L inattention and was hugging R wall, and required max VCs to look to L and problem solve how to get around objects on the R side. During oral care, pt was challenged to locate items on L side of sink, pt began to incorporate strategies discussed to attend to L side. Faciliated WB through LUE during oral care at sink. Continue to recommend dc to CIR to address deficits and increase safe performance of ADLs. Will follow acutely as admitted.    Follow Up Recommendations  CIR;Supervision/Assistance - 24 hour    Equipment Recommendations  3 in 1 bedside commode    Recommendations for Other Services Rehab consult    Precautions / Restrictions Precautions Precautions: Fall Precaution Comments: L hemiparesis/UE flaccid Restrictions Weight Bearing Restrictions: No       Mobility Bed Mobility Overal bed mobility: Needs Assistance Bed Mobility: Supine to Sit     Supine to sit: Supervision     General bed mobility comments: Pt able to come to sitting EOB with supervision for safety  Transfers Overall transfer level: Needs assistance Equipment used: None Transfers: Sit to/from Stand Sit to Stand: Min guard         General transfer comment: Pt able to  perform sit<>stand without assist, required min guard A for safety of LUE and stability    Balance Overall balance assessment: Needs assistance Sitting-balance support: No upper extremity supported;Feet supported Sitting balance-Leahy Scale: Good Sitting balance - Comments: Pt able to tolerate weight shifting to don socks sitting EOB   Standing balance support: Single extremity supported Standing balance-Leahy Scale: Fair Standing balance comment: Pt able to maintain balance for short period of time standing at sink for oral care                           ADL either performed or assessed with clinical judgement   ADL Overall ADL's : Needs assistance/impaired     Grooming: Oral care;Cueing for sequencing;Cueing for compensatory techniques;Standing;Minimal assistance Grooming Details (indicate cue type and reason): Pt required min cues to locate items for oral care on L side of sink. Required min A to use LUE to stabilize grooming items such as mouth wash to open the bottle.              Lower Body Dressing: Minimal assistance;Cueing for compensatory techniques;Sit to/from stand Lower Body Dressing Details (indicate cue type and reason): Provided education regarding compensatory method for donning socks with one hand. Pt able to don R sock with min guard A, required min A to don L sock sitting EOB. Toilet Transfer: Ambulation;Minimal assistance(simulated to recliner) StatisticianToilet Transfer Details (indicate cue type and reason): Pt required min A for balance and stability         Functional mobility during ADLs: Minimal assistance General  ADL Comments: Pt required min A for oral care, LB dressing, and functional mobility. Required VCs to problem solve throughout session     Vision   Vision Assessment?: Yes Eye Alignment: Within Functional Limits Visual Fields: Left homonymous hemianopsia Additional Comments: Pt required VCs to scan to L visual field to locate items    Perception     Praxis      Cognition Arousal/Alertness: Awake/alert Behavior During Therapy: WFL for tasks assessed/performed Overall Cognitive Status: Impaired/Different from baseline Area of Impairment: Safety/judgement;Awareness;Problem solving                         Safety/Judgement: Decreased awareness of safety Awareness: Emergent Problem Solving: Slow processing;Difficulty sequencing;Requires verbal cues;Requires tactile cues General Comments: Pt demonstrating difficulty anticipating problems and figuring out how to solve the problems. Pt required cues to attend to R side to notice chair in front of her and to move around the chair. Pt cued to walk to sink, pt walked past sink and required cues to stop and turn around to locate the sink. Pt required cues to locate oral care items on L side of sink.         Exercises Exercises: General Upper Extremity General Exercises - Upper Extremity Shoulder Flexion: Self ROM;5 reps;Left;Seated Elbow Flexion: 10 reps;PROM;Seated Elbow Extension: PROM;Left;10 reps;Seated   Shoulder Instructions       General Comments Pt husband present during session    Pertinent Vitals/ Pain       Pain Assessment: No/denies pain Pain Intervention(s): Monitored during session;Repositioned  Home Living                                          Prior Functioning/Environment              Frequency  Min 2X/week        Progress Toward Goals  OT Goals(current goals can now be found in the care plan section)  Progress towards OT goals: Progressing toward goals  Acute Rehab OT Goals Patient Stated Goal: to get control of Lt UE  OT Goal Formulation: With patient Time For Goal Achievement: 08/30/19 Potential to Achieve Goals: Good ADL Goals Pt Will Perform Eating: with modified independence;sitting Pt Will Perform Grooming: with min assist;standing Pt Will Perform Upper Body Bathing: with min  assist;sitting Pt Will Perform Lower Body Bathing: with mod assist;sit to/from stand Pt Will Perform Upper Body Dressing: with min assist;sitting Pt Will Perform Lower Body Dressing: with mod assist;sit to/from stand Pt Will Transfer to Toilet: with min assist;ambulating;regular height toilet;bedside commode;grab bars Pt Will Perform Toileting - Clothing Manipulation and hygiene: with min assist;sit to/from stand Additional ADL Goal #1: Pt will use Lt UE as a stabilizer during ADLs  Plan Discharge plan remains appropriate;Frequency remains appropriate    Co-evaluation                 AM-PAC OT "6 Clicks" Daily Activity     Outcome Measure   Help from another person eating meals?: None Help from another person taking care of personal grooming?: A Little Help from another person toileting, which includes using toliet, bedpan, or urinal?: A Little Help from another person bathing (including washing, rinsing, drying)?: A Lot Help from another person to put on and taking off regular upper body clothing?: A Lot Help from another person to put on and  taking off regular lower body clothing?: A Lot 6 Click Score: 16    End of Session Equipment Utilized During Treatment: Gait belt  OT Visit Diagnosis: Unsteadiness on feet (R26.81);Hemiplegia and hemiparesis Hemiplegia - Right/Left: Left Hemiplegia - dominant/non-dominant: Non-Dominant Hemiplegia - caused by: Cerebral infarction   Activity Tolerance Patient tolerated treatment well   Patient Left in chair;with call bell/phone within reach;with family/visitor present   Nurse Communication Mobility status        Time: 1030-1052 OT Time Calculation (min): 22 min  Charges: OT General Charges $OT Visit: 1 Visit OT Treatments $Self Care/Home Management : 8-22 mins  Gus Rankin, OT Student  Di Kindle  08/20/2019, 11:51 AM

## 2019-08-20 NOTE — Progress Notes (Signed)
PROGRESS NOTE    Terri Wood  YQI:[id number] DOB: [birth year] DOA: 08/15/2019 PCP: Leotis Shames, MD     Brief Narrative:  Terri Wood is a 53 y.o.femalewithhistory of hypertension, fibromyalgia, anxiety, tobacco abuse presents to the ER after patient was noticed to have left upper and lower extremity weakness. Patient states her weakness started around 2 PM yesterday, which happened suddenly. At around 7 PM when she was trying to walk she fell and EMS was called. EMS noticed the weakness and was brought to the ER. Denies any difficulty speaking swallowing.Patient was recently placed on antibiotics and prednisone for sinusitis. Found to have acute right MCA CVA. Now awaiting CIR placement.   New events last 24 hours / Subjective: Continues to have left upper extremity weakness, worked with physical therapy and was pleased with her ambulation progress.  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   Weight as of this encounter: 106.6 kg.  Fibromyalgia - Continue cymbalta, lyrica, zanaflex, ultram  Reactive RPR titre  - Previous hospitalist spoke with ID; likely previously treated or false negative; total ab non-reactive    DVT prophylaxis: Lovenox  Code Status: Full Family Communication: None at bedside, husband on speaker during my exam Disposition Plan: CIR auth pending   Consultants:   Neurology  Procedures:   None   Antimicrobials:  Anti-infectives (From admission, onward)   None        Objective: Vitals:   08/19/19 2043 08/20/19 0009 08/20/19 0455 08/20/19 0925  BP: 111/61 104/62 123/71 (!) 96/57  Pulse: 87 76 76 77  Resp: 18 18 18 20   Temp: 99.2 F (37.3 C) 98 F (36.7 C) 97.7 F (36.5 C) 98 F (36.7 C)  TempSrc: Oral Oral Oral Oral  SpO2: 96% 93% 97% 100%  Weight:      Height:        Intake/Output Summary (Last 24 hours) at 08/20/2019 1401 Last data filed at 08/20/2019 1152 Gross per 24 hour  Intake 480 ml  Output 4900 ml  Net -4420 ml   Filed Weights   08/15/19 2108  Weight: 106.6 kg    Examination:  General exam: Appears calm and comfortable  Respiratory system: Clear to auscultation. Respiratory effort normal. No respiratory distress. No conversational dyspnea.  Cardiovascular system: S1 & S2 heard, RRR. No murmurs. No pedal edema. Gastrointestinal system: Abdomen is nondistended, soft and nontender. Normal bowel sounds heard. Central nervous system: Alert and oriented.  Left upper extremity weakness Extremities: Symmetric in appearance  Skin: No rashes, lesions or ulcers on exposed skin  Psychiatry: Judgement and insight appear  normal. Mood & affect appropriate.   Data Reviewed: I have personally reviewed following labs and imaging studies  CBC: Recent Labs  Lab 08/15/19 2112 08/15/19 2255 08/16/19 0516 08/17/19 0245 08/20/19 0217  WBC 11.0*  --  11.5* 9.4 9.8  NEUTROABS 7.8*  --   --  5.1 5.3  HGB 12.1 12.9 12.4 11.9* 12.3  HCT 38.5 38.0 39.1 37.6 39.4  MCV 86.9  --  85.9 85.6 87.0  PLT 227  --  369 314 336   Basic Metabolic Panel:  Recent Labs  Lab 08/15/19 2255 08/15/19 2320 08/16/19 0516 08/17/19 0245 08/20/19 0217  NA 137 139  --  138 138  K 5.0 4.0  --  4.1 4.3  CL 103 103  --  105 102  CO2  --  25  --  25 25  GLUCOSE 70 92  --  101* 99  BUN 27* 16  --  17 20  CREATININE 0.90 0.87 0.89 0.79 0.92  CALCIUM  --  9.1  --  9.0 9.0  MG  --   --   --  2.0 2.2  PHOS  --   --   --  3.6 4.1   GFR: Estimated Creatinine Clearance: 79.8 mL/min (by C-G formula based on SCr of 0.92 mg/dL). Liver Function Tests: Recent Labs  Lab 08/15/19 2320 08/17/19 0245 08/20/19 0217  AST 20  --   --   ALT 23  --   --   ALKPHOS 52  --   --   BILITOT 0.4  --   --   PROT 6.2*  --   --   ALBUMIN 3.4* 3.4* 3.3*   No results for input(s): LIPASE, AMYLASE in the last 168 hours. No results for input(s): AMMONIA in the last 168 hours. Coagulation Profile: Recent Labs  Lab 08/15/19 2320  INR 1.0   Cardiac Enzymes: No results for input(s): CKTOTAL, CKMB, CKMBINDEX, TROPONINI in the last 168 hours. BNP (last 3 results) No results for input(s): PROBNP in the last 8760 hours. HbA1C: No results for input(s): HGBA1C in the last 72 hours. CBG: Recent Labs  Lab 08/15/19 2311  GLUCAP 84   Lipid Profile: No results for input(s): CHOL, HDL, LDLCALC, TRIG, CHOLHDL, LDLDIRECT in the last 72 hours. Thyroid Function Tests: No results for input(s): TSH, T4TOTAL, FREET4, T3FREE, THYROIDAB in the last 72 hours. Anemia Panel: No results for input(s): VITAMINB12, FOLATE, FERRITIN, TIBC, IRON, RETICCTPCT in the last 72 hours. Sepsis Labs: No results for input(s): PROCALCITON, LATICACIDVEN in the last 168 hours.  Recent Results (from the past 240 hour(s))  SARS CORONAVIRUS 2 (TAT 6-24 HRS) Nasopharyngeal Nasopharyngeal Swab     Status: None   Collection Time: 08/15/19  9:40 PM   Specimen: Nasopharyngeal Swab  Result Value Ref Range Status   SARS Coronavirus 2 NEGATIVE NEGATIVE Final    Comment: (NOTE) SARS-CoV-2 target nucleic  acids are NOT DETECTED. The SARS-CoV-2 RNA is generally detectable in upper and lower respiratory specimens during the acute phase of infection. Negative results do not preclude SARS-CoV-2 infection, do not rule out co-infections with other pathogens, and should not be used as the sole basis for treatment or other patient management decisions. Negative results must be combined with clinical observations, patient history, and epidemiological information. The expected result is Negative. Fact Sheet for Patients: HairSlick.nohttps://www.fda.gov/media/138098/download Fact Sheet for Healthcare Providers: quierodirigir.comhttps://www.fda.gov/media/138095/download This test is not yet approved or cleared by the [place] FDA and  has been authorized for detection  and/or diagnosis of SARS-CoV-2 by FDA under an Emergency Use Authorization (EUA). This EUA will remain  in effect (meaning this test can be used) for the duration of the COVID-19 declaration under Section 56 4(b)(1) of the Act, 21 U.S.C. section 360bbb-3(b)(1), unless the authorization is terminated or revoked sooner. Performed at [facility], [place] [address] [place], [place] [address]       Radiology Studies: No results found.    Scheduled Meds: . aspirin EC  325 mg Oral Daily  . atorvastatin  40 mg Oral q1800  . buPROPion  150 mg Oral Daily  . clopidogrel  75 mg Oral Daily  . DULoxetine  60 mg Oral Daily  . enoxaparin (LOVENOX) injection  40 mg Subcutaneous Q24H  . escitalopram  10 mg Oral Daily  . lisinopril  10 mg Oral Daily  . omega-3 acid ethyl esters  1,000 mg Oral Q breakfast  . pregabalin  150 mg Oral BID  . tiZANidine  2 mg Oral QHS   Continuous Infusions:   LOS: 5 days      Time spent: 35 minutes   Dessa Phi, DO [facility] 08/20/2019, 2:01 PM   Available via Epic secure chat 7am-7pm After these hours, please refer to coverage provider listed on [email or web address]

## 2019-08-20 NOTE — TOC Progression Note (Signed)
Transition of Care [facility]) - Progression Note    Patient Details  Name: Terri Wood MRN: [id number] Date of Birth: 1966/02/26  [facility]) CM/SW [place], LCSW Phone Number: 08/20/2019, 1:09 PM  Clinical Narrative:    CSW let [facility] IR know that OT has seen patient. Awaiting approval.    Expected Discharge Plan: IP Rehab Facility Barriers to Discharge: Other (comment)(Pending response from outside IP facilities)  Expected Discharge Plan and Services Expected Discharge Plan: [place] In-house Referral: Clinical Social Work Discharge Planning Services: CM Consult Post Acute Care Choice: IP Rehab Living arrangements for the past 2 months: Single Family Home                 DME Arranged: N/A                     Social Determinants of Health (SDOH) Interventions    Readmission Risk Interventions No flowsheet data found.

## 2019-08-21 LAB — PROTHROMBIN GENE MUTATION

## 2019-08-21 MED ORDER — ATORVASTATIN CALCIUM 40 MG PO TABS: 40.0000 mg | ORAL_TABLET | Freq: Every day | ORAL | 2 refills | Status: DC

## 2019-08-21 MED ORDER — CLOPIDOGREL BISULFATE 75 MG PO TABS: 75.0000 mg | ORAL_TABLET | Freq: Every day | ORAL | 2 refills | Status: DC

## 2019-08-21 MED ORDER — ASPIRIN 325 MG PO TBEC: 325.0000 mg | DELAYED_RELEASE_TABLET | Freq: Every day | ORAL | 2 refills | Status: DC

## 2019-08-21 MED ORDER — LISINOPRIL 10 MG PO TABS: 10.0000 mg | ORAL_TABLET | Freq: Every day | ORAL | 2 refills | Status: DC

## 2019-08-21 NOTE — TOC Transition Note (Signed)
Transition of Care [facility]) - CM/SW Discharge Note   Patient Details  Name: Terri Wood MRN: [id number] Date of Birth: 1966-08-23  [facility] CM/SW Contact:  Benard Halsted, LCSW Phone Number: 08/21/2019, 12:57 PM   Clinical Narrative:    Festus Aloe has received insurance approval for discharge.   Patient will DC to: [facility] Anticipated DC date: 08/21/19 Family notified: Spouse Transport by: Corey Harold   Per MD patient ready for DC to [facility] IR. RN, patient, patient's family, and facility notified of DC. Discharge Summary and FL2 sent to facility. RN to call report prior to discharge [phone]). DC packet on chart. Ambulance transport requested for patient.   CSW will sign off for now as social work intervention is no longer needed. Please consult [place] again if new needs arise.  Cedric Fishman, LCSW Clinical Social Worker [phone]      Barriers to Discharge: No Barriers Identified   Patient Goals and CMS Choice Patient states their goals for this hospitalization and ongoing recovery are:: Return home CMS Medicare.gov Compare Post Acute Care list provided to:: Patient Choice offered to / list presented to : Patient  Discharge Placement                Patient to be transferred to facility by: [place] Name of family member notified: Spouse Patient and family notified of of transfer: 08/21/19  Discharge Plan and Services In-house Referral: Clinical Social Work Discharge Planning Services: CM Consult Post Acute Care Choice: IP Rehab          DME Arranged: N/A         HH Arranged: NA [place] Agency: NA        Social Determinants of Health (SDOH) Interventions     Readmission Risk Interventions No flowsheet data found.

## 2019-08-21 NOTE — TOC Progression Note (Signed)
Transition of Care [facility]) - Progression Note    Patient Details  Name: Terri Wood MRN: [id number] Date of Birth: 04-17-1966  Transition of Care [facility]) CM/SW [place], LCSW Phone Number: 08/21/2019, 10:35 AM  Clinical Narrative:    Per [facility] In[facility] re[facility], they are hoping to hear back from insurance by 12:30pm. They are requesting a 1pm PTAR pickup as they will be unable to accept patient after that. They will notify CSW if they have not heard back so hospital can cancel transport.    Expected Discharge Plan: IP Rehab Facility Barriers to Discharge: Other (comment)(Pending response from outside IP facilities)  Expected Discharge Plan and Services Expected Discharge Plan: [place] In-house Referral: Clinical Social Work Discharge Planning Services: CM Consult Post Acute Care Choice: IP Rehab Living arrangements for the past 2 months: Single Family Home                 DME Arranged: N/A                     Social Determinants of Health (SDOH) Interventions    Readmission Risk Interventions No flowsheet data found.

## 2019-08-21 NOTE — Discharge Summary (Signed)
Physician Discharge Summary  Terri Wood WNU:[id number] DOB: 11-09-1965 DOA: 08/15/2019  PCP: Leotis Shames, MD  Admit date: 08/15/2019 Discharge date: 08/21/2019  Admitted From: Home Disposition:  [facility] inpatient rehab   Recommendations for Outpatient Follow-up:  1. Follow up with PCP in 1 week 2. Follow up with Neurology in 4 weeks, outpatient referral placed at time of discharge   Discharge Condition: Stable CODE STATUS: Full  Diet recommendation: Heart healthy   Brief/Interim Summary: Terri Wood is a 53 y.o.femalewithhistory of hypertension, fibromyalgia, anxiety, tobacco abuse presents to the ER after patient was noticed to have left upper and lower extremity weakness. Patient states her weakness started around 2 PM yesterday, which happened suddenly. At around 7 PM when she was trying to walk she fell and EMS was called. EMS noticed the weakness and was brought to the ER. Denies any difficulty speaking swallowing.Patient was recently placed on antibiotics and prednisone for sinusitis. Found to have acute right MCA CVA and neurology consulted.   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   Weight as of this encounter: 106.6 kg.  Fibromyalgia - Continue cymbalta, lyrica, zanaflex, ultram  Reactive RPR titre - Previous hospitalist spoke with ID; likely previously treated or false negative; total abnon-reactive    Discharge Instructions  Discharge Instructions    Ambulatory referral to Neurology   Complete by: As directed    Follow up with stroke clinic NP (Jessica Vanschaick or , if both not available, consider , or [facility]) at [facility] in about 4 weeks. Thanks.   Diet - low sodium heart healthy   Complete by: As directed    Increase activity slowly   Complete by: As directed      Allergies as of 08/21/2019      Reactions   Penicillins Hives   Did it involve swelling of the face/tongue/throat, SOB, or low BP? Yes Did it involve sudden or severe rash/hives, skin peeling, or any reaction on the inside of your mouth or nose? No Did you need to seek medical attention at a hospital or doctor's office? No When did it last happen? Within the past 10 years If all above answers are "NO", may proceed with cephalosporin use.      Medication List    STOP taking these medications   amLODipine 10 MG tablet Commonly known as: NORVASC   diclofenac 75 MG EC tablet Commonly known as: VOLTAREN   enoxaparin 40 MG/0.4ML injection Commonly known as: LOVENOX   HYDROcodone-acetaminophen 5-325 MG tablet Commonly known as: NORCO/VICODIN   levofloxacin 250 MG tablet Commonly known as: LEVAQUIN   lisinopril-hydrochlorothiazide 20-25 MG  tablet Commonly known as: ZESTORETIC   predniSONE 20 MG tablet Commonly known as: DELTASONE     TAKE these medications   Apple Cider Vinegar Plus Tabs Take 1 tablet by mouth 2 (two) times daily.   aspirin 325 MG EC tablet Take 1 tablet (325 mg total) by mouth daily.   atorvastatin 40 MG tablet Commonly known as: LIPITOR Take 1 tablet (40 mg total) by mouth daily at 6  PM.   baclofen 10 MG tablet Commonly known as: LIORESAL Take 10 mg by mouth 2 (two) times daily as needed for muscle spasms.   buPROPion 150 MG 24 hr tablet Commonly known as: WELLBUTRIN XL Take 150 mg by mouth daily.   CALTRATE 600+D PLUS PO Take 1 tablet by mouth daily.   clonazePAM 0.5 MG tablet Commonly known as: KLONOPIN Take 0.25-0.5 tablets by mouth 3 (three) times daily as needed for anxiety (or sleep).   clopidogrel 75 MG tablet Commonly known as: PLAVIX Take 1 tablet (75 mg total) by mouth daily.   DULoxetine 60 MG capsule Commonly known as: CYMBALTA Take 60 mg by mouth daily.   escitalopram 10 MG tablet Commonly known as: LEXAPRO Take 10 mg by mouth daily.   fluticasone 50 MCG/ACT nasal spray Commonly known as: FLONASE Place 1 spray into both nostrils 2 (two) times daily as needed for allergies or rhinitis.   lisinopril 10 MG tablet Commonly known as: ZESTRIL Take 1 tablet (10 mg total) by mouth daily.   MULTI FOR HER 50+ PO Take 1 tablet by mouth daily.   Omega 3 1000 MG Caps Take 1,000 mg by mouth daily with breakfast.   pregabalin 150 MG capsule Commonly known as: LYRICA Take 150 mg by mouth 2 (two) times daily.   tiZANidine 4 MG tablet Commonly known as: ZANAFLEX Take 2-4 mg by mouth See admin instructions. Take 2 mg by mouth at bedtime and an additional 2-4 mg once a day as needed for spasms   traMADol 50 MG tablet Commonly known as: ULTRAM Take 50 mg by mouth 2 (two) times daily.      Follow-up Information    [facility]. Schedule an appointment as soon as possible for a visit in 4 week(s).   Specialty: Neurology Contact information: [address] [place] [id number] [phone]       Leotis ShamesSingh, Jasmine, MD. Schedule an appointment as soon as possible for a visit in 1 week(s).   Specialty: Internal Medicine Contact information: [address] [facility] [place]  [id number] [phone]          Allergies  Allergen Reactions  . Penicillins Hives    Did it involve swelling of the face/tongue/throat, SOB, or low BP? Yes Did it involve sudden or severe rash/hives, skin peeling, or any reaction on the inside of your mouth or nose? No Did you need to seek medical attention at a hospital or doctor's office? No When did it last happen? Within the past 10 years If all above answers are "NO", may proceed with cephalosporin use.     Consultations:  Neurology   Procedures/Studies: Ct Angio Head W Or Wo Contrast  Result Date: 08/15/2019 CLINICAL DATA:  Code stroke. Neuro deficit for greater than 6 hours. Left-sided weakness. Last seen normal 7.5 hours ago. EXAM: CT ANGIOGRAPHY HEAD AND NECK TECHNIQUE: Multidetector CT imaging of the head and neck was performed using the standard protocol during bolus administration of intravenous contrast. Multiplanar CT image reconstructions  and MIPs were obtained to evaluate the vascular anatomy. Carotid stenosis measurements (when applicable) are obtained utilizing NASCET criteria, using the distal internal carotid diameter as the denominator. CONTRAST:  OMNIPAQUE IOHEXOL 350 MG/ML SOLN COMPARISON:  CT head without contrast 08/15/2019 FINDINGS: CTA NECK FINDINGS Aortic arch: A 3 vessel arch configuration is present. Minimal distal atherosclerotic changes are present. There is no significant stenosis or aneurysm. Right carotid system: The right common carotid artery is within normal limits. The right internal carotid artery is occluded at the bifurcation. There is no reconstitution in the neck. Left carotid system: The left common carotid artery is within normal limits. Mild atherosclerotic changes are noted at the bifurcation. There is some tortuosity of the cervical left ICA without a significant stenosis. Vertebral arteries: The left vertebral artery is slightly dominant to the right. Both vertebral arteries originate  from the subclavian arteries without significant stenosis. There is no significant stenosis of either vertebral artery in the neck. 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 Vertebral body heights and alignment are maintained. No focal lytic or blastic lesions are present. Other neck: The soft tissues the neck are unremarkable. No focal mucosal or submucosal lesions are present. Salivary glands are within normal limits. No significant adenopathy is present. Thyroid is normal. Upper chest: Lung apices demonstrate mild diffuse ground-glass attenuation. No focal nodule, mass, or airspace disease present. The thoracic inlet is normal. Review of the MIP images confirms the above findings CTA HEAD FINDINGS Anterior circulation: The right internal carotid artery is not reconstituted significantly below the ICA termini. There is minimal filling of the supraclinoid right ICA. The anterior communicating artery is patent. Study is mildly degraded by patient motion and skull base. The left internal carotid artery is unremarkable through the ICA terminus. The A1 and M1 segments are normal. Motion artifact obscures portions of the left M1 segment. Bifurcations are normal. There is attenuation of distal branch vessels on the right consistent with the infarct territory. No significant proximal anterior occlusion is present. There is some attenuation of distal ACA branches without a significant proximal stenosis or occlusion. Posterior circulation: The left vertebral artery is slightly dominant. Vertebrobasilar junction is normal. Basilar artery is normal. Both posterior cerebral arteries originate from the basilar tip. The PCA branch vessels are within normal limits. Venous sinuses: The dural sinuses are patent. Anatomic variants: None Review of the MIP images confirms the above findings IMPRESSION: 1. 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  2. Marked attenuation of distal posterior right MCA branches compatible with the more remote infarct. 3. No significant anterior stenosis or occlusion. 4. Left MCA branches are normal. 5. Posterior circulation is unremarkable. Electronically Signed   By: Marin Roberts M.D.   On: 08/15/2019 21:43   Ct Angio Neck W Or Wo Contrast  Result Date: 08/15/2019 CLINICAL DATA:  Code stroke. Neuro deficit for greater than 6 hours. Left-sided weakness. Last seen normal 7.5 hours ago. EXAM: CT ANGIOGRAPHY HEAD AND NECK TECHNIQUE: Multidetector CT imaging of the head and neck was performed using the standard protocol during bolus administration of intravenous contrast. Multiplanar CT image reconstructions and MIPs were obtained to evaluate the vascular anatomy. Carotid stenosis measurements (when applicable) are obtained utilizing NASCET criteria, using the distal internal carotid diameter as the denominator. CONTRAST:  OMNIPAQUE IOHEXOL 350 MG/ML SOLN COMPARISON:  CT head without contrast 08/15/2019 FINDINGS: CTA NECK FINDINGS Aortic arch: A 3 vessel arch  configuration is present. Minimal distal atherosclerotic changes are present. There is no significant stenosis or aneurysm. Right carotid system: The right common carotid artery is within normal limits. The right internal carotid artery is occluded at the bifurcation. There is no reconstitution in the neck. Left carotid system: The left common carotid artery is within normal limits. Mild atherosclerotic changes are noted at the bifurcation. There is some tortuosity of the cervical left ICA without a significant stenosis. Vertebral arteries: The left vertebral artery is slightly dominant to the right. Both vertebral arteries originate from the subclavian arteries without significant stenosis. There is no significant stenosis of either vertebral artery in the neck. 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 Vertebral body heights and alignment are maintained.  No focal lytic or blastic lesions are present. Other neck: The soft tissues the neck are unremarkable. No focal mucosal or submucosal lesions are present. Salivary glands are within normal limits. No significant adenopathy is present. Thyroid is normal. Upper chest: Lung apices demonstrate mild diffuse ground-glass attenuation. No focal nodule, mass, or airspace disease present. The thoracic inlet is normal. Review of the MIP images confirms the above findings CTA HEAD FINDINGS Anterior circulation: The right internal carotid artery is not reconstituted significantly below the ICA termini. There is minimal filling of the supraclinoid right ICA. The anterior communicating artery is patent. Study is mildly degraded by patient motion and skull base. The left internal carotid artery is unremarkable through the ICA terminus. The A1 and M1 segments are normal. Motion artifact obscures portions of the left M1 segment. Bifurcations are normal. There is attenuation of distal branch vessels on the right consistent with the infarct territory. No significant proximal anterior occlusion is present. There is some attenuation of distal ACA branches without a significant proximal stenosis or occlusion. Posterior circulation: The left vertebral artery is slightly dominant. Vertebrobasilar junction is normal. Basilar artery is normal. Both posterior cerebral arteries originate from the basilar tip. The PCA branch vessels are within normal limits. Venous sinuses: The dural sinuses are patent. Anatomic variants: None Review of the MIP images confirms the above findings IMPRESSION: 1. 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 2. Marked attenuation of distal posterior right MCA branches compatible with the more remote infarct. 3. No significant anterior stenosis or occlusion. 4. Left MCA branches are normal. 5. Posterior circulation is unremarkable.  Electronically Signed   By: Marin Roberts M.D.   On: 08/15/2019 21:43   Mr Brain Wo Contrast  Result Date: 08/16/2019 CLINICAL DATA:  Acute onset of left upper and lower extremity weakness beginning at 2 p.m. yesterday. EXAM: MRI HEAD WITHOUT CONTRAST TECHNIQUE: Multiplanar, multiecho pulse sequences of the brain and surrounding structures were obtained without intravenous contrast. COMPARISON:  CT head and CTA head and neck 08/15/2019 FINDINGS: Brain: The diffusion-weighted images confirm confluent nonhemorrhagic cortical infarct involving the anterior superior right frontal lobe. There is involvement of the middle and inferior right frontal gyrus. Additional punctate white matter infarcts are present along the associated cortical spinal tracts. Acute nonhemorrhagic infarcts also involves the right caudate head, anterior limb of the right internal capsule, and anterior right lentiform nucleus. The previous posterior right MCA infarct is redemonstrated. T2 hyperintensities associated with the areas of acute/subacute infarction. There is volume loss in the more remote posterior right MCA territory infarcts. Mild periventricular and subcortical T2 hyperintensities are present in the left hemisphere. Ventricles are otherwise normal in size. White matter  changes extend into the brainstem. The cerebellum is normal. Vascular: Right ICA occlusion is again noted. There is no flow in the right MCA and ACA. There is chronic attenuation of posterior right MCA branches. No significant proximal occlusion is present. Flow is present in the posterior circulation. Skull and upper cervical spine: The craniocervical junction is normal. Upper cervical spine is within normal limits. Marrow signal is unremarkable. Sinuses/Orbits: The paranasal sinuses and mastoid air cells are clear. The globes and orbits are within normal limits. IMPRESSION: 1. Acute/subacute nonhemorrhagic cortical infarct involving the anterior superior  right frontal lobe, right caudate head, anterior limb of the right internal capsule, and anterior right lentiform nucleus. 2. Remote posterior right MCA territory infarct. 3. Chronic occlusion of the right ICA. 4. Chronic attenuation of posterior right MCA branches. Electronically Signed   By: Marin Roberts M.D.   On: 08/16/2019 12:27   Ct Head Code Stroke Wo Contrast`  Result Date: 08/15/2019 CLINICAL DATA:  Code stroke. Neuro deficit for less than 6 hours. Left-sided weakness. EXAM: CT HEAD WITHOUT CONTRAST TECHNIQUE: Contiguous axial images were obtained from the base of the skull through the vertex without intravenous contrast. COMPARISON:  CT head without contrast 04/28/2015 FINDINGS: Brain: The posterior right MCA territory infarct is new since the prior study, but not acute. There is volume loss with ex vacuo dilation of the right lateral ventricle. There is subtle changes in the super ganglionic right precentral gyrus. Basal ganglia are intact. Insular ribbon is normal. No significant extra-axial fluid collection is present. There is loss of gray-white differentiation more anteriorly in the high right frontal lobe as well. Left hemisphere is unremarkable. Insert normal brainstem Vascular: No hyperdense vessel or unexpected calcification. Skull: Calvarium is intact. No focal lytic or blastic lesions are present. Sinuses/Orbits: The globes and orbits are within normal limits. The paranasal sinuses and mastoid air cells are clear. ASPECTS [facility] Stroke Program Early CT Score) - Ganglionic level infarction (caudate, lentiform nuclei, internal capsule, insula, M1-M3 cortex): 7/7 - Supraganglionic infarction (M4-M6 cortex): 1/3 Total score (0-10 with 10 being normal): 8/10 IMPRESSION: 1. Areas of acute/subacute nonhemorrhagic infarct in the high right frontal lobe are new since the prior study. 2. New but remote posterior right MCA territory infarcts. 3. No acute vascular lesion. 4. ASPECTS is 8/10  The above was relayed via text pager to Dr. Arther Dames on 08/15/2019 at 20:44 . Electronically Signed   By: Marin Roberts M.D.   On: 08/15/2019 20:45   Vas US Carotid  Result Date: 08/16/2019 Carotid Arterial Duplex Study Indications:       CVA and Weakness. Risk Factors:      Hypertension. Other Factors:     CTA neck - right ICA occlusion. Comparison Study:  no prior Performing Technologist: Jeb Levering RDMS, RVT  Examination Guidelines: A complete evaluation includes B-mode imaging, spectral Doppler, color Doppler, and power Doppler as needed of all accessible portions of each vessel. Bilateral testing is considered an integral part of a complete examination. Limited examinations for reoccurring indications may be performed as noted.  Right Carotid Findings: +----------+--------+-------+--------+-----------------+----------------------+           PSV cm/sEDV    StenosisPlaque           Comments                                 cm/s  Description                             +----------+--------+-------+--------+-----------------+----------------------+ CCA Prox  86      10                                                     +----------+--------+-------+--------+-----------------+----------------------+ CCA Distal54      11                                                     +----------+--------+-------+--------+-----------------+----------------------+ ICA Prox                 Occluded                 Thrombus vs soft                                                         plaque                 +----------+--------+-------+--------+-----------------+----------------------+ ECA       92      16                                                     +----------+--------+-------+--------+-----------------+----------------------+ +----------+--------+-------+----------------+-------------------+           PSV cm/sEDV cmsDescribe        Arm  Pressure (mmHG) +----------+--------+-------+----------------+-------------------+ [id number]            Multiphasic, WNL                    +----------+--------+-------+----------------+-------------------+ +---------+--------+--+--------+--+---------+ VertebralPSV cm/s65EDV cm/s20Antegrade +---------+--------+--+--------+--+---------+  Left Carotid Findings: +----------+--------+--------+--------+------------------+--------+           PSV cm/sEDV cm/sStenosisPlaque DescriptionComments +----------+--------+--------+--------+------------------+--------+ CCA Prox  130     34                                         +----------+--------+--------+--------+------------------+--------+ CCA Distal129     36                                         +----------+--------+--------+--------+------------------+--------+ ICA Prox  152     53      40-59%  heterogenous               +----------+--------+--------+--------+------------------+--------+ ICA Distal119     41                                         +----------+--------+--------+--------+------------------+--------+ ECA  100     18                                         +----------+--------+--------+--------+------------------+--------+ +----------+--------+--------+----------------+-------------------+           PSV cm/sEDV cm/sDescribe        Arm Pressure (mmHG) +----------+--------+--------+----------------+-------------------+ [id number]             Multiphasic, WNL                    +----------+--------+--------+----------------+-------------------+ +---------+--------+--+--------+--+---------+ VertebralPSV cm/s66EDV cm/s18Antegrade +---------+--------+--+--------+--+---------+  Summary: Right Carotid: Evidence consistent with a total occlusion of the right ICA.                Sonographic appearance of thrombus versus soft plaque in the ICA. Left Carotid: Velocities in the left ICA  are consistent with a 40-59% stenosis.               Elevated velocities may be due to compensatory flow from the               contralateral occlusion.  *See table(s) above for measurements and observations.  Electronically signed by Waverly Ferrari MD on 08/16/2019 at 5:58:33 PM.    Final    Vas [place] Lower Extremity Venous (dvt)  Result Date: 08/16/2019  Lower Venous Study Indications: Stroke.  Comparison Study: no prior Performing Technologist: Jeb Levering RDMS, RVT  Examination Guidelines: A complete evaluation includes B-mode imaging, spectral Doppler, color Doppler, and power Doppler as needed of all accessible portions of each vessel. Bilateral testing is considered an integral part of a complete examination. Limited examinations for reoccurring indications may be performed as noted.  +---------+---------------+---------+-----------+----------+--------------+ RIGHT    CompressibilityPhasicitySpontaneityPropertiesThrombus Aging +---------+---------------+---------+-----------+----------+--------------+ CFV      Full           Yes      Yes                                 +---------+---------------+---------+-----------+----------+--------------+ SFJ      Full                                                        +---------+---------------+---------+-----------+----------+--------------+ FV Prox  Full                                                        +---------+---------------+---------+-----------+----------+--------------+ FV Mid   Full                                                        +---------+---------------+---------+-----------+----------+--------------+ FV DistalFull                                                        +---------+---------------+---------+-----------+----------+--------------+  PFV      Full                                                        +---------+---------------+---------+-----------+----------+--------------+  POP      Full           Yes      Yes                                 +---------+---------------+---------+-----------+----------+--------------+ PTV      Full                                                        +---------+---------------+---------+-----------+----------+--------------+ PERO     Full                                                        +---------+---------------+---------+-----------+----------+--------------+   +---------+---------------+---------+-----------+----------+--------------+ LEFT     CompressibilityPhasicitySpontaneityPropertiesThrombus Aging +---------+---------------+---------+-----------+----------+--------------+ CFV      Full           Yes      Yes                                 +---------+---------------+---------+-----------+----------+--------------+ SFJ      Full                                                        +---------+---------------+---------+-----------+----------+--------------+ FV Prox  Full                                                        +---------+---------------+---------+-----------+----------+--------------+ FV Mid   Full                                                        +---------+---------------+---------+-----------+----------+--------------+ FV DistalFull                                                        +---------+---------------+---------+-----------+----------+--------------+ PFV      Full                                                        +---------+---------------+---------+-----------+----------+--------------+  POP      Full           Yes      Yes                                 +---------+---------------+---------+-----------+----------+--------------+ PTV      Full                                                        +---------+---------------+---------+-----------+----------+--------------+ PERO     Full                                                         +---------+---------------+---------+-----------+----------+--------------+     Summary: Right: There is no evidence of deep vein thrombosis in the lower extremity. No cystic structure found in the popliteal fossa. Left: There is no evidence of deep vein thrombosis in the lower extremity. No cystic structure found in the popliteal fossa.  *See table(s) above for measurements and observations. Electronically signed by Waverly Ferrari MD on 08/16/2019 at 5:58:15 PM.    Final     Echocardiogram 08/16/2019 IMPRESSIONS  1. Left ventricular ejection fraction, by visual estimation, is 50 to 55%. The left ventricle has normal function. There is no left ventricular hypertrophy.  2. Global right ventricle has normal systolic function.The right ventricular size is normal. No increase in right ventricular wall thickness.  3. Left atrial size was mildly dilated.  4. Right atrial size was normal.  5. The mitral valve is normal in structure. Trace mitral valve regurgitation.  6. The tricuspid valve is normal in structure. Tricuspid valve regurgitation is trivial.  7. The aortic valve is normal in structure. Aortic valve regurgitation is not visualized. No evidence of aortic valve sclerosis or stenosis.  8. The pulmonic valve was grossly normal. Pulmonic valve regurgitation is not visualized.  9. The atrial septum is grossly normal.   Discharge Exam: Vitals:   08/21/19 0513 08/21/19 0833  BP: 131/81 124/66  Pulse: 78 80  Resp:  18  Temp: 98 F (36.7 C) 98.3 F (36.8 C)  SpO2: 95% 94%     General: Pt is alert, awake, not in acute distress Cardiovascular: RRR, S1/S2 +, no edema Respiratory: CTA bilaterally, no wheezing, no rhonchi, no respiratory distress, no conversational dyspnea  Abdominal: Soft, NT, ND, bowel sounds + Extremities: no edema, no cyanosis Neuro: Alert, awake, oriented, speech clear, residual LUE hemiparesis  Psych: Normal mood and affect, stable  judgement and insight     The results of significant diagnostics from this hospitalization (including imaging, microbiology, ancillary and laboratory) are listed below for reference.     Microbiology: Recent Results (from the past 240 hour(s))  SARS CORONAVIRUS 2 (TAT 6-24 HRS) Nasopharyngeal Nasopharyngeal Swab     Status: None   Collection Time: 08/15/19  9:40 PM   Specimen: Nasopharyngeal Swab  Result Value Ref Range Status   SARS Coronavirus 2 NEGATIVE NEGATIVE Final    Comment: (NOTE) SARS-CoV-2 target nucleic acids are NOT DETECTED. The SARS-CoV-2 RNA is generally detectable in upper and lower respiratory specimens during the acute  phase of infection. Negative results do not preclude SARS-CoV-2 infection, do not rule out co-infections with other pathogens, and should not be used as the sole basis for treatment or other patient management decisions. Negative results must be combined with clinical observations, patient history, and epidemiological information. The expected result is Negative. Fact Sheet for Patients: HairSlick.no Fact Sheet for Healthcare Providers: quierodirigir.com This test is not yet approved or cleared by the [place] FDA and  has been authorized for detection and/or diagnosis of SARS-CoV-2 by FDA under an Emergency Use Authorization (EUA). This EUA will remain  in effect (meaning this test can be used) for the duration of the COVID-19 declaration under Section 56 4(b)(1) of the Act, 21 U.S.C. section 360bbb-3(b)(1), unless the authorization is terminated or revoked sooner. Performed at [facility], [address] [place], [place] [address]   SARS CORONAVIRUS 2 (TAT 6-24 HRS) Nasopharyngeal Nasopharyngeal Swab     Status: None   Collection Time: 08/20/19  2:40 PM   Specimen: Nasopharyngeal Swab  Result Value Ref Range Status   SARS Coronavirus 2 NEGATIVE NEGATIVE Final    Comment:  (NOTE) SARS-CoV-2 target nucleic acids are NOT DETECTED. The SARS-CoV-2 RNA is generally detectable in upper and lower respiratory specimens during the acute phase of infection. Negative results do not preclude SARS-CoV-2 infection, do not rule out co-infections with other pathogens, and should not be used as the sole basis for treatment or other patient management decisions. Negative results must be combined with clinical observations, patient history, and epidemiological information. The expected result is Negative. Fact Sheet for Patients: HairSlick.no Fact Sheet for Healthcare Providers: quierodirigir.com This test is not yet approved or cleared by the [place] FDA and  has been authorized for detection and/or diagnosis of SARS-CoV-2 by FDA under an Emergency Use Authorization (EUA). This EUA will remain  in effect (meaning this test can be used) for the duration of the COVID-19 declaration under Section 56 4(b)(1) of the Act, 21 U.S.C. section 360bbb-3(b)(1), unless the authorization is terminated or revoked sooner. Performed at [facility], [address], [place], [place] [address]      Labs: BNP (last 3 results) No results for input(s): BNP in the last 8760 hours. Basic Metabolic Panel: Recent Labs  Lab 08/15/19 2255 08/15/19 2320 08/16/19 0516 08/17/19 0245 08/20/19 0217  NA 137 139  --  138 138  K 5.0 4.0  --  4.1 4.3  CL 103 103  --  105 102  CO2  --  25  --  25 25  GLUCOSE 70 92  --  101* 99  BUN 27* 16  --  17 20  CREATININE 0.90 0.87 0.89 0.79 0.92  CALCIUM  --  9.1  --  9.0 9.0  MG  --   --   --  2.0 2.2  PHOS  --   --   --  3.6 4.1   Liver Function Tests: Recent Labs  Lab 08/15/19 2320 08/17/19 0245 08/20/19 0217  AST 20  --   --   ALT 23  --   --   ALKPHOS 52  --   --   BILITOT 0.4  --   --   PROT 6.2*  --   --   ALBUMIN 3.4* 3.4* 3.3*   No results for input(s): LIPASE,  AMYLASE in the last 168 hours. No results for input(s): AMMONIA in the last 168 hours. CBC: Recent Labs  Lab 08/15/19 2112 08/15/19  2255 08/16/19 0516 08/17/19 0245 08/20/19 0217  WBC 11.0*  --  11.5* 9.4 9.8  NEUTROABS 7.8*  --   --  5.1 5.3  HGB 12.1 12.9 12.4 11.9* 12.3  HCT 38.5 38.0 39.1 37.6 39.4  MCV 86.9  --  85.9 85.6 87.0  PLT 227  --  369 314 336   Cardiac Enzymes: No results for input(s): CKTOTAL, CKMB, CKMBINDEX, TROPONINI in the last 168 hours. BNP: Invalid input(s): POCBNP CBG: Recent Labs  Lab 08/15/19 2311  GLUCAP 84   D-Dimer No results for input(s): DDIMER in the last 72 hours. Hgb A1c No results for input(s): HGBA1C in the last 72 hours. Lipid Profile No results for input(s): CHOL, HDL, LDLCALC, TRIG, CHOLHDL, LDLDIRECT in the last 72 hours. Thyroid function studies No results for input(s): TSH, T4TOTAL, T3FREE, THYROIDAB in the last 72 hours.  Invalid input(s): FREET3 Anemia work up No results for input(s): VITAMINB12, FOLATE, FERRITIN, TIBC, IRON, RETICCTPCT in the last 72 hours. Urinalysis    Component Value Date/Time   COLORURINE YELLOW (A) 04/03/2018 1435   APPEARANCEUR CLEAR (A) 04/03/2018 1435   LABSPEC 1.012 04/03/2018 1435   PHURINE 6.0 04/03/2018 1435   GLUCOSEU NEGATIVE 04/03/2018 1435   HGBUR MODERATE (A) 04/03/2018 1435   BILIRUBINUR NEGATIVE 04/03/2018 1435   KETONESUR NEGATIVE 04/03/2018 1435   PROTEINUR NEGATIVE 04/03/2018 1435   NITRITE NEGATIVE 04/03/2018 1435   LEUKOCYTESUR NEGATIVE 04/03/2018 1435   Sepsis Labs Invalid input(s): PROCALCITONIN,  WBC,  LACTICIDVEN Microbiology Recent Results (from the past 240 hour(s))  SARS CORONAVIRUS 2 (TAT 6-24 HRS) Nasopharyngeal Nasopharyngeal Swab     Status: None   Collection Time: 08/15/19  9:40 PM   Specimen: Nasopharyngeal Swab  Result Value Ref Range Status   SARS Coronavirus 2 NEGATIVE NEGATIVE Final    Comment: (NOTE) SARS-CoV-2 target nucleic acids are NOT  DETECTED. The SARS-CoV-2 RNA is generally detectable in upper and lower respiratory specimens during the acute phase of infection. Negative results do not preclude SARS-CoV-2 infection, do not rule out co-infections with other pathogens, and should not be used as the sole basis for treatment or other patient management decisions. Negative results must be combined with clinical observations, patient history, and epidemiological information. The expected result is Negative. Fact Sheet for Patients: HairSlick.no Fact Sheet for Healthcare Providers: quierodirigir.com This test is not yet approved or cleared by the [place] FDA and  has been authorized for detection and/or diagnosis of SARS-CoV-2 by FDA under an Emergency Use Authorization (EUA). This EUA will remain  in effect (meaning this test can be used) for the duration of the COVID-19 declaration under Section 56 4(b)(1) of the Act, 21 U.S.C. section 360bbb-3(b)(1), unless the authorization is terminated or revoked sooner. Performed at [facility], [address] [place], [place] [address]   SARS CORONAVIRUS 2 (TAT 6-24 HRS) Nasopharyngeal Nasopharyngeal Swab     Status: None   Collection Time: 08/20/19  2:40 PM   Specimen: Nasopharyngeal Swab  Result Value Ref Range Status   SARS Coronavirus 2 NEGATIVE NEGATIVE Final    Comment: (NOTE) SARS-CoV-2 target nucleic acids are NOT DETECTED. The SARS-CoV-2 RNA is generally detectable in upper and lower respiratory specimens during the acute phase of infection. Negative results do not preclude SARS-CoV-2 infection, do not rule out co-infections with other pathogens, and should not be used as the sole basis for treatment or other patient management decisions. Negative results must be combined with clinical observations, patient history, and epidemiological  information. The expected result is Negative. Fact Sheet for  Patients: HairSlick.no Fact Sheet for Healthcare Providers: quierodirigir.com This test is not yet approved or cleared by the [place] FDA and  has been authorized for detection and/or diagnosis of SARS-CoV-2 by FDA under an Emergency Use Authorization (EUA). This EUA will remain  in effect (meaning this test can be used) for the duration of the COVID-19 declaration under Section 56 4(b)(1) of the Act, 21 U.S.C. section 360bbb-3(b)(1), unless the authorization is terminated or revoked sooner. Performed at [facility], [address] [place], [place] [address]      Patient was seen and examined on the day of discharge and was found to be in stable condition. Time coordinating discharge: 40 minutes including assessment and coordination of care, as well as examination of the patient.   SIGNED:  Noralee Stain, DO [facility] 08/21/2019, 10:45 AM

## 2019-08-22 LAB — MTHFR DNA ANALYSIS

## 2019-10-14 ENCOUNTER — Telehealth: Payer: Self-pay

## 2019-10-14 ENCOUNTER — Inpatient Hospital Stay: Payer: Self-pay | Admitting: Adult Health

## 2019-10-14 NOTE — Progress Notes (Deleted)
[facility] [address] [place]. [place] [address] [phone]       HOSPITAL FOLLOW UP NOTE  [name] Date of Birth:  25-Mar-1966 Medical Record Number:  [id number]   Reason for Referral:  hospital stroke follow up    CHIEF COMPLAINT:  No chief complaint on file.   HPI: [name] being seen today for in office hospital follow-up regarding ***.  History obtained from *** and chart review. Reviewed all radiology images and labs personally.  [name] is a 53 y.o. female with history of anxiety, fibromyalgia, tobacco use, hypertension and left hip osteoarthritis,obesity presenting with left arm and leg weakness. She did not receive IV t-PA due to late presentation (>4.5 hours from time of onset).  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      ROS:   14 system review of systems performed and negative with exception of ***  PMH:  Past Medical History:  Diagnosis Date  . Anxiety   . Fibromyalgia   . Hypertension   . Osteoarthritis of left hip 2019    PSH:  Past Surgical History:  Procedure Laterality Date  . ABDOMINAL SURGERY  1996   gastric bypass; stapling; surgilite  . PLANTAR FASCIA SURGERY Right 2009  . TOTAL HIP ARTHROPLASTY Left 04/09/2018   Procedure: TOTAL HIP ARTHROPLASTY ANTERIOR APPROACH;  Surgeon: Kennedy Bucker, MD;  Location: [facility] ORS;  Service: Orthopedics;  Laterality: Left;    Social History:  Social History   Socioeconomic History  . Marital status: Married    Spouse name: Not on file  . Number of children: Not on file  . Years of education: Not on file  . Highest education level: Not on file  Occupational History  . Not on file  Tobacco Use  . Smoking status: Current Every Day Smoker    Packs/day: 1.00    Types: Cigarettes  . Smokeless tobacco: Never Used  Substance and Sexual Activity  . Alcohol use:  Yes    Comment: occassionally  . Drug use: Yes    Types: Marijuana    Comment: last week  . Sexual activity: Never  Other Topics Concern  . Not on file  Social History Narrative   ** Merged History Encounter **       Social Determinants of Health   Financial Resource Strain:   . Difficulty of Paying Living Expenses: Not on file  Food Insecurity:   . Worried About Programme researcher, broadcasting/film/video in the Last Year: Not on file  . Ran Out of Food in the Last Year: Not on file  Transportation Needs:   . Lack of Transportation (Medical): Not on file  . Lack of Transportation (Non-Medical): Not on file  Physical Activity:   . Days of Exercise per Week: Not on file  . Minutes of Exercise per Session: Not on file  Stress:   . Feeling of Stress : Not on file  Social Connections:   . Frequency of Communication with Friends and Family: Not on file  . Frequency of Social Gatherings with Friends and Family: Not on file  . Attends Religious Services: Not on file  . Active Member of Clubs or Organizations: Not on file  . Attends Banker Meetings: Not on file  . Marital Status: Not on file  Intimate Partner Violence:   . Fear of Current or Ex-Partner: Not on file  .  Emotionally Abused: Not on file  . Physically Abused: Not on file  . Sexually Abused: Not on file    Family History:  Family History  Problem Relation Age of Onset  . Aneurysm Mother   . Heart disease Father     Medications:   Current Outpatient Medications on File Prior to Visit  Medication Sig Dispense Refill  . Apple Cid Vn-Grn Tea-Bit Or-Cr (APPLE CIDER VINEGAR PLUS) TABS Take 1 tablet by mouth 2 (two) times daily.    [name] Kitchen. aspirin EC 325 MG EC tablet Take 1 tablet (325 mg total) by mouth daily. 30 tablet 2  . atorvastatin (LIPITOR) 40 MG tablet Take 1 tablet (40 mg total) by mouth daily at 6 PM. 30 tablet 2  . baclofen (LIORESAL) 10 MG tablet Take 10 mg by mouth 2 (two) times daily as needed for muscle spasms.    [name] Kitchen.  buPROPion (WELLBUTRIN XL) 150 MG 24 hr tablet Take 150 mg by mouth daily.    . Calcium Carbonate-Vit D-Min (CALTRATE 600+D PLUS PO) Take 1 tablet by mouth daily.    . clonazePAM (KLONOPIN) 0.5 MG tablet Take 0.25-0.5 tablets by mouth 3 (three) times daily as needed for anxiety (or sleep).     . clopidogrel (PLAVIX) 75 MG tablet Take 1 tablet (75 mg total) by mouth daily. 30 tablet 2  . DULoxetine (CYMBALTA) 60 MG capsule Take 60 mg by mouth daily.  0  . escitalopram (LEXAPRO) 10 MG tablet Take 10 mg by mouth daily.    . fluticasone (FLONASE) 50 MCG/ACT nasal spray Place 1 spray into both nostrils 2 (two) times daily as needed for allergies or rhinitis.     [name] Kitchen. lisinopril (ZESTRIL) 10 MG tablet Take 1 tablet (10 mg total) by mouth daily. 30 tablet 2  . Multiple Vitamins-Minerals (MULTI FOR HER 50+ PO) Take 1 tablet by mouth daily.    . Omega 3 1000 MG CAPS Take 1,000 mg by mouth daily with breakfast.     . pregabalin (LYRICA) 150 MG capsule Take 150 mg by mouth 2 (two) times daily.    [name] Kitchen. tiZANidine (ZANAFLEX) 4 MG tablet Take 2-4 mg by mouth See admin instructions. Take 2 mg by mouth at bedtime and an additional 2-4 mg once a day as needed for spasms  0  . traMADol (ULTRAM) 50 MG tablet Take 50 mg by mouth 2 (two) times daily.     No current facility-administered medications on file prior to visit.    Allergies:   Allergies  Allergen Reactions  . Penicillins Hives    Did it involve swelling of the face/tongue/throat, SOB, or low BP? Yes Did it involve sudden or severe rash/hives, skin peeling, or any reaction on the inside of your mouth or nose? No Did you need to seek medical attention at a hospital or doctor's office? No When did it last happen? Within the past 10 years If all above answers are "NO", may proceed with cephalosporin use.      Physical Exam  There were no vitals filed for this visit. There is no height or weight on file to calculate BMI. No exam data present  No  flowsheet data found.   General: well developed, well nourished, seated, in no evident distress Head: head normocephalic and atraumatic.   Neck: supple with no carotid or supraclavicular bruits Cardiovascular: regular rate and rhythm, no murmurs Musculoskeletal: no deformity Skin:  no rash/petichiae Vascular:  Normal pulses all extremities   Neurologic Exam Mental Status:  Awake and fully alert. Oriented to place and time. Recent and remote memory intact. Attention span, concentration and fund of knowledge appropriate. Mood and affect appropriate.  Cranial Nerves: Fundoscopic exam reveals sharp disc margins. Pupils equal, briskly reactive to light. Extraocular movements full without nystagmus. Visual fields full to confrontation. Hearing intact. Facial sensation intact. Face, tongue, palate moves normally and symmetrically.  Motor: Normal bulk and tone. Normal strength in all tested extremity muscles. Sensory.: intact to touch , pinprick , position and vibratory sensation.  Coordination: Rapid alternating movements normal in all extremities. Finger-to-nose and heel-to-shin performed accurately bilaterally. Gait and Station: Arises from chair without difficulty. Stance is normal. Gait demonstrates normal stride length and balance Reflexes: 1+ and symmetric. Toes downgoing.     NIHSS  *** Modified Rankin  *** CHA2DS2-VASc *** HAS-BLED ***   Diagnostic Data (Labs, Imaging, Testing) Ct Angio Head W Or Wo Contrast Ct Angio Neck W Or Wo Contrast 08/15/2019 IMPRESSION:  1. 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  2. Marked attenuation of distal posterior right MCA branches compatible with the more remote infarct.  3. No significant anterior stenosis or occlusion.  4. Left MCA branches are normal.  5. Posterior circulation is unremarkable.   Mr Brain Wo Contrast 08/16/2019 IMPRESSION:  1.  Acute/subacute nonhemorrhagic cortical infarct involving the anterior superior right frontal lobe, right caudate head, anterior limb of the right internal capsule, and anterior right lentiform nucleus.  2. Remote posterior right MCA territory infarct.  3. Chronic occlusion of the right ICA.  4. Chronic attenuation of posterior right MCA branches.   Ct Head Code Stroke Wo Contrast 08/15/2019 IMPRESSION:  1. Areas of acute/subacute nonhemorrhagic infarct in the high right frontal lobe are new since the prior study.  2. New but remote posterior right MCA territory infarcts.  3. No acute vascular lesion.  4. ASPECTS is 8/10    Transthoracic Echocardiogram  1. Left ventricular ejection fraction, by visual estimation, is 50 to 55%. The left ventricle has normal function. There is no left ventricular hypertrophy. 2. Global right ventricle has normal systolic function.The right ventricular size is normal. No increase in right ventricular wall thickness. 3. Left atrial size was mildly dilated. 4. Right atrial size was normal. 5. The mitral valve is normal in structure. Trace mitral valve regurgitation. 6. The tricuspid valve is normal in structure. Tricuspid valve regurgitation is trivial. 7. The aortic valve is normal in structure. Aortic valve regurgitation is not visualized. No evidence of aortic valve sclerosis or stenosis. 8. The pulmonic valve was grossly normal. Pulmonic valve regurgitation is not visualized. 9. The atrial septum is grossly normal.  Carotid doppler Right Carotid: Evidence consistent with a total occlusion of the right ICA. Sonographic appearance of thrombus versus soft plaque in the ICA.  Left Carotid: Velocities in the left ICA are consistent with a 40-59% stenosis. Elevated velocities may be due to compensatory flow from the contralateral occlusion.  ECG - SR rate 87 BPM. (See cardiology reading for complete  details)  LE venous Doppler Right: There is no evidence of deep vein thrombosis in the lower extremity. No cystic structure found in the popliteal fossa. Left: There is no evidence of deep vein thrombosis in the lower extremity. No cystic structure found in the popliteal fossa.     ASSESSMENT: Terri Wood is a 53 y.o. year old female presented with *** on *** secondary to ***. Vascular risk factors  include ***.     PLAN:  1. *** : Continue {anticoagulants:31417}  and ***  for secondary stroke prevention. Maintain strict control of hypertension with blood pressure goal below 130/90, diabetes with hemoglobin A1c goal below 6.5% and cholesterol with LDL cholesterol (bad cholesterol) goal below 70 mg/dL.  I also advised the patient to eat a healthy diet with plenty of whole grains, cereals, fruits and vegetables, exercise regularly with at least 30 minutes of continuous activity daily and maintain ideal body weight. 2. HTN: Advised to continue current treatment regimen.  Today's BP ***.  Advised to continue to monitor at home along with continued follow-up with PCP for management 3. HLD: Advised to continue current treatment regimen along with continued follow-up with PCP for future prescribing and monitoring of lipid panel 4. DMII: Advised to continue to monitor glucose levels at home along with continued follow-up with PCP for management and monitoring    Follow up in *** or call earlier if needed   Greater than 50% of time during this 45 minute visit was spent on counseling, explanation of diagnosis of ***, reviewing risk factor management of ***, planning of further management along with potential future management, and discussion with patient and family answering all questions.    Ihor Austin, AGNP-BC  [facility] [address] [place], [place] [address]  Phone [phone] Fax [phone] Note: This document was prepared with digital  dictation and possible smart phrase technology. Any transcriptional errors that result from this process are unintentional.

## 2019-10-14 NOTE — Telephone Encounter (Signed)
Patient was a no call/no show for their appointment today.   

## 2019-10-15 ENCOUNTER — Encounter: Payer: Self-pay | Admitting: Adult Health

## 2020-05-03 DIAGNOSIS — I63511 Cerebral infarction due to unspecified occlusion or stenosis of right middle cerebral artery: Secondary | ICD-10-CM

## 2020-05-03 HISTORY — DX: Cerebral infarction due to unspecified occlusion or stenosis of right middle cerebral artery: I63.511

## 2020-12-17 ENCOUNTER — Emergency Department: Payer: Self-pay

## 2020-12-17 ENCOUNTER — Encounter: Payer: Self-pay | Admitting: Emergency Medicine

## 2020-12-17 ENCOUNTER — Emergency Department
Admission: EM | Admit: 2020-12-17 | Discharge: 2020-12-17 | Disposition: A | Discharge status: Home / Self Care | Payer: Self-pay | Attending: Emergency Medicine | Admitting: Emergency Medicine

## 2020-12-17 ENCOUNTER — Other Ambulatory Visit: Payer: Self-pay

## 2020-12-17 DIAGNOSIS — M25562 Pain in left knee: Secondary | ICD-10-CM | POA: Insufficient documentation

## 2020-12-17 DIAGNOSIS — F17210 Nicotine dependence, cigarettes, uncomplicated: Secondary | ICD-10-CM | POA: Insufficient documentation

## 2020-12-17 DIAGNOSIS — Z7902 Long term (current) use of antithrombotics/antiplatelets: Secondary | ICD-10-CM | POA: Insufficient documentation

## 2020-12-17 DIAGNOSIS — M25561 Pain in right knee: Secondary | ICD-10-CM | POA: Insufficient documentation

## 2020-12-17 DIAGNOSIS — I10 Essential (primary) hypertension: Secondary | ICD-10-CM | POA: Insufficient documentation

## 2020-12-17 DIAGNOSIS — W19XXXA Unspecified fall, initial encounter: Secondary | ICD-10-CM | POA: Insufficient documentation

## 2020-12-17 DIAGNOSIS — Z96642 Presence of left artificial hip joint: Secondary | ICD-10-CM | POA: Insufficient documentation

## 2020-12-17 DIAGNOSIS — Z79899 Other long term (current) drug therapy: Secondary | ICD-10-CM | POA: Insufficient documentation

## 2020-12-17 DIAGNOSIS — Z7982 Long term (current) use of aspirin: Secondary | ICD-10-CM | POA: Insufficient documentation

## 2020-12-17 MED ORDER — TRAMADOL HCL 50 MG PO TABS: 50.0000 mg | ORAL_TABLET | Freq: Four times a day (QID) | ORAL | 0 refills | Status: DC | PRN

## 2020-12-17 MED ORDER — LIDOCAINE 5 % EX PTCH: 1.0000 | MEDICATED_PATCH | Freq: Two times a day (BID) | CUTANEOUS | 0 refills | Status: DC

## 2020-12-17 MED ORDER — TRAMADOL HCL 50 MG PO TABS
50.0000 mg | ORAL_TABLET | Freq: Once | ORAL | Status: AC
  Administered 2020-12-17: 50 mg via ORAL
  Filled 2020-12-17: qty 1

## 2020-12-17 NOTE — Discharge Instructions (Addendum)
Moderate degenerative changes no recent x-ray of the right knee.  No other acute findings.  Follow discharge care instruction take medication as directed.

## 2020-12-17 NOTE — ED Notes (Signed)
See triage note Presents with bilateral knee pain    Denies any fall or trauma  States she needs her knee replaced

## 2020-12-17 NOTE — ED Provider Notes (Signed)
[facility] Emergency Department Provider Note   ____________________________________________   Event Date/Time   First MD Initiated Contact with Patient 12/17/20 1104     (approximate)  I have reviewed the triage vital signs and the nursing notes.   HISTORY  Chief Complaint Knee Pain    HPI Terri Wood is a 55 y.o. female patient complain of bilateral knee pain right greater than left.  Patient state history of chronic knee pain.  Patient states she still does have bone-on-bone but because her obesity she cannot have surgery.  Patient says she fell 2 days ago landing on the right knee increasing her pain complaint.  Patient rates pain as 8/10.  Patient scribed pain is "achy.  Patient stated she blew her left knee pain is due to her compensating due to to a fall on the right knee.         Past Medical History:  Diagnosis Date  . Anxiety   . Fibromyalgia   . Hypertension   . Osteoarthritis of left hip 2019    Patient Active Problem List   Diagnosis Date Noted  . Essential hypertension 08/16/2019  . Fibromyalgia 08/16/2019  . Hyperlipidemia   . Smoker   . Acute CVA (cerebrovascular accident) (HCC) 08/15/2019  . Avascular necrosis of left femoral head (HCC) 04/09/2018    Past Surgical History:  Procedure Laterality Date  . ABDOMINAL SURGERY  1996   gastric bypass; stapling; surgilite  . PLANTAR FASCIA SURGERY Right 2009  . TOTAL HIP ARTHROPLASTY Left 04/09/2018   Procedure: TOTAL HIP ARTHROPLASTY ANTERIOR APPROACH;  Surgeon: Kennedy Bucker, MD;  Location: [facility] ORS;  Service: Orthopedics;  Laterality: Left;    Prior to Admission medications   Medication Sig Start Date End Date Taking? Authorizing Provider  lidocaine (LIDODERM) 5 % Place 1 patch onto the skin every 12 (twelve) hours. Remove & Discard patch within 12 hours or as directed by MD 12/17/20 12/17/21 Yes [name] Reining, PA-C  traMADol (ULTRAM) 50 MG tablet Take 1 tablet (50 mg total)  by mouth every 6 (six) hours as needed for moderate pain. 12/17/20  Yes [name] Reining, PA-C  Apple Cid Vn-Grn Tea-Bit Or-Cr (APPLE CIDER VINEGAR PLUS) TABS Take 1 tablet by mouth 2 (two) times daily.    [provider]  aspirin EC 325 MG EC tablet Take 1 tablet (325 mg total) by mouth daily. 08/21/19   Noralee Stain, DO  atorvastatin (LIPITOR) 40 MG tablet Take 1 tablet (40 mg total) by mouth daily at 6 PM. 08/21/19   Noralee Stain, DO  baclofen (LIORESAL) 10 MG tablet Take 10 mg by mouth 2 (two) times daily as needed for muscle spasms.    [provider]  buPROPion (WELLBUTRIN XL) 150 MG 24 hr tablet Take 150 mg by mouth daily.    [provider]  Calcium Carbonate-Vit D-Min (CALTRATE 600+D PLUS PO) Take 1 tablet by mouth daily.    [provider]  clonazePAM (KLONOPIN) 0.5 MG tablet Take 0.25-0.5 tablets by mouth 3 (three) times daily as needed for anxiety (or sleep).  12/11/14   [provider]  clopidogrel (PLAVIX) 75 MG tablet Take 1 tablet (75 mg total) by mouth daily. 08/21/19   Noralee Stain, DO  DULoxetine (CYMBALTA) 60 MG capsule Take 60 mg by mouth daily. 03/13/18   [provider]  escitalopram (LEXAPRO) 10 MG tablet Take 10 mg by mouth daily.    [provider]  fluticasone (FLONASE) 50 MCG/ACT nasal  spray Place 1 spray into both nostrils 2 (two) times daily as needed for allergies or rhinitis.  07/11/19   [provider]  lisinopril (ZESTRIL) 10 MG tablet Take 1 tablet (10 mg total) by mouth daily. 08/21/19   Noralee Stain, DO  Multiple Vitamins-Minerals (MULTI FOR HER 50+ PO) Take 1 tablet by mouth daily.    [provider]  Omega 3 1000 MG CAPS Take 1,000 mg by mouth daily with breakfast.     [provider]  pregabalin (LYRICA) 150 MG capsule Take 150 mg by mouth 2 (two) times daily. 08/13/19   [provider]  tiZANidine (ZANAFLEX) 4 MG tablet Take 2-4 mg by mouth See admin instructions.  Take 2 mg by mouth at bedtime and an additional 2-4 mg once a day as needed for spasms 01/27/18   [provider]  traMADol (ULTRAM) 50 MG tablet Take 50 mg by mouth 2 (two) times daily. 07/14/19   [provider]    Allergies Penicillins  Family History  Problem Relation Age of Onset  . Aneurysm Mother   . Heart disease Father     Social History Social History   Tobacco Use  . Smoking status: Current Every Day Smoker    Packs/day: 1.00    Types: Cigarettes  . Smokeless tobacco: Never Used  Vaping Use  . Vaping Use: Never used  Substance Use Topics  . Alcohol use: Yes    Comment: occassionally  . Drug use: Yes    Types: Marijuana    Comment: last week    Review of Systems Constitutional: No fever/chills Eyes: No visual changes. ENT: No sore throat. Cardiovascular: Denies chest pain. Respiratory: Denies shortness of breath. Gastrointestinal: No abdominal pain.  No nausea, no vomiting.  No diarrhea.  No constipation. Genitourinary: Negative for dysuria. Musculoskeletal: Bilateral knee pain.. Skin: Negative for rash. Neurological: Negative for headaches, focal weakness or numbness. Psychiatric:  Anxiety.   Endocrine:  Hypertension. Allergic/Immunilogical: Penicillin ____________________________________________   PHYSICAL EXAM:  VITAL SIGNS: ED Triage Vitals  Enc Vitals Group     BP 12/17/20 1041 122/68     Pulse Rate 12/17/20 1041 71     Resp 12/17/20 1041 (!) 22     Temp 12/17/20 1041 97.6 F (36.4 C)     Temp Source 12/17/20 1041 Oral     SpO2 --      Weight 12/17/20 1102 235 lb 0.2 oz (106.6 kg)     Height 12/17/20 1102 5' 0.5" (1.537 m)     Head Circumference --      Peak Flow --      Pain Score 12/17/20 1036 8     Pain Loc --      Pain Edu? --      Excl. in GC? --    Constitutional: Alert and oriented. Well appearing and in no acute distress.  BMI is 45.14. Cardiovascular: Normal rate, regular rhythm. Grossly normal heart sounds.   Good peripheral circulation. Respiratory: Normal respiratory effort.  No retractions. Lungs CTAB. Musculoskeletal:  Neurologic:  Normal speech and language. No gross focal neurologic deficits are appreciated. No gait instability. Skin:  Skin is warm, dry and intact. No rash noted.  No abrasion or ecchymosis. Psychiatric: Mood and affect are normal. Speech and behavior are normal.  ____________________________________________   LABS (all labs ordered are listed, but only abnormal results are displayed)  Labs Reviewed - No data to display ____________________________________________  EKG   ____________________________________________  RADIOLOGY Marylin Crosby  Katrinka Blazing, personally viewed and evaluated these images (plain radiographs) as part of my medical decision making, as well as reviewing the written report by the radiologist.  ED MD interpretation: Moderate degenerative changes but no acute findings. Official radiology report(s): DG Knee Complete 4 Views Right  Result Date: 12/17/2020 CLINICAL DATA:  Acute left knee pain after fall. EXAM: RIGHT KNEE - COMPLETE 4+ VIEW COMPARISON:  None. FINDINGS: No evidence of fracture, dislocation, or joint effusion. Moderate narrowing of the medial joint space and patellofemoral space is noted with osteophyte formation. Osteophyte formation is noted laterally as well. Soft tissues are unremarkable. IMPRESSION: Moderate degenerative joint disease. No acute abnormality seen in the right knee. Electronically Signed   By: Lupita Raider M.D.   On: 12/17/2020 11:46    ____________________________________________   PROCEDURES  Procedure(s) performed (including Critical Care):  Procedures   ____________________________________________   INITIAL IMPRESSION / ASSESSMENT AND PLAN / ED COURSE  As part of my medical decision making, I reviewed the following data within the electronic MEDICAL RECORD NUMBER         Patient presents with right knee pain  secondary to a fall 2 days ago.  Patient has a history of chronic knee pain secondary to degenerative changes.  Discussed no acute findings on x-ray of the right knee.  Patient given discharge care instruction advised take medication as directed.  Advised follow-up with treating orthopedics.      ____________________________________________   FINAL CLINICAL IMPRESSION(S) / ED DIAGNOSES  Final diagnoses:  Acute pain of right knee     ED Discharge Orders         Ordered    traMADol (ULTRAM) 50 MG tablet  Every 6 hours PRN        12/17/20 1233    lidocaine (LIDODERM) 5 %  Every 12 hours        12/17/20 1233          *Please note:  Viney Acocella was evaluated in Emergency Department on 12/17/2020 for the symptoms described in the history of present illness. She was evaluated in the context of the global COVID-19 pandemic, which necessitated consideration that the patient might be at risk for infection with the SARS-CoV-2 virus that causes COVID-19. Institutional protocols and algorithms that pertain to the evaluation of patients at risk for COVID-19 are in a state of rapid change based on information released by regulatory bodies including the CDC and federal and state organizations. These policies and algorithms were followed during the patient's care in the ED.  Some ED evaluations and interventions may be delayed as a result of limited staffing during and the pandemic.*   Note:  This document was prepared using Dragon voice recognition software and may include unintentional dictation errors.    [name] Reining, PA-C 12/17/20 1236    Dionne Bucy, MD 12/17/20 [phone]

## 2020-12-17 NOTE — ED Triage Notes (Signed)
Pt to ED via POV with c/o bilateral knee pain, R worse than L, pt states hx of knee pain, states "they are bone on bone but nobody will fix them".

## 2020-12-17 NOTE — ED Notes (Signed)
Patient declined discharge vital signs. 

## 2021-01-16 ENCOUNTER — Encounter: Payer: Self-pay | Admitting: Emergency Medicine

## 2021-01-16 ENCOUNTER — Inpatient Hospital Stay: Payer: Medicaid Other

## 2021-01-16 ENCOUNTER — Emergency Department: Payer: Medicaid Other

## 2021-01-16 ENCOUNTER — Inpatient Hospital Stay
Admission: EM | Admit: 2021-01-16 | Discharge: 2021-01-18 | DRG: 871 | Disposition: A | Discharge status: Home / Self Care | Payer: Medicaid Other | Attending: Hospitalist | Admitting: Hospitalist

## 2021-01-16 ENCOUNTER — Other Ambulatory Visit: Payer: Self-pay

## 2021-01-16 DIAGNOSIS — M797 Fibromyalgia: Secondary | ICD-10-CM | POA: Diagnosis present

## 2021-01-16 DIAGNOSIS — Z28310 Unvaccinated for covid-19: Secondary | ICD-10-CM

## 2021-01-16 DIAGNOSIS — J441 Chronic obstructive pulmonary disease with (acute) exacerbation: Secondary | ICD-10-CM | POA: Diagnosis present

## 2021-01-16 DIAGNOSIS — Z87891 Personal history of nicotine dependence: Secondary | ICD-10-CM | POA: Diagnosis not present

## 2021-01-16 DIAGNOSIS — Z880 Allergy status to penicillin: Secondary | ICD-10-CM

## 2021-01-16 DIAGNOSIS — G8929 Other chronic pain: Secondary | ICD-10-CM | POA: Diagnosis present

## 2021-01-16 DIAGNOSIS — Z79899 Other long term (current) drug therapy: Secondary | ICD-10-CM | POA: Diagnosis not present

## 2021-01-16 DIAGNOSIS — J189 Pneumonia, unspecified organism: Secondary | ICD-10-CM

## 2021-01-16 DIAGNOSIS — J440 Chronic obstructive pulmonary disease with acute lower respiratory infection: Secondary | ICD-10-CM | POA: Diagnosis present

## 2021-01-16 DIAGNOSIS — Z96642 Presence of left artificial hip joint: Secondary | ICD-10-CM | POA: Diagnosis present

## 2021-01-16 DIAGNOSIS — Z7901 Long term (current) use of anticoagulants: Secondary | ICD-10-CM | POA: Diagnosis not present

## 2021-01-16 DIAGNOSIS — Z6841 Body Mass Index (BMI) 40.0 and over, adult: Secondary | ICD-10-CM

## 2021-01-16 DIAGNOSIS — R0902 Hypoxemia: Secondary | ICD-10-CM

## 2021-01-16 DIAGNOSIS — I69334 Monoplegia of upper limb following cerebral infarction affecting left non-dominant side: Secondary | ICD-10-CM | POA: Diagnosis not present

## 2021-01-16 DIAGNOSIS — Z7982 Long term (current) use of aspirin: Secondary | ICD-10-CM

## 2021-01-16 DIAGNOSIS — I10 Essential (primary) hypertension: Secondary | ICD-10-CM | POA: Diagnosis present

## 2021-01-16 DIAGNOSIS — F32A Depression, unspecified: Secondary | ICD-10-CM | POA: Diagnosis present

## 2021-01-16 DIAGNOSIS — A419 Sepsis, unspecified organism: Secondary | ICD-10-CM

## 2021-01-16 DIAGNOSIS — R0602 Shortness of breath: Secondary | ICD-10-CM

## 2021-01-16 DIAGNOSIS — I69359 Hemiplegia and hemiparesis following cerebral infarction affecting unspecified side: Secondary | ICD-10-CM

## 2021-01-16 DIAGNOSIS — I2510 Atherosclerotic heart disease of native coronary artery without angina pectoris: Secondary | ICD-10-CM | POA: Diagnosis present

## 2021-01-16 DIAGNOSIS — I700 Atherosclerosis of aorta: Secondary | ICD-10-CM | POA: Diagnosis present

## 2021-01-16 DIAGNOSIS — Z20822 Contact with and (suspected) exposure to covid-19: Secondary | ICD-10-CM | POA: Diagnosis present

## 2021-01-16 DIAGNOSIS — I959 Hypotension, unspecified: Secondary | ICD-10-CM | POA: Diagnosis present

## 2021-01-16 DIAGNOSIS — J9600 Acute respiratory failure, unspecified whether with hypoxia or hypercapnia: Secondary | ICD-10-CM

## 2021-01-16 DIAGNOSIS — R6520 Severe sepsis without septic shock: Secondary | ICD-10-CM

## 2021-01-16 DIAGNOSIS — J9601 Acute respiratory failure with hypoxia: Secondary | ICD-10-CM | POA: Diagnosis present

## 2021-01-16 DIAGNOSIS — Z8249 Family history of ischemic heart disease and other diseases of the circulatory system: Secondary | ICD-10-CM

## 2021-01-16 DIAGNOSIS — F331 Major depressive disorder, recurrent, moderate: Secondary | ICD-10-CM | POA: Diagnosis present

## 2021-01-16 HISTORY — DX: Sepsis, unspecified organism: A41.9

## 2021-01-16 HISTORY — DX: Acute respiratory failure, unspecified whether with hypoxia or hypercapnia: J96.00

## 2021-01-16 HISTORY — DX: Pneumonia, unspecified organism: J18.9

## 2021-01-16 LAB — RESP PANEL BY RT-PCR (FLU A&B, COVID) ARPGX2
Influenza A by PCR: NEGATIVE
Influenza B by PCR: NEGATIVE
SARS Coronavirus 2 by RT PCR: NEGATIVE

## 2021-01-16 LAB — COMPREHENSIVE METABOLIC PANEL
ALT: 19 U/L (ref 0–44)
AST: 25 U/L (ref 15–41)
Albumin: 4 g/dL (ref 3.5–5.0)
Alkaline Phosphatase: 68 U/L (ref 38–126)
Anion gap: 11 (ref 5–15)
BUN: 24 mg/dL — ABNORMAL HIGH (ref 6–20)
CO2: 30 mmol/L (ref 22–32)
Calcium: 9.4 mg/dL (ref 8.9–10.3)
Chloride: 96 mmol/L — ABNORMAL LOW (ref 98–111)
Creatinine, Ser: 1.13 mg/dL — ABNORMAL HIGH (ref 0.44–1.00)
GFR, Estimated: 58 mL/min — ABNORMAL LOW (ref >60)
Glucose, Bld: 136 mg/dL — ABNORMAL HIGH (ref 70–99)
Potassium: 3.4 mmol/L — ABNORMAL LOW (ref 3.5–5.1)
Sodium: 137 mmol/L (ref 135–145)
Total Bilirubin: 0.8 mg/dL (ref 0.3–1.2)
Total Protein: 7.8 g/dL (ref 6.5–8.1)

## 2021-01-16 LAB — LIPASE, BLOOD: Lipase: 26 U/L (ref 11–51)

## 2021-01-16 LAB — TROPONIN I (HIGH SENSITIVITY)
Troponin I (High Sensitivity): 6 ng/L (ref <18)
Troponin I (High Sensitivity): 6 ng/L (ref <18)

## 2021-01-16 LAB — BRAIN NATRIURETIC PEPTIDE: B Natriuretic Peptide: 36 pg/mL (ref 0.0–100.0)

## 2021-01-16 LAB — CBC WITH DIFFERENTIAL/PLATELET
Abs Immature Granulocytes: 0.02 10*3/uL (ref 0.00–0.07)
Basophils Absolute: 0 10*3/uL (ref 0.0–0.1)
Basophils Relative: 0 %
Eosinophils Absolute: 0 10*3/uL (ref 0.0–0.5)
Eosinophils Relative: 0 %
HCT: 39.6 % (ref 36.0–46.0)
Hemoglobin: 12.7 g/dL (ref 12.0–15.0)
Immature Granulocytes: 0 %
Lymphocytes Relative: 9 %
Lymphs Abs: 0.7 10*3/uL (ref 0.7–4.0)
MCH: 28.5 pg (ref 26.0–34.0)
MCHC: 32.1 g/dL (ref 30.0–36.0)
MCV: 88.8 fL (ref 80.0–100.0)
Monocytes Absolute: 0.3 10*3/uL (ref 0.1–1.0)
Monocytes Relative: 4 %
Neutro Abs: 6.2 10*3/uL (ref 1.7–7.7)
Neutrophils Relative %: 87 %
Platelets: 302 10*3/uL (ref 150–400)
RBC: 4.46 MIL/uL (ref 3.87–5.11)
RDW: 17.1 % — ABNORMAL HIGH (ref 11.5–15.5)
WBC: 7.3 10*3/uL (ref 4.0–10.5)
nRBC: 0 % (ref 0.0–0.2)

## 2021-01-16 LAB — LACTIC ACID, PLASMA
Lactic Acid, Venous: 0.8 mmol/L (ref 0.5–1.9)
Lactic Acid, Venous: 0.8 mmol/L (ref 0.5–1.9)

## 2021-01-16 LAB — HIV ANTIBODY (ROUTINE TESTING W REFLEX): HIV Screen 4th Generation wRfx: NONREACTIVE

## 2021-01-16 MED ORDER — ESCITALOPRAM OXALATE 10 MG PO TABS
10.0000 mg | ORAL_TABLET | Freq: Every day | ORAL | Status: DC
  Administered 2021-01-16 – 2021-01-18 (×3): 10 mg via ORAL
  Filled 2021-01-16 (×3): qty 1

## 2021-01-16 MED ORDER — METHYLPREDNISOLONE SODIUM SUCC 125 MG IJ SOLR
125.0000 mg | Freq: Once | INTRAMUSCULAR | Status: AC
  Administered 2021-01-16: 125 mg via INTRAVENOUS
  Filled 2021-01-16: qty 2

## 2021-01-16 MED ORDER — TRAMADOL HCL 50 MG PO TABS
50.0000 mg | ORAL_TABLET | Freq: Four times a day (QID) | ORAL | Status: DC | PRN
Start: 2021-01-16 — End: 2021-01-18
  Administered 2021-01-16 – 2021-01-17 (×2): 50 mg via ORAL
  Filled 2021-01-16 (×2): qty 1

## 2021-01-16 MED ORDER — OMEGA 3 1000 MG PO CAPS: 1000.0000 mg | ORAL_CAPSULE | Freq: Every day | ORAL | Status: DC

## 2021-01-16 MED ORDER — ASPIRIN EC 325 MG PO TBEC
325.0000 mg | DELAYED_RELEASE_TABLET | Freq: Every day | ORAL | Status: DC
  Administered 2021-01-16 – 2021-01-18 (×3): 325 mg via ORAL
  Filled 2021-01-16 (×3): qty 1

## 2021-01-16 MED ORDER — LIDOCAINE 5 % EX PTCH
1.0000 | MEDICATED_PATCH | CUTANEOUS | Status: DC
  Administered 2021-01-16 – 2021-01-18 (×3): 1 via TRANSDERMAL
  Filled 2021-01-16 (×3): qty 1

## 2021-01-16 MED ORDER — BACLOFEN 10 MG PO TABS
10.0000 mg | ORAL_TABLET | Freq: Two times a day (BID) | ORAL | Status: DC | PRN
  Filled 2021-01-16: qty 1

## 2021-01-16 MED ORDER — ALBUTEROL SULFATE HFA 108 (90 BASE) MCG/ACT IN AERS
2.0000 | INHALATION_SPRAY | RESPIRATORY_TRACT | Status: DC | PRN
  Filled 2021-01-16: qty 6.7

## 2021-01-16 MED ORDER — BUPROPION HCL ER (XL) 150 MG PO TB24
150.0000 mg | ORAL_TABLET | Freq: Every day | ORAL | Status: DC
  Administered 2021-01-16 – 2021-01-18 (×3): 150 mg via ORAL
  Filled 2021-01-16 (×3): qty 1

## 2021-01-16 MED ORDER — SODIUM CHLORIDE 0.9 % IV SOLN: INTRAVENOUS | Status: DC

## 2021-01-16 MED ORDER — CALCIUM CARBONATE-VITAMIN D 500-200 MG-UNIT PO TABS
1.0000 | ORAL_TABLET | Freq: Every day | ORAL | Status: DC
  Administered 2021-01-16 – 2021-01-18 (×3): 1 via ORAL
  Filled 2021-01-16 (×3): qty 1

## 2021-01-16 MED ORDER — SODIUM CHLORIDE 0.9 % IV SOLN
2.0000 g | INTRAVENOUS | Status: DC
  Administered 2021-01-16 – 2021-01-18 (×3): 2 g via INTRAVENOUS
  Filled 2021-01-16: qty 20
  Filled 2021-01-16: qty 2
  Filled 2021-01-16: qty 20
  Filled 2021-01-16: qty 2

## 2021-01-16 MED ORDER — ATORVASTATIN CALCIUM 20 MG PO TABS
40.0000 mg | ORAL_TABLET | Freq: Every day | ORAL | Status: DC
  Administered 2021-01-16 – 2021-01-17 (×2): 40 mg via ORAL
  Filled 2021-01-16 (×2): qty 2

## 2021-01-16 MED ORDER — PANTOPRAZOLE SODIUM 40 MG IV SOLR
40.0000 mg | INTRAVENOUS | Status: DC
  Administered 2021-01-16 – 2021-01-18 (×3): 40 mg via INTRAVENOUS
  Filled 2021-01-16 (×3): qty 40

## 2021-01-16 MED ORDER — LEVOFLOXACIN IN D5W 750 MG/150ML IV SOLN: 750.0000 mg | Freq: Once | INTRAVENOUS | Status: DC

## 2021-01-16 MED ORDER — SODIUM CHLORIDE 0.9 % IV SOLN
500.0000 mg | INTRAVENOUS | Status: DC
  Administered 2021-01-16 – 2021-01-18 (×3): 500 mg via INTRAVENOUS
  Filled 2021-01-16 (×3): qty 500

## 2021-01-16 MED ORDER — ADULT MULTIVITAMIN W/MINERALS CH
1.0000 | ORAL_TABLET | Freq: Every day | ORAL | Status: DC
  Administered 2021-01-16 – 2021-01-18 (×3): 1 via ORAL
  Filled 2021-01-16 (×3): qty 1

## 2021-01-16 MED ORDER — POTASSIUM CHLORIDE CRYS ER 20 MEQ PO TBCR
40.0000 meq | EXTENDED_RELEASE_TABLET | Freq: Once | ORAL | Status: AC
  Administered 2021-01-16: 40 meq via ORAL
  Filled 2021-01-16: qty 2

## 2021-01-16 MED ORDER — ONDANSETRON HCL 4 MG/2ML IJ SOLN: 4.0000 mg | Freq: Four times a day (QID) | INTRAMUSCULAR | Status: DC | PRN

## 2021-01-16 MED ORDER — PREGABALIN 75 MG PO CAPS
150.0000 mg | ORAL_CAPSULE | Freq: Two times a day (BID) | ORAL | Status: DC
  Administered 2021-01-16 – 2021-01-18 (×5): 150 mg via ORAL
  Filled 2021-01-16 (×3): qty 2
  Filled 2021-01-16: qty 6
  Filled 2021-01-16: qty 2

## 2021-01-16 MED ORDER — CLOPIDOGREL BISULFATE 75 MG PO TABS
75.0000 mg | ORAL_TABLET | Freq: Every day | ORAL | Status: DC
  Administered 2021-01-16 – 2021-01-18 (×3): 75 mg via ORAL
  Filled 2021-01-16 (×4): qty 1

## 2021-01-16 MED ORDER — OMEGA-3-ACID ETHYL ESTERS 1 G PO CAPS
1.0000 g | ORAL_CAPSULE | Freq: Every day | ORAL | Status: DC
  Administered 2021-01-17 – 2021-01-18 (×2): 1 g via ORAL
  Filled 2021-01-16 (×2): qty 1

## 2021-01-16 MED ORDER — ENOXAPARIN SODIUM 60 MG/0.6ML ~~LOC~~ SOLN
0.5000 mg/kg | SUBCUTANEOUS | Status: DC
  Administered 2021-01-16 – 2021-01-17 (×2): 60 mg via SUBCUTANEOUS
  Filled 2021-01-16 (×3): qty 0.6

## 2021-01-16 MED ORDER — ONDANSETRON HCL 4 MG PO TABS: 4.0000 mg | ORAL_TABLET | Freq: Four times a day (QID) | ORAL | Status: DC | PRN

## 2021-01-16 MED ORDER — IPRATROPIUM-ALBUTEROL 0.5-2.5 (3) MG/3ML IN SOLN
3.0000 mL | Freq: Once | RESPIRATORY_TRACT | Status: AC
  Administered 2021-01-16: 3 mL via RESPIRATORY_TRACT
  Filled 2021-01-16: qty 3

## 2021-01-16 NOTE — ED Provider Notes (Signed)
[facility] Emergency Department Provider Note ____________________________________________   Event Date/Time   First MD Initiated Contact with Patient 01/16/21 0701     (approximate)  I have reviewed the triage vital signs and the nursing notes.  HISTORY  Chief Complaint Heartburn and Shortness of Breath   HPI Terri Wood is a 55 y.o. femalewho presents to the ED for evaluation of heartburn and SOB.   Chart review indicates hx obesity, fibromyalgia, HTN and cva on DAPT w plavix.  Patient reports that she is unvaccinated for COVID-19. She wears no oxygen at home. Patient reports she is a lifelong cigarette smoker, just quitting about 6 months ago when she had her stroke.  Patient presents to the ED via EMS from home due to shortness of breath and heartburn sensation.  Patient reports a couple days of increasing shortness of breath, sensation of increased swelling and orthopnea.  She reports waking up this morning with a sensation of heartburn - pressure and burning to her epigastrium/lower chest.  She reports this is constant for the past few hours.  She reports coinciding shortness of breath and chills/chattering teeth this morning.  Denies significant cough, syncopal episodes, lower abdominal pain, dysuria, falls or trauma.  Further reports couple episodes of nonbloody nonbilious emesis this morning without diarrhea.    Past Medical History:  Diagnosis Date  . Anxiety   . Fibromyalgia   . Hypertension   . Osteoarthritis of left hip 2019    Patient Active Problem List   Diagnosis Date Noted  . Acute respiratory failure (HCC) 01/16/2021  . Essential hypertension 08/16/2019  . Fibromyalgia 08/16/2019  . Hyperlipidemia   . Smoker   . Acute CVA (cerebrovascular accident) (HCC) 08/15/2019  . Avascular necrosis of left femoral head (HCC) 04/09/2018    Past Surgical History:  Procedure Laterality Date  . ABDOMINAL SURGERY  1996   gastric bypass;  stapling; surgilite  . PLANTAR FASCIA SURGERY Right 2009  . TOTAL HIP ARTHROPLASTY Left 04/09/2018   Procedure: TOTAL HIP ARTHROPLASTY ANTERIOR APPROACH;  Surgeon: Kennedy BuckerMenz, Michael, MD;  Location: [facility] ORS;  Service: Orthopedics;  Laterality: Left;    Prior to Admission medications   Medication Sig Start Date End Date Taking? Authorizing Provider  Apple Cid Vn-Grn Tea-Bit Or-Cr (APPLE CIDER VINEGAR PLUS) TABS Take 1 tablet by mouth 2 (two) times daily.    [provider]  aspirin EC 325 MG EC tablet Take 1 tablet (325 mg total) by mouth daily. 08/21/19   Noralee Stainhoi, Jennifer, DO  atorvastatin (LIPITOR) 40 MG tablet Take 1 tablet (40 mg total) by mouth daily at 6 PM. 08/21/19   Noralee Stainhoi, Jennifer, DO  baclofen (LIORESAL) 10 MG tablet Take 10 mg by mouth 2 (two) times daily as needed for muscle spasms.    [provider]  buPROPion (WELLBUTRIN XL) 150 MG 24 hr tablet Take 150 mg by mouth daily.    [provider]  Calcium Carbonate-Vit D-Min (CALTRATE 600+D PLUS PO) Take 1 tablet by mouth daily.    [provider]  clonazePAM (KLONOPIN) 0.5 MG tablet Take 0.25-0.5 tablets by mouth 3 (three) times daily as needed for anxiety (or sleep).  12/11/14   [provider]  clopidogrel (PLAVIX) 75 MG tablet Take 1 tablet (75 mg total) by mouth daily. 08/21/19   Noralee Stainhoi, Jennifer, DO  DULoxetine (CYMBALTA) 60 MG capsule Take 60 mg by mouth daily. 03/13/18   [provider]  escitalopram (LEXAPRO) 10 MG tablet Take 10 mg by  mouth daily.    [provider]  fluticasone (FLONASE) 50 MCG/ACT nasal spray Place 1 spray into both nostrils 2 (two) times daily as needed for allergies or rhinitis.  07/11/19   [provider]  lidocaine (LIDODERM) 5 % Place 1 patch onto the skin every 12 (twelve) hours. Remove & Discard patch within 12 hours or as directed by MD 12/17/20 12/17/21  [name] Reining, PA-C  lisinopril (ZESTRIL) 10 MG tablet Take 1 tablet (10 mg total) by mouth  daily. 08/21/19   Noralee Stain, DO  Multiple Vitamins-Minerals (MULTI FOR HER 50+ PO) Take 1 tablet by mouth daily.    [provider]  Omega 3 1000 MG CAPS Take 1,000 mg by mouth daily with breakfast.     [provider]  pregabalin (LYRICA) 150 MG capsule Take 150 mg by mouth 2 (two) times daily. 08/13/19   [provider]  tiZANidine (ZANAFLEX) 4 MG tablet Take 2-4 mg by mouth See admin instructions. Take 2 mg by mouth at bedtime and an additional 2-4 mg once a day as needed for spasms 01/27/18   [provider]  traMADol (ULTRAM) 50 MG tablet Take 50 mg by mouth 2 (two) times daily. 07/14/19   [provider]  traMADol (ULTRAM) 50 MG tablet Take 1 tablet (50 mg total) by mouth every 6 (six) hours as needed for moderate pain. 12/17/20   [name] Reining, PA-C    Allergies Penicillins  Family History  Problem Relation Age of Onset  . Aneurysm Mother   . Heart disease Father     Social History Social History   Tobacco Use  . Smoking status: Current Every Day Smoker    Packs/day: 1.00    Types: Cigarettes  . Smokeless tobacco: Never Used  Vaping Use  . Vaping Use: Never used  Substance Use Topics  . Alcohol use: Yes    Comment: occassionally  . Drug use: Yes    Types: Marijuana    Comment: last week    Review of Systems  Constitutional: Positive for subjective chills. Eyes: No visual changes. ENT: No sore throat. Cardiovascular: Denies chest pain. Respiratory: Positive shortness of breath and orthopnea. Gastrointestinal: Positive for epigastric pain, nausea and emesis.   No diarrhea.  No constipation. Genitourinary: Negative for dysuria. Musculoskeletal: Negative for back pain.  Positive for increased lower extremity swelling. Skin: Negative for rash. Neurological: Negative for headaches, focal weakness or numbness.  ____________________________________________   PHYSICAL EXAM:  VITAL SIGNS: Vitals:   01/16/21 0800  01/16/21 0834  BP: (!) 102/55 (!) 97/57  Pulse: (!) 102 96  Resp: (!) 24 20  Temp:    SpO2: 92% 94%     Constitutional: Alert and oriented.  Obese.  Sitting up in bed and conversational in full sentences. Eyes: Conjunctivae are normal. PERRL. EOMI. Head: Atraumatic. Nose: No congestion/rhinnorhea. Mouth/Throat: Mucous membranes are dry.  Oropharynx non-erythematous. Neck: No stridor. No cervical spine tenderness to palpation. Cardiovascular: Tachycardic rate, regular rhythm. Grossly normal heart sounds.  Good peripheral circulation. Respiratory: Tachypneic to the mid 20s.  No retractions.  Diffuse expiratory wheezes and bibasilar crackles are noted. Gastrointestinal: Soft , nondistended. No CVA tenderness. Mild epigastric tenderness without peritoneal features.  Abdomen is otherwise benign. Musculoskeletal: No lower extremity tenderness.  No joint effusions. No signs of acute trauma. Pitting edema to bilateral ankles up to mid shin symmetrically without overlying skin changes or signs of trauma. Neurologic:  Normal speech and language. No gross focal neurologic deficits  are appreciated. No gait instability noted. Skin:  Skin is warm, dry and intact. No rash noted. Psychiatric: Mood and affect are normal. Speech and behavior are normal. ____________________________________________   LABS (all labs ordered are listed, but only abnormal results are displayed)  Labs Reviewed  COMPREHENSIVE METABOLIC PANEL - Abnormal; Notable for the following components:      Result Value   Potassium 3.4 (*)    Chloride 96 (*)    Glucose, Bld 136 (*)    BUN 24 (*)    Creatinine, Ser 1.13 (*)    GFR, Estimated 58 (*)    All other components within normal limits  CBC WITH DIFFERENTIAL/PLATELET - Abnormal; Notable for the following components:   RDW 17.1 (*)    All other components within normal limits  RESP PANEL BY RT-PCR (FLU A&B, COVID) ARPGX2  CULTURE, BLOOD (ROUTINE X 2)  CULTURE, BLOOD  (ROUTINE X 2)  BRAIN NATRIURETIC PEPTIDE  LIPASE, BLOOD  TROPONIN I (HIGH SENSITIVITY)  TROPONIN I (HIGH SENSITIVITY)   ____________________________________________  12 Lead EKG  Sinus rhythm rate of 110 bpm.  Suspect limb lead reversal.  No evidence of acute ischemia. ____________________________________________  RADIOLOGY  ED MD interpretation: 1 view CXR reviewed by me with patchy multifocal infiltrates  Official radiology report(s): DG Chest Portable 1 View  Result Date: 01/16/2021 CLINICAL DATA:  55 year old female with shortness of breath, hypoxia on room air. Respiratory testing for COVID-19 pending. EXAM: PORTABLE CHEST 1 VIEW COMPARISON:  Chest radiographs 03/13/2016 and earlier. FINDINGS: Portable AP upright view at 0726 hours. Mildly lower lung volumes, but increased interstitial and indistinct basilar predominant pulmonary opacity is out of proportion to the lower volumes. Mediastinal contours appear stable and within normal limits. Visualized tracheal air column is within normal limits. No pneumothorax, consolidation or pleural effusion. No acute osseous abnormality identified. Paucity of bowel gas in the upper abdomen. IMPRESSION: Asymmetric patchy and interstitial pulmonary opacity maximal at the left lung base. Appearance most suspicious for acute viral/atypical respiratory infection. Electronically Signed   By: Odessa Fleming M.D.   On: 01/16/2021 07:47    ____________________________________________   PROCEDURES and INTERVENTIONS  Procedure(s) performed (including Critical Care):  .1-3 Lead EKG Interpretation Performed by: Delton Prairie, MD Authorized by: Delton Prairie, MD     Interpretation: abnormal     ECG rate:  106   ECG rate assessment: tachycardic     Rhythm: sinus tachycardia     Ectopy: none     Conduction: normal   .Critical Care Performed by: Delton Prairie, MD Authorized by: Delton Prairie, MD   Critical care provider statement:    Critical care time  (minutes):  35   Critical care was necessary to treat or prevent imminent or life-threatening deterioration of the following conditions:  Respiratory failure   Critical care was time spent personally by me on the following activities:  Discussions with consultants, evaluation of patient's response to treatment, examination of patient, ordering and performing treatments and interventions, ordering and review of laboratory studies, ordering and review of radiographic studies, pulse oximetry, re-evaluation of patient's condition, obtaining history from patient or surrogate and review of old charts    Medications  cefTRIAXone (ROCEPHIN) 2 g in sodium chloride 0.9 % 100 mL IVPB (has no administration in time range)  azithromycin (ZITHROMAX) 500 mg in sodium chloride 0.9 % 250 mL IVPB (has no administration in time range)  methylPREDNISolone sodium succinate (SOLU-MEDROL) 125 mg/2 mL injection 125 mg (125 mg Intravenous Given 01/16/21 0744)  ipratropium-albuterol (DUONEB) 0.5-2.5 (3) MG/3ML nebulizer solution 3 mL (3 mLs Nebulization Given 01/16/21 0744)    ____________________________________________   MDM / ED COURSE   Unvaccinated and previous smoker presents to the ED with acute hypoxic respiratory failure, with evidence of COPD exacerbation and multifocal pneumonia, requiring medical admission.  Patient presents hypoxic to the 80s necessitating 4 L nasal cannula, but remains hemodynamically stable and afebrile.  Exam with diffuse expiratory wheezes and tachypnea suggestive of COPD exacerbation in the setting of her lifelong smoking history.  She has no neurovascular deficits, distress, signs of trauma.  She does have some signs of volume overload with pitting edema to her bilateral lower extremities, and her symptoms of orthopnea or further suggestive of a CHF component.  Her CXR has patchy multifocal opacities reminiscent of COVID-19, and particularly because she has been unvaccinated, but she tests  negative on PCR swab.  She does not meet sepsis criteria, but due to her negative swab and symptoms of chills and tachycardia, CAP coverage provided with Rocephin and azithromycin after blood cultures were drawn.  She remains on 4 L nasal cannula, but with improved symptoms after breathing treatments.  We will admit to hospitalist medicine for further work-up and management  Clinical Course as of 01/16/21 0930  Sun Jan 16, 2021  9629 Reassessed.  Patient reports improved symptoms after breathing treatment.  Remains tachycardic, although improved, and on nasal cannula [DS]    Clinical Course User Index [DS] Delton Prairie, MD    ____________________________________________   FINAL CLINICAL IMPRESSION(S) / ED DIAGNOSES  Final diagnoses:  Hypoxia  SOB (shortness of breath)  COPD with acute exacerbation [facility])     ED Discharge Orders    None           Note:  This document was prepared using Dragon voice recognition software and may include unintentional dictation errors.   Delton Prairie, MD 01/16/21 [phone]

## 2021-01-16 NOTE — ED Notes (Signed)
Meal tray at bedside.  

## 2021-01-16 NOTE — Progress Notes (Signed)
PHARMACIST - PHYSICIAN COMMUNICATION  CONCERNING:  Enoxaparin (Lovenox) for DVT Prophylaxis    RECOMMENDATION: Patient was prescribed enoxaprin 40mg  q24 hours for VTE prophylaxis.   Filed Weights   01/16/21 0708  Weight: 122 kg (269 lb)    Body mass index is 50.83 kg/m.  Estimated Creatinine Clearance: 69.6 mL/min (A) (by C-G formula based on SCr of 1.13 mg/dL (H)).   Based on [facility] policy patient is candidate for enoxaparin 0.5mg /kg TBW SQ every 24 hours based on BMI being >30.  DESCRIPTION: Pharmacy has adjusted enoxaparin dose per [facility] policy.  Patient is now receiving enoxaparin 60 mg every 24 hours   [facility], PharmD Pharmacy Resident  01/16/2021 10:48 AM

## 2021-01-16 NOTE — H&P (Signed)
History and Physical    Terri Wood MOL:[id number] DOB: 05-May-1966 DOA: 01/16/2021  PCP: Miki Kins, FNP   Patient coming from: Home  I have personally briefly reviewed patient's old medical records in [facility]  Chief Complaint: Chest discomfort/shortness of breath  HPI: Terri Wood is a 55 y.o. 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 Patient states that she had central chest discomfort 1 day prior to her admission which she attributed to reflux.  Chest discomfort is associated with emesis.  Patient states that she took Tums and Prilosec without any relief in her symptoms. Shortness of breath is associated with chills and cough that is nonproductive.  She also complains of a sore throat and has had occasional headaches.  She has bilateral lower extremity swelling. Patient is unvaccinated against the COVID-19 virus and denies having any sick contacts. She denies having any palpitations, no diaphoresis, no abdominal pain, no constipation, no diarrhea, no urinary frequency, no nocturia, no dysuria, no blurred vision, no dizziness, no lightheadedness. Labs show sodium 137, potassium 3.4, chloride 96, bicarb 30, glucose 136, BUN 24, creatinine 1.13, calcium 9.4 alkaline phosphatase 68, albumin 4.0, lipase 26, AST 25, ALT 19, total protein 7.8, BNP 36, troponin VI, white count 7.3 with predominant neutrophils, hemoglobin 12.7, hematocrit 36, MCV 88, RDW 17.1, platelet count 302 Chest x-ray reviewed by me shows asymmetric patchy and interstitial pulmonary opacity maximal at the left lung base. Appearance most suspicious for acute viral/atypical respiratory infection. CT scan of the chest without contrast shows moderate patchy  bilateral airspace process most prominent over the left lower lobe compatible with multifocal pneumonia. Tiny amount of left pleural fluid. Aortic atherosclerosis. Atherosclerotic coronary artery disease. Twelve-lead EKG reviewed by me shows sinus tachycardia   ED Course: Patient is a 55 year old female with a history of CVA with residual left arm weakness on DAPT who presents to the ER for evaluation of sudden onset shortness of breath associated with chills and a nonproductive cough.  Patient also had symptoms of central chest discomfort which she attributed to reflux associated with emesis.  Per EMS she had room air pulse oximetry of 84% in the field and was placed on 4 L of oxygen via nasal cannula to maintain pulse oximetry greater than 92%.  Patient received a dose of Rocephin and Zithromax as well as Solu-Medrol in the ER and will be admitted to the hospital for further evaluation. Review of Systems: As per HPI otherwise all other systems reviewed and negative.    Past Medical History:  Diagnosis Date  . Anxiety   . Fibromyalgia   . Hypertension   . Osteoarthritis of left hip 2019    Past Surgical History:  Procedure Laterality Date  . ABDOMINAL SURGERY  1996   gastric bypass; stapling; surgilite  . PLANTAR FASCIA SURGERY Right 2009  . TOTAL HIP ARTHROPLASTY Left 04/09/2018   Procedure: TOTAL HIP ARTHROPLASTY ANTERIOR APPROACH;  Surgeon: Kennedy Bucker, MD;  Location: [facility] ORS;  Service: Orthopedics;  Laterality: Left;     reports that she has been smoking cigarettes. She has been smoking about 1.00 pack per day. She has never used smokeless tobacco. She reports current alcohol use. She reports current drug use. Drug: Marijuana.  Allergies  Allergen Reactions  .  Penicillins Hives    Did it involve swelling of the face/tongue/throat, SOB, or low BP? Yes Did it involve sudden or severe rash/hives, skin peeling, or any reaction on the inside of your mouth or nose? No Did you need  to seek medical attention at a hospital or doctor's office? No When did it last happen? Within the past 10 years If all above answers are "NO", may proceed with cephalosporin use.     Family History  Problem Relation Age of Onset  . Aneurysm Mother   . Heart disease Father       Prior to Admission medications   Medication Sig Start Date End Date Taking? Authorizing Provider  albuterol (VENTOLIN HFA) 108 (90 Base) MCG/ACT inhaler Inhale 2 puffs into the lungs every 4 (four) hours as needed for wheezing.   Yes [provider]  amLODipine (NORVASC) 10 MG tablet Take 1 tablet by mouth daily. 08/14/20  Yes [provider]  Apple Cid Vn-Grn Tea-Bit Or-Cr (APPLE CIDER VINEGAR PLUS) TABS Take 1 tablet by mouth 2 (two) times daily.   Yes [provider]  atorvastatin (LIPITOR) 40 MG tablet Take 1 tablet (40 mg total) by mouth daily at 6 PM. 08/21/19  Yes Noralee Stainhoi, Jennifer, DO  baclofen (LIORESAL) 10 MG tablet Take 10 mg by mouth 2 (two) times daily as needed for muscle spasms.   Yes [provider]  buPROPion (WELLBUTRIN XL) 150 MG 24 hr tablet Take 150 mg by mouth daily.   Yes [provider]  Calcium Carbonate-Vit D-Min (CALTRATE 600+D PLUS PO) Take 1 tablet by mouth daily.   Yes [provider]  escitalopram (LEXAPRO) 10 MG tablet Take 10 mg by mouth daily.   Yes [provider]  lidocaine (LIDODERM) 5 % Place 1 patch onto the skin every 12 (twelve) hours. Remove & Discard patch within 12 hours or as directed by MD 12/17/20 12/17/21 Yes [name] ReiningSmith, Ronald K, PA-C  lisinopril (ZESTRIL) 10 MG tablet Take 1 tablet (10 mg total) by mouth daily. 08/21/19  Yes Noralee Stainhoi, Jennifer, DO  Multiple Vitamins-Minerals (MULTI FOR HER 50+ PO) Take 1 tablet by mouth daily.   Yes [provider]  Omega 3 1000 MG CAPS Take 1,000 mg by mouth daily with breakfast.    Yes [provider]  pregabalin (LYRICA) 150 MG capsule Take 150 mg by mouth 2 (two)  times daily. 08/13/19  Yes [provider]  traMADol (ULTRAM) 50 MG tablet Take 50 mg by mouth 2 (two) times daily. 07/14/19  Yes [provider]  aspirin EC 325 MG EC tablet Take 1 tablet (325 mg total) by mouth daily. 08/21/19   Noralee Stainhoi, Jennifer, DO  clonazePAM (KLONOPIN) 0.5 MG tablet Take 0.25-0.5 tablets by mouth 3 (three) times daily as needed for anxiety (or sleep).  Patient not taking: Reported on 01/16/2021 12/11/14   [provider]  clopidogrel (PLAVIX) 75 MG tablet Take 1 tablet (75 mg total) by mouth daily. 08/21/19   Noralee Stainhoi, Jennifer, DO  DULoxetine (CYMBALTA) 60 MG capsule Take 60 mg by mouth daily. Patient not taking: Reported on 01/16/2021 03/13/18   [provider]  fluticasone (FLONASE) 50 MCG/ACT nasal spray Place 1 spray into both nostrils 2 (two) times daily as needed for allergies or rhinitis.  Patient not taking: Reported on 01/16/2021 07/11/19   [provider]  tiZANidine (ZANAFLEX) 4 MG tablet Take 2-4 mg by mouth See admin instructions. Take 2 mg by mouth at bedtime and an additional 2-4  mg once a day as needed for spasms Patient not taking: Reported on 01/16/2021 01/27/18   [provider]  traMADol (ULTRAM) 50 MG tablet Take 1 tablet (50 mg total) by mouth every 6 (six) hours as needed for moderate pain. 12/17/20   [name] Reining, PA-C    Physical Exam: Vitals:   01/16/21 0730 01/16/21 0800 01/16/21 0834 01/16/21 1030  BP: 96/65 (!) 102/55 (!) 97/57 115/85  Pulse: (!) 105 (!) 102 96 97  Resp: (!) 23 (!) 24 20 16   Temp:      SpO2: 96% 92% 94% 94%  Weight:      Height:         Vitals:   01/16/21 0730 01/16/21 0800 01/16/21 0834 01/16/21 1030  BP: 96/65 (!) 102/55 (!) 97/57 115/85  Pulse: (!) 105 (!) 102 96 97  Resp: (!) 23 (!) 24 20 16   Temp:      SpO2: 96% 92% 94% 94%  Weight:      Height:          Constitutional: Alert  and oriented x 3 .  Appears comfortable on 4 L of oxygen via nasal cannula.  Not in any  distress.  HEENT:      Head: Normocephalic and atraumatic.         Eyes: PERLA, EOMI, Conjunctivae are normal. Sclera is non-icteric.       Mouth/Throat: Mucous membranes are moist.       Neck: Supple with no signs of meningismus. Cardiovascular:  Tachycardia. No murmurs, gallops, or rubs. 2+ symmetrical distal pulses are present . No JVD. 1+LE edema Respiratory:  Tachypnea.scattered rhonchi in both lung fields   Gastrointestinal: Soft, non tender, and non distended with positive bowel sounds.  Genitourinary: No CVA tenderness. Musculoskeletal: Nontender with normal range of motion in all extremities. No cyanosis, or erythema of extremities. Neurologic:  Face is symmetric. Moving all extremities.  Left arm hemiparesis from a prior stroke. Skin: Skin is warm, dry.  No rash or ulcers Psychiatric: Mood and affect are normal   Labs on Admission: I have personally reviewed following labs and imaging studies  CBC: Recent Labs  Lab 01/16/21 0726  WBC 7.3  NEUTROABS 6.2  HGB 12.7  HCT 39.6  MCV 88.8  PLT 302   Basic Metabolic Panel: Recent Labs  Lab 01/16/21 0726  NA 137  K 3.4*  CL 96*  CO2 30  GLUCOSE 136*  BUN 24*  CREATININE 1.13*  CALCIUM 9.4   GFR: Estimated Creatinine Clearance: 69.6 mL/min (A) (by C-G formula based on SCr of 1.13 mg/dL (H)). Liver Function Tests: Recent Labs  Lab 01/16/21 0726  AST 25  ALT 19  ALKPHOS 68  BILITOT 0.8  PROT 7.8  ALBUMIN 4.0   Recent Labs  Lab 01/16/21 0726  LIPASE 26   No results for input(s): AMMONIA in the last 168 hours. Coagulation Profile: No results for input(s): INR, PROTIME in the last 168 hours. Cardiac Enzymes: No results for input(s): CKTOTAL, CKMB, CKMBINDEX, TROPONINI in the last 168 hours. BNP (last 3 results) No results for input(s): PROBNP in the last 8760 hours. HbA1C: No results for input(s): HGBA1C in the last 72 hours. CBG: No results for input(s): GLUCAP in the last 168 hours. Lipid  Profile: No results for input(s): CHOL, HDL, LDLCALC, TRIG, CHOLHDL, LDLDIRECT in the last 72 hours. Thyroid Function Tests: No results for input(s): TSH, T4TOTAL, FREET4, T3FREE, THYROIDAB in the last 72 hours. Anemia Panel: No results for  input(s): VITAMINB12, FOLATE, FERRITIN, TIBC, IRON, RETICCTPCT in the last 72 hours. Urine analysis:    Component Value Date/Time   COLORURINE YELLOW (A) 04/03/2018 1435   APPEARANCEUR CLEAR (A) 04/03/2018 1435   LABSPEC 1.012 04/03/2018 1435   PHURINE 6.0 04/03/2018 1435   GLUCOSEU NEGATIVE 04/03/2018 1435   HGBUR MODERATE (A) 04/03/2018 1435   BILIRUBINUR NEGATIVE 04/03/2018 1435   KETONESUR NEGATIVE 04/03/2018 1435   PROTEINUR NEGATIVE 04/03/2018 1435   NITRITE NEGATIVE 04/03/2018 1435   LEUKOCYTESUR NEGATIVE 04/03/2018 1435    Radiological Exams on Admission: CT CHEST WO CONTRAST  Result Date: 01/16/2021 CLINICAL DATA:  Possible pneumonia. Effusion or abscess suspected. Shortness of breath and hypoxia. EXAM: CT CHEST WITHOUT CONTRAST TECHNIQUE: Multidetector CT imaging of the chest was performed following the standard protocol without IV contrast. COMPARISON:  Chest x-ray 01/16/2021. FINDINGS: Cardiovascular: Heart is normal size. Calcified plaque over the 3 vessel coronary arteries. Thoracic aorta is normal in caliber. Remaining vascular structures are unremarkable. Mediastinum/Nodes: No significant mediastinal or hilar adenopathy. Remaining mediastinal structures are unremarkable. Lungs/Pleura: Lungs are adequately inflated demonstrate moderate patchy bilateral airspace consolidation worse over the left lower lobe compatible multifocal pneumonia. Tiny amount of left pleural fluid present. Airways are unremarkable. Upper Abdomen: Postsurgical change over the stomach. Minimal calcified plaque over the abdominal aorta. No acute findings. Musculoskeletal: No focal abnormality IMPRESSION: 1. Moderate patchy bilateral airspace process most prominent over  the left lower lobe compatible with multifocal pneumonia. Tiny amount of left pleural fluid. 2. Aortic atherosclerosis. Atherosclerotic coronary artery disease. Aortic Atherosclerosis (ICD10-I70.0). Electronically Signed   By: Elberta Fortis M.D.   On: 01/16/2021 10:06   DG Chest Portable 1 View  Result Date: 01/16/2021 CLINICAL DATA:  55 year old female with shortness of breath, hypoxia on room air. Respiratory testing for COVID-19 pending. EXAM: PORTABLE CHEST 1 VIEW COMPARISON:  Chest radiographs 03/13/2016 and earlier. FINDINGS: Portable AP upright view at 0726 hours. Mildly lower lung volumes, but increased interstitial and indistinct basilar predominant pulmonary opacity is out of proportion to the lower volumes. Mediastinal contours appear stable and within normal limits. Visualized tracheal air column is within normal limits. No pneumothorax, consolidation or pleural effusion. No acute osseous abnormality identified. Paucity of bowel gas in the upper abdomen. IMPRESSION: Asymmetric patchy and interstitial pulmonary opacity maximal at the left lung base. Appearance most suspicious for acute viral/atypical respiratory infection. Electronically Signed   By: Odessa Fleming M.D.   On: 01/16/2021 07:47     Assessment/Plan Principal Problem:   Acute respiratory failure (HCC) Active Problems:   Essential hypertension   Fibromyalgia   Community acquired pneumonia   CVA, old, hemiparesis (HCC)   Morbid obesity (HCC)   Sepsis (HCC)   Depression    Acute respiratory failure (POA) Patient presents to the ER for evaluation of sudden onset shortness of breath associated with a cough and chills. She had room air pulse oximetry of 84% associated with tachypnea, pulse oximetry improved to 92% following oxygen supplementation at 4 L. Acute respiratory failure appears to be secondary to community acquired pneumonia We will attempt to wean patient off oxygen once her acute respiratory issues have  resolved.    Sepsis from community-acquired pneumonia (POA) As evidenced by tachycardia, tachypnea, imaging suggestive of multifocal pneumonia as well as acute hypoxic respiratory failure. Patient had room air pulse oximetry of 64% with associated tachypnea and is currently on 4 L of oxygen to maintain pulse oximetry greater than 92%. We will place patient on Rocephin and Zithromax  Aggressive IV fluid resuscitation Follow-up results of lactic acid Concern for possible aspiration pneumonia we will request speech therapy consult for swallow function evaluation.    Hypertension Hold amlodipine and lisinopril due to relative hypotension   History of CVA with residual left arm hemiparesis Continue statins, aspirin and Plavix    Depression Continue Lexapro and bupropion   Morbid obesity (BMI 50.8) Complicates overall prognosis and care Lifestyle modification and exercise has been discussed with patient in detail   DVT prophylaxis: Lovenox Code Status: full code Family Communication: Greater than 50% of time was spent discussing patient's condition and plan of care with her at the bedside.  All questions and concerns have been addressed.  She verbalizes understanding and agrees with the plan. Disposition Plan: Back to previous home environment Consults called: none Status: At the time of admission, it appears that appropriate admission status for this patient is inpatient.   This is judged to be reasonable and necessary in order to provide the required intensity of service to ensure the patient's safety given the presenting symptoms, physical exam findings, and initial radiographic and laboratory data in the context of the comorbid conditions. Patient requires inpatient status due to high intensity of service, high risk for further deterioration and high frequency of surveillance required.    Lucile Shutters MD [facility]     01/16/2021, 10:46 AM

## 2021-01-16 NOTE — ED Triage Notes (Signed)
EMS brought in from home for heartburn/acid reflux that started yesterday after she started a new diet. Denies CP. Complaining of SOB. Initial SPO2 84% on RA.

## 2021-01-17 LAB — BASIC METABOLIC PANEL
Anion gap: 5 (ref 5–15)
BUN: 19 mg/dL (ref 6–20)
CO2: 28 mmol/L (ref 22–32)
Calcium: 9.2 mg/dL (ref 8.9–10.3)
Chloride: 104 mmol/L (ref 98–111)
Creatinine, Ser: 0.74 mg/dL (ref 0.44–1.00)
GFR, Estimated: >60 mL/min (ref >60)
Glucose, Bld: 119 mg/dL — ABNORMAL HIGH (ref 70–99)
Potassium: 3.9 mmol/L (ref 3.5–5.1)
Sodium: 137 mmol/L (ref 135–145)

## 2021-01-17 LAB — CBC
HCT: 32.3 % — ABNORMAL LOW (ref 36.0–46.0)
Hemoglobin: 10.5 g/dL — ABNORMAL LOW (ref 12.0–15.0)
MCH: 29 pg (ref 26.0–34.0)
MCHC: 32.5 g/dL (ref 30.0–36.0)
MCV: 89.2 fL (ref 80.0–100.0)
Platelets: 283 10*3/uL (ref 150–400)
RBC: 3.62 MIL/uL — ABNORMAL LOW (ref 3.87–5.11)
RDW: 17.2 % — ABNORMAL HIGH (ref 11.5–15.5)
WBC: 15.5 10*3/uL — ABNORMAL HIGH (ref 4.0–10.5)
nRBC: 0 % (ref 0.0–0.2)

## 2021-01-17 LAB — PROCALCITONIN: Procalcitonin: 1.19 ng/mL

## 2021-01-17 LAB — PROTIME-INR
INR: 1.2 (ref 0.8–1.2)
Prothrombin Time: 14.5 seconds (ref 11.4–15.2)

## 2021-01-17 LAB — CORTISOL-AM, BLOOD: Cortisol - AM: 1.7 ug/dL — ABNORMAL LOW (ref 6.7–22.6)

## 2021-01-17 NOTE — Progress Notes (Signed)
PROGRESS NOTE    Terri Wood  PPJ:[id number] DOB: 1966-02-28 DOA: 01/16/2021 PCP: Terri Kins, FNP  102A/102A-AA   Assessment & Plan:   Principal Problem:   Acute respiratory failure [facility]) Active Problems:   Essential hypertension   Fibromyalgia   Community acquired pneumonia   CVA, old, hemiparesis (HCC)   Morbid obesity (HCC)   Sepsis (HCC)   Depression   Terri Wood is a 55 y.o. 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  Acute hypoxic respiratory failure (POA) She had room air pulse oximetry of 84% associated with tachypnea, pulse oximetry improved to 92% following oxygen supplementation at 4 L. Acute respiratory failure appears to be secondary to community acquired pneumonia --already weaned down to RA this morning --treat CAP  Sepsis from community-acquired pneumonia (POA) As evidenced by tachycardia, tachypnea, imaging suggestive of multifocal pneumonia as well as acute hypoxic respiratory failure. --started on Rocephin and Zithromax on admission and IVF resuscitation Plan: --cont azithromycin and ceftriaxone  Hypertension --amlodipine and lisinopril held on admission due to relative hypotension --cont to hold  History of CVA with residual left arm hemiparesis --cont ASA, statin and plavix  Depression Continue Lexapro and bupropion  Morbid obesity (BMI 50.8) Complicates overall prognosis and care Lifestyle modification and exercise has been discussed with patient in detail   DVT prophylaxis: Lovenox SQ Code Status: Full code  Family Communication: friend updated at bedside today Level of care: Med-Surg Dispo:   The patient is from: home Anticipated d/c is to: home Anticipated d/c  date is: 1-2 days Patient currently is not medically ready to d/c due to: CAP on IV abx   Subjective and Interval History:  Pt reported feeling much better.  Breathing improved.  Already back on room air.  Transfer out of PCU today.   Objective: Vitals:   01/17/21 0822 01/17/21 1050 01/17/21 1335 01/17/21 1559  BP: (!) 104/59 (!) 125/94 (!) 106/53 (!) 98/58  Pulse: 81 72 75 70  Resp:  18 18 16   Temp:  98.7 F (37.1 C) 98 F (36.7 C) 97.9 F (36.6 C)  TempSrc:  Oral Oral Oral  SpO2: 96% 99% 100% 94%  Weight:      Height:        Intake/Output Summary (Last 24 hours) at 01/17/2021 1758 Last data filed at 01/17/2021 1200 Gross per 24 hour  Intake 2199.78 ml  Output 800 ml  Net 1399.78 ml   Filed Weights   01/16/21 0708 01/16/21 1648  Weight: 122 kg 121.7 kg    Examination:   Constitutional: NAD, AAOx3 HEENT: conjunctivae and lids normal, EOMI CV: No cyanosis.   RESP: normal respiratory effort, on RA Extremities: No effusions, edema in BLE SKIN: warm, dry Neuro: II - XII grossly intact.   Psych: Normal mood and affect.  Appropriate judgement and reason   Data Reviewed: I have personally reviewed following labs and imaging studies  CBC: Recent Labs  Lab 01/16/21 0726 01/17/21 0548  WBC 7.3 15.5*  NEUTROABS 6.2  --   HGB 12.7 10.5*  HCT 39.6 32.3*  MCV 88.8 89.2  PLT 302 283   Basic Metabolic Panel: Recent Labs  Lab 01/16/21 0726 01/17/21 0548  NA 137 137  K 3.4* 3.9  CL 96* 104  CO2 30 28  GLUCOSE 136* 119*  BUN 24* 19  CREATININE 1.13* 0.74  CALCIUM 9.4 9.2   GFR: Estimated Creatinine Clearance: 96.5 mL/min (by C-G formula based on SCr of 0.74 mg/dL). Liver Function Tests: Recent Labs  Lab 01/16/21 0726  AST 25  ALT 19  ALKPHOS 68  BILITOT 0.8  PROT 7.8  ALBUMIN 4.0   Recent Labs  Lab 01/16/21 0726  LIPASE 26   No results for input(s): AMMONIA in the last 168 hours. Coagulation Profile: Recent Labs  Lab 01/17/21 0548  INR 1.2    Cardiac Enzymes: No results for input(s): CKTOTAL, CKMB, CKMBINDEX, TROPONINI in the last 168 hours. BNP (last 3 results) No results for input(s): PROBNP in the last 8760 hours. HbA1C: No results for input(s): HGBA1C in the last 72 hours. CBG: No results for input(s): GLUCAP in the last 168 hours. Lipid Profile: No results for input(s): CHOL, HDL, LDLCALC, TRIG, CHOLHDL, LDLDIRECT in the last 72 hours. Thyroid Function Tests: No results for input(s): TSH, T4TOTAL, FREET4, T3FREE, THYROIDAB in the last 72 hours. Anemia Panel: No results for input(s): VITAMINB12, FOLATE, FERRITIN, TIBC, IRON, RETICCTPCT in the last 72 hours. Sepsis Labs: Recent Labs  Lab 01/16/21 1029 01/16/21 1320 01/17/21 0548  PROCALCITON  --   --  1.19  LATICACIDVEN 0.8 0.8  --     Recent Results (from the past 240 hour(s))  Resp Panel by RT-PCR (Flu A&B, Covid) Nasopharyngeal Swab     Status: None   Collection Time: 01/16/21  7:25 AM   Specimen: Nasopharyngeal Swab; Nasopharyngeal(NP) swabs in vial transport medium  Result Value Ref Range Status   SARS Coronavirus 2 by RT PCR NEGATIVE NEGATIVE Final    Comment: (NOTE) SARS-CoV-2 target nucleic acids are NOT DETECTED.  The SARS-CoV-2 RNA is generally detectable in upper respiratory specimens during the acute phase of infection. The lowest concentration of SARS-CoV-2 viral copies this assay can detect is 138 copies/mL. A negative result does not preclude SARS-Cov-2 infection and should not be used as the sole basis for treatment or other patient management decisions. A negative result may occur with  improper specimen collection/handling, submission of specimen other than nasopharyngeal swab, presence of viral mutation(s) within the areas targeted by this assay, and inadequate number of viral copies(<138 copies/mL). A negative result must be combined with clinical observations, patient history, and epidemiological information. The expected result is  Negative.  Fact Sheet for Patients:  BloggerCourse.comhttps://www.fda.gov/media/152166/download  Fact Sheet for Healthcare Providers:  SeriousBroker.ithttps://www.fda.gov/media/152162/download  This test is no t yet approved or cleared by the [place] FDA and  has been authorized for detection and/or diagnosis of SARS-CoV-2 by FDA under an Emergency Use Authorization (EUA). This EUA will remain  in effect (meaning this test can be used) for the duration of the COVID-19 declaration under Section 564(b)(1) of the Act, 21 U.S.C.section 360bbb-3(b)(1), unless the authorization is terminated  or revoked sooner.       Influenza A by PCR NEGATIVE NEGATIVE Final   Influenza B by PCR NEGATIVE NEGATIVE Final    Comment: (NOTE) The Xpert Xpress SARS-CoV-2/FLU/RSV plus assay is intended as an aid in the diagnosis of influenza from Nasopharyngeal swab specimens and should not be used as a sole basis for treatment. Nasal washings and aspirates are unacceptable for Xpert Xpress SARS-CoV-2/FLU/RSV testing.  Fact Sheet for Patients: BloggerCourse.comhttps://www.fda.gov/media/152166/download  Fact Sheet for Healthcare Providers: SeriousBroker.ithttps://www.fda.gov/media/152162/download  This test is not yet approved or cleared by the [place] FDA and has been  authorized for detection and/or diagnosis of SARS-CoV-2 by FDA under an Emergency Use Authorization (EUA). This EUA will remain in effect (meaning this test can be used) for the duration of the COVID-19 declaration under Section 564(b)(1) of the Act, 21 U.S.C. section 360bbb-3(b)(1), unless the authorization is terminated or revoked.  Performed at [facility], [address] [place], [place] [address]   Culture, blood (routine x 2) Call MD if unable to obtain prior to antibiotics being given     Status: None (Preliminary result)   Collection Time: 01/16/21  9:02 AM   Specimen: BLOOD  Result Value Ref Range Status   Specimen Description BLOOD RIGHT ANTECUBITAL  Final    Special Requests   Final    BOTTLES DRAWN AEROBIC AND ANAEROBIC Blood Culture results may not be optimal due to an excessive volume of blood received in culture bottles   Culture   Final    NO GROWTH < 24 HOURS Performed at [facility], [address], [place], [place] [address]    Report Status PENDING  Incomplete  Culture, blood (routine x 2) Call MD if unable to obtain prior to antibiotics being given     Status: None (Preliminary result)   Collection Time: 01/16/21  9:02 AM   Specimen: BLOOD  Result Value Ref Range Status   Specimen Description BLOOD LEFT ANTECUBITAL  Final   Special Requests   Final    BOTTLES DRAWN AEROBIC AND ANAEROBIC Blood Culture results may not be optimal due to an excessive volume of blood received in culture bottles   Culture   Final    NO GROWTH < 24 HOURS Performed at [facility], [address] [place], [place] [address]    Report Status PENDING  Incomplete      Radiology Studies: CT CHEST WO CONTRAST  Result Date: 01/16/2021 CLINICAL DATA:  Possible pneumonia. Effusion or abscess suspected. Shortness of breath and hypoxia. EXAM: CT CHEST WITHOUT CONTRAST TECHNIQUE: Multidetector CT imaging of the chest was performed following the standard protocol without IV contrast. COMPARISON:  Chest x-ray 01/16/2021. FINDINGS: Cardiovascular: Heart is normal size. Calcified plaque over the 3 vessel coronary arteries. Thoracic aorta is normal in caliber. Remaining vascular structures are unremarkable. Mediastinum/Nodes: No significant mediastinal or hilar adenopathy. Remaining mediastinal structures are unremarkable. Lungs/Pleura: Lungs are adequately inflated demonstrate moderate patchy bilateral airspace consolidation worse over the left lower lobe compatible multifocal pneumonia. Tiny amount of left pleural fluid present. Airways are unremarkable. Upper Abdomen: Postsurgical change over the stomach. Minimal calcified plaque over the abdominal  aorta. No acute findings. Musculoskeletal: No focal abnormality IMPRESSION: 1. Moderate patchy bilateral airspace process most prominent over the left lower lobe compatible with multifocal pneumonia. Tiny amount of left pleural fluid. 2. Aortic atherosclerosis. Atherosclerotic coronary artery disease. Aortic Atherosclerosis (ICD10-I70.0). Electronically Signed   By: Elberta Fortis M.D.   On: 01/16/2021 10:06   DG Chest Portable 1 View  Result Date: 01/16/2021 CLINICAL DATA:  55 year old female with shortness of breath, hypoxia on room air. Respiratory testing for COVID-19 pending. EXAM: PORTABLE CHEST 1 VIEW COMPARISON:  Chest radiographs 03/13/2016 and earlier. FINDINGS: Portable AP upright view at 0726 hours. Mildly lower lung volumes, but increased interstitial and indistinct basilar predominant pulmonary opacity is out of proportion to the lower volumes. Mediastinal contours appear stable and within normal limits. Visualized tracheal air column is within normal limits. No pneumothorax, consolidation or pleural effusion. No acute osseous abnormality identified. Paucity of bowel gas in the upper  abdomen. IMPRESSION: Asymmetric patchy and interstitial pulmonary opacity maximal at the left lung base. Appearance most suspicious for acute viral/atypical respiratory infection. Electronically Signed   By: Odessa Fleming M.D.   On: 01/16/2021 07:47     Scheduled Meds: . aspirin  325 mg Oral Daily  . atorvastatin  40 mg Oral q1800  . buPROPion  150 mg Oral Daily  . calcium-vitamin D  1 tablet Oral Daily  . clopidogrel  75 mg Oral Daily  . enoxaparin (LOVENOX) injection  0.5 mg/kg Subcutaneous Q24H  . escitalopram  10 mg Oral Daily  . lidocaine  1 patch Transdermal Q24H  . multivitamin with minerals  1 tablet Oral Daily  . omega-3 acid ethyl esters  1 g Oral Daily  . pantoprazole (PROTONIX) IV  40 mg Intravenous Q24H  . pregabalin  150 mg Oral BID   Continuous Infusions: . azithromycin 500 mg (01/17/21 0846)   . cefTRIAXone (ROCEPHIN)  IV 2 g (01/17/21 1051)     LOS: 1 day     Darlin Priestly, MD [facility] If 7PM-7AM, please contact night-coverage 01/17/2021, 5:58 PM

## 2021-01-17 NOTE — Plan of Care (Signed)
  Problem: Clinical Measurements: Goal: Respiratory complications will improve Outcome: Progressing Goal: Cardiovascular complication will be avoided Outcome: Progressing   

## 2021-01-17 NOTE — Evaluation (Signed)
Clinical/Bedside Swallow Evaluation Patient Details  Name: Terri Wood MRN: [id number] Date of Birth: [birth year]  Today's Date: 01/17/2021 Time: SLP Start Time (ACUTE ONLY): 1140 SLP Stop Time (ACUTE ONLY): 1210 SLP Time Calculation (min) (ACUTE ONLY): 30 min  Past Medical History:  Past Medical History:  Diagnosis Date  . Anxiety   . Fibromyalgia   . Hypertension   . Osteoarthritis of left hip 2019   Past Surgical History:  Past Surgical History:  Procedure Laterality Date  . ABDOMINAL SURGERY  1996   gastric bypass; stapling; surgilite  . PLANTAR FASCIA SURGERY Right 2009  . TOTAL HIP ARTHROPLASTY Left 04/09/2018   Procedure: TOTAL HIP ARTHROPLASTY ANTERIOR APPROACH;  Surgeon: Kennedy Bucker, MD;  Location: [facility] ORS;  Service: Orthopedics;  Laterality: Left;   HPI:  Per admitting H& P "Terri Wood is a 55 y.o. 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  Patient states that she had central chest discomfort 1 day prior to her admission which she attributed to reflux.  Chest discomfort is associated with emesis.  Patient states that she took Tums and Prilosec without any relief in her symptoms.  Shortness of breath is associated with chills and cough that is nonproductive.  She also complains of a sore throat and has had occasional headaches.  She has bilateral lower extremity swelling.  Patient is unvaccinated against the COVID-19 virus and denies having any sick contacts.  She denies having any palpitations, no diaphoresis, no abdominal pain, no constipation, no diarrhea, no urinary frequency, no nocturia, no dysuria, no blurred vision, no dizziness, no lightheadedness.  Labs show sodium 137, potassium 3.4, chloride 96, bicarb 30,  glucose 136, BUN 24, creatinine 1.13, calcium 9.4 alkaline phosphatase 68, albumin 4.0, lipase 26, AST 25, ALT 19, total protein 7.8, BNP 36, troponin VI, white count 7.3 with predominant neutrophils, hemoglobin 12.7, hematocrit 36, MCV 88, RDW 17.1, platelet count 302  Chest x-ray reviewed by me shows asymmetric patchy and interstitial pulmonary opacity maximal at the left lung base. Appearance most suspicious for acute viral/atypical respiratory infection.  CT scan of the chest without contrast shows moderate patchy bilateral airspace process most prominent over the left lower lobe compatible with multifocal pneumonia. Tiny amount of left pleural fluid. Aortic atherosclerosis. Atherosclerotic coronary artery disease.  Twelve-lead EKG reviewed by me shows sinus tachycardia  "   Assessment / Plan / Recommendation Clinical Impression  Pt presents with functional swallowing abilities at bedside with no overt s/s of aspiration. Oral mech exam reveaed missing back teeth but structures to be functioning adequately without weakness. Pt tolerated sips of thin, puree, and solids without s/s of aspiration. Vocal quality remained claer and laryngeal elevation appeared adequate. Pt does report some reflux. Advised on reflux precautions. Rec continue with Dys 3 for ease of chewing. Aspiration precautions discussed. May need to consider reflux meds if persists. Pt to discuss with primary MD after discharge. SLP Visit Diagnosis: Dysphagia, oral phase (R13.11)    Aspiration Risk  Mild aspiration risk    Diet Recommendation Dysphagia 3 (Mech soft)   Medication Administration: Whole meds with liquid Compensations: Slow rate Postural Changes: Seated upright at 90 degrees;Remain upright for at least 30 minutes after po intake    Other  Recommendations  May need  to consider reflux meds  Follow up Recommendations None      Frequency and Duration Other (Comment)   F/u with Nsg x1       Prognosis   Good      Swallow Study   General Date of Onset: 01/16/21 HPI: Per admitting H& P "Terri Wood is a 55 y.o. 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  Patient states that she had central chest discomfort 1 day prior to her admission which she attributed to reflux.  Chest discomfort is associated with emesis.  Patient states that she took Tums and Prilosec without any relief in her symptoms.  Shortness of breath is associated with chills and cough that is nonproductive.  She also complains of a sore throat and has had occasional headaches.  She has bilateral lower extremity swelling.  Patient is unvaccinated against the COVID-19 virus and denies having any sick contacts.  She denies having any palpitations, no diaphoresis, no abdominal pain, no constipation, no diarrhea, no urinary frequency, no nocturia, no dysuria, no blurred vision, no dizziness, no lightheadedness.  Labs show sodium 137, potassium 3.4, chloride 96, bicarb 30, glucose 136, BUN 24, creatinine 1.13, calcium 9.4 alkaline phosphatase 68, albumin 4.0, lipase 26, AST 25, ALT 19, total protein 7.8, BNP 36, troponin VI, white count 7.3 with predominant neutrophils, hemoglobin 12.7, hematocrit 36, MCV 88, RDW 17.1, platelet count 302  Chest x-ray reviewed by me shows asymmetric patchy and interstitial pulmonary opacity maximal at the left lung base. Appearance most suspicious for acute viral/atypical respiratory infection.  CT scan of the chest without contrast shows moderate patchy bilateral airspace process most prominent over the left lower lobe compatible with multifocal pneumonia. Tiny amount of left pleural fluid. Aortic atherosclerosis. Atherosclerotic coronary artery disease.  Twelve-lead EKG  reviewed by me shows sinus tachycardia  " Type of Study: Bedside Swallow Evaluation Diet Prior to this Study: Dysphagia 3 (soft) Temperature Spikes Noted: No Respiratory Status: Room air;Nasal cannula;Other (comment) (Pt reports she was told to leave O2 off to see if she tolerates it ok) History of Recent Intubation: No Behavior/Cognition: Alert;Cooperative;Pleasant mood Oral Cavity Assessment: Within Functional Limits;Other (comment) Oral Care Completed by SLP: No Oral Cavity - Dentition: Adequate natural dentition;Missing dentition;Other (Comment) (Missing back teeth. Chews in the front of the mouth) Self-Feeding Abilities: Able to feed self Patient Positioning: Upright in bed Baseline Vocal Quality: Normal Volitional Swallow: Able to elicit    Oral/Motor/Sensory Function Overall Oral Motor/Sensory Function: Within functional limits   Ice Chips Ice chips: Within functional limits   Thin Liquid Thin Liquid: Within functional limits Presentation: Cup;Straw    Nectar Thick Nectar Thick Liquid: Not tested   Honey Thick Honey Thick Liquid: Not tested   Puree Puree: Within functional limits Presentation: Spoon   Solid     Solid: Impaired (Chews near the fron of the mouth, needs extended time) Presentation: Self Fed Oral Phase Functional Implications: Impaired mastication;Prolonged oral transit      Eather Colas 01/17/2021,12:20 PM

## 2021-01-18 LAB — CBC
HCT: 35.7 % — ABNORMAL LOW (ref 36.0–46.0)
Hemoglobin: 11.4 g/dL — ABNORMAL LOW (ref 12.0–15.0)
MCH: 28.7 pg (ref 26.0–34.0)
MCHC: 31.9 g/dL (ref 30.0–36.0)
MCV: 89.9 fL (ref 80.0–100.0)
Platelets: 291 10*3/uL (ref 150–400)
RBC: 3.97 MIL/uL (ref 3.87–5.11)
RDW: 17.6 % — ABNORMAL HIGH (ref 11.5–15.5)
WBC: 12.1 10*3/uL — ABNORMAL HIGH (ref 4.0–10.5)
nRBC: 0 % (ref 0.0–0.2)

## 2021-01-18 LAB — BASIC METABOLIC PANEL
Anion gap: 6 (ref 5–15)
BUN: 21 mg/dL — ABNORMAL HIGH (ref 6–20)
CO2: 30 mmol/L (ref 22–32)
Calcium: 9.3 mg/dL (ref 8.9–10.3)
Chloride: 104 mmol/L (ref 98–111)
Creatinine, Ser: 0.85 mg/dL (ref 0.44–1.00)
GFR, Estimated: >60 mL/min (ref >60)
Glucose, Bld: 102 mg/dL — ABNORMAL HIGH (ref 70–99)
Potassium: 4.1 mmol/L (ref 3.5–5.1)
Sodium: 140 mmol/L (ref 135–145)

## 2021-01-18 LAB — MAGNESIUM: Magnesium: 2.1 mg/dL (ref 1.7–2.4)

## 2021-01-18 MED ORDER — AMLODIPINE BESYLATE 10 MG PO TABS: ORAL_TABLET | ORAL | Status: DC

## 2021-01-18 MED ORDER — LEVOFLOXACIN 750 MG PO TABS: 750.0000 mg | ORAL_TABLET | Freq: Every day | ORAL | 0 refills | Status: AC

## 2021-01-18 MED ORDER — LISINOPRIL 10 MG PO TABS: ORAL_TABLET | ORAL | 2 refills | Status: DC

## 2021-01-18 NOTE — Discharge Instructions (Signed)

## 2021-01-18 NOTE — Discharge Summary (Signed)
Physician Discharge Summary   Terri Wood  female DOB: 1965-11-25  TGG:[id number]  PCP: Miki Kins, FNP  Admit date: 01/16/2021 Discharge date: 01/18/2021  Admitted From: home Disposition:  home CODE STATUS: Full code  Discharge Instructions    Discharge instructions   Complete by: As directed    You have received 3 days of IV antibiotics for your pneumonia and have improved and no longer needing extra oxygen.  Please take 2 more days of Levaquin starting tomorrow to finish a total of 5 day course.  Your blood pressure has been soft in the hospital, so I am holding both of your blood pressure medications amlodipine and Lisinopril until you follow up with your outpatient doctor.  Your cortisol level was low in the hospital.  Please follow up with your outpatient doctor about this.   Dr. Darlin Priestly [facility] Course:  For full details, please see H&P, progress notes, consult notes and ancillary notes.  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  Acute hypoxic respiratory failure(POA), resolved She had room air pulse oximetry of 84% associated with tachypnea,pulse oximetry improved to 92% following oxygen supplementation at 4 L. Acute respiratory failure appears to be secondary tocommunity acquired pneumonia --Pt was already weaned down to RA the morning after admission.   --treated CAP  Sepsis from community-acquired pneumonia (POA) As evidenced by tachycardia, tachypnea, imaging suggestive of multifocal pneumonia as well asacute hypoxic respiratory failure. --started onRocephin and Zithromaxon admission and IVF resuscitation.  Pt received 3 days of  azithromycin and ceftriaxone with great improvement, so was discharged home with 2 more days of Levaquin.    Low cortisol level Cortisol level was checked on admission, and found to be 1.7.  Pt was advised to follow up with PCP for further evaluation.  Hypertension, not currently active --amlodipine and lisinopril held on admission due to relative hypotension.  Both were held on discharge pending followup with outpatient provider.  History of CVA with residual left arm hemiparesis --cont ASA, statin and plavix  Depression Continue Lexapro and bupropion  Morbid obesity(BMI 50.8) Complicates overall prognosis and care Lifestyle modification and exercise has been discussed with patient in detail   Discharge Diagnoses:  Principal Problem:   Acute respiratory failure (HCC) Active Problems:   Essential hypertension   Fibromyalgia   Community acquired pneumonia   CVA, old, hemiparesis (HCC)   Morbid obesity (HCC)   Sepsis (HCC)   Depression   30 Day Unplanned Readmission Risk Score   Flowsheet Row ED to Hosp-Admission (Current) from 01/16/2021 in [facility] (1C)  30 Day Unplanned Readmission Risk Score (%) 15.51 Filed at 01/18/2021 0801     This score is the patient's risk of an unplanned readmission within 30 days of being discharged (0 -100%). The score is based on dignosis, age, lab data, medications, orders, and past utilization.   Low:  0-14.9   Medium: 15-21.9   High: 22-29.9   Extreme: 30 and above        Discharge Instructions:  Allergies as of 01/18/2021      Reactions   Penicillins Hives   Did it involve swelling of the face/tongue/throat, SOB, or low BP?  Yes Did it involve sudden or severe rash/hives, skin peeling, or any reaction on the inside of your mouth or nose? No Did you need to seek medical attention at a hospital or doctor's office? No When did it last happen? Within the past 10 years If all above answers are "NO", may  proceed with cephalosporin use.      Medication List    STOP taking these medications   clonazePAM 0.5 MG tablet Commonly known as: KLONOPIN   DULoxetine 60 MG capsule Commonly known as: CYMBALTA   fluticasone 50 MCG/ACT nasal spray Commonly known as: FLONASE   tiZANidine 4 MG tablet Commonly known as: ZANAFLEX     TAKE these medications   albuterol 108 (90 Base) MCG/ACT inhaler Commonly known as: VENTOLIN HFA Inhale 2 puffs into the lungs every 4 (four) hours as needed for wheezing.   amLODipine 10 MG tablet Commonly known as: NORVASC Hold until followup with your outpatient doctor because your blood pressure has been on the low side. What changed:   how much to take  how to take this  when to take this  additional instructions   Apple Cider Vinegar Plus Tabs Take 1 tablet by mouth 2 (two) times daily.   aspirin 325 MG EC tablet Take 1 tablet (325 mg total) by mouth daily.   atorvastatin 40 MG tablet Commonly known as: LIPITOR Take 1 tablet (40 mg total) by mouth daily at 6 PM.   baclofen 10 MG tablet Commonly known as: LIORESAL Take 10 mg by mouth 2 (two) times daily as needed for muscle spasms.   buPROPion 150 MG 24 hr tablet Commonly known as: WELLBUTRIN XL Take 150 mg by mouth daily.   CALTRATE 600+D PLUS PO Take 1 tablet by mouth daily.   clopidogrel 75 MG tablet Commonly known as: PLAVIX Take 1 tablet (75 mg total) by mouth daily.   escitalopram 10 MG tablet Commonly known as: LEXAPRO Take 10 mg by mouth daily.   levofloxacin 750 MG tablet Commonly known as: Levaquin Take 1 tablet (750 mg total) by mouth daily for 2 days. Antibiotic for your pneumonia. Start taking on: January 19, 2021   lidocaine 5 % Commonly known as: Lidoderm Place 1 patch onto the skin every 12 (twelve) hours. Remove & Discard patch within 12 hours or as directed by MD   lisinopril 10 MG tablet Commonly known as: ZESTRIL Hold until followup with your outpatient  doctor because your blood pressure has been on the low side. What changed:   how much to take  how to take this  when to take this  additional instructions   MULTI FOR HER 50+ PO Take 1 tablet by mouth daily.   Omega 3 1000 MG Caps Take 1,000 mg by mouth daily with breakfast.   pregabalin 150 MG capsule Commonly known as: LYRICA Take 150 mg by mouth 2 (two) times daily.   traMADol 50 MG tablet Commonly known as: Ultram Take 1 tablet (50 mg total) by mouth every 6 (six) hours as needed for moderate pain. What changed: Another medication with the same name was removed. Continue taking this medication, and follow the directions you see here.        Follow-up Information    Miki Kins, FNP. Schedule an appointment as soon as possible for a visit in 1 week(s).   Specialty: Family Medicine Contact information: [address] [place] [address] [phone]  Allergies  Allergen Reactions  . Penicillins Hives    Did it involve swelling of the face/tongue/throat, SOB, or low BP? Yes Did it involve sudden or severe rash/hives, skin peeling, or any reaction on the inside of your mouth or nose? No Did you need to seek medical attention at a hospital or doctor's office? No When did it last happen? Within the past 10 years If all above answers are "NO", may proceed with cephalosporin use.      The results of significant diagnostics from this hospitalization (including imaging, microbiology, ancillary and laboratory) are listed below for reference.   Consultations:   Procedures/Studies: CT CHEST WO CONTRAST  Result Date: 01/16/2021 CLINICAL DATA:  Possible pneumonia. Effusion or abscess suspected. Shortness of breath and hypoxia. EXAM: CT CHEST WITHOUT CONTRAST TECHNIQUE: Multidetector CT imaging of the chest was performed following the standard protocol without IV contrast. COMPARISON:  Chest x-ray 01/16/2021. FINDINGS: Cardiovascular: Heart is  normal size. Calcified plaque over the 3 vessel coronary arteries. Thoracic aorta is normal in caliber. Remaining vascular structures are unremarkable. Mediastinum/Nodes: No significant mediastinal or hilar adenopathy. Remaining mediastinal structures are unremarkable. Lungs/Pleura: Lungs are adequately inflated demonstrate moderate patchy bilateral airspace consolidation worse over the left lower lobe compatible multifocal pneumonia. Tiny amount of left pleural fluid present. Airways are unremarkable. Upper Abdomen: Postsurgical change over the stomach. Minimal calcified plaque over the abdominal aorta. No acute findings. Musculoskeletal: No focal abnormality IMPRESSION: 1. Moderate patchy bilateral airspace process most prominent over the left lower lobe compatible with multifocal pneumonia. Tiny amount of left pleural fluid. 2. Aortic atherosclerosis. Atherosclerotic coronary artery disease. Aortic Atherosclerosis (ICD10-I70.0). Electronically Signed   By: Elberta Fortisaniel  Boyle M.D.   On: 01/16/2021 10:06   DG Chest Portable 1 View  Result Date: 01/16/2021 CLINICAL DATA:  55 year old female with shortness of breath, hypoxia on room air. Respiratory testing for COVID-19 pending. EXAM: PORTABLE CHEST 1 VIEW COMPARISON:  Chest radiographs 03/13/2016 and earlier. FINDINGS: Portable AP upright view at 0726 hours. Mildly lower lung volumes, but increased interstitial and indistinct basilar predominant pulmonary opacity is out of proportion to the lower volumes. Mediastinal contours appear stable and within normal limits. Visualized tracheal air column is within normal limits. No pneumothorax, consolidation or pleural effusion. No acute osseous abnormality identified. Paucity of bowel gas in the upper abdomen. IMPRESSION: Asymmetric patchy and interstitial pulmonary opacity maximal at the left lung base. Appearance most suspicious for acute viral/atypical respiratory infection. Electronically Signed   By: Odessa FlemingH  Hall M.D.   On:  01/16/2021 07:47      Labs: BNP (last 3 results) Recent Labs    01/16/21 0726  BNP 36.0   Basic Metabolic Panel: Recent Labs  Lab 01/16/21 0726 01/17/21 0548 01/18/21 0338  NA 137 137 140  K 3.4* 3.9 4.1  CL 96* 104 104  CO2 30 28 30   GLUCOSE 136* 119* 102*  BUN 24* 19 21*  CREATININE 1.13* 0.74 0.85  CALCIUM 9.4 9.2 9.3  MG  --   --  2.1   Liver Function Tests: Recent Labs  Lab 01/16/21 0726  AST 25  ALT 19  ALKPHOS 68  BILITOT 0.8  PROT 7.8  ALBUMIN 4.0   Recent Labs  Lab 01/16/21 0726  LIPASE 26   No results for input(s): AMMONIA in the last 168 hours. CBC: Recent Labs  Lab 01/16/21 0726 01/17/21 0548 01/18/21 0338  WBC 7.3 15.5* 12.1*  NEUTROABS 6.2  --   --   HGB 12.7 10.5*  11.4*  HCT 39.6 32.3* 35.7*  MCV 88.8 89.2 89.9  PLT 302 283 291   Cardiac Enzymes: No results for input(s): CKTOTAL, CKMB, CKMBINDEX, TROPONINI in the last 168 hours. BNP: Invalid input(s): POCBNP CBG: No results for input(s): GLUCAP in the last 168 hours. D-Dimer No results for input(s): DDIMER in the last 72 hours. Hgb A1c No results for input(s): HGBA1C in the last 72 hours. Lipid Profile No results for input(s): CHOL, HDL, LDLCALC, TRIG, CHOLHDL, LDLDIRECT in the last 72 hours. Thyroid function studies No results for input(s): TSH, T4TOTAL, T3FREE, THYROIDAB in the last 72 hours.  Invalid input(s): FREET3 Anemia work up No results for input(s): VITAMINB12, FOLATE, FERRITIN, TIBC, IRON, RETICCTPCT in the last 72 hours. Urinalysis    Component Value Date/Time   COLORURINE YELLOW (A) 04/03/2018 1435   APPEARANCEUR CLEAR (A) 04/03/2018 1435   LABSPEC 1.012 04/03/2018 1435   PHURINE 6.0 04/03/2018 1435   GLUCOSEU NEGATIVE 04/03/2018 1435   HGBUR MODERATE (A) 04/03/2018 1435   BILIRUBINUR NEGATIVE 04/03/2018 1435   KETONESUR NEGATIVE 04/03/2018 1435   PROTEINUR NEGATIVE 04/03/2018 1435   NITRITE NEGATIVE 04/03/2018 1435   LEUKOCYTESUR NEGATIVE 04/03/2018  1435   Sepsis Labs Invalid input(s): PROCALCITONIN,  WBC,  LACTICIDVEN Microbiology Recent Results (from the past 240 hour(s))  Resp Panel by RT-PCR (Flu A&B, Covid) Nasopharyngeal Swab     Status: None   Collection Time: 01/16/21  7:25 AM   Specimen: Nasopharyngeal Swab; Nasopharyngeal(NP) swabs in vial transport medium  Result Value Ref Range Status   SARS Coronavirus 2 by RT PCR NEGATIVE NEGATIVE Final    Comment: (NOTE) SARS-CoV-2 target nucleic acids are NOT DETECTED.  The SARS-CoV-2 RNA is generally detectable in upper respiratory specimens during the acute phase of infection. The lowest concentration of SARS-CoV-2 viral copies this assay can detect is 138 copies/mL. A negative result does not preclude SARS-Cov-2 infection and should not be used as the sole basis for treatment or other patient management decisions. A negative result may occur with  improper specimen collection/handling, submission of specimen other than nasopharyngeal swab, presence of viral mutation(s) within the areas targeted by this assay, and inadequate number of viral copies(<138 copies/mL). A negative result must be combined with clinical observations, patient history, and epidemiological information. The expected result is Negative.  Fact Sheet for Patients:  BloggerCourse.com  Fact Sheet for Healthcare Providers:  SeriousBroker.it  This test is no t yet approved or cleared by the [place] FDA and  has been authorized for detection and/or diagnosis of SARS-CoV-2 by FDA under an Emergency Use Authorization (EUA). This EUA will remain  in effect (meaning this test can be used) for the duration of the COVID-19 declaration under Section 564(b)(1) of the Act, 21 U.S.C.section 360bbb-3(b)(1), unless the authorization is terminated  or revoked sooner.       Influenza A by PCR NEGATIVE NEGATIVE Final   Influenza B by PCR NEGATIVE NEGATIVE Final     Comment: (NOTE) The Xpert Xpress SARS-CoV-2/FLU/RSV plus assay is intended as an aid in the diagnosis of influenza from Nasopharyngeal swab specimens and should not be used as a sole basis for treatment. Nasal washings and aspirates are unacceptable for Xpert Xpress SARS-CoV-2/FLU/RSV testing.  Fact Sheet for Patients: BloggerCourse.com  Fact Sheet for Healthcare Providers: SeriousBroker.it  This test is not yet approved or cleared by the [place] FDA and has been authorized for detection and/or diagnosis of SARS-CoV-2 by FDA under an Emergency Use Authorization (EUA). This EUA will  remain in effect (meaning this test can be used) for the duration of the COVID-19 declaration under Section 564(b)(1) of the Act, 21 U.S.C. section 360bbb-3(b)(1), unless the authorization is terminated or revoked.  Performed at [facility], [address] [place], [place] [address]   Culture, blood (routine x 2) Call MD if unable to obtain prior to antibiotics being given     Status: None (Preliminary result)   Collection Time: 01/16/21  9:02 AM   Specimen: BLOOD  Result Value Ref Range Status   Specimen Description BLOOD RIGHT ANTECUBITAL  Final   Special Requests   Final    BOTTLES DRAWN AEROBIC AND ANAEROBIC Blood Culture results may not be optimal due to an excessive volume of blood received in culture bottles   Culture   Final    NO GROWTH 2 DAYS Performed at [facility], [address], [place], [place] [address]    Report Status PENDING  Incomplete  Culture, blood (routine x 2) Call MD if unable to obtain prior to antibiotics being given     Status: None (Preliminary result)   Collection Time: 01/16/21  9:02 AM   Specimen: BLOOD  Result Value Ref Range Status   Specimen Description BLOOD LEFT ANTECUBITAL  Final   Special Requests   Final    BOTTLES DRAWN AEROBIC AND ANAEROBIC Blood Culture results may not be  optimal due to an excessive volume of blood received in culture bottles   Culture   Final    NO GROWTH 2 DAYS Performed at [facility], [address], [place], [place] [address]    Report Status PENDING  Incomplete     Total time spend on discharging this patient, including the last patient exam, discussing the hospital stay, instructions for ongoing care as it relates to all pertinent caregivers, as well as preparing the medical discharge records, prescriptions, and/or referrals as applicable, is 35 minutes.    Darlin Priestly, MD  [facility] 01/18/2021, 9:26 AM

## 2021-01-18 NOTE — Progress Notes (Addendum)
SLP Cancellation Note  Patient Details Name: Terri Wood MRN: [id number] DOB: 1966/07/18   Cancelled treatment:       Reason Eval/Treat Not Completed: SLP screened, no needs identified, will sign off (met w/ pt for general ed, check of status). Pt denied any swallowing concerns; NSG same. Discussed general aspiration precautions w/ pt including chewing solid foods well; pt agreed. Recommended f/u w/ GI for management of REFLUX. Lunch meal ordered w/ pt b/f leaving. NSG to reconsult if new swallowing issues arise while admitted.      Orinda Kenner, MS, CCC-SLP Speech Language Pathologist Rehab Services [phone] [facility] 01/18/2021, 9:51 AM

## 2021-01-18 NOTE — Progress Notes (Signed)
Patient received discharge education and verbalized understanding of instructions at 1130. Patient left unit at appx 1330 in stable condition with all belongings. Ivs were removed prior to discharge.

## 2021-01-21 LAB — CULTURE, BLOOD (ROUTINE X 2)
Culture: NO GROWTH
Culture: NO GROWTH

## 2021-04-27 DIAGNOSIS — E785 Hyperlipidemia, unspecified: Secondary | ICD-10-CM

## 2021-05-12 ENCOUNTER — Other Ambulatory Visit: Payer: Self-pay | Admitting: Orthopedic Surgery

## 2021-05-12 DIAGNOSIS — M1711 Unilateral primary osteoarthritis, right knee: Secondary | ICD-10-CM

## 2021-05-25 ENCOUNTER — Other Ambulatory Visit: Payer: Self-pay

## 2021-05-25 ENCOUNTER — Ambulatory Visit
Admission: RE | Admit: 2021-05-25 | Discharge: 2021-05-25 | Disposition: A | Discharge status: Home / Self Care | Payer: Medicaid Other | Source: Ambulatory Visit | Attending: Orthopedic Surgery | Admitting: Orthopedic Surgery

## 2021-05-25 DIAGNOSIS — M1711 Unilateral primary osteoarthritis, right knee: Secondary | ICD-10-CM | POA: Insufficient documentation

## 2021-06-05 ENCOUNTER — Inpatient Hospital Stay
Admission: EM | Admit: 2021-06-05 | Discharge: 2021-06-07 | DRG: 194 | Disposition: A | Discharge status: Home / Self Care | Payer: Medicaid Other | Attending: Internal Medicine | Admitting: Internal Medicine

## 2021-06-05 ENCOUNTER — Emergency Department: Payer: Medicaid Other

## 2021-06-05 ENCOUNTER — Encounter: Payer: Self-pay | Admitting: Emergency Medicine

## 2021-06-05 ENCOUNTER — Other Ambulatory Visit: Payer: Self-pay

## 2021-06-05 DIAGNOSIS — F32A Depression, unspecified: Secondary | ICD-10-CM | POA: Diagnosis present

## 2021-06-05 DIAGNOSIS — I10 Essential (primary) hypertension: Secondary | ICD-10-CM | POA: Diagnosis present

## 2021-06-05 DIAGNOSIS — I69359 Hemiplegia and hemiparesis following cerebral infarction affecting unspecified side: Secondary | ICD-10-CM

## 2021-06-05 DIAGNOSIS — Z96642 Presence of left artificial hip joint: Secondary | ICD-10-CM | POA: Diagnosis present

## 2021-06-05 DIAGNOSIS — R739 Hyperglycemia, unspecified: Secondary | ICD-10-CM | POA: Diagnosis present

## 2021-06-05 DIAGNOSIS — J189 Pneumonia, unspecified organism: Secondary | ICD-10-CM | POA: Diagnosis present

## 2021-06-05 DIAGNOSIS — R7303 Prediabetes: Secondary | ICD-10-CM | POA: Diagnosis present

## 2021-06-05 DIAGNOSIS — F331 Major depressive disorder, recurrent, moderate: Secondary | ICD-10-CM | POA: Diagnosis present

## 2021-06-05 DIAGNOSIS — Z7982 Long term (current) use of aspirin: Secondary | ICD-10-CM | POA: Diagnosis not present

## 2021-06-05 DIAGNOSIS — R531 Weakness: Secondary | ICD-10-CM

## 2021-06-05 DIAGNOSIS — Z6841 Body Mass Index (BMI) 40.0 and over, adult: Secondary | ICD-10-CM

## 2021-06-05 DIAGNOSIS — M545 Low back pain, unspecified: Secondary | ICD-10-CM | POA: Diagnosis present

## 2021-06-05 DIAGNOSIS — Z880 Allergy status to penicillin: Secondary | ICD-10-CM | POA: Diagnosis not present

## 2021-06-05 DIAGNOSIS — G8929 Other chronic pain: Secondary | ICD-10-CM | POA: Diagnosis present

## 2021-06-05 DIAGNOSIS — E1165 Type 2 diabetes mellitus with hyperglycemia: Secondary | ICD-10-CM

## 2021-06-05 DIAGNOSIS — Z20822 Contact with and (suspected) exposure to covid-19: Secondary | ICD-10-CM | POA: Diagnosis present

## 2021-06-05 DIAGNOSIS — J188 Other pneumonia, unspecified organism: Secondary | ICD-10-CM | POA: Diagnosis present

## 2021-06-05 DIAGNOSIS — Z8249 Family history of ischemic heart disease and other diseases of the circulatory system: Secondary | ICD-10-CM

## 2021-06-05 DIAGNOSIS — E860 Dehydration: Secondary | ICD-10-CM | POA: Diagnosis present

## 2021-06-05 DIAGNOSIS — N179 Acute kidney failure, unspecified: Secondary | ICD-10-CM

## 2021-06-05 DIAGNOSIS — F17210 Nicotine dependence, cigarettes, uncomplicated: Secondary | ICD-10-CM | POA: Diagnosis present

## 2021-06-05 DIAGNOSIS — F419 Anxiety disorder, unspecified: Secondary | ICD-10-CM | POA: Diagnosis present

## 2021-06-05 DIAGNOSIS — Z9884 Bariatric surgery status: Secondary | ICD-10-CM

## 2021-06-05 DIAGNOSIS — Z8673 Personal history of transient ischemic attack (TIA), and cerebral infarction without residual deficits: Secondary | ICD-10-CM

## 2021-06-05 DIAGNOSIS — Z79899 Other long term (current) drug therapy: Secondary | ICD-10-CM

## 2021-06-05 DIAGNOSIS — R0602 Shortness of breath: Secondary | ICD-10-CM | POA: Diagnosis present

## 2021-06-05 DIAGNOSIS — Z8616 Personal history of COVID-19: Secondary | ICD-10-CM | POA: Diagnosis not present

## 2021-06-05 DIAGNOSIS — Z7902 Long term (current) use of antithrombotics/antiplatelets: Secondary | ICD-10-CM | POA: Diagnosis not present

## 2021-06-05 DIAGNOSIS — M797 Fibromyalgia: Secondary | ICD-10-CM | POA: Diagnosis present

## 2021-06-05 HISTORY — DX: Pneumonia, unspecified organism: J18.9

## 2021-06-05 HISTORY — DX: Acute kidney failure, unspecified: N17.9

## 2021-06-05 LAB — BASIC METABOLIC PANEL
Anion gap: 9 (ref 5–15)
BUN: 24 mg/dL — ABNORMAL HIGH (ref 6–20)
CO2: 24 mmol/L (ref 22–32)
Calcium: 8.6 mg/dL — ABNORMAL LOW (ref 8.9–10.3)
Chloride: 99 mmol/L (ref 98–111)
Creatinine, Ser: 1.75 mg/dL — ABNORMAL HIGH (ref 0.44–1.00)
GFR, Estimated: 34 mL/min — ABNORMAL LOW (ref >60)
Glucose, Bld: 215 mg/dL — ABNORMAL HIGH (ref 70–99)
Potassium: 4 mmol/L (ref 3.5–5.1)
Sodium: 132 mmol/L — ABNORMAL LOW (ref 135–145)

## 2021-06-05 LAB — RESP PANEL BY RT-PCR (FLU A&B, COVID) ARPGX2
Influenza A by PCR: NEGATIVE
Influenza B by PCR: NEGATIVE
SARS Coronavirus 2 by RT PCR: NEGATIVE

## 2021-06-05 LAB — CBC
HCT: 40.9 % (ref 36.0–46.0)
Hemoglobin: 13.5 g/dL (ref 12.0–15.0)
MCH: 30.4 pg (ref 26.0–34.0)
MCHC: 33 g/dL (ref 30.0–36.0)
MCV: 92.1 fL (ref 80.0–100.0)
Platelets: 245 10*3/uL (ref 150–400)
RBC: 4.44 MIL/uL (ref 3.87–5.11)
RDW: 16.9 % — ABNORMAL HIGH (ref 11.5–15.5)
WBC: 20.9 10*3/uL — ABNORMAL HIGH (ref 4.0–10.5)
nRBC: 0 % (ref 0.0–0.2)

## 2021-06-05 LAB — PROCALCITONIN: Procalcitonin: 1.47 ng/mL

## 2021-06-05 LAB — CBG MONITORING, ED: Glucose-Capillary: 88 mg/dL (ref 70–99)

## 2021-06-05 LAB — HEMOGLOBIN A1C
Hgb A1c MFr Bld: 5.9 % — ABNORMAL HIGH (ref 4.8–5.6)
Mean Plasma Glucose: 122.63 mg/dL

## 2021-06-05 LAB — CREATININE, SERUM
Creatinine, Ser: 1.5 mg/dL — ABNORMAL HIGH (ref 0.44–1.00)
GFR, Estimated: 41 mL/min — ABNORMAL LOW (ref >60)

## 2021-06-05 MED ORDER — SODIUM CHLORIDE 0.9 % IV SOLN: 500.0000 mg | INTRAVENOUS | Status: DC

## 2021-06-05 MED ORDER — INSULIN ASPART 100 UNIT/ML IJ SOLN
0.0000 [IU] | Freq: Three times a day (TID) | INTRAMUSCULAR | Status: DC
  Administered 2021-06-07: 5 [IU] via SUBCUTANEOUS
  Filled 2021-06-05: qty 1

## 2021-06-05 MED ORDER — ACETAMINOPHEN 325 MG PO TABS
650.0000 mg | ORAL_TABLET | Freq: Four times a day (QID) | ORAL | Status: DC | PRN
  Administered 2021-06-06 – 2021-06-07 (×2): 650 mg via ORAL
  Filled 2021-06-05 (×3): qty 2

## 2021-06-05 MED ORDER — INSULIN ASPART 100 UNIT/ML IJ SOLN
0.0000 [IU] | INTRAMUSCULAR | Status: DC
Start: 2021-06-05 — End: 2021-06-05

## 2021-06-05 MED ORDER — ENOXAPARIN SODIUM 60 MG/0.6ML IJ SOSY
0.5000 mg/kg | PREFILLED_SYRINGE | INTRAMUSCULAR | Status: DC
  Administered 2021-06-06 (×2): 55 mg via SUBCUTANEOUS
  Filled 2021-06-05 (×2): qty 0.6

## 2021-06-05 MED ORDER — LACTATED RINGERS IV BOLUS
1000.0000 mL | Freq: Once | INTRAVENOUS | Status: AC
  Administered 2021-06-05: 1000 mL via INTRAVENOUS

## 2021-06-05 MED ORDER — ACETAMINOPHEN 650 MG RE SUPP: 650.0000 mg | Freq: Four times a day (QID) | RECTAL | Status: DC | PRN

## 2021-06-05 MED ORDER — SODIUM CHLORIDE 0.9 % IV SOLN: 500.0000 mg | Freq: Once | INTRAVENOUS | Status: DC

## 2021-06-05 MED ORDER — SODIUM CHLORIDE 0.9 % IV SOLN: INTRAVENOUS | Status: DC

## 2021-06-05 MED ORDER — SODIUM CHLORIDE 0.9 % IV SOLN: 2.0000 g | INTRAVENOUS | Status: DC

## 2021-06-05 MED ORDER — ONDANSETRON HCL 4 MG PO TABS: 4.0000 mg | ORAL_TABLET | Freq: Four times a day (QID) | ORAL | Status: DC | PRN

## 2021-06-05 MED ORDER — ONDANSETRON HCL 4 MG/2ML IJ SOLN: 4.0000 mg | Freq: Four times a day (QID) | INTRAMUSCULAR | Status: DC | PRN

## 2021-06-05 MED ORDER — SODIUM CHLORIDE 0.9 % IV SOLN: 1.0000 g | Freq: Once | INTRAVENOUS | Status: DC

## 2021-06-05 NOTE — ED Triage Notes (Signed)
Pt via from [facility]. Pt c/o dizziness, grogginess and lightheadedness. Pt was placed on Plaxovid but has not been confirmed COVID +. Pt has been SOB upon exertion and dry cough Denies pain. Pt is A&OX4 and NAD.

## 2021-06-05 NOTE — ED Notes (Signed)
Lab called for blood work

## 2021-06-05 NOTE — ED Notes (Signed)
Terri Limes, MD to notify that pt has [place] IV but no blood work was able to be drawn- lab called- and requested to start antibiotics. Awaiting response.

## 2021-06-05 NOTE — ED Notes (Signed)
IV infiltrating, bolus stopped. Lab with pt to draw blood-work. MD notified.

## 2021-06-05 NOTE — Progress Notes (Signed)
PHARMACIST - PHYSICIAN COMMUNICATION  CONCERNING:  Enoxaparin (Lovenox) for DVT Prophylaxis   DESCRIPTION: Patient was prescribed enoxaprin 40mg  q24 hours for VTE prophylaxis.   Filed Weights   06/05/21 1450  Weight: 108.9 kg (240 lb)    Body mass index is 46.87 kg/m.  Estimated Creatinine Clearance: 41.1 mL/min (A) (by C-G formula based on SCr of 1.75 mg/dL (H)).   Based on [facility] policy patient is candidate for enoxaparin 0.5mg /kg TBW SQ every 24 hours based on BMI being >30.  RECOMMENDATION: Pharmacy has adjusted enoxaparin dose per [facility] policy.  Patient is now receiving enoxaparin 55 mg every 24 hours    [facility], PharmD Clinical Pharmacist  06/05/2021 10:10 PM

## 2021-06-05 NOTE — ED Notes (Signed)
Attempted iv x2 without success, will request alternate RN.

## 2021-06-05 NOTE — ED Notes (Signed)
Pt hard stick, EDP aware- IV team currently attempting to place [place] IV.

## 2021-06-05 NOTE — ED Notes (Signed)
Alternate RN attempted IV without success, EDP notified- will consult IV team per EDP order.

## 2021-06-05 NOTE — H&P (Signed)
History and Physical    Terri Wood JWJ:[id number] DOB: 09/09/1966 DOA: 06/05/2021  PCP: Miki KinsShirley, Amanda M, FNP   Patient coming from: home  I have personally briefly reviewed patient[facility]  Chief Complaint: weakness, cough and shortness of breath  HPI: Terri Wood is a 55 y.o. female with medical history significant for HTN, stroke, class III obesity, depression, prediabetes, hospitalization 4 months ago for respiratory failure secondary to pneumonia, who was recently being treated by PCP with paxlovidfor suspected COVID but with negative rapids and PCR, who presents to the ED with a main complaint of dizziness and lightheadedness, persistent dry cough and shortness of breath on exertion.  She denies fever or chills or chest pain.  She has decreased oral intake but denies vomiting, abdominal pain or diarrhea.  ED course: On arrival afebrile, BP 114/100, pulse 87 with O2 sat 94% on room air Blood work significant for WBC of 21,000 with procalcitonin of 1.47.  Lactic acid pending.  Creatinine 1.75, up from baseline of 0.85 four months prior.  Blood sugar borderline at 215.  COVID and flu PCR negative  EKG, personally viewed and interpreted: Sinus at 86 with nonspecific ST-T wave changes  CT chest without contrast: Multilobar pneumonia, likely atypical in etiology  Patient started on IV hydration, Rocephin and azithromycin.  Hospitalist consulted for admission.  Review of Systems: As per HPI otherwise all other systems on review of systems negative.    Past Medical History:  Diagnosis Date   Anxiety    Fibromyalgia    Hypertension    Osteoarthritis of left hip 2019    Past Surgical History:  Procedure Laterality Date   ABDOMINAL SURGERY  1996   gastric bypass; stapling; surgilite   PLANTAR FASCIA SURGERY Right 2009   TOTAL HIP ARTHROPLASTY Left 04/09/2018   Procedure: TOTAL HIP ARTHROPLASTY ANTERIOR APPROACH;  Surgeon: Kennedy BuckerMenz, Michael, MD;   Location: [facility] ORS;  Service: Orthopedics;  Laterality: Left;     reports that she has been smoking cigarettes. She has been smoking an average of 1 pack per day. She has never used smokeless tobacco. She reports current alcohol use. She reports current drug use. Drug: Marijuana.  Allergies  Allergen Reactions   Penicillins Hives    Did it involve swelling of the face/tongue/throat, SOB, or low BP? Yes Did it involve sudden or severe rash/hives, skin peeling, or any reaction on the inside of your mouth or nose? No Did you need to seek medical attention at a hospital or doctor's office? No When did it last happen? Within the past 10 years  If all above answers are "NO", may proceed with cephalosporin use.     Family History  Problem Relation Age of Onset   Aneurysm Mother    Heart disease Father       Prior to Admission medications   Medication Sig Start Date End Date Taking? Authorizing Provider  albuterol (VENTOLIN HFA) 108 (90 Base) MCG/ACT inhaler Inhale 2 puffs into the lungs every 4 (four) hours as needed for wheezing.    [provider]  amLODipine (NORVASC) 10 MG tablet Hold until followup with your outpatient doctor because your blood pressure has been on the low side. 01/18/21   Darlin PriestlyLai, Tina, MD  Apple Cid Vn-Grn Tea-Bit Or-Cr (APPLE CIDER VINEGAR PLUS) TABS Take 1 tablet by mouth 2 (two) times daily.    [provider]  aspirin EC 325 MG EC tablet Take 1 tablet (325 mg total) by  mouth daily. 08/21/19   Noralee Stain, DO  atorvastatin (LIPITOR) 40 MG tablet Take 1 tablet (40 mg total) by mouth daily at 6 PM. 08/21/19   Noralee Stain, DO  baclofen (LIORESAL) 10 MG tablet Take 10 mg by mouth 2 (two) times daily as needed for muscle spasms.    [provider]  buPROPion (WELLBUTRIN XL) 150 MG 24 hr tablet Take 150 mg by mouth daily.    [provider]  Calcium Carbonate-Vit D-Min (CALTRATE 600+D PLUS PO) Take 1 tablet by mouth daily.    [provider]  clopidogrel (PLAVIX) 75 MG tablet Take 1 tablet (75 mg total) by mouth daily. 08/21/19   Noralee Stain, DO  escitalopram (LEXAPRO) 10 MG tablet Take 10 mg by mouth daily.    [provider]  lidocaine (LIDODERM) 5 % Place 1 patch onto the skin every 12 (twelve) hours. Remove & Discard patch within 12 hours or as directed by MD 12/17/20 12/17/21  [name] Reining, PA-C  lisinopril (ZESTRIL) 10 MG tablet Hold until followup with your outpatient doctor because your blood pressure has been on the low side. 01/18/21   Darlin Priestly, MD  Multiple Vitamins-Minerals (MULTI FOR HER 50+ PO) Take 1 tablet by mouth daily.    [provider]  Omega 3 1000 MG CAPS Take 1,000 mg by mouth daily with breakfast.     [provider]  pregabalin (LYRICA) 150 MG capsule Take 150 mg by mouth 2 (two) times daily. 08/13/19   [provider]  traMADol (ULTRAM) 50 MG tablet Take 1 tablet (50 mg total) by mouth every 6 (six) hours as needed for moderate pain. 12/17/20   [name] Reining, PA-C    Physical Exam: Vitals:   06/05/21 1940 06/05/21 2000 06/05/21 2015 06/05/21 2030  BP: 106/69 (!) 97/57 (!) 95/59 (!) 99/57  Pulse: 90 69 70 71  Resp: 18 15 15 13   Temp:      TempSrc:      SpO2: 98% 94% 99% 97%  Weight:      Height:         Vitals:   06/05/21 1940 06/05/21 2000 06/05/21 2015 06/05/21 2030  BP: 106/69 (!) 97/57 (!) 95/59 (!) 99/57  Pulse: 90 69 70 71  Resp: 18 15 15 13   Temp:      TempSrc:      SpO2: 98% 94% 99% 97%  Weight:      Height:          Constitutional: Lethargic, ill-appearing, oriented x 3 . Not in any apparent distress HEENT:      Head: Normocephalic and atraumatic.         Eyes: PERLA, EOMI, Conjunctivae are normal. Sclera is non-icteric.       Mouth/Throat: Mucous membranes are moist.       Neck: Supple with no signs of meningismus. Cardiovascular: Regular rate and rhythm. No murmurs, gallops, or rubs. 2+ symmetrical distal pulses are  present . No JVD. No LE edema Respiratory: Respiratory effort increased with coarse breath sounds bilaterally Gastrointestinal: Soft, non tender, and non distended with positive bowel sounds.  Genitourinary: No CVA tenderness. Musculoskeletal: Nontender with normal range of motion in all extremities. No cyanosis, or erythema of extremities. Neurologic:  Face is symmetric. Moving all extremities. No gross focal neurologic deficits . Skin: Skin is warm, dry.  No rash or ulcers Psychiatric: Mood and affect are normal    Labs on Admission: I have personally reviewed following  labs and imaging studies  CBC: Recent Labs  Lab 06/05/21 1451  WBC 20.9*  HGB 13.5  HCT 40.9  MCV 92.1  PLT 245   Basic Metabolic Panel: Recent Labs  Lab 06/05/21 1451  NA 132*  K 4.0  CL 99  CO2 24  GLUCOSE 215*  BUN 24*  CREATININE 1.75*  CALCIUM 8.6*   GFR: Estimated Creatinine Clearance: 41.1 mL/min (A) (by C-G formula based on SCr of 1.75 mg/dL (H)). Liver Function Tests: No results for input(s): AST, ALT, ALKPHOS, BILITOT, PROT, ALBUMIN in the last 168 hours. No results for input(s): LIPASE, AMYLASE in the last 168 hours. No results for input(s): AMMONIA in the last 168 hours. Coagulation Profile: No results for input(s): INR, PROTIME in the last 168 hours. Cardiac Enzymes: No results for input(s): CKTOTAL, CKMB, CKMBINDEX, TROPONINI in the last 168 hours. BNP (last 3 results) No results for input(s): PROBNP in the last 8760 hours. HbA1C: No results for input(s): HGBA1C in the last 72 hours. CBG: Recent Labs  Lab 06/05/21 2053  GLUCAP 88   Lipid Profile: No results for input(s): CHOL, HDL, LDLCALC, TRIG, CHOLHDL, LDLDIRECT in the last 72 hours. Thyroid Function Tests: No results for input(s): TSH, T4TOTAL, FREET4, T3FREE, THYROIDAB in the last 72 hours. Anemia Panel: No results for input(s): VITAMINB12, FOLATE, FERRITIN, TIBC, IRON, RETICCTPCT in the last 72 hours. Urine analysis:     Component Value Date/Time   COLORURINE YELLOW (A) 04/03/2018 1435   APPEARANCEUR CLEAR (A) 04/03/2018 1435   LABSPEC 1.012 04/03/2018 1435   PHURINE 6.0 04/03/2018 1435   GLUCOSEU NEGATIVE 04/03/2018 1435   HGBUR MODERATE (A) 04/03/2018 1435   BILIRUBINUR NEGATIVE 04/03/2018 1435   KETONESUR NEGATIVE 04/03/2018 1435   PROTEINUR NEGATIVE 04/03/2018 1435   NITRITE NEGATIVE 04/03/2018 1435   LEUKOCYTESUR NEGATIVE 04/03/2018 1435    Radiological Exams on Admission: CT Chest Wo Contrast  Result Date: 06/05/2021 CLINICAL DATA:  Persistent cough. EXAM: CT CHEST WITHOUT CONTRAST TECHNIQUE: Multidetector CT imaging of the chest was performed following the standard protocol without IV contrast. COMPARISON:  Chest CT dated 01/16/2021. FINDINGS: Evaluation of this exam is limited in the absence of intravenous contrast. Cardiovascular: There is no cardiomegaly or pericardial effusion. Three-vessel coronary vascular calcification. Mild atherosclerotic calcification of the aortic arch. No aneurysmal dilatation. The central pulmonary arteries are grossly unremarkable. Mediastinum/Nodes: No hilar or mediastinal adenopathy. The esophagus is grossly unremarkable. No mediastinal fluid collection. Lungs/Pleura: Scattered ground-glass pulmonary nodules primarily involving the left lung and to a lesser degree right lung most consistent with atypical pneumonia. Aspiration is not excluded. Clinical correlation and follow-up to resolution recommended. No lobar consolidation, pleural effusion, or pneumothorax. The central airways are patent. Upper Abdomen: Postsurgical changes of gastric bypass. Mild irregularity of the liver contour may represent early changes of cirrhosis. Musculoskeletal: Degenerative changes of the spine. No acute osseous pathology. IMPRESSION: 1. Multilobar pneumonia, likely atypical in etiology. 2. Aortic Atherosclerosis (ICD10-I70.0). Electronically Signed   By: Elgie Collard M.D.   On:  06/05/2021 21:12   DG Chest Portable 1 View  Result Date: 06/05/2021 CLINICAL DATA:  Per Triage: Pt via from [facility]. Pt c/o dizziness, grogginess and lightheadedness. Pt was placed on Plaxovid but has not been confirmed COVID +. Pt has been SOB upon exertion and dry cough Denies pain. Pt is AANDOX4 and NAD. EXAM: PORTABLE CHEST - 1 VIEW COMPARISON:  01/16/2021 FINDINGS: Interval improvement in the patchy airspace opacity seen previously. Some residual left infrahilar infiltrate or subsegmental atelectasis  is noted. Heart size and mediastinal contours are within normal limits. No effusion.  No pneumothorax. Visualized bones unremarkable. Surgical clips in the left upper abdomen. IMPRESSION: 1. Interval improvement in aeration with patchy left retrocardiac atelectasis or infiltrate. Electronically Signed   By: Corlis Leak M.D.   On: 06/05/2021 15:52     Assessment/Plan 55 year old female with history of HTN, stroke, class III obesity, depression, prediabetes, presenting with cough, shortness of breath and weakness    Multifocal pneumonia, non-COVID, recurrent -Symptomatic for cough, shortness of breath and weakness - Leukocytosis of 21,000, procalcitonin 1.47 and multifocal pneumonia on CT chest - History of severe pneumonia with sepsis and respirator failure in April 2022 - Rocephin and azithromycin - Supplemental oxygen if needed - IV hydration, antitussives - Follow blood cultures    AKI (acute kidney injury) (HCC) -Creatinine 1.75, up from baseline of 0.85 four months prior - IV hydration and monitor    Borderline hyperglycemia, with history of diabetes - Blood sugar 215 - We will check fingersticks before meals and at bedtime and get A1c    Generalized weakness/lightheadedness - Secondary to acute illness and possibly meds (baclofen and lyrica) - Patient was found to have a low cortisol level during admission in April and follow-up was recommended - Follow-up cortisol level    Essential  hypertension - Continue home lisinopril  History of CVA - Continue aspirin, Plavix and atorvastatin    Obesity, Class III, BMI 40-49.9 (morbid obesity) (HCC) - Complicating factor to overall prognosis and care    Depression, unspecified - Continue Wellbutrin  Chronic musculoskeletal pain - Hold home Lyrica, baclofen and tramadol or judicious use    DVT prophylaxis: Lovenox  Code Status: full code  Family Communication:  none  Disposition Plan: Back to previous home environment Consults called: none  Status:At the time of admission, it appears that the appropriate admission status for this patient is INPATIENT. This is judged to be reasonable and necessary in order to provide the required intensity of service to ensure the patient's safety given the presenting symptoms, physical exam findings, and initial radiographic and laboratory data in the context of their  Comorbid conditions.   Patient requires inpatient status due to high intensity of service, high risk for further deterioration and high frequency of surveillance required.   I certify that at the point of admission it is my clinical judgment that the patient will require inpatient hospital care spanning beyond 2 midnights     Andris Baumann MD [facility]     06/05/2021, 9:58 PM

## 2021-06-05 NOTE — ED Provider Notes (Signed)
[facility] Emergency Department Provider Note  ____________________________________________   Event Date/Time   First MD Initiated Contact with Patient 06/05/21 1946     (approximate)  I have reviewed the triage vital signs and the nursing notes.   HISTORY  Chief Complaint Dizziness   HPI Terri Wood is a 55 y.o. female with a past medical history anxiety, fibromyalgia, HTN, arthritis and recent admission in April of this year for hypoxic respiratory failure secondary to community-acquired pneumonia who presents for assessment of some dizziness lightheadedness and a little bit of cough worsening over last couple days.  Patient states she told her PCP about her symptoms and while she had several negative COVID test and negative COVID test in the office she was started on Paxil for 3 days ago.  She states she had little diarrhea with this but otherwise has not had any significant diarrhea today.  She has not had any chest pain, vomiting, headache, earache, sore throat, rash or extremity pain.  She endorses some chronic low back pain that has not changed at all recently over the last couple weeks.  No recent injuries or falls.  No other acute concerns at this time.         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   Obesity, Class III, BMI 40-49.9 (morbid obesity) (HCC) 01/16/2021   Sepsis (HCC) 01/16/2021   Depression 01/16/2021   Essential hypertension 08/16/2019   Fibromyalgia 08/16/2019   Hyperlipidemia    Smoker    Acute CVA  (cerebrovascular accident) (HCC) 08/15/2019   Avascular necrosis of left femoral head (HCC) 04/09/2018   Depression, unspecified 02/02/2015    Past Surgical History:  Procedure Laterality Date   ABDOMINAL SURGERY  1996   gastric bypass; stapling; surgilite   PLANTAR FASCIA SURGERY Right 2009   TOTAL HIP ARTHROPLASTY Left 04/09/2018   Procedure: TOTAL HIP ARTHROPLASTY ANTERIOR APPROACH;  Surgeon: Kennedy Bucker, MD;  Location: [facility] ORS;  Service: Orthopedics;  Laterality: Left;    Prior to Admission medications   Medication Sig Start Date End Date Taking? Authorizing Provider  albuterol (VENTOLIN HFA) 108 (90 Base) MCG/ACT inhaler Inhale 2 puffs into the lungs every 4 (four) hours as needed for wheezing.    [provider]  amLODipine (NORVASC) 10 MG tablet Hold until followup with your outpatient doctor because your blood pressure has been on the low side. 01/18/21   Darlin Priestly, MD  Apple Cid Vn-Grn Tea-Bit Or-Cr (APPLE CIDER VINEGAR PLUS) TABS Take 1 tablet by mouth 2 (two) times daily.    [provider]  aspirin EC 325 MG EC tablet Take 1 tablet (325 mg total) by mouth daily. 08/21/19   Noralee Stain, DO  atorvastatin (LIPITOR) 40 MG tablet Take 1 tablet (40 mg total) by mouth daily at 6 PM. 08/21/19   Noralee Stain, DO  baclofen (LIORESAL) 10 MG tablet Take 10 mg by mouth 2 (two) times daily as needed for muscle spasms.    [provider]  buPROPion (WELLBUTRIN XL) 150 MG 24 hr tablet Take 150 mg  by mouth daily.    [provider]  Calcium Carbonate-Vit D-Min (CALTRATE 600+D PLUS PO) Take 1 tablet by mouth daily.    [provider]  clopidogrel (PLAVIX) 75 MG tablet Take 1 tablet (75 mg total) by mouth daily. 08/21/19   Noralee Stain, DO  escitalopram (LEXAPRO) 10 MG tablet Take 10 mg by mouth daily.    [provider]  lidocaine (LIDODERM) 5 % Place 1 patch onto the skin every 12 (twelve) hours. Remove & Discard patch within 12 hours or  as directed by MD 12/17/20 12/17/21  [name] Reining, PA-C  lisinopril (ZESTRIL) 10 MG tablet Hold until followup with your outpatient doctor because your blood pressure has been on the low side. 01/18/21   Darlin Priestly, MD  Multiple Vitamins-Minerals (MULTI FOR HER 50+ PO) Take 1 tablet by mouth daily.    [provider]  Omega 3 1000 MG CAPS Take 1,000 mg by mouth daily with breakfast.     [provider]  pregabalin (LYRICA) 150 MG capsule Take 150 mg by mouth 2 (two) times daily. 08/13/19   [provider]  traMADol (ULTRAM) 50 MG tablet Take 1 tablet (50 mg total) by mouth every 6 (six) hours as needed for moderate pain. 12/17/20   [name] Reining, PA-C    Allergies Penicillins  Family History  Problem Relation Age of Onset   Aneurysm Mother    Heart disease Father     Social History Social History   Tobacco Use   Smoking status: Every Day    Packs/day: 1.00    Types: Cigarettes   Smokeless tobacco: Never  Vaping Use   Vaping Use: Never used  Substance Use Topics   Alcohol use: Yes    Comment: occassionally   Drug use: Yes    Types: Marijuana    Comment: last week    Review of Systems  Review of Systems  Constitutional:  Positive for malaise/fatigue. Negative for chills and fever.  HENT:  Negative for sore throat.   Eyes:  Negative for pain.  Respiratory:  Positive for cough. Negative for stridor.   Cardiovascular:  Negative for chest pain.  Gastrointestinal:  Positive for diarrhea. Negative for vomiting.  Genitourinary:  Negative for dysuria.  Musculoskeletal:  Negative for myalgias.  Skin:  Negative for rash.  Neurological:  Positive for weakness. Negative for seizures, loss of consciousness and headaches.  Psychiatric/Behavioral:  Negative for suicidal ideas.   All other systems reviewed and are negative.    ____________________________________________   PHYSICAL EXAM:  VITAL SIGNS: ED Triage Vitals  Enc Vitals Group     BP 06/05/21  1449 (!) 114/100     Pulse Rate 06/05/21 1449 87     Resp 06/05/21 1449 20     Temp 06/05/21 1449 98.2 F (36.8 C)     Temp Source 06/05/21 1449 Oral     SpO2 06/05/21 1449 94 %     Weight 06/05/21 1450 240 lb (108.9 kg)     Height 06/05/21 1450 5' (1.524 m)     Head Circumference --      Peak Flow --      Pain Score 06/05/21 1450 0     Pain Loc --      Pain Edu? --      Excl. in GC? --    Vitals:   06/05/21 2015 06/05/21 2030  BP: (!) 95/59 (!) 99/57  Pulse: 70 71  Resp: 15 13  Temp:  SpO2: 99% 97%   Physical Exam Vitals and nursing note reviewed.  Constitutional:      General: She is not in acute distress.    Appearance: She is well-developed. She is obese. She is ill-appearing.  HENT:     Head: Normocephalic and atraumatic.     Right Ear: External ear normal.     Left Ear: External ear normal.     Mouth/Throat:     Mouth: Mucous membranes are dry.  Eyes:     Conjunctiva/sclera: Conjunctivae normal.  Cardiovascular:     Rate and Rhythm: Normal rate and regular rhythm.     Heart sounds: No murmur heard. Pulmonary:     Effort: Pulmonary effort is normal. No respiratory distress.     Breath sounds: Normal breath sounds.  Abdominal:     Palpations: Abdomen is soft.     Tenderness: There is no abdominal tenderness.  Musculoskeletal:     Cervical back: Neck supple.  Skin:    General: Skin is warm and dry.     Capillary Refill: Capillary refill takes more than 3 seconds.  Neurological:     General: No focal deficit present.     Mental Status: She is alert and oriented to person, place, and time.  Psychiatric:        Mood and Affect: Mood normal.     ____________________________________________   LABS (all labs ordered are listed, but only abnormal results are displayed)  Labs Reviewed  BASIC METABOLIC PANEL - Abnormal; Notable for the following components:      Result Value   Sodium 132 (*)    Glucose, Bld 215 (*)    BUN 24 (*)    Creatinine, Ser 1.75  (*)    Calcium 8.6 (*)    GFR, Estimated 34 (*)    All other components within normal limits  CBC - Abnormal; Notable for the following components:   WBC 20.9 (*)    RDW 16.9 (*)    All other components within normal limits  RESP PANEL BY RT-PCR (FLU A&B, COVID) ARPGX2  CULTURE, BLOOD (SINGLE)  PROCALCITONIN  URINALYSIS, COMPLETE (UACMP) WITH MICROSCOPIC  HEPATIC FUNCTION PANEL  HEMOGLOBIN A1C  LACTIC ACID, PLASMA  LACTIC ACID, PLASMA  PROTIME-INR  APTT  CREATININE, SERUM  CBG MONITORING, ED  TROPONIN I (HIGH SENSITIVITY)  TROPONIN I (HIGH SENSITIVITY)   ____________________________________________  EKG  ECG shows sinus rhythm with ventricular rate of 86, Q waves in aVF and V2 without any other clearance of acute ischemia or significant arrhythmia.  These changes appear present on ECG obtained on 01/16/2022. ____________________________________________  RADIOLOGY  ED MD interpretation: Chest x-ray shows evidence of some patchy opacities in the left without large effusion, overt edema or pneumothorax.  CT chest remarkable for multifocal pneumonia and aortic atherosclerosis.  No other acute thoracic process i.e. edema or effusion.  Official radiology report(s): CT Chest Wo Contrast  Result Date: 06/05/2021 CLINICAL DATA:  Persistent cough. EXAM: CT CHEST WITHOUT CONTRAST TECHNIQUE: Multidetector CT imaging of the chest was performed following the standard protocol without IV contrast. COMPARISON:  Chest CT dated 01/16/2021. FINDINGS: Evaluation of this exam is limited in the absence of intravenous contrast. Cardiovascular: There is no cardiomegaly or pericardial effusion. Three-vessel coronary vascular calcification. Mild atherosclerotic calcification of the aortic arch. No aneurysmal dilatation. The central pulmonary arteries are grossly unremarkable. Mediastinum/Nodes: No hilar or mediastinal adenopathy. The esophagus is grossly unremarkable. No mediastinal fluid collection.  Lungs/Pleura: Scattered ground-glass pulmonary nodules primarily involving the  left lung and to a lesser degree right lung most consistent with atypical pneumonia. Aspiration is not excluded. Clinical correlation and follow-up to resolution recommended. No lobar consolidation, pleural effusion, or pneumothorax. The central airways are patent. Upper Abdomen: Postsurgical changes of gastric bypass. Mild irregularity of the liver contour may represent early changes of cirrhosis. Musculoskeletal: Degenerative changes of the spine. No acute osseous pathology. IMPRESSION: 1. Multilobar pneumonia, likely atypical in etiology. 2. Aortic Atherosclerosis (ICD10-I70.0). Electronically Signed   By: Elgie CollardArash  Radparvar M.D.   On: 06/05/2021 21:12   DG Chest Portable 1 View  Result Date: 06/05/2021 CLINICAL DATA:  [facility] Pt c/o dizziness, grogginess and lightheadedness. Pt was placed on Plaxovid but has not been confirmed COVID +. Pt has been SOB upon exertion and dry cough Denies pain. Pt is AANDOX4 and NAD. EXAM: PORTABLE CHEST - 1 VIEW COMPARISON:  01/16/2021 FINDINGS: Interval improvement in the patchy airspace opacity seen previously. Some residual left infrahilar infiltrate or subsegmental atelectasis is noted. Heart size and mediastinal contours are within normal limits. No effusion.  No pneumothorax. Visualized bones unremarkable. Surgical clips in the left upper abdomen. IMPRESSION: 1. Interval improvement in aeration with patchy left retrocardiac atelectasis or infiltrate. Electronically Signed   By: Corlis Leak  Hassell M.D.   On: 06/05/2021 15:52    ____________________________________________   PROCEDURES  Procedure(s) performed (including Critical Care):  .1-3 Lead EKG Interpretation  Date/Time: 06/05/2021 10:01 PM Performed by: Gilles ChiquitoSmith,  P, MD Authorized by: Gilles ChiquitoSmith,  P, MD     Interpretation: normal     ECG rate assessment: normal     Rhythm: sinus rhythm     Ectopy: none      Conduction: normal     ____________________________________________   INITIAL IMPRESSION / ASSESSMENT AND PLAN / ED COURSE      Patient presents with above-stated history exam for assessment of worsening weakness and lightheadedness as well as a cough after recently being started on Paxil and for presumptive diagnosis of COVID although it seems she was never given a positive COVID test.  On arrival she is afebrile and hemodynamically stable although very dry appearing.  When she is endorsing a cough differential includes heart failure, pneumonia versus bronchitis.  She denies any chest pain and ECG appears largely similar to prior and I have a low suspicion for ACS although will send troponin as well.  CBC shows WBC count of 20.9 without evidence of acute anemia.  BMP remarkable for evidence of an AKI with a creatinine of 1.75 compared to 0.854 months ago.  Patient is also mildly hyperglycemic with a glucose of 215 there are no other significant electrolyte or metabolic derangements.  Procalcitonin is quite elevated at 1.47.  Patient's COVID influenza PCR in the emergency room here is negative  Chest x-ray shows evidence of some patchy opacities in the left without large effusion, overt edema or pneumothorax.  CT chest remarkable for multifocal pneumonia and aortic atherosclerosis.  No other acute thoracic process i.e. edema or effusion.  Given absence of edema on chest imaging with findings on CT concerning for multifocal pneumonia, leukocytosis and elevated procalcitonin I am concerned for kidney acquired pneumonia causing patient's dehydration weakness and AKI.  Will treat with Rocephin and Cipro.  Will order blood culture and lactic.  I will admit to medicine service.  I will give patient IV fluid bolus and start her on sliding scale insulin.     ____________________________________________   FINAL CLINICAL IMPRESSION(S) / ED  DIAGNOSES  Final diagnoses:  Community acquired pneumonia,  unspecified laterality  AKI (acute kidney injury) (HCC)    Medications  lactated ringers bolus 1,000 mL (has no administration in time range)  insulin aspart (novoLOG) injection 0-15 Units (0 Units Subcutaneous Not Given 06/05/21 2054)  cefTRIAXone (ROCEPHIN) 1 g in sodium chloride 0.9 % 100 mL IVPB (has no administration in time range)  azithromycin (ZITHROMAX) 500 mg in sodium chloride 0.9 % 250 mL IVPB (has no administration in time range)  insulin aspart (novoLOG) injection 0-15 Units (has no administration in time range)  cefTRIAXone (ROCEPHIN) 2 g in sodium chloride 0.9 % 100 mL IVPB (has no administration in time range)  azithromycin (ZITHROMAX) 500 mg in sodium chloride 0.9 % 250 mL IVPB (has no administration in time range)  enoxaparin (LOVENOX) injection 40 mg (has no administration in time range)  0.9 %  sodium chloride infusion (has no administration in time range)  acetaminophen (TYLENOL) tablet 650 mg (has no administration in time range)    Or  acetaminophen (TYLENOL) suppository 650 mg (has no administration in time range)  ondansetron (ZOFRAN) tablet 4 mg (has no administration in time range)    Or  ondansetron (ZOFRAN) injection 4 mg (has no administration in time range)     ED Discharge Orders     None        Note:  This document was prepared using Dragon voice recognition software and may include unintentional dictation errors.    Gilles Chiquito, MD 06/05/21 [phone]

## 2021-06-06 LAB — URINALYSIS, COMPLETE (UACMP) WITH MICROSCOPIC
Bilirubin Urine: NEGATIVE
Glucose, UA: NEGATIVE mg/dL
Hgb urine dipstick: NEGATIVE
Ketones, ur: NEGATIVE mg/dL
Leukocytes,Ua: NEGATIVE
Nitrite: NEGATIVE
Protein, ur: NEGATIVE mg/dL
Specific Gravity, Urine: 1.01 (ref 1.005–1.030)
pH: 6 (ref 5.0–8.0)

## 2021-06-06 LAB — CBG MONITORING, ED
Glucose-Capillary: 68 mg/dL — ABNORMAL LOW (ref 70–99)
Glucose-Capillary: 81 mg/dL (ref 70–99)
Glucose-Capillary: 52 mg/dL — ABNORMAL LOW (ref 70–99)
Glucose-Capillary: 79 mg/dL (ref 70–99)
Glucose-Capillary: 63 mg/dL — ABNORMAL LOW (ref 70–99)

## 2021-06-06 LAB — HEPATIC FUNCTION PANEL
ALT: 18 U/L (ref 0–44)
AST: 17 U/L (ref 15–41)
Albumin: 3.9 g/dL (ref 3.5–5.0)
Alkaline Phosphatase: 70 U/L (ref 38–126)
Bilirubin, Direct: <0.1 mg/dL (ref 0.0–0.2)
Total Bilirubin: 0.6 mg/dL (ref 0.3–1.2)
Total Protein: 7.1 g/dL (ref 6.5–8.1)

## 2021-06-06 LAB — TROPONIN I (HIGH SENSITIVITY): Troponin I (High Sensitivity): 5 ng/L (ref <18)

## 2021-06-06 LAB — GLUCOSE, CAPILLARY
Glucose-Capillary: 134 mg/dL — ABNORMAL HIGH (ref 70–99)
Glucose-Capillary: 145 mg/dL — ABNORMAL HIGH (ref 70–99)

## 2021-06-06 LAB — PROTIME-INR
INR: 1 (ref 0.8–1.2)
Prothrombin Time: 13.5 seconds (ref 11.4–15.2)

## 2021-06-06 LAB — APTT: aPTT: 32 seconds (ref 24–36)

## 2021-06-06 LAB — LACTIC ACID, PLASMA
Lactic Acid, Venous: 0.9 mmol/L (ref 0.5–1.9)
Lactic Acid, Venous: 1.4 mmol/L (ref 0.5–1.9)

## 2021-06-06 MED ORDER — ESCITALOPRAM OXALATE 10 MG PO TABS
10.0000 mg | ORAL_TABLET | Freq: Every day | ORAL | Status: DC
  Administered 2021-06-06 – 2021-06-07 (×2): 10 mg via ORAL
  Filled 2021-06-06 (×2): qty 1

## 2021-06-06 MED ORDER — ATORVASTATIN CALCIUM 20 MG PO TABS
40.0000 mg | ORAL_TABLET | Freq: Every day | ORAL | Status: DC
  Administered 2021-06-06: 40 mg via ORAL
  Filled 2021-06-06: qty 2

## 2021-06-06 MED ORDER — SODIUM CHLORIDE 0.9 % IV SOLN
500.0000 mg | INTRAVENOUS | Status: DC
  Administered 2021-06-06 – 2021-06-07 (×2): 500 mg via INTRAVENOUS
  Filled 2021-06-06 (×3): qty 500

## 2021-06-06 MED ORDER — METOPROLOL TARTRATE 5 MG/5ML IV SOLN: 5.0000 mg | INTRAVENOUS | Status: DC | PRN

## 2021-06-06 MED ORDER — IPRATROPIUM-ALBUTEROL 0.5-2.5 (3) MG/3ML IN SOLN: 3.0000 mL | RESPIRATORY_TRACT | Status: DC | PRN

## 2021-06-06 MED ORDER — TRAZODONE HCL 50 MG PO TABS
50.0000 mg | ORAL_TABLET | Freq: Every evening | ORAL | Status: DC | PRN
  Administered 2021-06-06: 50 mg via ORAL
  Filled 2021-06-06: qty 1

## 2021-06-06 MED ORDER — SODIUM CHLORIDE 0.9 % IV SOLN: INTRAVENOUS | Status: DC

## 2021-06-06 MED ORDER — HYDROCODONE-ACETAMINOPHEN 5-325 MG PO TABS
1.0000 | ORAL_TABLET | Freq: Four times a day (QID) | ORAL | Status: DC | PRN
Start: 2021-06-06 — End: 2021-06-07
  Administered 2021-06-06: 1 via ORAL
  Filled 2021-06-06: qty 1

## 2021-06-06 MED ORDER — AMLODIPINE BESYLATE 5 MG PO TABS
5.0000 mg | ORAL_TABLET | Freq: Every day | ORAL | Status: DC
  Administered 2021-06-06 – 2021-06-07 (×2): 5 mg via ORAL
  Filled 2021-06-06 (×2): qty 1

## 2021-06-06 MED ORDER — HYDRALAZINE HCL 20 MG/ML IJ SOLN: 10.0000 mg | INTRAMUSCULAR | Status: DC | PRN

## 2021-06-06 MED ORDER — BUPROPION HCL ER (XL) 150 MG PO TB24
150.0000 mg | ORAL_TABLET | Freq: Every day | ORAL | Status: DC
  Administered 2021-06-06 – 2021-06-07 (×2): 150 mg via ORAL
  Filled 2021-06-06 (×3): qty 1

## 2021-06-06 MED ORDER — CLOPIDOGREL BISULFATE 75 MG PO TABS: 75.0000 mg | ORAL_TABLET | Freq: Every day | ORAL | Status: DC

## 2021-06-06 MED ORDER — MECLIZINE HCL 25 MG PO TABS
25.0000 mg | ORAL_TABLET | Freq: Three times a day (TID) | ORAL | Status: DC | PRN
  Filled 2021-06-06: qty 1

## 2021-06-06 MED ORDER — SODIUM CHLORIDE 0.9 % IV SOLN
2.0000 g | INTRAVENOUS | Status: DC
  Administered 2021-06-06 – 2021-06-07 (×2): 2 g via INTRAVENOUS
  Filled 2021-06-06 (×3): qty 20

## 2021-06-06 MED ORDER — IPRATROPIUM-ALBUTEROL 0.5-2.5 (3) MG/3ML IN SOLN
3.0000 mL | Freq: Two times a day (BID) | RESPIRATORY_TRACT | Status: DC
  Administered 2021-06-06 – 2021-06-07 (×2): 3 mL via RESPIRATORY_TRACT
  Filled 2021-06-06 (×3): qty 3

## 2021-06-06 MED ORDER — SENNOSIDES-DOCUSATE SODIUM 8.6-50 MG PO TABS: 1.0000 | ORAL_TABLET | Freq: Every evening | ORAL | Status: DC | PRN

## 2021-06-06 MED ORDER — ASPIRIN EC 325 MG PO TBEC
325.0000 mg | DELAYED_RELEASE_TABLET | Freq: Every day | ORAL | Status: DC
  Administered 2021-06-06 – 2021-06-07 (×2): 325 mg via ORAL
  Filled 2021-06-06 (×3): qty 1

## 2021-06-06 NOTE — ED Notes (Signed)
Pt walked to bathroom with this NT pt back in bed with no other needs.

## 2021-06-06 NOTE — Evaluation (Signed)
Occupational Therapy Evaluation Patient Details Name: Terri Wood MRN: [id number] DOB: [birth year] Today's Date: 06/06/2021    History of Present Illness 55 y.o. female with medical history significant for HTN, stroke, class III obesity, depression, prediabetes, hospitalization 4 months ago for respiratory failure secondary to pneumonia, who was recently being treated by PCP with paxlovidfor suspected COVID but with negative rapids and PCR, who presents to the ED with a main complaint of dizziness and lightheadedness, persistent dry cough and shortness of breath on exertion.  CT chest noted to have multifocal pneumonia, elevated WBC and procalcitonin 1.4.  COVID and flu PCR negative.  Empirically started on IV Rocephin and azithromycin.   Clinical Impression   Ms. Pecora presents today with generalized weakness and reduced endurance. She denies pain, other than the "usual" discomfort she has 2/2 fibromyalgia. Pt is living with her mother in a single-store home, 3 STE, with railing and wall for balance support. She is able to perform all ADL and IADL INDly, other than driving, which she avoids 2/2 post-stroke decrease in peripheral vision. When reports that if she feels dizzy or tired, she uses a SPC; otherwise ambulates w/o AE. Today pt is able to perform bed mobility, transfers, feeding, grooming, w/in room ambulation, all without physical assistance, without AE, and with no LOB. Pt does require a period of "adjustment" after she transitions from sitting to standing, saying, "I take a little time to get my bearings." Pt feels she is performing today at/near her baseline, has no safety concerns. No additional OT required at present. Pt will be coming to [facility] for TKA in one month, looks forward to working with OT and PT at that time.    Follow Up Recommendations  No OT follow up    Equipment Recommendations  None recommended by OT    Recommendations for Other Services       Precautions /  Restrictions Precautions Precautions: None Restrictions Weight Bearing Restrictions: No      Mobility Bed Mobility Overal bed mobility: Independent                  Transfers Overall transfer level: Modified independent               General transfer comment: Increased time    Balance Overall balance assessment: Modified Independent                                         ADL either performed or assessed with clinical judgement   ADL Overall ADL's : Modified independent                                             Vision Patient Visual Report: Peripheral vision impairment;No change from baseline Additional Comments: Impaired bilateral peripheral vision post CVA     Perception     Praxis      Pertinent Vitals/Pain Pain Assessment: No/denies pain     Hand Dominance Right   Extremity/Trunk Assessment Upper Extremity Assessment Upper Extremity Assessment: RUE deficits/detail RUE Deficits / Details: reduced strength, ROM in RUE post CVA. LUE WFL.   Lower Extremity Assessment Lower Extremity Assessment: Overall WFL for tasks assessed   Cervical / Trunk Assessment Cervical / Trunk Assessment: Normal   Communication Communication Communication: No difficulties  Cognition Arousal/Alertness: Awake/alert Behavior During Therapy: WFL for tasks assessed/performed Overall Cognitive Status: Within Functional Limits for tasks assessed                                     General Comments       Exercises Other Exercises Other Exercises: Bed mobility, w/in room ambulation, ADLs, discussion re: DC planning, falls prevention   Shoulder Instructions      Home Living Family/patient expects to be discharged to:: Private residence Living Arrangements: Other relatives (pt lives with her mom) Available Help at Discharge: Family;Available 24 hours/day Type of Home: House Home Access: Stairs to  enter [employer] of Steps: 3 Entrance Stairs-Rails: Right Home Layout: One level     Bathroom Shower/Tub: Chief Strategy Officer: Standard     Home Equipment: Cane - single point;Grab bars - tub/shower;Shower seat          Prior Functioning/Environment Level of Independence: Needs assistance  Gait / Transfers Assistance Needed: uses SPC on occassion. Reports 2 falls in previous 6 months ADL's / Homemaking Assistance Needed: does own ADL, shopping, cooking. Does not drive, 2/2 to vision impairment post CVA. Uses scooter at grocery store.   Comments: Pt on disability at present        OT Problem List: Decreased strength;Decreased activity tolerance      OT Treatment/Interventions:      OT Goals(Current goals can be found in the care plan section) Acute Rehab OT Goals Patient Stated Goal: to get out of the ER OT Goal Formulation: With patient Time For Goal Achievement: 06/20/21 Potential to Achieve Goals: Good ADL Goals Pt/caregiver will Perform Home Exercise Program: Increased ROM;Increased strength;Independently (for increased endurance, stress management, pain management, reduced falls risk) Additional ADL Goal #1: Pt will be able to identify/demonstrate 2+ falls prevention strategies Additional ADL Goal #2: Pt will be able to ID 2+ non-medication-based pain mgmt strategies  OT Frequency:     Barriers to D/C:            Co-evaluation              AM-PAC OT "6 Clicks" Daily Activity     Outcome Measure Help from another person eating meals?: None Help from another person taking care of personal grooming?: None Help from another person toileting, which includes using toliet, bedpan, or urinal?: None Help from another person bathing (including washing, rinsing, drying)?: None Help from another person to put on and taking off regular upper body clothing?: None Help from another person to put on and taking off regular lower body  clothing?: None 6 Click Score: 24   End of Session    Activity Tolerance: Patient tolerated treatment well Patient left: in bed;with nursing/sitter in room;with call bell/phone within reach  OT Visit Diagnosis: Muscle weakness (generalized) (M62.81);Unsteadiness on feet (R26.81)                Time: 7026-3785 OT Time Calculation (min): 27 min Charges:  OT General Charges $OT Visit: 1 Visit OT Evaluation $OT Eval Low Complexity: 1 Low OT Treatments $Self Care/Home Management : 23-37 mins Latina Craver, PhD, MS, OTR/L 06/06/21, 11:53 AM

## 2021-06-06 NOTE — ED Notes (Signed)
Assisted pt to the restroom, steady gait noted.  °

## 2021-06-06 NOTE — ED Notes (Signed)
Pt provided pitcher of water. 

## 2021-06-06 NOTE — ED Notes (Signed)
Care transferred, report received from [place], [place]

## 2021-06-06 NOTE — Progress Notes (Signed)
PROGRESS NOTE    Terri Wood  BZJ:[id number] DOB: 09/09/1966 DOA: 06/05/2021 PCP: Miki Kins, FNP   Brief Narrative:  55 y.o. female with medical history significant for HTN, stroke, class III obesity, depression, prediabetes, hospitalization 4 months ago for respiratory failure secondary to pneumonia, who was recently being treated by PCP with paxlovidfor suspected COVID but with negative rapids and PCR, who presents to the ED with a main complaint of dizziness and lightheadedness, persistent dry cough and shortness of breath on exertion.  CT chest noted to have multifocal pneumonia, elevated WBC and procalcitonin 1.4.  COVID and flu PCR negative.  Empirically started on IV Rocephin and azithromycin.   Assessment & Plan:   Principal Problem:   Multifocal pneumonia Active Problems:   Essential hypertension   CVA, old, hemiparesis (HCC)   Obesity, Class III, BMI 40-49.9 (morbid obesity) (HCC)   Depression, unspecified   AKI (acute kidney injury) (HCC)   Borderline hyperglycemia   Generalized weakness     Multifocal pneumonia, non-COVID, recurrent -Suspect superimposed bacterial pneumonia with recent COVID-19 infection.  COVID-19/flu PCR negative.  UA-negative - Procalcitonin 1.4.  Monitor leukocytosis.  Supportive care.  As needed bronchodilators, I-S/flutter - Antibiotics IV Rocephin/azithromycin      AKI (acute kidney injury) (HCC) -Creatinine 1.75, up from baseline of 0.85 four months prior - IContinue IV fluids     Borderline hyperglycemia, with history of diabetes -A1c 5.9.  Insulin sliding scale and Accu-Chek     Generalized weakness/lightheadedness - Secondary to acute illness and possibly meds (baclofen and lyrica) -Random cortisol level- - PT/OT     Essential hypertension -Holding home lisinopril in the setting of AKI   History of CVA - Continue aspirin, Plavix and atorvastatin     Obesity, Class III, BMI 40-49.9 (morbid obesity) (HCC) - Complicating  factor to overall prognosis and care     Depression, unspecified - Continue Wellbutrin  Chronic musculoskeletal pain - pain control.         DVT prophylaxis:   Lovenox Code Status: Full code Family Communication:    Status is: Inpatient  Remains inpatient appropriate because:Inpatient level of care appropriate due to severity of illness.  Requiring IV fluids and antibiotics for multifocal pneumonia and acute kidney injury  Dispo: The patient is from: Home              Anticipated d/c is to: Home              Patient currently is not medically stable to d/c.   Difficult to place patient No         Subjective: Patient tells me her symptoms started with some sinusitis ear fullness and mild dizziness.  Despite of her outpatient negative COVID test she was given regimen of proximal COVID.  Her symptoms persisted therefore came to the hospital for further evaluation.  She has some exertional dyspnea.  Patient is COVID-negative in the hospital again and she reports she was also negative at home.  Review of Systems Otherwise negative except as per HPI, including: General: Denies fever, chills, night sweats or unintended weight loss. Resp: Denies hemoptysis Cardiac: Denies chest pain, palpitations, orthopnea, paroxysmal nocturnal dyspnea. GI: Denies abdominal pain, nausea, vomiting, diarrhea or constipation GU: Denies dysuria, frequency, hesitancy or incontinence MS: Denies muscle aches, joint pain or swelling Neuro: Denies headache, neurologic deficits (focal weakness, numbness, tingling), abnormal gait Psych: Denies anxiety, depression, SI/HI/AVH Skin: Denies new rashes or lesions ID: Denies sick contacts, exotic exposures, travel  Examination:  General exam: Appears calm and comfortable  Respiratory system: Bilateral rhonchi Cardiovascular system: S1 & S2 heard, RRR. No JVD, murmurs, rubs, gallops or clicks. No pedal edema. Gastrointestinal system: Abdomen is  nondistended, soft and nontender. No organomegaly or masses felt. Normal bowel sounds heard. Central nervous system: Alert and oriented. No focal neurological deficits. Extremities: Symmetric 5 x 5 power. Skin: No rashes, lesions or ulcers Psychiatry: Judgement and insight appear normal. Mood & affect appropriate.     Objective: Vitals:   06/06/21 0700 06/06/21 0730 06/06/21 0815 06/06/21 0830  BP: 107/66 108/80 97/65 (!) 94/54  Pulse: 70 71 72 68  Resp: 14 13 15 17   Temp:      TempSrc:      SpO2: 92% 100% 95% 96%  Weight:      Height:        Intake/Output Summary (Last 24 hours) at 06/06/2021 0901 Last data filed at 06/06/2021 0419 Gross per 24 hour  Intake 701.49 ml  Output --  Net 701.49 ml   Filed Weights   06/05/21 1450  Weight: 108.9 kg     Data Reviewed:   CBC: Recent Labs  Lab 06/05/21 1451  WBC 20.9*  HGB 13.5  HCT 40.9  MCV 92.1  PLT 245   Basic Metabolic Panel: Recent Labs  Lab 06/05/21 1451 06/05/21 2327  NA 132*  --   K 4.0  --   CL 99  --   CO2 24  --   GLUCOSE 215*  --   BUN 24*  --   CREATININE 1.75* 1.50*  CALCIUM 8.6*  --    GFR: Estimated Creatinine Clearance: 48 mL/min (A) (by C-G formula based on SCr of 1.5 mg/dL (H)). Liver Function Tests: Recent Labs  Lab 06/05/21 2032  AST 17  ALT 18  ALKPHOS 70  BILITOT 0.6  PROT 7.1  ALBUMIN 3.9   No results for input(s): LIPASE, AMYLASE in the last 168 hours. No results for input(s): AMMONIA in the last 168 hours. Coagulation Profile: Recent Labs  Lab 06/05/21 2327  INR 1.0   Cardiac Enzymes: No results for input(s): CKTOTAL, CKMB, CKMBINDEX, TROPONINI in the last 168 hours. BNP (last 3 results) No results for input(s): PROBNP in the last 8760 hours. HbA1C: Recent Labs    06/05/21 1456  HGBA1C 5.9*   CBG: Recent Labs  Lab 06/05/21 2053 06/06/21 0101 06/06/21 0812  GLUCAP 88 68* 81   Lipid Profile: No results for input(s): CHOL, HDL, LDLCALC, TRIG, CHOLHDL,  LDLDIRECT in the last 72 hours. Thyroid Function Tests: No results for input(s): TSH, T4TOTAL, FREET4, T3FREE, THYROIDAB in the last 72 hours. Anemia Panel: No results for input(s): VITAMINB12, FOLATE, FERRITIN, TIBC, IRON, RETICCTPCT in the last 72 hours. Sepsis Labs: Recent Labs  Lab 06/05/21 1456 06/05/21 2327 06/06/21 0636  PROCALCITON 1.47  --   --   LATICACIDVEN  --  0.9 1.4    Recent Results (from the past 240 hour(s))  Resp Panel by RT-PCR (Flu A&B, Covid) Nasopharyngeal Swab     Status: None   Collection Time: 06/05/21  8:32 PM   Specimen: Nasopharyngeal Swab; Nasopharyngeal(NP) swabs in vial transport medium  Result Value Ref Range Status   SARS Coronavirus 2 by RT PCR NEGATIVE NEGATIVE Final    Comment: (NOTE) SARS-CoV-2 target nucleic acids are NOT DETECTED.  The SARS-CoV-2 RNA is generally detectable in upper respiratory specimens during the acute phase of infection. The lowest concentration of SARS-CoV-2 viral copies this assay can detect  is 138 copies/mL. A negative result does not preclude SARS-Cov-2 infection and should not be used as the sole basis for treatment or other patient management decisions. A negative result may occur with  improper specimen collection/handling, submission of specimen other than nasopharyngeal swab, presence of viral mutation(s) within the areas targeted by this assay, and inadequate number of viral copies(<138 copies/mL). A negative result must be combined with clinical observations, patient history, and epidemiological information. The expected result is Negative.  Fact Sheet for Patients:  BloggerCourse.com  Fact Sheet for Healthcare Providers:  SeriousBroker.it  This test is no t yet approved or cleared by the [place] FDA and  has been authorized for detection and/or diagnosis of SARS-CoV-2 by FDA under an Emergency Use Authorization (EUA). This EUA will remain  in  effect (meaning this test can be used) for the duration of the COVID-19 declaration under Section 564(b)(1) of the Act, 21 U.S.C.section 360bbb-3(b)(1), unless the authorization is terminated  or revoked sooner.       Influenza A by PCR NEGATIVE NEGATIVE Final   Influenza B by PCR NEGATIVE NEGATIVE Final    Comment: (NOTE) The Xpert Xpress SARS-CoV-2/FLU/RSV plus assay is intended as an aid in the diagnosis of influenza from Nasopharyngeal swab specimens and should not be used as a sole basis for treatment. Nasal washings and aspirates are unacceptable for Xpert Xpress SARS-CoV-2/FLU/RSV testing.  Fact Sheet for Patients: BloggerCourse.com  Fact Sheet for Healthcare Providers: SeriousBroker.it  This test is not yet approved or cleared by the [place] FDA and has been authorized for detection and/or diagnosis of SARS-CoV-2 by FDA under an Emergency Use Authorization (EUA). This EUA will remain in effect (meaning this test can be used) for the duration of the COVID-19 declaration under Section 564(b)(1) of the Act, 21 U.S.C. section 360bbb-3(b)(1), unless the authorization is terminated or revoked.  Performed at [facility], [address] [place], [place] [address]   Blood culture (routine single)     Status: None (Preliminary result)   Collection Time: 06/05/21 11:27 PM   Specimen: BLOOD  Result Value Ref Range Status   Specimen Description BLOOD LEFT ANTECUBITAL  Final   Special Requests   Final    BOTTLES DRAWN AEROBIC AND ANAEROBIC Blood Culture adequate volume   Culture   Final    NO GROWTH < 12 HOURS Performed at [facility], [address] [place], [place] [address]    Report Status PENDING  Incomplete         Radiology Studies: CT Chest Wo Contrast  Result Date: 06/05/2021 CLINICAL DATA:  Persistent cough. EXAM: CT CHEST WITHOUT CONTRAST TECHNIQUE: Multidetector CT imaging of the  chest was performed following the standard protocol without IV contrast. COMPARISON:  Chest CT dated 01/16/2021. FINDINGS: Evaluation of this exam is limited in the absence of intravenous contrast. Cardiovascular: There is no cardiomegaly or pericardial effusion. Three-vessel coronary vascular calcification. Mild atherosclerotic calcification of the aortic arch. No aneurysmal dilatation. The central pulmonary arteries are grossly unremarkable. Mediastinum/Nodes: No hilar or mediastinal adenopathy. The esophagus is grossly unremarkable. No mediastinal fluid collection. Lungs/Pleura: Scattered ground-glass pulmonary nodules primarily involving the left lung and to a lesser degree right lung most consistent with atypical pneumonia. Aspiration is not excluded. Clinical correlation and follow-up to resolution recommended. No lobar consolidation, pleural effusion, or pneumothorax. The central airways are patent. Upper Abdomen: Postsurgical changes of gastric bypass. Mild irregularity of the liver contour may represent early changes of cirrhosis. Musculoskeletal: Degenerative changes  of the spine. No acute osseous pathology. IMPRESSION: 1. Multilobar pneumonia, likely atypical in etiology. 2. Aortic Atherosclerosis (ICD10-I70.0). Electronically Signed   By: Elgie Collard M.D.   On: 06/05/2021 21:12   DG Chest Portable 1 View  Result Date: 06/05/2021 CLINICAL DATA:  Per Triage: Pt via from [facility]. Pt c/o dizziness, grogginess and lightheadedness. Pt was placed on Plaxovid but has not been confirmed COVID +. Pt has been SOB upon exertion and dry cough Denies pain. Pt is AANDOX4 and NAD. EXAM: PORTABLE CHEST - 1 VIEW COMPARISON:  01/16/2021 FINDINGS: Interval improvement in the patchy airspace opacity seen previously. Some residual left infrahilar infiltrate or subsegmental atelectasis is noted. Heart size and mediastinal contours are within normal limits. No effusion.  No pneumothorax. Visualized bones unremarkable.  Surgical clips in the left upper abdomen. IMPRESSION: 1. Interval improvement in aeration with patchy left retrocardiac atelectasis or infiltrate. Electronically Signed   By: Corlis Leak M.D.   On: 06/05/2021 15:52        Scheduled Meds:  enoxaparin (LOVENOX) injection  0.5 mg/kg Subcutaneous Q24H   insulin aspart  0-15 Units Subcutaneous TID WC   Continuous Infusions:  sodium chloride 100 mL/hr at 06/06/21 0508   azithromycin Stopped (06/06/21 0419)   cefTRIAXone (ROCEPHIN)  IV Stopped (06/06/21 0137)     LOS: 1 day   Time spent= 35 mins     Joline Maxcy, MD [facility]  If 7PM-7AM, please contact night-coverage  06/06/2021, 9:01 AM

## 2021-06-06 NOTE — Evaluation (Signed)
Physical Therapy Evaluation Patient Details Name: Navdeep Fessenden MRN: [id number] DOB: 04/24/1966 Today's Date: 06/06/2021   History of Present Illness  Pt is a 55 y.o. F w/ PMH significant for HTN, stroke, depression, prediabetes, and prior hospitialization 4 months ago for respiratory failure 2/2 for pneumonia. Pt arrives to ED w/ concerns for dizziness w/ cough & SOB.  Clinical Impression  Pt alert in bed with her mother present throughout treatment. Pt oriented x 4 and states feeling better without sx of lightheadedness. Pt reports PLOF as independent with SPC or rollator for community distances secondary to instability following prior CVA, independent with ADLs, IADLs.  Pt is MOD-I w/ bed mobility and transfers for increased time. Pt provided RW for transfers and ambulation due to pt reaching for walls/IV pole with dynamic movement. No LOB with RW, min-guard with ambulation. Ambulated out of room bed <>restroom w/ min-guard, pt able to perform pericare independently. HHPT is recommended due to family support. Pt is close to her baseline functionality, but would benefit from further PT services to address deficits in function, mobility, and to return to PLOF as able.      Follow Up Recommendations Home health PT    Equipment Recommendations  Rolling walker with 5" wheels    Recommendations for Other Services       Precautions / Restrictions Precautions Precautions: Fall Restrictions Weight Bearing Restrictions: No      Mobility  Bed Mobility Overal bed mobility: Modified Independent             General bed mobility comments: Pt requires increased time supine <> sit    Transfers Overall transfer level: Modified independent Equipment used: Rolling walker (2 wheeled)             General transfer comment: Pt requires increased time for transfer, pt reports she needs to "get her bearings."  Ambulation/Gait Ambulation/Gait assistance: Min guard Gait Distance (Feet):  160 Feet Assistive device: Rolling walker (2 wheeled) Gait Pattern/deviations: Decreased stride length     General Gait Details: Min-gaurd for safety and cues for RW  Stairs            Wheelchair Mobility    Modified Rankin (Stroke Patients Only)       Balance Overall balance assessment: Needs assistance Sitting-balance support: Feet unsupported;Single extremity supported Sitting balance-Leahy Scale: Good       Standing balance-Leahy Scale: Fair Standing balance comment: Pt is able to stand statically without UE support but cannot tolerate challenge                             Pertinent Vitals/Pain Pain Assessment: No/denies pain    Home Living Family/patient expects to be discharged to:: Private residence Living Arrangements: Other relatives Available Help at Discharge: Family;Available 24 hours/day Type of Home: House Home Access: Stairs to enter Entrance Stairs-Rails: Right Entrance Stairs-Number of Steps: 4 Home Layout: One level Home Equipment: Cane - single point;Grab bars - tub/shower;Shower seat Additional Comments: Pt lives with her mother    Prior Function Level of Independence: Needs assistance   Gait / Transfers Assistance Needed: Pt uses SPC or rollator for community distances due to decreased balance. 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 Pt's mother does grocery shopping.        Hand Dominance  Dominant Hand: Right    Extremity/Trunk Assessment   Upper Extremity Assessment Upper Extremity Assessment: Overall WFL for tasks assessed    Lower Extremity Assessment Lower Extremity Assessment: Overall WFL for tasks assessed (MMT bilat hip flexion 4/5; 5/5 bilat knee ext, knee flex, dorsiflexion; SILT throughout LEs)       Communication   Communication: No difficulties   Cognition Arousal/Alertness: Awake/alert Behavior During Therapy: WFL for tasks assessed/performed Overall Cognitive Status: Within Functional Limits for tasks assessed                                 General Comments: AOx4      General Comments      Exercises Other Exercises Other Exercises: Bed > toilet w/ RW, min-gaurd for safety. Pt was able to perform pericare independently   Assessment/Plan    PT Assessment Patient needs continued PT services  PT Problem List Decreased strength;Decreased range of motion;Decreased activity tolerance;Decreased balance;Decreased mobility       PT Treatment Interventions Balance training;Gait training;Stair training;Functional mobility training;Therapeutic activities;Therapeutic exercise;Neuromuscular re-education    PT Goals (Current goals can be found in the Care Plan section)  Acute Rehab PT Goals Patient Stated Goal: To go home PT Goal Formulation: With patient Time For Goal Achievement: 06/20/21 Potential to Achieve Goals: Good    Frequency Min 2X/week   Barriers to discharge        Co-evaluation               AM-PAC PT "6 Clicks" Mobility  Outcome Measure Help needed turning from your back to your side while in a flat bed without using bedrails?: None Help needed moving from lying on your back to sitting on the side of a flat bed without using bedrails?: None Help needed moving to and from a bed to a chair (including a wheelchair)?: None Help needed standing up from a chair using your arms (e.g., wheelchair or bedside chair)?: A Little Help needed to walk in hospital room?: A Little Help needed climbing 3-5 steps with a railing? : A Little 6 Click Score: 21    End of Session Equipment Utilized During Treatment: Gait belt Activity Tolerance: Patient tolerated treatment well Patient left: in bed;with family/visitor present;with call bell/phone within reach Nurse Communication: Mobility status PT Visit  Diagnosis: Unsteadiness on feet (R26.81);Muscle weakness (generalized) (M62.81)    Time: 4970-2637 PT Time Calculation (min) (ACUTE ONLY): 44 min   Charges:             [employer], SPT

## 2021-06-06 NOTE — ED Notes (Signed)
Pt provided sprite, crackers, apple sauce for low sugar.

## 2021-06-06 NOTE — ED Notes (Signed)
Pt provided coffee  

## 2021-06-06 NOTE — ED Notes (Signed)
Report to [employer] Pt provided sprite for sugar

## 2021-06-07 LAB — CBC
HCT: 38.9 % (ref 36.0–46.0)
Hemoglobin: 12.5 g/dL (ref 12.0–15.0)
MCH: 30 pg (ref 26.0–34.0)
MCHC: 32.1 g/dL (ref 30.0–36.0)
MCV: 93.3 fL (ref 80.0–100.0)
Platelets: 214 10*3/uL (ref 150–400)
RBC: 4.17 MIL/uL (ref 3.87–5.11)
RDW: 17.2 % — ABNORMAL HIGH (ref 11.5–15.5)
WBC: 8.7 10*3/uL (ref 4.0–10.5)
nRBC: 0 % (ref 0.0–0.2)

## 2021-06-07 LAB — GLUCOSE, CAPILLARY
Glucose-Capillary: 82 mg/dL (ref 70–99)
Glucose-Capillary: 99 mg/dL (ref 70–99)
Glucose-Capillary: 201 mg/dL — ABNORMAL HIGH (ref 70–99)

## 2021-06-07 LAB — BASIC METABOLIC PANEL
Anion gap: 9 (ref 5–15)
BUN: 15 mg/dL (ref 6–20)
CO2: 25 mmol/L (ref 22–32)
Calcium: 8.8 mg/dL — ABNORMAL LOW (ref 8.9–10.3)
Chloride: 107 mmol/L (ref 98–111)
Creatinine, Ser: 0.67 mg/dL (ref 0.44–1.00)
GFR, Estimated: >60 mL/min (ref >60)
Glucose, Bld: 94 mg/dL (ref 70–99)
Potassium: 4.1 mmol/L (ref 3.5–5.1)
Sodium: 141 mmol/L (ref 135–145)

## 2021-06-07 LAB — MAGNESIUM: Magnesium: 2.1 mg/dL (ref 1.7–2.4)

## 2021-06-07 MED ORDER — LEVOFLOXACIN 500 MG PO TABS: 500.0000 mg | ORAL_TABLET | Freq: Every day | ORAL | 0 refills | Status: AC

## 2021-06-07 NOTE — TOC Transition Note (Signed)
Transition of Care [facility]) - CM/SW Discharge Note   Patient Details  Name: Terri Wood MRN: [id number] Date of Birth: December 06, 1965  [facility] CM/SW Contact:  Caryn Section, RN Phone Number: 06/07/2021, 1:37 PM   Clinical Narrative:   Patient agrees to outpatient PT, states she has reliable transportation.  Req sent to hospitalist and signed.          Patient Goals and CMS Choice        Discharge Placement                       Discharge Plan and Services                                     Social Determinants of Health (SDOH) Interventions     Readmission Risk Interventions No flowsheet data found.

## 2021-06-07 NOTE — Plan of Care (Signed)
Patient alert and oriented x 4, complains on of chronic pain relieved with prn medications. Vitals stable, no respiratory distress on room air. Antibiotic therapy initiated with no adverse reactions. Stable condition at end of shift, will continue to monitor.  Problem: Education: Goal: Knowledge of General Education information will improve Description: Including pain rating scale, medication(s)/side effects and non-pharmacologic comfort measures Outcome: Progressing   Problem: Clinical Measurements: Goal: Ability to maintain clinical measurements within normal limits will improve Outcome: Progressing Goal: Will remain free from infection Outcome: Progressing Goal: Diagnostic test results will improve Outcome: Progressing Goal: Respiratory complications will improve Outcome: Progressing Goal: Cardiovascular complication will be avoided Outcome: Progressing   Problem: Activity: Goal: Risk for activity intolerance will decrease Outcome: Progressing   Problem: Elimination: Goal: Will not experience complications related to bowel motility Outcome: Progressing Goal: Will not experience complications related to urinary retention Outcome: Progressing   Problem: Pain Managment: Goal: General experience of comfort will improve Outcome: Progressing   Problem: Safety: Goal: Ability to remain free from injury will improve Outcome: Progressing   Problem: Skin Integrity: Goal: Risk for impaired skin integrity will decrease Outcome: Progressing

## 2021-06-07 NOTE — Plan of Care (Signed)
  Problem: Education: Goal: Knowledge of General Education information will improve Description: Including pain rating scale, medication(s)/side effects and non-pharmacologic comfort measures Outcome: Completed/Met   Problem: Health Behavior/Discharge Planning: Goal: Ability to manage health-related needs will improve Outcome: Completed/Met   Problem: Clinical Measurements: Goal: Ability to maintain clinical measurements within normal limits will improve Outcome: Completed/Met Goal: Will remain free from infection Outcome: Completed/Met Goal: Diagnostic test results will improve Outcome: Completed/Met Goal: Respiratory complications will improve Outcome: Completed/Met Goal: Cardiovascular complication will be avoided Outcome: Completed/Met   Problem: Nutrition: Goal: Adequate nutrition will be maintained Outcome: Completed/Met   Problem: Coping: Goal: Level of anxiety will decrease Outcome: Completed/Met   Problem: Elimination: Goal: Will not experience complications related to bowel motility Outcome: Completed/Met Goal: Will not experience complications related to urinary retention Outcome: Completed/Met   Problem: Safety: Goal: Ability to remain free from injury will improve Outcome: Completed/Met   Problem: Pain Managment: Goal: General experience of comfort will improve Outcome: Completed/Met   Problem: Skin Integrity: Goal: Risk for impaired skin integrity will decrease Outcome: Completed/Met

## 2021-06-07 NOTE — TOC Progression Note (Addendum)
Transition of Care [facility]) - Progression Note    Patient Details  Name: Remmi Armenteros MRN: [id number] Date of Birth: Jun 24, 1966  [facility]) CM/SW Contact  Caryn Section, RN Phone Number: 06/07/2021, 11:20 AM  Clinical Narrative:   Patient lives at home with her mother, who can assist if necessary.  No concerns with transportation to appointments or obtaining medications.  Patient states she is able to take medications as directed.  Patient had home health about 1 year ago, MD requesting home health with PT, reached out to [facility] as that was her previous provider, Awaiting response.    Patient states she has no further needs from [facility] at this time.  TOC contact information given, TOC to follow to discharge.  Addendum:  Patient has rolling walker at home, declines offer to provide her with one.        Expected Discharge Plan and Services           Expected Discharge Date: 06/07/21                                     Social Determinants of Health (SDOH) Interventions    Readmission Risk Interventions No flowsheet data found.

## 2021-06-07 NOTE — Discharge Summary (Signed)
Physician Discharge Summary  Terri Wood ION:[id number] DOB: 11/28/1965 DOA: 06/05/2021  PCP: Miki Kins, FNP  Admit date: 06/05/2021 Discharge date: 06/07/2021  Admitted From: Home Disposition: Home  Recommendations for Outpatient Follow-up:  Follow up with PCP in 1-2 weeks Please obtain BMP/CBC in one week your next doctors visit.  Take oral Levaquin as prescribed Will need follow-up two-view chest x-ray in 6-8 weeks  Home Health: Equipment/Devices: Discharge Condition: Stable CODE STATUS:  Diet recommendation:   Brief/Interim Summary: 55 y.o. female with medical history significant for HTN, stroke, class III obesity, depression, prediabetes, hospitalization 4 months ago for respiratory failure secondary to pneumonia, who was recently being treated by PCP with paxlovidfor suspected COVID but with negative rapids and PCR, who presents to the ED with a main complaint of dizziness and lightheadedness, persistent dry cough and shortness of breath on exertion.  CT chest noted to have multifocal pneumonia, elevated WBC and procalcitonin 1.4.  COVID and flu PCR negative.  Empirically started on IV Rocephin and azithromycin.  Upon discharge she was transitioned to oral Levaquin for 5 more days to complete 7-day course.  She was advised to repeat chest x-ray in 6-8 weeks.  Stable for discharge today without any acute issues.     Assessment & Plan:   Principal Problem:   Multifocal pneumonia Active Problems:   Essential hypertension   CVA, old, hemiparesis (HCC)   Obesity, Class III, BMI 40-49.9 (morbid obesity) (HCC)   Depression, unspecified   AKI (acute kidney injury) (HCC)   Borderline hyperglycemia   Generalized weakness       Multifocal pneumonia, non-COVID, recurrent -Suspect superimposed bacterial pneumonia with recent COVID-19 infection.  COVID-19/flu PCR negative.  UA-negative - Procalcitonin 1.4.  She will be transition to oral Levaquin for 5 more days.  Advised to  get repeat chest x-ray in 6-8 weeks.       AKI (acute kidney injury) (HCC) - Baseline creatinine 0.8, admission creatinine 1.75 which resolved with IV fluids.  Creatinine on discharge is 0.67     Borderline hyperglycemia, with history of diabetes -A1c 5.9.  Insulin sliding scale and Accu-Chek     Generalized weakness/lightheadedness - Resolved with IV fluids     Essential hypertension - Resume home lisinopril   History of CVA - Continue aspirin, Plavix and atorvastatin     Obesity, Class III, BMI 40-49.9 (morbid obesity) (HCC) - Complicating factor to overall prognosis and care     Depression, unspecified - Continue Wellbutrin  Chronic musculoskeletal pain - pain control.       Body mass index is 46.87 kg/m.         Discharge Diagnoses:  Principal Problem:   Multifocal pneumonia Active Problems:   Essential hypertension   CVA, old, hemiparesis (HCC)   Obesity, Class III, BMI 40-49.9 (morbid obesity) (HCC)   Depression, unspecified   AKI (acute kidney injury) (HCC)   Borderline hyperglycemia   Generalized weakness      Consultations: None  Subjective: Feels great and back to baseline, no other complaints  Discharge Exam: Vitals:   06/07/21 0853 06/07/21 0935  BP: 118/60 122/70  Pulse: 69   Resp: 16   Temp: 97.6 F (36.4 C)   SpO2: 96%    Vitals:   06/06/21 2105 06/07/21 0407 06/07/21 0853 06/07/21 0935  BP: (!) 111/52 112/66 118/60 122/70  Pulse: 79 62 69   Resp: 16 16 16    Temp: 98.4 F (36.9 C) (!) 97.5 F (36.4 C) 97.6 F (36.4  C)   TempSrc: Oral Oral Oral   SpO2: 99% 99% 96%   Weight:      Height:        General: Pt is alert, awake, not in acute distress Cardiovascular: RRR, S1/S2 +, no rubs, no gallops Respiratory: CTA bilaterally, no wheezing, no rhonchi Abdominal: Soft, NT, ND, bowel sounds + Extremities: no edema, no cyanosis  Discharge Instructions   Allergies as of 06/07/2021       Reactions   Penicillins Hives    Did it involve swelling of the face/tongue/throat, SOB, or low BP? Yes Did it involve sudden or severe rash/hives, skin peeling, or any reaction on the inside of your mouth or nose? No Did you need to seek medical attention at a hospital or doctor's office? No When did it last happen? Within the past 10 years  If all above answers are "NO", may proceed with cephalosporin use.        Medication List     TAKE these medications    albuterol 108 (90 Base) MCG/ACT inhaler Commonly known as: VENTOLIN HFA Inhale 2 puffs into the lungs every 4 (four) hours as needed for wheezing.   amLODipine 10 MG tablet Commonly known as: NORVASC Hold until followup with your outpatient doctor because your blood pressure has been on the low side.   Apple Cider Vinegar Plus Tabs Take 1 tablet by mouth 2 (two) times daily.   atorvastatin 80 MG tablet Commonly known as: LIPITOR Take 80 mg by mouth daily.   buPROPion 150 MG 24 hr tablet Commonly known as: WELLBUTRIN XL Take 150 mg by mouth daily.   celecoxib 200 MG capsule Commonly known as: CELEBREX Take 200 mg by mouth daily.   DULoxetine 60 MG capsule Commonly known as: CYMBALTA Take 60 mg by mouth daily.   escitalopram 10 MG tablet Commonly known as: LEXAPRO Take 10 mg by mouth daily.   Gemtesa 75 MG Tabs Generic drug: Vibegron Take 1 tablet by mouth daily as needed.   HYDROcodone-acetaminophen 5-325 MG tablet Commonly known as: NORCO/VICODIN Take 1 tablet by mouth every 6 (six) hours as needed for moderate pain.   levofloxacin 500 MG tablet Commonly known as: Levaquin Take 1 tablet (500 mg total) by mouth daily for 5 days.   lisinopril-hydrochlorothiazide 20-25 MG tablet Commonly known as: ZESTORETIC Take 1 tablet by mouth daily.   MULTI FOR HER 50+ PO Take 1 tablet by mouth daily.   Omega 3 1000 MG Caps Take 1,000 mg by mouth daily with breakfast.   omeprazole 40 MG capsule Commonly known as: PRILOSEC Take 40 mg by  mouth daily.   OZEMPIC (2 MG/DOSE) [place] Inject 2 mg into the skin once a week.   PARoxetine 20 MG tablet Commonly known as: PAXIL Take 20 mg by mouth daily.   Paxlovid (300/100) 20 x 150 MG & 10 x 100MG  Tbpk Generic drug: nirmatrelvir & ritonavir Take 1-2 tablets by mouth See admin instructions.   pregabalin 150 MG capsule Commonly known as: LYRICA Take 150 mg by mouth 2 (two) times daily.   pregabalin 200 MG capsule Commonly known as: LYRICA Take 200 mg by mouth 2 (two) times daily.   tiZANidine 4 MG tablet Commonly known as: ZANAFLEX Take 4 mg by mouth every 6 (six) hours as needed for muscle spasms.   vortioxetine HBr 5 MG Tabs tablet Commonly known as: TRINTELLIX Take 5 mg by mouth daily.        Follow-up Information  Miki Kins, FNP. Call on 06/14/2021.   Specialty: Family Medicine Why: at 1030 Contact information: [address] [place] [address] [phone]                Allergies  Allergen Reactions   Penicillins Hives    Did it involve swelling of the face/tongue/throat, SOB, or low BP? Yes Did it involve sudden or severe rash/hives, skin peeling, or any reaction on the inside of your mouth or nose? No Did you need to seek medical attention at a hospital or doctor's office? No When did it last happen? Within the past 10 years  If all above answers are "NO", may proceed with cephalosporin use.     You were cared for by a hospitalist during your hospital stay. If you have any questions about your discharge medications or the care you received while you were in the hospital after you are discharged, you can call the unit and asked to speak with the hospitalist on call if the hospitalist that took care of you is not available. Once you are discharged, your primary care physician will handle any further medical issues. Please note that no refills for any discharge medications will be authorized once you are discharged, as it is imperative  that you return to your primary care physician (or establish a relationship with a primary care physician if you do not have one) for your aftercare needs so that they can reassess your need for medications and monitor your lab values.   Procedures/Studies: CT Chest Wo Contrast  Result Date: 06/05/2021 CLINICAL DATA:  Persistent cough. EXAM: CT CHEST WITHOUT CONTRAST TECHNIQUE: Multidetector CT imaging of the chest was performed following the standard protocol without IV contrast. COMPARISON:  Chest CT dated 01/16/2021. FINDINGS: Evaluation of this exam is limited in the absence of intravenous contrast. Cardiovascular: There is no cardiomegaly or pericardial effusion. Three-vessel coronary vascular calcification. Mild atherosclerotic calcification of the aortic arch. No aneurysmal dilatation. The central pulmonary arteries are grossly unremarkable. Mediastinum/Nodes: No hilar or mediastinal adenopathy. The esophagus is grossly unremarkable. No mediastinal fluid collection. Lungs/Pleura: Scattered ground-glass pulmonary nodules primarily involving the left lung and to a lesser degree right lung most consistent with atypical pneumonia. Aspiration is not excluded. Clinical correlation and follow-up to resolution recommended. No lobar consolidation, pleural effusion, or pneumothorax. The central airways are patent. Upper Abdomen: Postsurgical changes of gastric bypass. Mild irregularity of the liver contour may represent early changes of cirrhosis. Musculoskeletal: Degenerative changes of the spine. No acute osseous pathology. IMPRESSION: 1. Multilobar pneumonia, likely atypical in etiology. 2. Aortic Atherosclerosis (ICD10-I70.0). Electronically Signed   By: Elgie Collard M.D.   On: 06/05/2021 21:12   CT KNEE RIGHT WO CONTRAST  Result Date: 05/26/2021 CLINICAL DATA:  Right knee pain, preop for right knee replacement EXAM: CT OF THE right KNEE WITHOUT CONTRAST TECHNIQUE: Multidetector CT imaging of the  right knee was performed according to the standard protocol. Multiplanar CT image reconstructions were also generated. COMPARISON:  None. FINDINGS: Bones/Joint/Cartilage Limited axial images of the hip demonstrate mild degenerative changes. Limited axial images of the right ankle demonstrate mild tibiotalar degenerative change and chronic insertional Achilles tendinopathy. There is tricompartment osteoarthritis of the right knee, with high-grade osteochondral defect of the posterior weight-bearing medial femoral condyle. No fragment displacement. Adjacent mild subchondral lucency and curvilinear sclerosis. There is moderate size joint effusion. Ligaments Suboptimally assessed by CT. Muscles and Tendons No muscle atrophy. Soft tissues Mild soft tissue swelling anteriorly at the knee.  Medial superficial venous varicosities. IMPRESSION: Tricompartment osteoarthritis of the right knee with high-grade osteochondral defect of the posterior weight-bearing medial femoral condyle. No displaced fragment but this it is likely unstable. Adjacent subchondral lucency and likely subchondral fracture. Moderate size joint effusion. Electronically Signed   By: Caprice Renshaw M.D.   On: 05/26/2021 16:24   DG Chest Portable 1 View  Result Date: 06/05/2021 CLINICAL DATA:  Per Triage: Pt via from [facility] Pt c/o dizziness, grogginess and lightheadedness. Pt was placed on Plaxovid but has not been confirmed COVID +. Pt has been SOB upon exertion and dry cough Denies pain. Pt is AANDOX4 and NAD. EXAM: PORTABLE CHEST - 1 VIEW COMPARISON:  01/16/2021 FINDINGS: Interval improvement in the patchy airspace opacity seen previously. Some residual left infrahilar infiltrate or subsegmental atelectasis is noted. Heart size and mediastinal contours are within normal limits. No effusion.  No pneumothorax. Visualized bones unremarkable. Surgical clips in the left upper abdomen. IMPRESSION: 1. Interval improvement in aeration with patchy left retrocardiac  atelectasis or infiltrate. Electronically Signed   By: Corlis Leak M.D.   On: 06/05/2021 15:52     The results of significant diagnostics from this hospitalization (including imaging, microbiology, ancillary and laboratory) are listed below for reference.     Microbiology: Recent Results (from the past 240 hour(s))  Resp Panel by RT-PCR (Flu A&B, Covid) Nasopharyngeal Swab     Status: None   Collection Time: 06/05/21  8:32 PM   Specimen: Nasopharyngeal Swab; Nasopharyngeal(NP) swabs in vial transport medium  Result Value Ref Range Status   SARS Coronavirus 2 by RT PCR NEGATIVE NEGATIVE Final    Comment: (NOTE) SARS-CoV-2 target nucleic acids are NOT DETECTED.  The SARS-CoV-2 RNA is generally detectable in upper respiratory specimens during the acute phase of infection. The lowest concentration of SARS-CoV-2 viral copies this assay can detect is 138 copies/mL. A negative result does not preclude SARS-Cov-2 infection and should not be used as the sole basis for treatment or other patient management decisions. A negative result may occur with  improper specimen collection/handling, submission of specimen other than nasopharyngeal swab, presence of viral mutation(s) within the areas targeted by this assay, and inadequate number of viral copies(<138 copies/mL). A negative result must be combined with clinical observations, patient history, and epidemiological information. The expected result is Negative.  Fact Sheet for Patients:  BloggerCourse.com  Fact Sheet for Healthcare Providers:  SeriousBroker.it  This test is no t yet approved or cleared by the [place] FDA and  has been authorized for detection and/or diagnosis of SARS-CoV-2 by FDA under an Emergency Use Authorization (EUA). This EUA will remain  in effect (meaning this test can be used) for the duration of the COVID-19 declaration under Section 564(b)(1) of the Act,  21 U.S.C.section 360bbb-3(b)(1), unless the authorization is terminated  or revoked sooner.       Influenza A by PCR NEGATIVE NEGATIVE Final   Influenza B by PCR NEGATIVE NEGATIVE Final    Comment: (NOTE) The Xpert Xpress SARS-CoV-2/FLU/RSV plus assay is intended as an aid in the diagnosis of influenza from Nasopharyngeal swab specimens and should not be used as a sole basis for treatment. Nasal washings and aspirates are unacceptable for Xpert Xpress SARS-CoV-2/FLU/RSV testing.  Fact Sheet for Patients: BloggerCourse.com  Fact Sheet for Healthcare Providers: SeriousBroker.it  This test is not yet approved or cleared by the [place] FDA and has been authorized for detection and/or diagnosis of SARS-CoV-2 by FDA under an Emergency Use Authorization (  EUA). This EUA will remain in effect (meaning this test can be used) for the duration of the COVID-19 declaration under Section 564(b)(1) of the Act, 21 U.S.C. section 360bbb-3(b)(1), unless the authorization is terminated or revoked.  Performed at [facility], [address] [place], [place] [address]   Blood culture (routine single)     Status: None (Preliminary result)   Collection Time: 06/05/21 11:27 PM   Specimen: BLOOD  Result Value Ref Range Status   Specimen Description BLOOD LEFT ANTECUBITAL  Final   Special Requests   Final    BOTTLES DRAWN AEROBIC AND ANAEROBIC Blood Culture adequate volume   Culture   Final    NO GROWTH 2 DAYS Performed at [facility], [address] [place], [place] [address]    Report Status PENDING  Incomplete     Labs: BNP (last 3 results) Recent Labs    01/16/21 0726  BNP 36.0   Basic Metabolic Panel: Recent Labs  Lab 06/05/21 1451 06/05/21 2327 06/07/21 0304  NA 132*  --  141  K 4.0  --  4.1  CL 99  --  107  CO2 24  --  25  GLUCOSE 215*  --  94  BUN 24*  --  15  CREATININE 1.75* 1.50* 0.67   CALCIUM 8.6*  --  8.8*  MG  --   --  2.1   Liver Function Tests: Recent Labs  Lab 06/05/21 2032  AST 17  ALT 18  ALKPHOS 70  BILITOT 0.6  PROT 7.1  ALBUMIN 3.9   No results for input(s): LIPASE, AMYLASE in the last 168 hours. No results for input(s): AMMONIA in the last 168 hours. CBC: Recent Labs  Lab 06/05/21 1451 06/07/21 0304  WBC 20.9* 8.7  HGB 13.5 12.5  HCT 40.9 38.9  MCV 92.1 93.3  PLT 245 214   Cardiac Enzymes: No results for input(s): CKTOTAL, CKMB, CKMBINDEX, TROPONINI in the last 168 hours. BNP: Invalid input(s): POCBNP CBG: Recent Labs  Lab 06/06/21 1710 06/06/21 2101 06/06/21 2349 06/07/21 0404 06/07/21 0758  GLUCAP 63* 145* 134* 82 99   D-Dimer No results for input(s): DDIMER in the last 72 hours. Hgb A1c Recent Labs    06/05/21 1456  HGBA1C 5.9*   Lipid Profile No results for input(s): CHOL, HDL, LDLCALC, TRIG, CHOLHDL, LDLDIRECT in the last 72 hours. Thyroid function studies No results for input(s): TSH, T4TOTAL, T3FREE, THYROIDAB in the last 72 hours.  Invalid input(s): FREET3 Anemia work up No results for input(s): VITAMINB12, FOLATE, FERRITIN, TIBC, IRON, RETICCTPCT in the last 72 hours. Urinalysis    Component Value Date/Time   COLORURINE YELLOW 06/06/2021 0055   APPEARANCEUR CLEAR 06/06/2021 0055   LABSPEC 1.010 06/06/2021 0055   PHURINE 6.0 06/06/2021 0055   GLUCOSEU NEGATIVE 06/06/2021 0055   HGBUR NEGATIVE 06/06/2021 0055   BILIRUBINUR NEGATIVE 06/06/2021 0055   KETONESUR NEGATIVE 06/06/2021 0055   PROTEINUR NEGATIVE 06/06/2021 0055   NITRITE NEGATIVE 06/06/2021 0055   LEUKOCYTESUR NEGATIVE 06/06/2021 0055   Sepsis Labs Invalid input(s): PROCALCITONIN,  WBC,  LACTICIDVEN Microbiology Recent Results (from the past 240 hour(s))  Resp Panel by RT-PCR (Flu A&B, Covid) Nasopharyngeal Swab     Status: None   Collection Time: 06/05/21  8:32 PM   Specimen: Nasopharyngeal Swab; Nasopharyngeal(NP) swabs in vial transport  medium  Result Value Ref Range Status   SARS Coronavirus 2 by RT PCR NEGATIVE NEGATIVE Final    Comment: (NOTE) SARS-CoV-2 target nucleic acids  are NOT DETECTED.  The SARS-CoV-2 RNA is generally detectable in upper respiratory specimens during the acute phase of infection. The lowest concentration of SARS-CoV-2 viral copies this assay can detect is 138 copies/mL. A negative result does not preclude SARS-Cov-2 infection and should not be used as the sole basis for treatment or other patient management decisions. A negative result may occur with  improper specimen collection/handling, submission of specimen other than nasopharyngeal swab, presence of viral mutation(s) within the areas targeted by this assay, and inadequate number of viral copies(<138 copies/mL). A negative result must be combined with clinical observations, patient history, and epidemiological information. The expected result is Negative.  Fact Sheet for Patients:  BloggerCourse.com  Fact Sheet for Healthcare Providers:  SeriousBroker.it  This test is no t yet approved or cleared by the [place] FDA and  has been authorized for detection and/or diagnosis of SARS-CoV-2 by FDA under an Emergency Use Authorization (EUA). This EUA will remain  in effect (meaning this test can be used) for the duration of the COVID-19 declaration under Section 564(b)(1) of the Act, 21 U.S.C.section 360bbb-3(b)(1), unless the authorization is terminated  or revoked sooner.       Influenza A by PCR NEGATIVE NEGATIVE Final   Influenza B by PCR NEGATIVE NEGATIVE Final    Comment: (NOTE) The Xpert Xpress SARS-CoV-2/FLU/RSV plus assay is intended as an aid in the diagnosis of influenza from Nasopharyngeal swab specimens and should not be used as a sole basis for treatment. Nasal washings and aspirates are unacceptable for Xpert Xpress SARS-CoV-2/FLU/RSV testing.  Fact Sheet for  Patients: BloggerCourse.com  Fact Sheet for Healthcare Providers: SeriousBroker.it  This test is not yet approved or cleared by the [place] FDA and has been authorized for detection and/or diagnosis of SARS-CoV-2 by FDA under an Emergency Use Authorization (EUA). This EUA will remain in effect (meaning this test can be used) for the duration of the COVID-19 declaration under Section 564(b)(1) of the Act, 21 U.S.C. section 360bbb-3(b)(1), unless the authorization is terminated or revoked.  Performed at [facility], [address] [place], [place] [address]   Blood culture (routine single)     Status: None (Preliminary result)   Collection Time: 06/05/21 11:27 PM   Specimen: BLOOD  Result Value Ref Range Status   Specimen Description BLOOD LEFT ANTECUBITAL  Final   Special Requests   Final    BOTTLES DRAWN AEROBIC AND ANAEROBIC Blood Culture adequate volume   Culture   Final    NO GROWTH 2 DAYS Performed at [facility], [address], [place], [place] [address]    Report Status PENDING  Incomplete     Time coordinating discharge:  I have spent 35 minutes face to face with the patient and on the ward discussing the patients care, assessment, plan and disposition with other care givers. >50% of the time was devoted counseling the patient about the risks and benefits of treatment/Discharge disposition and coordinating care.   SIGNED:   Dimple Nanas, MD  [facility] 06/07/2021, 11:49 AM   If 7PM-7AM, please contact night-coverage

## 2021-06-10 LAB — CULTURE, BLOOD (SINGLE)
Culture: NO GROWTH
Special Requests: ADEQUATE

## 2021-06-13 ENCOUNTER — Other Ambulatory Visit: Payer: Self-pay | Admitting: Orthopedic Surgery

## 2021-06-30 ENCOUNTER — Ambulatory Visit
Admission: RE | Admit: 2021-06-30 | Discharge: 2021-06-30 | Disposition: A | Discharge status: Home / Self Care | Payer: Medicaid Other | Source: Ambulatory Visit | Attending: Urgent Care | Admitting: Urgent Care

## 2021-06-30 ENCOUNTER — Encounter
Admission: RE | Admit: 2021-06-30 | Discharge: 2021-06-30 | Disposition: A | Discharge status: Home / Self Care | Payer: Medicaid Other | Source: Ambulatory Visit | Attending: Orthopedic Surgery | Admitting: Orthopedic Surgery

## 2021-06-30 ENCOUNTER — Other Ambulatory Visit: Payer: Self-pay

## 2021-06-30 DIAGNOSIS — Z01818 Encounter for other preprocedural examination: Secondary | ICD-10-CM

## 2021-06-30 DIAGNOSIS — Z8701 Personal history of pneumonia (recurrent): Secondary | ICD-10-CM | POA: Diagnosis present

## 2021-06-30 HISTORY — DX: Hyperlipidemia, unspecified: E78.5

## 2021-06-30 HISTORY — DX: Depression, unspecified: F32.A

## 2021-06-30 HISTORY — DX: Prediabetes: R73.03

## 2021-06-30 HISTORY — DX: Pneumonia, unspecified organism: J18.9

## 2021-06-30 HISTORY — DX: Gastro-esophageal reflux disease without esophagitis: K21.9

## 2021-06-30 LAB — URINALYSIS, ROUTINE W REFLEX MICROSCOPIC
Bacteria, UA: NONE SEEN
Bilirubin Urine: NEGATIVE
Glucose, UA: NEGATIVE mg/dL
Ketones, ur: NEGATIVE mg/dL
Leukocytes,Ua: NEGATIVE
Nitrite: NEGATIVE
Protein, ur: NEGATIVE mg/dL
Specific Gravity, Urine: 1.01 (ref 1.005–1.030)
pH: 6 (ref 5.0–8.0)

## 2021-06-30 LAB — TYPE AND SCREEN
ABO/RH(D): A NEG
Antibody Screen: NEGATIVE

## 2021-06-30 LAB — CBC WITH DIFFERENTIAL/PLATELET
Abs Immature Granulocytes: 0.04 10*3/uL (ref 0.00–0.07)
Basophils Absolute: 0.1 10*3/uL (ref 0.0–0.1)
Basophils Relative: 1 %
Eosinophils Absolute: 0.3 10*3/uL (ref 0.0–0.5)
Eosinophils Relative: 2 %
HCT: 39.4 % (ref 36.0–46.0)
Hemoglobin: 13.2 g/dL (ref 12.0–15.0)
Immature Granulocytes: 0 %
Lymphocytes Relative: 16 %
Lymphs Abs: 2.2 10*3/uL (ref 0.7–4.0)
MCH: 30.8 pg (ref 26.0–34.0)
MCHC: 33.5 g/dL (ref 30.0–36.0)
MCV: 92.1 fL (ref 80.0–100.0)
Monocytes Absolute: 1.2 10*3/uL — ABNORMAL HIGH (ref 0.1–1.0)
Monocytes Relative: 9 %
Neutro Abs: 9.7 10*3/uL — ABNORMAL HIGH (ref 1.7–7.7)
Neutrophils Relative %: 72 %
Platelets: 270 10*3/uL (ref 150–400)
RBC: 4.28 MIL/uL (ref 3.87–5.11)
RDW: 15.9 % — ABNORMAL HIGH (ref 11.5–15.5)
WBC: 13.4 10*3/uL — ABNORMAL HIGH (ref 4.0–10.5)
nRBC: 0 % (ref 0.0–0.2)

## 2021-06-30 LAB — COMPREHENSIVE METABOLIC PANEL
ALT: 22 U/L (ref 0–44)
AST: 21 U/L (ref 15–41)
Albumin: 4 g/dL (ref 3.5–5.0)
Alkaline Phosphatase: 75 U/L (ref 38–126)
Anion gap: 7 (ref 5–15)
BUN: 21 mg/dL — ABNORMAL HIGH (ref 6–20)
CO2: 29 mmol/L (ref 22–32)
Calcium: 8.9 mg/dL (ref 8.9–10.3)
Chloride: 100 mmol/L (ref 98–111)
Creatinine, Ser: 0.63 mg/dL (ref 0.44–1.00)
GFR, Estimated: >60 mL/min (ref >60)
Glucose, Bld: 88 mg/dL (ref 70–99)
Potassium: 4 mmol/L (ref 3.5–5.1)
Sodium: 136 mmol/L (ref 135–145)
Total Bilirubin: 0.5 mg/dL (ref 0.3–1.2)
Total Protein: 7.2 g/dL (ref 6.5–8.1)

## 2021-06-30 LAB — SURGICAL PCR SCREEN
MRSA, PCR: NEGATIVE
Staphylococcus aureus: NEGATIVE

## 2021-06-30 NOTE — Patient Instructions (Addendum)
Your procedure is scheduled on: Tuesday, September 27 Report to the Registration Desk on the 1st floor of the [facility] To find out your arrival time, please call [phone] between 1PM - 3PM on: Monday, September 26  REMEMBER: Instructions that are not followed completely may result in serious medical risk, up to and including death; or upon the discretion of your surgeon and anesthesiologist your surgery may need to be rescheduled.  Do not eat food after midnight the night before surgery.  No gum chewing, lozengers or hard candies.  You may however, drink CLEAR liquids up to 2 hours before you are scheduled to arrive for your surgery. Do not drink anything within 2 hours of your scheduled arrival time.  Clear liquids include: - water  - apple juice without pulp - gatorade (not RED, PURPLE, OR BLUE) - black coffee or tea (Do NOT add milk or creamers to the coffee or tea) Do NOT drink anything that is not on this list.  In addition, your doctor has ordered for you to drink the provided  Ensure Pre-Surgery Clear Carbohydrate Drink  Drinking this carbohydrate drink up to two hours before surgery helps to reduce insulin resistance and improve patient outcomes. Please complete drinking 2 hours prior to scheduled arrival time.  TAKE THESE MEDICATIONS THE MORNING OF SURGERY WITH A SIP OF WATER:  Bupropion (wellbutrin) Duloxetine (cymbalta) Hydrocodone if needed for pain Omeprazole (prilosec) -  (take one the night before and one on the morning of surgery - helps to prevent nausea after surgery.) Pregabalin (lyrica) Vortioxetine (Trintellix)  Follow recommendations from Cardiologist, Pulmonologist or PCP regarding stopping Aspirin and Plavix (clopidogrel). According to the medical clearance; stop blood thinner medication 2 days before surgery. Last day to take is Saturday, September 24. Resume blood thinner medication 2 days AFTER surgery.  One week prior to surgery: starting  September 20 Stop Anti-inflammatories (NSAIDS) such as Advil, Aleve, Ibuprofen, Motrin, Naproxen, Naprosyn and Aspirin based products such as Excedrin, Goodys Powder, BC Powder. Stop ANY OVER THE COUNTER supplements until after surgery. (Calcium, vitamin D, multi-vitamin, omega 3, menopause relief) You may however, continue to take Tylenol if needed for pain up until the day of surgery.  No Alcohol for 24 hours before or after surgery.  No Smoking including e-cigarettes for 24 hours prior to surgery.  No chewable tobacco products for at least 6 hours prior to surgery.  No nicotine patches on the day of surgery.  Do not use any "recreational" drugs for at least a week prior to your surgery.  Please be advised that the combination of cocaine and anesthesia may have negative outcomes, up to and including death. If you test positive for cocaine, your surgery will be cancelled.  On the morning of surgery brush your teeth with toothpaste and water, you may rinse your mouth with mouthwash if you wish. Do not swallow any toothpaste or mouthwash.  Use CHG Soap as directed on instruction sheet.  Do not wear jewelry, make-up, hairpins, clips or nail polish.  Do not wear lotions, powders, or perfumes.   Do not shave body from the neck down 48 hours prior to surgery just in case you cut yourself which could leave a site for infection.  Also, freshly shaved skin may become irritated if using the CHG soap.  Contact lenses, hearing aids and dentures may not be worn into surgery.  Do not bring valuables to the hospital. [facility] is not responsible for any missing/lost belongings or valuables.  Notify your doctor if there is any change in your medical condition (cold, fever, infection).  Wear comfortable clothing (specific to your surgery type) to the hospital.  After surgery, you can help prevent lung complications by doing breathing exercises.  Take deep breaths and cough every 1-2 hours.  Your doctor may order a device called an Incentive Spirometer to help you take deep breaths.  If you are being admitted to the hospital overnight, leave your suitcase in the car. After surgery it may be brought to your room.  Please call the Pre-admissions Testing Dept. at [phone] if you have any questions about these instructions.  Surgery Visitation Policy:  Patients undergoing a surgery or procedure may have one family member or support person with them as long as that person is not COVID-19 positive or experiencing its symptoms.  That person may remain in the waiting area during the procedure.  Inpatient Visitation:    Visiting hours are 7 a.m. to 8 p.m. Inpatients will be allowed two visitors daily. The visitors may change each day during the patient's stay. No visitors under the age of 62. Any visitor under the age of 5 must be accompanied by an adult. The visitor must pass COVID-19 screenings, use hand sanitizer when entering and exiting the patient's room and wear a mask at all times, including in the patient's room. Patients must also wear a mask when staff or their visitor are in the room. Masking is required regardless of vaccination status.

## 2021-07-08 ENCOUNTER — Other Ambulatory Visit: Payer: Self-pay

## 2021-07-08 ENCOUNTER — Other Ambulatory Visit
Admission: RE | Admit: 2021-07-08 | Discharge: 2021-07-08 | Disposition: A | Discharge status: Home / Self Care | Payer: Medicaid Other | Source: Ambulatory Visit | Attending: Orthopedic Surgery | Admitting: Orthopedic Surgery

## 2021-07-08 DIAGNOSIS — Z20822 Contact with and (suspected) exposure to covid-19: Secondary | ICD-10-CM | POA: Diagnosis not present

## 2021-07-08 DIAGNOSIS — Z01812 Encounter for preprocedural laboratory examination: Secondary | ICD-10-CM | POA: Insufficient documentation

## 2021-07-08 LAB — SARS CORONAVIRUS 2 (TAT 6-24 HRS): SARS Coronavirus 2: NEGATIVE

## 2021-07-12 ENCOUNTER — Ambulatory Visit: Payer: Medicaid Other | Admitting: Urgent Care

## 2021-07-12 ENCOUNTER — Observation Stay: Payer: Medicaid Other

## 2021-07-12 ENCOUNTER — Encounter
Admission: RE | Disposition: A | Discharge status: Home / Self Care | Payer: Self-pay | Source: Ambulatory Visit | Attending: Orthopedic Surgery

## 2021-07-12 ENCOUNTER — Observation Stay
Admission: RE | Admit: 2021-07-12 | Discharge: 2021-07-13 | Disposition: A | Discharge status: Home / Self Care | Payer: Medicaid Other | Source: Ambulatory Visit | Attending: Orthopedic Surgery | Admitting: Orthopedic Surgery

## 2021-07-12 ENCOUNTER — Encounter: Payer: Self-pay | Admitting: Orthopedic Surgery

## 2021-07-12 ENCOUNTER — Other Ambulatory Visit: Payer: Self-pay

## 2021-07-12 DIAGNOSIS — F17200 Nicotine dependence, unspecified, uncomplicated: Secondary | ICD-10-CM | POA: Insufficient documentation

## 2021-07-12 DIAGNOSIS — Z79899 Other long term (current) drug therapy: Secondary | ICD-10-CM | POA: Diagnosis not present

## 2021-07-12 DIAGNOSIS — Z7982 Long term (current) use of aspirin: Secondary | ICD-10-CM | POA: Diagnosis not present

## 2021-07-12 DIAGNOSIS — I10 Essential (primary) hypertension: Secondary | ICD-10-CM | POA: Insufficient documentation

## 2021-07-12 DIAGNOSIS — R7303 Prediabetes: Secondary | ICD-10-CM | POA: Diagnosis not present

## 2021-07-12 DIAGNOSIS — Z96651 Presence of right artificial knee joint: Secondary | ICD-10-CM | POA: Insufficient documentation

## 2021-07-12 DIAGNOSIS — M1711 Unilateral primary osteoarthritis, right knee: Principal | ICD-10-CM | POA: Insufficient documentation

## 2021-07-12 DIAGNOSIS — G8918 Other acute postprocedural pain: Secondary | ICD-10-CM

## 2021-07-12 HISTORY — PX: TOTAL KNEE ARTHROPLASTY: SHX125

## 2021-07-12 SURGERY — ARTHROPLASTY, KNEE, TOTAL
Anesthesia: General | Site: Knee | Laterality: Right

## 2021-07-12 MED ORDER — OXYCODONE HCL 5 MG/5ML PO SOLN: 5.0000 mg | Freq: Once | ORAL | Status: AC | PRN

## 2021-07-12 MED ORDER — MIDAZOLAM HCL 2 MG/2ML IJ SOLN
INTRAMUSCULAR | Status: DC | PRN
  Administered 2021-07-12: 2 mg via INTRAVENOUS

## 2021-07-12 MED ORDER — LIDOCAINE HCL (PF) 2 % IJ SOLN
INTRAMUSCULAR | Status: AC
  Filled 2021-07-12: qty 5

## 2021-07-12 MED ORDER — FENTANYL CITRATE (PF) 100 MCG/2ML IJ SOLN
25.0000 ug | INTRAMUSCULAR | Status: DC | PRN
  Administered 2021-07-12: 25 ug via INTRAVENOUS
  Administered 2021-07-12 (×2): 50 ug via INTRAVENOUS
  Administered 2021-07-12: 25 ug via INTRAVENOUS

## 2021-07-12 MED ORDER — BACLOFEN 10 MG PO TABS
5.0000 mg | ORAL_TABLET | Freq: Every evening | ORAL | Status: DC | PRN
  Filled 2021-07-12: qty 0.5

## 2021-07-12 MED ORDER — MORPHINE SULFATE (PF) 10 MG/ML IV SOLN
INTRAVENOUS | Status: AC
  Filled 2021-07-12: qty 1

## 2021-07-12 MED ORDER — CHLORHEXIDINE GLUCONATE 0.12 % MT SOLN
OROMUCOSAL | Status: AC
  Administered 2021-07-12: 15 mL via OROMUCOSAL
  Filled 2021-07-12: qty 15

## 2021-07-12 MED ORDER — MIDAZOLAM HCL 2 MG/2ML IJ SOLN
INTRAMUSCULAR | Status: AC
  Filled 2021-07-12: qty 2

## 2021-07-12 MED ORDER — CALCIUM CARBONATE-VITAMIN D 500-200 MG-UNIT PO TABS
1.0000 | ORAL_TABLET | Freq: Every day | ORAL | Status: DC
  Administered 2021-07-13: 1 via ORAL
  Filled 2021-07-12: qty 1

## 2021-07-12 MED ORDER — ACETAMINOPHEN 325 MG PO TABS: 325.0000 mg | ORAL_TABLET | Freq: Four times a day (QID) | ORAL | Status: DC | PRN

## 2021-07-12 MED ORDER — ACETAMINOPHEN 10 MG/ML IV SOLN
INTRAVENOUS | Status: DC | PRN
  Administered 2021-07-12: 1000 mg via INTRAVENOUS

## 2021-07-12 MED ORDER — ASPIRIN EC 325 MG PO TBEC: 325.0000 mg | DELAYED_RELEASE_TABLET | Freq: Every day | ORAL | Status: DC

## 2021-07-12 MED ORDER — POLYETHYLENE GLYCOL 3350 17 G PO PACK: 17.0000 g | PACK | Freq: Every day | ORAL | Status: DC | PRN

## 2021-07-12 MED ORDER — DIPHENHYDRAMINE HCL 12.5 MG/5ML PO ELIX: 12.5000 mg | ORAL_SOLUTION | ORAL | Status: DC | PRN

## 2021-07-12 MED ORDER — DOCUSATE SODIUM 100 MG PO CAPS
100.0000 mg | ORAL_CAPSULE | Freq: Two times a day (BID) | ORAL | Status: DC
  Administered 2021-07-12 – 2021-07-13 (×2): 100 mg via ORAL
  Filled 2021-07-12 (×2): qty 1

## 2021-07-12 MED ORDER — OXYCODONE HCL 5 MG PO TABS
5.0000 mg | ORAL_TABLET | Freq: Once | ORAL | Status: AC | PRN
  Administered 2021-07-12: 5 mg via ORAL

## 2021-07-12 MED ORDER — MENTHOL 3 MG MT LOZG
1.0000 | LOZENGE | OROMUCOSAL | Status: DC | PRN
  Administered 2021-07-12: 3 mg via ORAL
  Filled 2021-07-12 (×2): qty 9

## 2021-07-12 MED ORDER — PREGABALIN 50 MG PO CAPS
200.0000 mg | ORAL_CAPSULE | Freq: Two times a day (BID) | ORAL | Status: DC
  Administered 2021-07-12 – 2021-07-13 (×2): 200 mg via ORAL
  Filled 2021-07-12 (×2): qty 4

## 2021-07-12 MED ORDER — ATORVASTATIN CALCIUM 20 MG PO TABS
40.0000 mg | ORAL_TABLET | Freq: Every day | ORAL | Status: DC
  Administered 2021-07-12: 40 mg via ORAL
  Filled 2021-07-12: qty 2

## 2021-07-12 MED ORDER — MAGNESIUM HYDROXIDE 400 MG/5ML PO SUSP
30.0000 mL | Freq: Every day | ORAL | Status: DC
  Administered 2021-07-13: 30 mL via ORAL
  Filled 2021-07-12: qty 30

## 2021-07-12 MED ORDER — TRAMADOL HCL 50 MG PO TABS
50.0000 mg | ORAL_TABLET | Freq: Four times a day (QID) | ORAL | Status: DC
  Administered 2021-07-12 – 2021-07-13 (×2): 50 mg via ORAL
  Filled 2021-07-12 (×2): qty 1

## 2021-07-12 MED ORDER — LISINOPRIL 5 MG PO TABS
5.0000 mg | ORAL_TABLET | Freq: Every day | ORAL | Status: DC
  Administered 2021-07-12: 5 mg via ORAL
  Filled 2021-07-12: qty 1

## 2021-07-12 MED ORDER — CEFAZOLIN SODIUM-DEXTROSE 2-4 GM/100ML-% IV SOLN
INTRAVENOUS | Status: AC
  Filled 2021-07-12: qty 100

## 2021-07-12 MED ORDER — METHOCARBAMOL 1000 MG/10ML IJ SOLN
500.0000 mg | Freq: Four times a day (QID) | INTRAVENOUS | Status: DC | PRN
  Filled 2021-07-12: qty 5

## 2021-07-12 MED ORDER — METOCLOPRAMIDE HCL 10 MG PO TABS: 5.0000 mg | ORAL_TABLET | Freq: Three times a day (TID) | ORAL | Status: DC | PRN

## 2021-07-12 MED ORDER — PROPOFOL 1000 MG/100ML IV EMUL
INTRAVENOUS | Status: AC
  Filled 2021-07-12: qty 100

## 2021-07-12 MED ORDER — HYDROMORPHONE HCL 1 MG/ML IJ SOLN
INTRAMUSCULAR | Status: AC
  Filled 2021-07-12: qty 1

## 2021-07-12 MED ORDER — PREGABALIN 75 MG PO CAPS
150.0000 mg | ORAL_CAPSULE | Freq: Two times a day (BID) | ORAL | Status: DC
  Administered 2021-07-12 – 2021-07-13 (×2): 150 mg via ORAL
  Filled 2021-07-12 (×2): qty 2

## 2021-07-12 MED ORDER — FENTANYL CITRATE (PF) 100 MCG/2ML IJ SOLN
INTRAMUSCULAR | Status: AC
  Filled 2021-07-12: qty 2

## 2021-07-12 MED ORDER — NEOMYCIN-POLYMYXIN B GU 40-200000 IR SOLN
Status: AC
  Filled 2021-07-12: qty 20

## 2021-07-12 MED ORDER — ZOLPIDEM TARTRATE 5 MG PO TABS
5.0000 mg | ORAL_TABLET | Freq: Every evening | ORAL | Status: DC | PRN
  Administered 2021-07-12: 5 mg via ORAL
  Filled 2021-07-12: qty 1

## 2021-07-12 MED ORDER — DEXAMETHASONE SODIUM PHOSPHATE 10 MG/ML IJ SOLN
INTRAMUSCULAR | Status: AC
  Filled 2021-07-12: qty 1

## 2021-07-12 MED ORDER — CEFAZOLIN SODIUM-DEXTROSE 2-4 GM/100ML-% IV SOLN
2.0000 g | Freq: Four times a day (QID) | INTRAVENOUS | Status: AC
Start: 2021-07-12 — End: 2021-07-12
  Administered 2021-07-12 (×2): 2 g via INTRAVENOUS
  Filled 2021-07-12 (×2): qty 100

## 2021-07-12 MED ORDER — ONDANSETRON HCL 4 MG/2ML IJ SOLN
INTRAMUSCULAR | Status: DC | PRN
  Administered 2021-07-12: 4 mg via INTRAVENOUS

## 2021-07-12 MED ORDER — VITAMIN D 25 MCG (1000 UNIT) PO TABS
5000.0000 [IU] | ORAL_TABLET | Freq: Every day | ORAL | Status: DC
  Administered 2021-07-13: 5000 [IU] via ORAL
  Filled 2021-07-12: qty 5

## 2021-07-12 MED ORDER — SODIUM CHLORIDE FLUSH 0.9 % IV SOLN
INTRAVENOUS | Status: AC
  Filled 2021-07-12: qty 40

## 2021-07-12 MED ORDER — SUGAMMADEX SODIUM 500 MG/5ML IV SOLN
INTRAVENOUS | Status: DC | PRN
Start: 2021-07-12 — End: 2021-07-12
  Administered 2021-07-12: 500 mg via INTRAVENOUS

## 2021-07-12 MED ORDER — ACETAMINOPHEN 10 MG/ML IV SOLN: 1000.0000 mg | Freq: Once | INTRAVENOUS | Status: DC | PRN

## 2021-07-12 MED ORDER — ALUM & MAG HYDROXIDE-SIMETH 200-200-20 MG/5ML PO SUSP: 30.0000 mL | ORAL | Status: DC | PRN

## 2021-07-12 MED ORDER — CLOPIDOGREL BISULFATE 75 MG PO TABS
75.0000 mg | ORAL_TABLET | Freq: Every day | ORAL | Status: DC
  Administered 2021-07-13: 75 mg via ORAL
  Filled 2021-07-12: qty 1

## 2021-07-12 MED ORDER — AMLODIPINE BESYLATE 5 MG PO TABS
5.0000 mg | ORAL_TABLET | Freq: Every day | ORAL | Status: DC
  Administered 2021-07-12: 5 mg via ORAL
  Filled 2021-07-12: qty 1

## 2021-07-12 MED ORDER — DEXMEDETOMIDINE (PRECEDEX) IN NS 20 MCG/5ML (4 MCG/ML) IV SYRINGE
PREFILLED_SYRINGE | INTRAVENOUS | Status: AC
  Filled 2021-07-12: qty 5

## 2021-07-12 MED ORDER — ONDANSETRON HCL 4 MG/2ML IJ SOLN
INTRAMUSCULAR | Status: AC
  Filled 2021-07-12: qty 2

## 2021-07-12 MED ORDER — ASPIRIN EC 325 MG PO TBEC
325.0000 mg | DELAYED_RELEASE_TABLET | Freq: Every day | ORAL | Status: DC
  Administered 2021-07-13: 325 mg via ORAL
  Filled 2021-07-12: qty 1

## 2021-07-12 MED ORDER — VIBEGRON 75 MG PO TABS: 1.0000 | ORAL_TABLET | Freq: Every day | ORAL | Status: DC

## 2021-07-12 MED ORDER — SURGIPHOR WOUND IRRIGATION SYSTEM - OPTIME
TOPICAL | Status: DC | PRN
  Administered 2021-07-12: 1 via TOPICAL

## 2021-07-12 MED ORDER — FLEET ENEMA 7-19 GM/118ML RE ENEM: 1.0000 | ENEMA | Freq: Once | RECTAL | Status: DC | PRN

## 2021-07-12 MED ORDER — KETOROLAC TROMETHAMINE 30 MG/ML IJ SOLN
INTRAMUSCULAR | Status: AC
  Filled 2021-07-12: qty 1

## 2021-07-12 MED ORDER — HYDROMORPHONE HCL 1 MG/ML IJ SOLN
0.5000 mg | INTRAMUSCULAR | Status: DC | PRN
  Administered 2021-07-12 (×2): 0.5 mg via INTRAVENOUS

## 2021-07-12 MED ORDER — CHLORHEXIDINE GLUCONATE 0.12 % MT SOLN: 15.0000 mL | Freq: Once | OROMUCOSAL | Status: AC

## 2021-07-12 MED ORDER — OMEGA-3-ACID ETHYL ESTERS 1 G PO CAPS
1000.0000 mg | ORAL_CAPSULE | Freq: Every day | ORAL | Status: DC
  Administered 2021-07-13: 1000 mg via ORAL
  Filled 2021-07-12: qty 1

## 2021-07-12 MED ORDER — ONDANSETRON HCL 4 MG PO TABS: 4.0000 mg | ORAL_TABLET | Freq: Four times a day (QID) | ORAL | Status: DC | PRN

## 2021-07-12 MED ORDER — BUPIVACAINE HCL (PF) 0.5 % IJ SOLN
INTRAMUSCULAR | Status: AC
  Filled 2021-07-12: qty 10

## 2021-07-12 MED ORDER — GLYCOPYRROLATE 0.2 MG/ML IJ SOLN
INTRAMUSCULAR | Status: AC
  Filled 2021-07-12: qty 1

## 2021-07-12 MED ORDER — ORAL CARE MOUTH RINSE: 15.0000 mL | Freq: Once | OROMUCOSAL | Status: AC

## 2021-07-12 MED ORDER — PANTOPRAZOLE SODIUM 40 MG PO TBEC
40.0000 mg | DELAYED_RELEASE_TABLET | Freq: Every day | ORAL | Status: DC
  Administered 2021-07-13: 40 mg via ORAL
  Filled 2021-07-12: qty 1

## 2021-07-12 MED ORDER — PHENYLEPHRINE HCL (PRESSORS) 10 MG/ML IV SOLN
INTRAVENOUS | Status: DC | PRN
  Administered 2021-07-12: 100 ug via INTRAVENOUS

## 2021-07-12 MED ORDER — SODIUM CHLORIDE 0.9 % IR SOLN
Status: DC | PRN
  Administered 2021-07-12: 300 mL

## 2021-07-12 MED ORDER — METHOCARBAMOL 500 MG PO TABS: 500.0000 mg | ORAL_TABLET | Freq: Four times a day (QID) | ORAL | Status: DC | PRN

## 2021-07-12 MED ORDER — OXYCODONE HCL 5 MG PO TABS
ORAL_TABLET | ORAL | Status: AC
  Filled 2021-07-12: qty 1

## 2021-07-12 MED ORDER — SODIUM CHLORIDE (PF) 0.9 % IJ SOLN
INTRAMUSCULAR | Status: DC | PRN
  Administered 2021-07-12: 92 mL

## 2021-07-12 MED ORDER — PROPOFOL 10 MG/ML IV BOLUS
INTRAVENOUS | Status: DC | PRN
  Administered 2021-07-12: 120 mg via INTRAVENOUS

## 2021-07-12 MED ORDER — LACTATED RINGERS IV SOLN: INTRAVENOUS | Status: DC

## 2021-07-12 MED ORDER — EPHEDRINE SULFATE 50 MG/ML IJ SOLN
INTRAMUSCULAR | Status: DC | PRN
  Administered 2021-07-12: 10 mg via INTRAVENOUS
  Administered 2021-07-12: 5 mg via INTRAVENOUS

## 2021-07-12 MED ORDER — HYDROMORPHONE HCL 1 MG/ML IJ SOLN: 0.5000 mg | INTRAMUSCULAR | Status: DC | PRN

## 2021-07-12 MED ORDER — OXYCODONE HCL 5 MG PO TABS
10.0000 mg | ORAL_TABLET | ORAL | Status: DC | PRN
  Administered 2021-07-13: 10 mg via ORAL
  Administered 2021-07-13: 15 mg via ORAL
  Filled 2021-07-12: qty 2

## 2021-07-12 MED ORDER — SODIUM CHLORIDE 0.9 % IR SOLN
Status: DC | PRN
  Administered 2021-07-12: 1500 mL

## 2021-07-12 MED ORDER — DEXAMETHASONE SODIUM PHOSPHATE 10 MG/ML IJ SOLN
INTRAMUSCULAR | Status: DC | PRN
  Administered 2021-07-12: 10 mg via INTRAVENOUS

## 2021-07-12 MED ORDER — BUPROPION HCL ER (XL) 150 MG PO TB24
150.0000 mg | ORAL_TABLET | Freq: Every day | ORAL | Status: DC
  Administered 2021-07-13: 150 mg via ORAL
  Filled 2021-07-12: qty 1

## 2021-07-12 MED ORDER — LIDOCAINE HCL (CARDIAC) PF 100 MG/5ML IV SOSY
PREFILLED_SYRINGE | INTRAVENOUS | Status: DC | PRN
  Administered 2021-07-12: 100 mg via INTRAVENOUS

## 2021-07-12 MED ORDER — GLYCOPYRROLATE 0.2 MG/ML IJ SOLN
INTRAMUSCULAR | Status: DC | PRN
  Administered 2021-07-12: .2 mg via INTRAVENOUS

## 2021-07-12 MED ORDER — DEXMEDETOMIDINE (PRECEDEX) IN NS 20 MCG/5ML (4 MCG/ML) IV SYRINGE
PREFILLED_SYRINGE | INTRAVENOUS | Status: DC | PRN
  Administered 2021-07-12 (×2): 8 ug via INTRAVENOUS

## 2021-07-12 MED ORDER — FENTANYL CITRATE (PF) 100 MCG/2ML IJ SOLN
INTRAMUSCULAR | Status: DC | PRN
  Administered 2021-07-12 (×2): 50 ug via INTRAVENOUS

## 2021-07-12 MED ORDER — PHENOL 1.4 % MT LIQD
1.0000 | OROMUCOSAL | Status: DC | PRN
  Filled 2021-07-12 (×2): qty 177

## 2021-07-12 MED ORDER — DULOXETINE HCL 60 MG PO CPEP
60.0000 mg | ORAL_CAPSULE | Freq: Two times a day (BID) | ORAL | Status: DC
  Administered 2021-07-12 – 2021-07-13 (×2): 60 mg via ORAL
  Filled 2021-07-12 (×3): qty 1

## 2021-07-12 MED ORDER — ONDANSETRON HCL 4 MG/2ML IJ SOLN: 4.0000 mg | Freq: Once | INTRAMUSCULAR | Status: DC | PRN

## 2021-07-12 MED ORDER — SODIUM CHLORIDE 0.9 % IV SOLN: INTRAVENOUS | Status: DC

## 2021-07-12 MED ORDER — EPHEDRINE 5 MG/ML INJ
INTRAVENOUS | Status: AC
  Filled 2021-07-12: qty 5

## 2021-07-12 MED ORDER — ACETAMINOPHEN 10 MG/ML IV SOLN
INTRAVENOUS | Status: AC
  Filled 2021-07-12: qty 100

## 2021-07-12 MED ORDER — KETAMINE HCL 50 MG/5ML IJ SOSY
PREFILLED_SYRINGE | INTRAMUSCULAR | Status: AC
  Filled 2021-07-12: qty 5

## 2021-07-12 MED ORDER — VORTIOXETINE HBR 5 MG PO TABS
5.0000 mg | ORAL_TABLET | Freq: Every day | ORAL | Status: DC
  Filled 2021-07-12 (×2): qty 1

## 2021-07-12 MED ORDER — BUPIVACAINE LIPOSOME 1.3 % IJ SUSP
INTRAMUSCULAR | Status: AC
  Filled 2021-07-12: qty 20

## 2021-07-12 MED ORDER — KETAMINE HCL 10 MG/ML IJ SOLN
INTRAMUSCULAR | Status: DC | PRN
  Administered 2021-07-12: 10 mg via INTRAVENOUS
  Administered 2021-07-12: 20 mg via INTRAVENOUS

## 2021-07-12 MED ORDER — ROCURONIUM BROMIDE 100 MG/10ML IV SOLN
INTRAVENOUS | Status: DC | PRN
  Administered 2021-07-12: 50 mg via INTRAVENOUS
  Administered 2021-07-12 (×2): 20 mg via INTRAVENOUS

## 2021-07-12 MED ORDER — BUPIVACAINE-EPINEPHRINE (PF) 0.25% -1:200000 IJ SOLN
INTRAMUSCULAR | Status: AC
  Filled 2021-07-12: qty 30

## 2021-07-12 MED ORDER — CEFAZOLIN SODIUM-DEXTROSE 2-4 GM/100ML-% IV SOLN
2.0000 g | INTRAVENOUS | Status: AC
  Administered 2021-07-12: 2 g via INTRAVENOUS

## 2021-07-12 MED ORDER — OXYCODONE HCL 5 MG PO TABS
5.0000 mg | ORAL_TABLET | ORAL | Status: DC | PRN
  Administered 2021-07-12: 10 mg via ORAL
  Administered 2021-07-12: 5 mg via ORAL
  Filled 2021-07-12: qty 2
  Filled 2021-07-12: qty 1

## 2021-07-12 MED ORDER — METOCLOPRAMIDE HCL 5 MG/ML IJ SOLN
5.0000 mg | Freq: Three times a day (TID) | INTRAMUSCULAR | Status: DC | PRN
Start: 2021-07-12 — End: 2021-07-13

## 2021-07-12 MED ORDER — PHENYLEPHRINE HCL (PRESSORS) 10 MG/ML IV SOLN
INTRAVENOUS | Status: AC
  Filled 2021-07-12: qty 1

## 2021-07-12 MED ORDER — ONDANSETRON HCL 4 MG/2ML IJ SOLN: 4.0000 mg | Freq: Four times a day (QID) | INTRAMUSCULAR | Status: DC | PRN

## 2021-07-12 SURGICAL SUPPLY — 65 items
ADAPTER OFFSET 5 (Connector) ×2 IMPLANT
BLADE SAGITTAL 25.0X1.19X90 (BLADE) ×2 IMPLANT
BLADE SAW 90X13X1.19 OSCILLAT (BLADE) ×2 IMPLANT
BLOCK CUTTING FEMUR 3+ RT MED (MISCELLANEOUS) ×2 IMPLANT
BLOCK CUTTING TIBIAL 3 RT (MISCELLANEOUS) ×2 IMPLANT
BLOCK CUTTING TIBIAL 4 RT MIS (MISCELLANEOUS) ×2 IMPLANT
BNDG ELASTIC 6X5.8 VLCR STR LF (GAUZE/BANDAGES/DRESSINGS) ×2 IMPLANT
CANISTER WOUND CARE 500ML ATS (WOUND CARE) ×2 IMPLANT
CEMENT HV SMART SET (Cement) ×4 IMPLANT
CHLORAPREP W/TINT 26 (MISCELLANEOUS) ×4 IMPLANT
COOLER POLAR GLACIER W/PUMP (MISCELLANEOUS) ×2 IMPLANT
DRAPE 3/4 80X56 (DRAPES) ×4 IMPLANT
DRSG MEPILEX SACRM 8.7X9.8 (GAUZE/BANDAGES/DRESSINGS) ×2 IMPLANT
ELECT CAUTERY BLADE 6.4 (BLADE) ×2 IMPLANT
ELECT REM PT RETURN 9FT ADLT (ELECTROSURGICAL) ×2
ELECTRODE REM PT RTRN 9FT ADLT (ELECTROSURGICAL) ×1 IMPLANT
FEMORAL COMP SZ3P RT SPHERE (Femur) ×2 IMPLANT
FEMUR BONE MODEL (MISCELLANEOUS) ×2 IMPLANT
GAUZE 4X4 16PLY ~~LOC~~+RFID DBL (SPONGE) ×2 IMPLANT
GAUZE SPONGE 4X4 12PLY STRL (GAUZE/BANDAGES/DRESSINGS) ×2 IMPLANT
GAUZE XEROFORM 1X8 LF (GAUZE/BANDAGES/DRESSINGS) ×2 IMPLANT
GLOVE SURG ORTHO LTX SZ8 (GLOVE) ×2 IMPLANT
GLOVE SURG SYN 9.0  PF PI (GLOVE) ×1
GLOVE SURG SYN 9.0 PF PI (GLOVE) ×1 IMPLANT
GLOVE SURG UNDER LTX SZ8 (GLOVE) ×2 IMPLANT
GLOVE SURG UNDER POLY LF SZ9 (GLOVE) ×2 IMPLANT
GOWN SRG 2XL LVL 4 RGLN SLV (GOWNS) ×1 IMPLANT
GOWN STRL NON-REIN 2XL LVL4 (GOWNS) ×1
GOWN STRL REUS W/ TWL LRG LVL3 (GOWN DISPOSABLE) ×1 IMPLANT
GOWN STRL REUS W/ TWL XL LVL3 (GOWN DISPOSABLE) ×1 IMPLANT
GOWN STRL REUS W/TWL LRG LVL3 (GOWN DISPOSABLE) ×1
GOWN STRL REUS W/TWL XL LVL3 (GOWN DISPOSABLE) ×1
INSERT TIBIAL FIXED SZ2 R 13MM (Insert) ×2 IMPLANT
IRRIGATION SURGIPHOR STRL (IV SOLUTION) ×2 IMPLANT
IV NS IRRIG 3000ML ARTHROMATIC (IV SOLUTION) ×2 IMPLANT
KIT PREVENA INCISION MGT20CM45 (CANNISTER) ×2 IMPLANT
KIT TURNOVER KIT A (KITS) ×2 IMPLANT
MANIFOLD NEPTUNE II (INSTRUMENTS) ×2 IMPLANT
NDL SAFETY ECLIPSE 18X1.5 (NEEDLE) ×1 IMPLANT
NEEDLE HYPO 18GX1.5 SHARP (NEEDLE) ×1
NEEDLE SPNL 18GX3.5 QUINCKE PK (NEEDLE) ×2 IMPLANT
NEEDLE SPNL 20GX3.5 QUINCKE YW (NEEDLE) ×2 IMPLANT
NS IRRIG 1000ML POUR BTL (IV SOLUTION) ×2 IMPLANT
PACK TOTAL KNEE (MISCELLANEOUS) ×2 IMPLANT
PAD WRAPON POLAR KNEE (MISCELLANEOUS) ×1 IMPLANT
PATELLA RESURFACING MEDACTA 02 (Bone Implant) ×2 IMPLANT
PENCIL SMOKE EVACUATOR COATED (MISCELLANEOUS) ×2 IMPLANT
PULSAVAC PLUS IRRIG FAN TIP (DISPOSABLE) ×2
SCALPEL PROTECTED #10 DISP (BLADE) ×4 IMPLANT
SPONGE T-LAP 18X18 ~~LOC~~+RFID (SPONGE) ×6 IMPLANT
STAPLER SKIN PROX 35W (STAPLE) ×2 IMPLANT
STEM EXTENSION 11MMX30MM (Stem) ×2 IMPLANT
SUCTION FRAZIER HANDLE 10FR (MISCELLANEOUS) ×1
SUCTION TUBE FRAZIER 10FR DISP (MISCELLANEOUS) ×1 IMPLANT
SUT DVC 2 QUILL PDO  T11 36X36 (SUTURE) ×1
SUT DVC 2 QUILL PDO T11 36X36 (SUTURE) ×1 IMPLANT
SUT V-LOC 90 ABS DVC 3-0 CL (SUTURE) ×2 IMPLANT
SYR 20ML LL LF (SYRINGE) ×2 IMPLANT
SYR 50ML LL SCALE MARK (SYRINGE) ×4 IMPLANT
TIB TRAY SZ 2 R FIXED (Joint) ×2 IMPLANT
TIP FAN IRRIG PULSAVAC PLUS (DISPOSABLE) ×1 IMPLANT
TOWEL OR 17X26 4PK STRL BLUE (TOWEL DISPOSABLE) ×2 IMPLANT
TOWER CARTRIDGE SMART MIX (DISPOSABLE) ×2 IMPLANT
WATER STERILE IRR 500ML POUR (IV SOLUTION) ×2 IMPLANT
WRAPON POLAR PAD KNEE (MISCELLANEOUS) ×2

## 2021-07-12 NOTE — Anesthesia Postprocedure Evaluation (Signed)
Anesthesia Post Note  Patient: Maximina Pirozzi  Procedure(s) Performed: TOTAL KNEE ARTHROPLASTY (Right: Knee)  Patient location during evaluation: PACU Anesthesia Type: General Level of consciousness: awake and alert Pain management: pain level controlled Vital Signs Assessment: post-procedure vital signs reviewed and stable Respiratory status: spontaneous breathing, nonlabored ventilation, respiratory function stable and patient connected to nasal cannula oxygen Cardiovascular status: blood pressure returned to baseline and stable Postop Assessment: no apparent nausea or vomiting Anesthetic complications: no   No notable events documented.   Last Vitals:  Vitals:   07/12/21 1038 07/12/21 1045  BP:  124/81  Pulse: 81 91  Resp: 11 18  Temp:    SpO2: 97% 97%    Last Pain:  Vitals:   07/12/21 1045  TempSrc:   PainSc: Asleep                 Corinda Gubler

## 2021-07-12 NOTE — Op Note (Addendum)
07/12/2021  9:39 AM  PATIENT:  Terri Wood   MRN: [id number]  PRE-OPERATIVE DIAGNOSIS:  Primary localized osteoarthritis of right knee   POST-OPERATIVE DIAGNOSIS:  Same   PROCEDURE:  Procedure(s): RIGHT TOTAL KNEE ARTHROPLASTY   SURGEON: Leitha Schuller, MD   ASSISTANTS: Cranston Neighbor, PA-C   ANESTHESIA:   general   EBL:  100   BLOOD ADMINISTERED:none   DRAINS:  Incisional wound VAC     LOCAL MEDICATIONS USED:  MARCAINE    and OTHER Exparel morphine and Toradol   SPECIMEN:  No Specimen   DISPOSITION OF SPECIMEN:  N/A   COUNTS:  YES   TOURNIQUET:  80 minutes at 300 mm Hg   IMPLANTS: Medacta  GMK sphere system with 3+ right femur, 2 tibia with short stem and 13 mm insert.  Size 2 patella, all components cemented.  5 mm offset for the tibial baseplate   DICTATION: .Dragon Dictation   patient was brought to the operating room and general anesthesia was obtained.  After prepping and draping the right leg in sterile fashion, and after patient identification and timeout procedures were completed, tourniquet was raised  and midline skin incision was made followed by medial parapatellar arthrotomy with severe medial compartment osteoarthritis, severe patellofemoral arthritis and moderate lateral compartment arthritis, partial synovectomy was also carried out.   The ACL and PCL and fat pad were excised along with anterior horns of the meniscus. The proximal tibia cutting guide from  the [facility] system was applied and the proximal tibia cut carried out.  The distal femoral cut was carried out in a similar fashion     The 3+ femoral cutting guide applied with anterior posterior and chamfer cuts made.  The posterior horns of the menisci were removed at this point.   Injection of the above medication was carried out after the femoral and tibial cuts were carried out.  The 2 baseplate trial was placed offset determined as 5 mm pinned into position and proximal tibial preparation carried out with  drilling hand reaming and the keel punch followed by placement of the 3+ femur and sizing the tibial insert size  13 millimeter gave the best fit with stability and full extension.  The distal femoral drill holes were made in the notch cut for the trochlear groove was then carried out with trials were then removed the patella was cut using the patellar cutting guide and it sized to a size 2after drill holes have been made  The knee was irrigated with pulsatile lavage and the bony surfaces dried the tibial component was cemented into place first.  Excess cement was removed and the polyethylene insert placed with a torque screw placed with a torque screwdriver tightened.  The distal femoral component was placed and the knee was held in extension as the patellar button was clamped into place.  After the cement was set, excess cement was removed and the knee was again irrigated thoroughly thoroughly irrigated.  The tourniquet was let down and hemostasis checked with electrocautery. The arthrotomy was repaired with a heavy Quill suture,  followed by 3-0 V lock subcuticular closure, skin staples followed by incisional wound VAC and Polar Care.[name] Kitchen   PLAN OF CARE: Admit for overnight observation   PATIENT DISPOSITION:  PACU - hemodynamically stable.

## 2021-07-12 NOTE — Evaluation (Signed)
Occupational Therapy Evaluation Patient Details Name: Terri Wood MRN: [id number] DOB: 02-27-1966 Today's Date: 07/12/2021   History of Present Illness Patient is a 55 year old female with primary osteoarthritis of right knee. Right TKA performed 07/12/21. Past medical history significant for HTN, former smoker, CVA with residual left hand weakness, morbid obesity, arthritis, fibromyalgia, narcotic dependence, osteoarthritis of left hip, anxiety, depression   Clinical Impression   Pt seen for OT evaluation this date, POD#0 from above surgery. Pt was modified independent with ADL prior to surgery, however occasionally using SPC due to R knee pain. Pt is eager to return to PLOF with less pain and improved safety and independence. Pt lives with her mother who is able to provide care after discharge as needed. Mother present for portion of OT evaluation. Pt currently requires minimal assist for LB ADL and CGA for ADL transfers due to pain and limited AROM and strength of R knee. Pt/mother instructed in falls prevention, pet care considerations, home/routines modifications, AE/DME for ADL, compression stocking mgt, and polar care mgt. Handout provided. Pt would benefit from skilled OT services including additional instruction in dressing techniques with or without assistive devices for dressing and bathing skills to support recall and carryover prior to discharge and ultimately to maximize safety, independence, and minimize falls risk and caregiver burden. Recommend HHOT services upon discharge to maximize recovery.   Recommendations for follow up therapy are one component of a multi-disciplinary discharge planning process, led by the attending physician.  Recommendations may be updated based on patient status, additional functional criteria and insurance authorization.   Follow Up Recommendations  Home health OT    Equipment Recommendations  None recommended by OT    Recommendations for Other Services        Precautions / Restrictions Precautions Precautions: Knee Precaution Booklet Issued: Yes (comment) Restrictions Weight Bearing Restrictions: Yes RLE Weight Bearing: Weight bearing as tolerated      Mobility Bed Mobility Overal bed mobility: Needs Assistance Bed Mobility: Sit to Supine      Sit to supine: Min assist   General bed mobility comments: Min A for RLE mgt    Transfers Overall transfer level: Needs assistance Equipment used: None Transfers: Lateral/Scoot Transfers         Lateral/Scoot Transfers: Min guard General transfer comment: VC for sequencing to improve technique    Balance Overall balance assessment: Needs assistance Sitting-balance support: Feet supported;No upper extremity supported Sitting balance-Leahy Scale: Good                                ADL either performed or assessed with clinical judgement   ADL Overall ADL's : Needs assistance/impaired                                       General ADL Comments: Pt currently requires MIN A for LB ADL, CGA for ADL transfers, and set up/Supv for seated UB ADL tasks. Pt's mother lives with pt and intends to provide assist as needed upon return home.     Vision Baseline Vision/History: 1 Wears glasses Ability to See in Adequate Light: 0 Adequate Patient Visual Report: No change from baseline       Perception     Praxis      Pertinent Vitals/Pain Pain Assessment: 0-10 Pain Score: 5  Pain Location: right knee Pain  Descriptors / Indicators: Sore;Grimacing;Guarding Pain Intervention(s): Limited activity within patient's tolerance;Monitored during session;RN gave pain meds during session;Ice applied;Repositioned     Hand Dominance Right   Extremity/Trunk Assessment Upper Extremity Assessment Upper Extremity Assessment: Overall WFL for tasks assessed;LUE deficits/detail LUE Deficits / Details: baseline left hand coordination issues after prior stroke.  overall grossly WFL for functional activity   Lower Extremity Assessment Lower Extremity Assessment: RLE deficits/detail;LLE deficits/detail RLE Deficits / Details: patient able to completed partial SLR without assistance. AAROM for full SLR, suspected more limited by pain than true weakness. no knee buckling with standing and weight bearing activity RLE Sensation: WNL LLE Deficits / Details: WFL observed with functional activity       Communication Communication Communication: No difficulties   Cognition Arousal/Alertness: Suspect due to medications Behavior During Therapy: WFL for tasks assessed/performed Overall Cognitive Status: Within Functional Limits for tasks assessed                                 General Comments: a bit out of it, suspect due to meds, but able to follow simple commands with increased time   General Comments       Exercises Other Exercises: Pt/mother instructed in falls prevention, pet care considerations, home/routines modifications, AE/DME for ADL, compression stocking mgt, and polar care mgt. Handout provided to support recall and carryover. Would benefit from additional instruction.   Shoulder Instructions      Home Living Family/patient expects to be discharged to:: Private residence Living Arrangements: Parent Available Help at Discharge: Family;Available 24 hours/day Type of Home: House Home Access: Stairs to enter [employer] of Steps: 4 Entrance Stairs-Rails: Left (on the left side while entering the home) Home Layout: One level     Bathroom Shower/Tub: Chief Strategy Officer: Standard     Home Equipment: Cane - single point;Walker - 2 wheels;Walker - 4 wheels;Grab bars - tub/shower;Shower seat;Bedside commode;Adaptive equipment Adaptive Equipment: Reacher        Prior Functioning/Environment Level of Independence: Independent with assistive device(s)        Comments: Mod I with use of cane  and shower chair as needed.        OT Problem List: Decreased strength;Decreased coordination;Pain;Decreased range of motion;Decreased activity tolerance;Decreased knowledge of use of DME or AE;Impaired balance (sitting and/or standing);Decreased knowledge of precautions      OT Treatment/Interventions: Self-care/ADL training;Therapeutic exercise;Therapeutic activities;DME and/or AE instruction;Patient/family education;Balance training    OT Goals(Current goals can be found in the care plan section) Acute Rehab OT Goals Patient Stated Goal: to go home OT Goal Formulation: With patient/family Time For Goal Achievement: 07/26/21 Potential to Achieve Goals: Good ADL Goals Pt Will Perform Lower Body Dressing: with modified independence;with adaptive equipment;sit to/from stand Pt Will Transfer to Toilet: with supervision;ambulating (BSC over toilet, LRAD) Additional ADL Goal #1: Pt will independently instruct family in polar care mgt Additional ADL Goal #2: Pt will independently instruct family in compression stocking mgt  OT Frequency: Min 2X/week   Barriers to D/C:            Co-evaluation              AM-PAC OT "6 Clicks" Daily Activity     Outcome Measure Help from another person eating meals?: None Help from another person taking care of personal grooming?: A Little Help from another person toileting, which includes using toliet, bedpan, or urinal?: A Little Help  from another person bathing (including washing, rinsing, drying)?: A Little Help from another person to put on and taking off regular upper body clothing?: None Help from another person to put on and taking off regular lower body clothing?: A Little 6 Click Score: 20   End of Session Nurse Communication: Other (comment) (SpO2 on room air 88-91%, per RN, place back on 1L O2)  Activity Tolerance: Patient tolerated treatment well Patient left: in bed;with call bell/phone within reach;with bed alarm set;with SCD's  reapplied;Other (comment) (wound vac, polar care in place)  OT Visit Diagnosis: Other abnormalities of gait and mobility (R26.89);Pain Pain - Right/Left: Right Pain - part of body: Knee                Time: 1511-1536 OT Time Calculation (min): 25 min Charges:  OT General Charges $OT Visit: 1 Visit OT Evaluation $OT Eval Moderate Complexity: 1 Mod OT Treatments $Self Care/Home Management : 8-22 mins  Arman Filter., MPH, MS, OTR/L ascom [phone] 07/12/21, 4:10 PM

## 2021-07-12 NOTE — H&P (Signed)
Chief Complaint  Patient presents with   Pre-op Exam  Rigth TKA scheduled 07/12/21 by Dr. Rosita Kea    History of the Present Illness: Terri Wood is a 55 y.o. female here today for history and physical for right total knee arthroplasty with Dr. Kennedy Bucker on 07/12/2021. She has had years of progressive right knee pain that is interfering with her quality of life and activities daily living. Unable to walk 100 feet without an assistive device. If she goes to the grocery store and walks, she will be in severe knee pain following her outing that would limit her from performing any other activities the remainder of the day. She has x-rays showing complete loss of joint space in the right knee with significant bone erosion with severe patellofemoral degenerative changes.  The patient states her stroke was in 07/2018.   I have reviewed past medical, surgical, social and family history, and allergies as documented in the EMR.  Past Medical History: Past Medical History:  Diagnosis Date   Acute CVA (cerebrovascular accident) (CMS-HCC) 08/15/2019   Acute hip pain, left 02/28/2018   Acute respiratory failure (CMS-HCC) 01/16/2021   Anxiety   Avascular necrosis of left femoral head (CMS-HCC) 04/09/2018   Body mass index 40.0-44.9, adult (CMS-HCC) 02/11/2016   Carotid artery stenosis 08/05/2019  Note: right - near complete   Cerebrovascular accident (CVA) due to occlusion of right middle cerebral artery (CMS-HCC) 05/03/2020   Cerebrovascular disease 08/05/2019  Note: right MCA blockage   Chronic midline low back pain with right-sided sciatica 02/11/2016   Community acquired pneumonia 01/16/2021   CVA, old, hemiparesis (CMS-HCC) 01/16/2021   Depression   Depression, major, single episode, complete remission (CMS-HCC) 11/05/2015   Depressive disorder 02/02/2015   Essential hypertension 02/11/2016   Fibromyalgia   Foraminal stenosis of lumbar region 02/27/2018   Generalized osteoarthrosis, involving multiple  sites 08/07/2015   Hyperkalemia 09/13/2017   Hyperlipidemia 04/27/2021   Hypertension   Joint pain   Lumbar radiculopathy 12/15/2016   Major depressive disorder, recurrent episode, moderate (CMS-HCC) 11/08/2018   Morbid obesity (CMS-HCC) 01/16/2021   Myalgia and myositis 11/08/2018   Numbness and tingling in both hands 02/11/2016   Osteoarthritis   Pain, joint, multiple sites 02/28/2018   Perimenopausal symptoms 12/15/2016   Prediabetes 04/07/2019  Note: Unchanged   Primary osteoarthritis of right knee 08/07/2017   Restless leg syndrome   Restless legs syndrome 02/02/2015   Sciatica 06/25/2015   Past Surgical History: Past Surgical History:  Procedure Laterality Date   GASTRIC BYPASS OPEN 1996   R. Plantar Fascia surgery Right   TARSAL TUNNEL RELEASE 2009   total hip arhtroplasty anterior approach Left 04/09/2018  Dr.   Past Family History: Family History  Problem Relation Age of Onset   Osteoarthritis Mother   High blood pressure (Hypertension) Mother   Arthritis Father   Colon cancer Paternal Grandfather   Medications: Current Outpatient Medications Ordered in Epic  Medication Sig Dispense Refill   acetaminophen (TYLENOL) 500 MG tablet Take 1 tablet by mouth 3 (three) times a day   amLODIPine (NORVASC) 10 MG tablet Take 1 tablet (10 mg total) by mouth once daily 90 tablet 1   apple cider vinegar 500 mg Tab Take 1 tablet by mouth once daily (Patient not taking: Reported on 06/05/2021)   aspirin 325 MG EC tablet Take 1 tablet by mouth once daily   atorvastatin (LIPITOR) 40 MG tablet Take 1 tablet by mouth once daily   buPROPion (WELLBUTRIN XL) 150 MG  XL tablet Take 150 mg by mouth every morning   cholecalciferol, vitamin D3, 10 mcg (400 unit) Cap Take 1 capsule by mouth once daily   clopidogreL (PLAVIX) 75 mg tablet Take 75 mg by mouth once daily   cyanocobalamin, vitamin B-12, 1,000 mcg/mL Drop Take 1,000 mcg by mouth once daily   doxycycline (VIBRAMYCIN) 100 MG capsule Take 1  capsule by mouth once daily (Patient not taking: Reported on 06/05/2021)   DULoxetine (CYMBALTA) 60 MG DR capsule Take 1 capsule (60 mg total) by mouth once daily 30 capsule 5   escitalopram oxalate (LEXAPRO) 10 MG tablet Take 1 tablet by mouth once daily   EUA PAXLOVID 300 mg (150 mg x 2)-100 mg tablet as directed   GEMTESA 75 mg Tab once daily   lisinopril-hydrochlorothiazide (PRINZIDE,ZESTORETIC) 20-25 mg tablet Take 1 tablet by mouth once daily 90 tablet 1   MULTIVITS,CA,MINERALS/IRON/FA (ONE-A-DAY WOMENS FORMULA ORAL) Take 1 tablet by mouth once daily   omeprazole (PRILOSEC) 40 MG DR capsule Take 1 capsule by mouth once daily   OZEMPIC 0.25 mg or 0.5 mg(2 mg/1.5 mL) pen injector as directed   PARoxetine (PAXIL) 20 MG tablet Take 1 tablet (20 mg total) by mouth once daily 30 tablet 5   pregabalin (LYRICA) 150 MG capsule Take 1 capsule by mouth 2 (two) times daily   tiZANidine (ZANAFLEX) 2 MG tablet Take 1 tablet (2 mg total) by mouth nightly as needed 30 tablet 1   VASCEPA 1 gram capsule Take 2 g by mouth 2 (two) times daily   No current Epic-ordered facility-administered medications on file.   Allergies: Allergies  Allergen Reactions   Penicillins Other (See Comments), Rash and Hives  Did it involve swelling of the face/tongue/throat, SOB, or low BP? Yes Did it involve sudden or severe rash/hives, skin peeling, or any reaction on the inside of your mouth or nose? No Did you need to seek medical attention at a hospital or doctor's office? No When did it last happen? Within the past 10 years  If all above answers are "NO", may proceed with cephalosporin use. Has patient had a PCN reaction causing immediate rash, facial/tongue/throat swelling, SOB or lightheadedness with hypotension: Yes Has patient had a PCN reaction causing severe rash involving mucus membranes or skin necrosis: No Has patient had a PCN reaction that required hospitalization: No Has patient had a PCN reaction occurring  within the last 10 years: Yes If all of the above answers are "NO", then may proceed with Cephalosporin use.   Penicillin Hives    Body mass index is 46.27 kg/m.  Review of Systems: A comprehensive 14 point ROS was performed, reviewed, and the pertinent orthopaedic findings are documented in the HPI.  Vitals:  07/01/21 1004  BP: 120/64    General Physical Examination:  General:  Well developed, well nourished, no apparent distress, normal affect, antalgic gait with no assistive devices.  HEENT: Head normocephalic, atraumatic, PERRL.   Abdomen: Soft, non tender, non distended, Bowel sounds present.  Heart: Examination of the heart reveals regular, rate, and rhythm. There is no murmur noted on ascultation. There is a normal apical pulse.  Lungs: Lungs are clear to auscultation. There is no wheeze, rhonchi, or crackles. There is normal expansion of bilateral chest walls.   Musculoskeletal Examination:  On exam, right knee range of motion of 5 to 85 degrees. No effusion. Slight varus deformity. No laxity valgus varus stress testing. She is able to maintain active extension. No warmth, erythema  or edema throughout the lower extremity. Ankle plantarflexion dorsiflexion is intact 2+ radial pulse.  Radiographs: X-rays of the right knee reviewed by me today from 04/25/2021 show complete loss of joint space in the medial compartment with varus deformity with tricompartmental arthritic changes most severe in the medial and patellofemoral compartments.  Assessment: ICD-10-CM  1. Primary osteoarthritis of right knee M17.11  2. Body mass index 40.0-44.9, adult (CMS-HCC) Z68.41   Plan: 52. 55 year old female with greater than 1 year of right knee pain with right knee x-ray showing severe osteoarthritis of the right knee. Pain is interfered with quality of life and activities daily living. Risks, benefits, complications of a right total knee arthroplasty have been discussed with the  patient. Patient has agreed and consented procedure with Dr. Kennedy Bucker on 07/12/2021  Electronically signed by Patience Musca, PA at 07/01/2021 10:36 AM EDT  Reviewed  H+P. No changes noted.

## 2021-07-12 NOTE — Anesthesia Preprocedure Evaluation (Addendum)
Anesthesia Evaluation  Patient identified by MRN, date of birth, ID band Patient awake    Reviewed: Allergy & Precautions, NPO status , Patient's Chart, lab work & pertinent test results  History of Anesthesia Complications Negative for: history of anesthetic complications  Airway Mallampati: II  TM Distance: >3 FB Neck ROM: Full    Dental  (+) Partial Upper, Partial Lower   Pulmonary neg pulmonary ROS, neg sleep apnea, neg COPD, Patient abstained from smoking.Not current smoker, former smoker,    Pulmonary exam normal breath sounds clear to auscultation       Cardiovascular Exercise Tolerance: Good METShypertension, (-) CAD and (-) Past MI (-) dysrhythmias  Rhythm:Regular Rate:Normal - Systolic murmurs 1. Left ventricular ejection fraction, by visual estimation, is 50 to  55%. The left ventricle has normal function. There is no left ventricular  hypertrophy.  2. Global right ventricle has normal systolic function.The right  ventricular size is normal. No increase in right ventricular wall  thickness.  3. Left atrial size was mildly dilated.  4. Right atrial size was normal.  5. The mitral valve is normal in structure. Trace mitral valve  regurgitation.  6. The tricuspid valve is normal in structure. Tricuspid valve  regurgitation is trivial.  7. The aortic valve is normal in structure. Aortic valve regurgitation is  not visualized. No evidence of aortic valve sclerosis or stenosis.  8. The pulmonic valve was grossly normal. Pulmonic valve regurgitation is  not visualized.  9. The atrial septum is grossly normal.   Neuro/Psych PSYCHIATRIC DISORDERS Anxiety Depression Residual left hand weakness  Neuromuscular disease CVA, Residual Symptoms    GI/Hepatic GERD  Medicated and Controlled,(+)     (-) substance abuse  ,   Endo/Other  neg diabetesMorbid obesity  Renal/GU negative Renal ROS      Musculoskeletal  (+) Arthritis , Fibromyalgia -, narcotic dependent5-10 mg of hydrocodone per day   Abdominal (+) + obese,   Peds  Hematology   Anesthesia Other Findings Past Medical History: No date: Anxiety 08/05/2019: Carotid artery stenosis     Comment:  right No date: Depression No date: Fibromyalgia No date: GERD (gastroesophageal reflux disease) No date: Hyperlipidemia No date: Hypertension 2019: Osteoarthritis of left hip No date: Pneumonia     Comment:  01/16/2021 and 06/05/2021 No date: Pre-diabetes 08/05/2019: Stroke [facility])     Comment:  occlusion of right middle cerebral artery  Reproductive/Obstetrics                                                            Anesthesia Evaluation  Patient identified by MRN, date of birth, ID band Patient awake    Reviewed: Allergy & Precautions, H&P , NPO status , Patient's Chart, lab work & pertinent test results, reviewed documented beta blocker date and time   History of Anesthesia Complications Negative for: history of anesthetic complications  Airway Mallampati: I  TM Distance: >3 FB Neck ROM: full    Dental  (+) Dental Advidsory Given, Missing, Chipped, Poor Dentition, Teeth Intact   Pulmonary neg shortness of breath, neg sleep apnea, neg COPD, neg recent URI, Current Smoker,           Cardiovascular Exercise Tolerance: Good hypertension, (-) angina(-) CAD, (-) Past MI, (-) Cardiac Stents and (-) CABG (-) dysrhythmias (-)  Valvular Problems/Murmurs     Neuro/Psych neg Seizures PSYCHIATRIC DISORDERS Anxiety  Neuromuscular disease (fibromyalgia)    GI/Hepatic Neg liver ROS, GERD  ,  Endo/Other  neg diabetesMorbid obesity  Renal/GU negative Renal ROS  negative genitourinary   Musculoskeletal   Abdominal   Peds  Hematology negative hematology ROS (+)   Anesthesia Other Findings Past Medical History: No date: Anxiety No date: Fibromyalgia No date:  Hypertension 2019: Osteoarthritis of left hip   Reproductive/Obstetrics negative OB ROS                             Anesthesia Physical Anesthesia Plan  ASA: III  Anesthesia Plan: Spinal   Post-op Pain Management:    Induction: Intravenous  PONV Risk Score and Plan: 1  Airway Management Planned: Simple Face Mask and Natural Airway  Additional Equipment:   Intra-op Plan:   Post-operative Plan:   Informed Consent: I have reviewed the patients History and Physical, chart, labs and discussed the procedure including the risks, benefits and alternatives for the proposed anesthesia with the patient or authorized representative who has indicated his/her understanding and acceptance.   Dental Advisory Given  Plan Discussed with: Anesthesiologist, CRNA and Surgeon  Anesthesia Plan Comments:         Anesthesia Quick Evaluation  Anesthesia Physical Anesthesia Plan  ASA: 3  Anesthesia Plan: General   Post-op Pain Management:    Induction: Intravenous  PONV Risk Score and Plan: 4 or greater and Ondansetron, Dexamethasone, Midazolam and Treatment may vary due to age or medical condition  Airway Management Planned: Oral ETT  Additional Equipment: None  Intra-op Plan:   Post-operative Plan: Extubation in OR  Informed Consent: I have reviewed the patients History and Physical, chart, labs and discussed the procedure including the risks, benefits and alternatives for the proposed anesthesia with the patient or authorized representative who has indicated his/her understanding and acceptance.     Dental advisory given  Plan Discussed with: CRNA and Surgeon  Anesthesia Plan Comments: (Patient took Plavix 4 days ago, precluding spinal anesthesia. Discussed risks of anesthesia with patient, including PONV, sore throat, lip/dental damage. Rare risks discussed as well, such as cardiorespiratory and neurological sequelae, and allergic reactions.  Patient understands.)        Anesthesia Quick Evaluation

## 2021-07-12 NOTE — Evaluation (Signed)
Physical Therapy Evaluation Patient Details Name: Terri Wood MRN: [id number] DOB: 30-Jul-1966 Today's Date: 07/12/2021  History of Present Illness  Patient is a 55 year old female with primary osteoarthritis of right knee. Right TKA performed 07/12/21. Past medical history significant for HTN, former smoker, CVA with residual left hand weakness, morbid obesity, arthritis, fibromyalgia, narcotic dependence, osteoarthritis of left hip, anxiety, depression   Clinical Impression  Patient agreeable to PT. Patient reports she is independent with intermittent use of cane for ambulation at baseline. She lives with her mother.  Patient currently is alert but groggy at times. Patient able to participate with LE exercises in supine and seated position. Patient needs minimal assistance for RLE support for bed mobility and Min guard assistance for transfers. She was able to stand without knee buckling during pre-gait activity and with taking side steps along edge of bed. Further ambulation deferred for safety concerns due to grogginess. Patient is eager to go home at discharge.  Recommend to continue PT to maximize independence and facilitate return to prior level of function. Anticipate patient will be able to discharge to home with HHPT.       Recommendations for follow up therapy are one component of a multi-disciplinary discharge planning process, led by the attending physician.  Recommendations may be updated based on patient status, additional functional criteria and insurance authorization.  Follow Up Recommendations Home health PT    Equipment Recommendations  None recommended by PT    Recommendations for Other Services       Precautions / Restrictions Precautions Precautions: Knee Precaution Booklet Issued: Yes (comment) Restrictions Weight Bearing Restrictions: Yes RLE Weight Bearing: Weight bearing as tolerated      Mobility  Bed Mobility Overal bed mobility: Needs Assistance Bed  Mobility: Supine to Sit     Supine to sit: Min assist     General bed mobility comments: Min A for intermittent assistance for positioning of RLE. verbal cues for technique    Transfers Overall transfer level: Needs assistance Equipment used: Rolling walker (2 wheeled) Transfers: Sit to/from Stand Sit to Stand: Min guard         General transfer comment: Min guard for safety. verbal cues for technique  Ambulation/Gait Ambulation/Gait assistance: Min guard Gait Distance (Feet): 1 Feet Assistive device: Rolling walker (2 wheeled)       General Gait Details: pre-gait activity performed with weight shifting in standing with no loss of balance or knee buckling. patient able to take side steps along edge of bed however, further ambulation deferred for safety concerns as patient is groggy at times during activity  Stairs            Wheelchair Mobility    Modified Rankin (Stroke Patients Only)       Balance Overall balance assessment: Modified Independent;Needs assistance Sitting-balance support: Feet supported;No upper extremity supported Sitting balance-Leahy Scale: Good     Standing balance support: Bilateral upper extremity supported Standing balance-Leahy Scale: Fair Standing balance comment: patient is relying on rolling walker for activity in standing                             Pertinent Vitals/Pain Pain Assessment: 0-10 Pain Score: 7  Pain Location: right knee Pain Descriptors / Indicators: Sore Pain Intervention(s): Limited activity within patient's tolerance;RN gave pain meds during session;Monitored during session    Home Living Family/patient expects to be discharged to:: Private residence Living Arrangements: Parent Available Help at  Discharge: Family;Available 24 hours/day Type of Home: House Home Access: Stairs to enter Entrance Stairs-Rails: Left (on the left side while entering the home) Entrance Stairs-Number of Steps: 4 Home  Layout: One level Home Equipment: Cane - single point;Walker - 2 wheels;Walker - 4 wheels;Grab bars - tub/shower;Shower seat      Prior Function Level of Independence: Independent with assistive device(s)         Comments: Mod I with use of cane and shower chair as needed.     Hand Dominance   Dominant Hand: Right    Extremity/Trunk Assessment   Upper Extremity Assessment Upper Extremity Assessment:  (baseline left hand coordination issues after prior stroke. overall grossly WFL for functional activity)    Lower Extremity Assessment Lower Extremity Assessment: RLE deficits/detail;LLE deficits/detail RLE Deficits / Details: patient able to completed partial SLR without assistance. AAROM for full SLR, suspected more limited by pain than true weakness. no knee buckling with standing and weight bearing activity RLE Sensation: WNL LLE Deficits / Details: WFL observed with functional activity       Communication   Communication: No difficulties  Cognition Arousal/Alertness: Awake/alert (awake but groggy at times, susptected due to medication) Behavior During Therapy: WFL for tasks assessed/performed Overall Cognitive Status: Within Functional Limits for tasks assessed                                 General Comments: patient is able to follow single step commands with extra time      General Comments      Exercises Total Joint Exercises Quad Sets: AROM;Strengthening;Right;5 reps;Supine Straight Leg Raises: AROM;AAROM;Strengthening;Right;5 reps;Supine Long Arc Quad: AAROM;Strengthening;Right;Seated (x 1 rep) Goniometric ROM: right knee AROM 4-65 degrees   Assessment/Plan    PT Assessment Patient needs continued PT services  PT Problem List Decreased strength;Decreased range of motion;Decreased activity tolerance;Decreased balance;Decreased mobility;Pain       PT Treatment Interventions DME instruction;Gait training;Functional mobility training;Stair  training;Therapeutic activities;Therapeutic exercise;Balance training;Neuromuscular re-education;Patient/family education    PT Goals (Current goals can be found in the Care Plan section)  Acute Rehab PT Goals Patient Stated Goal: to go home PT Goal Formulation: With patient Time For Goal Achievement: 07/26/21 Potential to Achieve Goals: Good Additional Goals Additional Goal #1: patient will increase right knee flexion to 90 degrees for improved gait pattern and independence with functional activity    Frequency BID   Barriers to discharge        Co-evaluation               AM-PAC PT "6 Clicks" Mobility  Outcome Measure Help needed turning from your back to your side while in a flat bed without using bedrails?: A Little Help needed moving from lying on your back to sitting on the side of a flat bed without using bedrails?: A Little Help needed moving to and from a bed to a chair (including a wheelchair)?: A Little Help needed standing up from a chair using your arms (e.g., wheelchair or bedside chair)?: A Little Help needed to walk in hospital room?: A Little Help needed climbing 3-5 steps with a railing? : A Little 6 Click Score: 18    End of Session Equipment Utilized During Treatment: Gait belt Activity Tolerance: Patient tolerated treatment well Patient left:  (sitting up on edge of bed, OT and nurse present. family member at the bedside also.) Nurse Communication: Mobility status PT Visit Diagnosis: Other abnormalities  of gait and mobility (R26.89);Difficulty in walking, not elsewhere classified (R26.2);Pain Pain - Right/Left: Right Pain - part of body: Knee    Time: 1438-1510 PT Time Calculation (min) (ACUTE ONLY): 32 min   Charges:   PT Evaluation $PT Eval Moderate Complexity: 1 Mod PT Treatments $Therapeutic Activity: 8-22 mins        Donna Bernard, PT, MPT   Ina Homes 07/12/2021, 3:42 PM

## 2021-07-12 NOTE — Anesthesia Procedure Notes (Signed)
Procedure Name: Intubation Date/Time: 07/12/2021 7:28 AM Performed by: Jenkins Rouge, RN Pre-anesthesia Checklist: Patient identified, Emergency Drugs available, Suction available and Patient being monitored Patient Re-evaluated:Patient Re-evaluated prior to induction Oxygen Delivery Method: Circle system utilized Preoxygenation: Pre-oxygenation with 100% oxygen Induction Type: IV induction Ventilation: Mask ventilation without difficulty Laryngoscope Size: McGraph and 3 Grade View: Grade I Tube type: Oral Number of attempts: 1 Airway Equipment and Method: Stylet, Oral airway and Video-laryngoscopy Placement Confirmation: ETT inserted through vocal cords under direct vision, positive ETCO2 and breath sounds checked- equal and bilateral Secured at: 21 cm Tube secured with: Tape Dental Injury: Teeth and Oropharynx as per pre-operative assessment

## 2021-07-12 NOTE — Transfer of Care (Signed)
Immediate Anesthesia Transfer of Care Note  Patient: Terri Wood  Procedure(s) Performed: TOTAL KNEE ARTHROPLASTY (Right: Knee)  Patient Location: PACU  Anesthesia Type:General  Level of Consciousness: awake and patient cooperative  Airway & Oxygen Therapy: Patient Spontanous Breathing and Patient connected to face mask oxygen  Post-op Assessment: Report given to RN and Post -op Vital signs reviewed and stable  Post vital signs: Reviewed and stable  Last Vitals:  Vitals Value Taken Time  BP 124/69   Temp    Pulse 94 07/12/21 0943  Resp 17 07/12/21 0943  SpO2 93 % 07/12/21 0943  Vitals shown include unvalidated device data.  Last Pain:  Vitals:   07/12/21 0630  TempSrc: Oral  PainSc: 0-No pain         Complications: No notable events documented.

## 2021-07-13 DIAGNOSIS — M1711 Unilateral primary osteoarthritis, right knee: Secondary | ICD-10-CM | POA: Diagnosis not present

## 2021-07-13 LAB — CBC
HCT: 33.1 % — ABNORMAL LOW (ref 36.0–46.0)
Hemoglobin: 11 g/dL — ABNORMAL LOW (ref 12.0–15.0)
MCH: 31.2 pg (ref 26.0–34.0)
MCHC: 33.2 g/dL (ref 30.0–36.0)
MCV: 93.8 fL (ref 80.0–100.0)
Platelets: 235 10*3/uL (ref 150–400)
RBC: 3.53 MIL/uL — ABNORMAL LOW (ref 3.87–5.11)
RDW: 15.9 % — ABNORMAL HIGH (ref 11.5–15.5)
WBC: 9.6 10*3/uL (ref 4.0–10.5)
nRBC: 0 % (ref 0.0–0.2)

## 2021-07-13 LAB — BASIC METABOLIC PANEL
Anion gap: 6 (ref 5–15)
BUN: 14 mg/dL (ref 6–20)
CO2: 30 mmol/L (ref 22–32)
Calcium: 8.7 mg/dL — ABNORMAL LOW (ref 8.9–10.3)
Chloride: 105 mmol/L (ref 98–111)
Creatinine, Ser: 0.78 mg/dL (ref 0.44–1.00)
GFR, Estimated: >60 mL/min (ref >60)
Glucose, Bld: 143 mg/dL — ABNORMAL HIGH (ref 70–99)
Potassium: 4.5 mmol/L (ref 3.5–5.1)
Sodium: 141 mmol/L (ref 135–145)

## 2021-07-13 MED ORDER — METHOCARBAMOL 500 MG PO TABS: 500.0000 mg | ORAL_TABLET | Freq: Four times a day (QID) | ORAL | 0 refills | Status: DC | PRN

## 2021-07-13 MED ORDER — DOCUSATE SODIUM 100 MG PO CAPS: 100.0000 mg | ORAL_CAPSULE | Freq: Two times a day (BID) | ORAL | 0 refills | Status: DC

## 2021-07-13 MED ORDER — TRAMADOL HCL 50 MG PO TABS: 50.0000 mg | ORAL_TABLET | Freq: Four times a day (QID) | ORAL | 0 refills | Status: DC | PRN

## 2021-07-13 MED ORDER — LISINOPRIL 5 MG PO TABS: 5.0000 mg | ORAL_TABLET | Freq: Every day | ORAL | Status: DC

## 2021-07-13 MED ORDER — POLYETHYLENE GLYCOL 3350 17 G PO PACK: 17.0000 g | PACK | Freq: Every day | ORAL | 0 refills | Status: DC | PRN

## 2021-07-13 MED ORDER — MAGNESIUM HYDROXIDE 400 MG/5ML PO SUSP: 30.0000 mL | Freq: Once | ORAL | Status: AC

## 2021-07-13 MED ORDER — OXYCODONE HCL 5 MG PO TABS: 5.0000 mg | ORAL_TABLET | ORAL | 0 refills | Status: DC | PRN

## 2021-07-13 MED ORDER — FLEET ENEMA 7-19 GM/118ML RE ENEM: 1.0000 | ENEMA | Freq: Once | RECTAL | 0 refills | Status: DC | PRN

## 2021-07-13 MED ORDER — AMLODIPINE BESYLATE 5 MG PO TABS: 5.0000 mg | ORAL_TABLET | Freq: Every day | ORAL | Status: DC

## 2021-07-13 NOTE — TOC Initial Note (Signed)
Transition of Care [facility]) - Initial/Assessment Note    Patient Details  Name: Terri Wood MRN: [id number] Date of Birth: 1966/05/25  Transition [facility]) CM/SW Contact:    Caryn Section, RN Phone Number: 07/13/2021, 2:17 PM  Clinical Narrative:     patient lives at home with her mother.  Denies any concerns with transportation to appointments, is able to transport to outpatient PT.  Patient is able to obtain medications and take them as directed.  Walker ordered through [employer], to be delivered to room.  Patient states she has BSC at home already.  Patient will go to outpatient PT as per MD and she will discharge today.              Expected Discharge Plan: Home/Self Care (Patient will have outpatient physical therapy, set up by MD) Barriers to Discharge: Barriers Resolved   Patient Goals and CMS Choice Patient states their goals for this hospitalization and ongoing recovery are:: I will go home and get stronger   Choice offered to / list presented to : NA  Expected Discharge Plan and Services Expected Discharge Plan: Home/Self Care (Patient will have outpatient physical therapy, set up by MD)   Discharge Planning Services: CM Consult Post Acute Care Choice: Durable Medical Equipment Dan Humphreys to be provided by [employer]) Living arrangements for the past 2 months: Single Family Home Expected Discharge Date: 07/13/21               DME Arranged: Dan Humphreys rolling DME Agency: [employer] Date DME Agency Contacted: 07/13/21 Time DME Agency Contacted: [phone] Representative spoke with at DME Agency: Juliette Alcide Arranged:  (n/a patient going to outpatient PT)          Prior Living Arrangements/Services Living arrangements for the past 2 months: Single Family Home Lives with:: Self, Parents Patient language and need for interpreter reviewed:: Yes (No interpreter required) Do you feel safe going back to the place where you live?: Yes      Need for Family Participation in Patient Care:  Yes (Comment) Care giver support system in place?: Yes (comment) Current home services: Other (comment) (No current home services) Criminal Activity/Legal Involvement Pertinent to Current Situation/Hospitalization: No - Comment as needed  Activities of Daily Living Home Assistive Devices/Equipment: Eyeglasses, Cane (specify quad or straight), Shower chair with back ADL Screening (condition at time of admission) Patient's cognitive ability adequate to safely complete daily activities?: Yes Is the patient deaf or have difficulty hearing?: No Does the patient have difficulty seeing, even when wearing glasses/contacts?: No Does the patient have difficulty concentrating, remembering, or making decisions?: No Patient able to express need for assistance with ADLs?: Yes Does the patient have difficulty dressing or bathing?: No Independently performs ADLs?: Yes (appropriate for developmental age) Does the patient have difficulty walking or climbing stairs?: Yes Weakness of Legs: None Weakness of Arms/Hands: Left  Permission Sought/Granted   Permission granted to share information with : Yes, Verbal Permission Granted     Permission granted to share info w AGENCY: DME agency, outpatient PT        Emotional Assessment Appearance:: Appears stated age Attitude/Demeanor/Rapport: Gracious, Engaged Affect (typically observed): Pleasant, Appropriate Orientation: : Oriented to Self, Oriented to Place, Oriented to  Time, Oriented to Situation Alcohol / Substance Use: Not Applicable Psych Involvement: No (comment)  Admission diagnosis:  S/P TKR (total knee replacement) using cement, right [Z96.651] Patient Active Problem List   Diagnosis Date Noted   S/P TKR (total knee replacement) using cement,  right 07/12/2021   Multifocal pneumonia 06/05/2021   AKI (acute kidney injury) (HCC) 06/05/2021   Borderline hyperglycemia 06/05/2021   Generalized weakness 06/05/2021   Acute respiratory failure  (HCC) 01/16/2021   Community acquired pneumonia 01/16/2021   CVA, old, hemiparesis (HCC) 01/16/2021   Obesity, Class III, BMI 40-49.9 (morbid obesity) (HCC) 01/16/2021   Sepsis (HCC) 01/16/2021   Depression 01/16/2021   Essential hypertension 08/16/2019   Fibromyalgia 08/16/2019   Hyperlipidemia    Smoker    Acute CVA (cerebrovascular accident) (HCC) 08/15/2019   Avascular necrosis of left femoral head (HCC) 04/09/2018   Depression, unspecified 02/02/2015   PCP:  Miki Kins, FNP Pharmacy:   [facility] [id number] [place], [place] - [address] [place] [address] Phone: [phone] Fax: [phone]     Social Determinants of Health (SDOH) Interventions    Readmission Risk Interventions No flowsheet data found.

## 2021-07-13 NOTE — Progress Notes (Signed)
Patient declined to wear foam bone.

## 2021-07-13 NOTE — Discharge Summary (Signed)
Physician Discharge Summary  Patient ID: Terri Wood MRN: [id number] DOB/AGE: 1966/09/03 55 y.o.  Admit date: 07/12/2021 Discharge date: 07/13/2021  Admission Diagnoses:  S/P TKR (total knee replacement) using cement, right [Z96.651]   Discharge Diagnoses: Patient Active Problem List   Diagnosis Date Noted   S/P TKR (total knee replacement) using cement, right 07/12/2021   Multifocal pneumonia 06/05/2021   AKI (acute kidney injury) (HCC) 06/05/2021   Borderline hyperglycemia 06/05/2021   Generalized weakness 06/05/2021   Acute respiratory failure (HCC) 01/16/2021   Community acquired pneumonia 01/16/2021   CVA, old, hemiparesis (HCC) 01/16/2021   Obesity, Class III, BMI 40-49.9 (morbid obesity) (HCC) 01/16/2021   Sepsis (HCC) 01/16/2021   Depression 01/16/2021   Essential hypertension 08/16/2019   Fibromyalgia 08/16/2019   Hyperlipidemia    Smoker    Acute CVA (cerebrovascular accident) (HCC) 08/15/2019   Avascular necrosis of left femoral head (HCC) 04/09/2018   Depression, unspecified 02/02/2015    Past Medical History:  Diagnosis Date   Anxiety    Carotid artery stenosis 08/05/2019   right   Depression    Fibromyalgia    GERD (gastroesophageal reflux disease)    Hyperlipidemia    Hypertension    Osteoarthritis of left hip 2019   Pneumonia    01/16/2021 and 06/05/2021   Pre-diabetes    Stroke (HCC) 08/05/2019   occlusion of right middle cerebral artery     Transfusion: none   Consultants (if any):   Discharged Condition: Improved  Hospital Course: Terri Wood is an 55 y.o. female who was admitted 07/12/2021 with a diagnosis of right knee osteoarthritis and went to the operating room on 07/12/2021 and underwent the above named procedures.    Surgeries: Procedure(s): TOTAL KNEE ARTHROPLASTY on 07/12/2021 Patient tolerated the surgery well. Taken to PACU where she was stabilized and then transferred to the orthopedic floor.  Started on aspirin and plavix.  SCDs applied bilaterally at 80 mm. Heels elevated on bed with rolled towels. No evidence of DVT. Negative Homan. Physical therapy started on day #1 for gait training and transfer. OT started day #1 for ADL and assisted devices.  Patient's foley was d/c on day #1. Patient's IV was d/c on day #1.  On post op day #1 patient was stable and ready for discharge to home with outpatient PT    She was given perioperative antibiotics:  Anti-infectives (From admission, onward)    Start     Dose/Rate Route Frequency Ordered Stop   07/12/21 1330  ceFAZolin (ANCEF) IVPB 2g/100 mL premix        2 g 200 mL/hr over 30 Minutes Intravenous Every 6 hours 07/12/21 1124 07/12/21 2024   07/12/21 0618  ceFAZolin (ANCEF) 2-4 GM/100ML-% IVPB       Note to Pharmacy: Junius Creamer   : cabinet override      07/12/21 0618 07/12/21 0736   07/12/21 0600  ceFAZolin (ANCEF) IVPB 2g/100 mL premix        2 g 200 mL/hr over 30 Minutes Intravenous On call to O.R. 07/12/21 0038 07/12/21 0805     .  She was given sequential compression devices, early ambulation, and aspirin, Plavix, teds for DVT prophylaxis.  She benefited maximally from the hospital stay and there were no complications.    Recent vital signs:  Vitals:   07/13/21 0817 07/13/21 0907  BP: (!) 100/58 123/71  Pulse: 81 83  Resp: 14   Temp: 98.5 F (36.9 C)   SpO2: 100%  Recent laboratory studies:  Lab Results  Component Value Date   HGB 11.0 (L) 07/13/2021   HGB 13.2 06/30/2021   HGB 12.5 06/07/2021   Lab Results  Component Value Date   WBC 9.6 07/13/2021   PLT 235 07/13/2021   Lab Results  Component Value Date   INR 1.0 06/05/2021   Lab Results  Component Value Date   NA 141 07/13/2021   K 4.5 07/13/2021   CL 105 07/13/2021   CO2 30 07/13/2021   BUN 14 07/13/2021   CREATININE 0.78 07/13/2021   GLUCOSE 143 (H) 07/13/2021    Discharge Medications:   Allergies as of 07/13/2021       Reactions   Penicillins Hives    TOLERATED ROCEPHIN AND CEFAZOLIN Did it involve swelling of the face/tongue/throat, SOB, or low BP? Yes Did it involve sudden or severe rash/hives, skin peeling, or any reaction on the inside of your mouth or nose? No Did you need to seek medical attention at a hospital or doctor's office? No When did it last happen? Within the past 10 years  If all above answers are "NO", may proceed with cephalosporin use.   Levofloxacin    Dizziness         Medication List     STOP taking these medications    HYDROcodone-acetaminophen 5-325 MG tablet Commonly known as: NORCO/VICODIN       TAKE these medications    amLODipine 10 MG tablet Commonly known as: NORVASC Hold until followup with your outpatient doctor because your blood pressure has been on the low side. What changed:  how much to take how to take this when to take this additional instructions   aspirin 325 MG EC tablet Take 325 mg by mouth daily.   atorvastatin 40 MG tablet Commonly known as: LIPITOR Take 40 mg by mouth at bedtime.   baclofen 10 MG tablet Commonly known as: LIORESAL Take 5 mg by mouth at bedtime as needed for muscle spasms.   buPROPion 150 MG 24 hr tablet Commonly known as: WELLBUTRIN XL Take 150 mg by mouth daily.   Cal Mag Zinc +D3 Tabs Take 1 tablet by mouth daily.   CALTRATE 600+D PO Take 1 tablet by mouth daily.   celecoxib 200 MG capsule Commonly known as: CELEBREX Take 200 mg by mouth daily.   clopidogrel 75 MG tablet Commonly known as: PLAVIX Take 75 mg by mouth daily.   diclofenac Sodium 1 % Gel Commonly known as: VOLTAREN Apply 1 application topically 3 (three) times daily as needed (pain).   docusate sodium 100 MG capsule Commonly known as: COLACE Take 1 capsule (100 mg total) by mouth 2 (two) times daily.   DULoxetine 60 MG capsule Commonly known as: CYMBALTA Take 60 mg by mouth 2 (two) times daily.   Gemtesa 75 MG Tabs Generic drug: Vibegron Take 1 tablet by mouth  daily.   lisinopril 10 MG tablet Commonly known as: ZESTRIL Take 5 mg by mouth at bedtime.   MENOPAUSE RELIEF PO Take 2 tablets by mouth daily. Amberen   methocarbamol 500 MG tablet Commonly known as: ROBAXIN Take 1 tablet (500 mg total) by mouth every 6 (six) hours as needed for muscle spasms.   MULTI FOR HER 50+ PO Take 1 tablet by mouth daily.   Omega 3 1000 MG Caps Take 1,000 mg by mouth daily with breakfast.   omeprazole 40 MG capsule Commonly known as: PRILOSEC Take 40 mg by mouth daily.   oxyCODONE 5  MG immediate release tablet Commonly known as: Oxy IR/ROXICODONE Take 1-2 tablets (5-10 mg total) by mouth every 4 (four) hours as needed for moderate pain (pain score 4-6).   OZEMPIC (2 MG/DOSE) [place] Inject 2 mg into the skin once a week. Friday   polyethylene glycol 17 g packet Commonly known as: MIRALAX / GLYCOLAX Take 17 g by mouth daily as needed for mild constipation.   pregabalin 150 MG capsule Commonly known as: LYRICA Take 150 mg by mouth 2 (two) times daily.   pregabalin 200 MG capsule Commonly known as: LYRICA Take 200 mg by mouth 2 (two) times daily.   sodium phosphate 7-19 GM/118ML Enem Place 133 mLs (1 enema total) rectally once as needed for severe constipation.   traMADol 50 MG tablet Commonly known as: ULTRAM Take 1 tablet (50 mg total) by mouth every 6 (six) hours as needed.   Vitamin D3 125 MCG (5000 UT) Caps Take 5,000 Units by mouth daily.   vortioxetine HBr 5 MG Tabs tablet Commonly known as: TRINTELLIX Take 5 mg by mouth daily.               Durable Medical Equipment  (From admission, onward)           Start     Ordered   07/12/21 1125  DME Walker rolling  Once       Question Answer Comment  Walker: With 5 Inch Wheels   Patient needs a walker to treat with the following condition S/P TKR (total knee replacement) using cement, right      07/12/21 1124   07/12/21 1125  DME 3 n 1  Once        07/12/21 1124   07/12/21  1125  DME Bedside commode  Once       Question:  Patient needs a bedside commode to treat with the following condition  Answer:  S/P TKR (total knee replacement) using cement, right   07/12/21 1124            Diagnostic Studies: DG Chest 2 View  Result Date: 06/30/2021 CLINICAL DATA:  56 year old female with follow-up pneumonia EXAM: CHEST - 2 VIEW COMPARISON:  06/05/2021, 01/16/2021 FINDINGS: Cardiomediastinal silhouette unchanged in size and contour. No evidence of central vascular congestion. Mild asymmetric elevation of the right hemidiaphragm. There has been relative clearing of the previous reticulonodular opacities on chest x-ray dated 01/16/2021, with no new confluent airspace disease, pneumothorax, or pleural effusion. IMPRESSION: No evidence of acute cardiopulmonary disease Electronically Signed   By: Gilmer Mor D.O.   On: 06/30/2021 12:41   DG Knee 1-2 Views Right  Result Date: 07/12/2021 CLINICAL DATA:  Postop pain.  Status post right knee replacement. EXAM: RIGHT KNEE - 1-2 VIEW COMPARISON:  Right knee radiographs 12/17/2020 and CT 05/25/2021 FINDINGS: Sequelae of total knee arthroplasty are identified. The prosthetic components appear normally positioned without evidence of an acute fracture. Postoperative gas is noted in the surrounding soft tissues, and skin staples are in place. IMPRESSION: Status post right total knee arthroplasty without evidence of acute osseous abnormality. Electronically Signed   By: Sebastian Ache M.D.   On: 07/12/2021 10:37    Disposition:      Follow-up Information     Evon Slack, PA-C Follow up in 2 week(s).   Specialties: Orthopedic Surgery, Emergency Medicine Contact information: [address] [place] [address] [phone]  Signed: Amador Cunas CHRISTOPHER 07/13/2021, 11:50 AM

## 2021-07-13 NOTE — Progress Notes (Signed)
Physical Therapy Treatment Patient Details Name: Lynessa Almanzar MRN: [id number] DOB: [birth year] Today's Date: 07/13/2021   History of Present Illness Patient is a 55 year old female with primary osteoarthritis of right knee. Right TKA performed 07/12/21. Past medical history significant for HTN, former smoker, CVA with residual left hand weakness, morbid obesity, arthritis, fibromyalgia, narcotic dependence, osteoarthritis of left hip, anxiety, depression    PT Comments    Pt was sitting in recliner upon arriving. She agrees to session and is cooperative and pleasant throughout. Very motivated to improve and DC home. She had already been OOB to recliner with OT prior to this session beginning. She stood to [employer] with supervision. No balance deficits however does endorse 8-9/10 pain throughout. She ambulated 150 ft with slow cadence however safe. Returned to room and was educated on what to expect going forward. Will address ROM and strengthening this afternoon. If pt plans to DC this evening, will perform stair training also. Pt will benefit from continued skilled PT to address deficits while maximizing independence with ADLs.    Recommendations for follow up therapy are one component of a multi-disciplinary discharge planning process, led by the attending physician.  Recommendations may be updated based on patient status, additional functional criteria and insurance authorization.  Follow Up Recommendations  Outpatient PT;Home health PT (Outpatient if unable to have [facility])     Equipment Recommendations  None recommended by PT       Precautions / Restrictions Precautions Precautions: Knee Precaution Booklet Issued: Yes (comment) Restrictions Weight Bearing Restrictions: Yes RLE Weight Bearing: Weight bearing as tolerated     Mobility  Bed Mobility Overal bed mobility: Needs Assistance Bed Mobility: Sit to Supine     Supine to sit: Supervision     General bed mobility comments: Pt was in  recliner pre/post session    Transfers Overall transfer level: Needs assistance Equipment used: Rolling walker (2 wheeled) Transfers: Sit to/from Stand Sit to Stand: Supervision Stand pivot transfers: Min guard       General transfer comment: Pt demonstrated safe ability to stand and sit with goos control. No safety concerns during transfers.  Ambulation/Gait Ambulation/Gait assistance: Supervision Gait Distance (Feet): 150 Feet Assistive device: Rolling walker (2 wheeled) Gait Pattern/deviations: Step-through pattern;Antalgic;Trunk flexed Gait velocity: decreased   General Gait Details: pt was able to safely ambulate with RW without LOB or safety concerns. Does have slow antalgic step to pattern.     Balance Overall balance assessment: Modified Independent Sitting-balance support: Feet supported;No upper extremity supported Sitting balance-Leahy Scale: Good     Standing balance support: During functional activity;Bilateral upper extremity supported Standing balance-Leahy Scale: Good       Cognition Arousal/Alertness: Awake/alert Behavior During Therapy: WFL for tasks assessed/performed Overall Cognitive Status: Within Functional Limits for tasks assessed      General Comments: Pt is A and O x 4. cooperative and pleasant throughout. Eager to get better and get home.      Exercises Other Exercises Other Exercises: Pt educated re: falls prevention, adapted dressing technqiues, polar care mgmt Other Exercises: LBD, sup>sit, sit<>stand, SPT, sitting/standing balance/tolerance        Pertinent Vitals/Pain Pain Assessment: 0-10 Pain Score: 8  Pain Location: right knee Pain Descriptors / Indicators: Sore;Grimacing;Guarding Pain Intervention(s): Limited activity within patient's tolerance;Monitored during session;Premedicated before session;Repositioned;Ice applied     PT Goals (current goals can now be found in the care plan section) Acute Rehab PT Goals Patient  Stated Goal: to go home Progress towards PT  goals: Progressing toward goals    Frequency    BID      PT Plan Current plan remains appropriate       AM-PAC PT "6 Clicks" Mobility   Outcome Measure  Help needed turning from your back to your side while in a flat bed without using bedrails?: A Little Help needed moving from lying on your back to sitting on the side of a flat bed without using bedrails?: A Little Help needed moving to and from a bed to a chair (including a wheelchair)?: A Little Help needed standing up from a chair using your arms (e.g., wheelchair or bedside chair)?: A Little Help needed to walk in hospital room?: A Little Help needed climbing 3-5 steps with a railing? : A Little 6 Click Score: 18    End of Session Equipment Utilized During Treatment: Gait belt Activity Tolerance: Patient tolerated treatment well Patient left: in chair;with call bell/phone within reach;with chair alarm set;Other (comment) (polar care and towel roll in place) Nurse Communication: Mobility status PT Visit Diagnosis: Other abnormalities of gait and mobility (R26.89);Difficulty in walking, not elsewhere classified (R26.2);Pain Pain - Right/Left: Right Pain - part of body: Knee     Time: 8325-4982 PT Time Calculation (min) (ACUTE ONLY): 30 min  Charges:  $Gait Training: 8-22 mins $Therapeutic Activity: 8-22 mins                    Jetta Lout PTA 07/13/21, 12:14 PM

## 2021-07-13 NOTE — Progress Notes (Addendum)
Occupational Therapy Treatment Patient Details Name: Terri Wood MRN: [id number] DOB: 03/02/1966 Today's Date: 07/13/2021   History of present illness Patient is a 55 year old female with primary osteoarthritis of right knee. Right TKA performed 07/12/21. Past medical history significant for HTN, former smoker, CVA with residual left hand weakness, morbid obesity, arthritis, fibromyalgia, narcotic dependence, osteoarthritis of left hip, anxiety, depression   OT comments  Terri Wood was seen for OT treatment on this date. Upon arrival to room pt reclined in bed, agreeable to session. Pt reports 8/10 knee pain t/o session (RN aware, pt premedicated). Pt requires SUP exit LR side of bed, good dynamic sitting balance. SUPERVISION don L sock, MOD A don R sock seated EOB. CGA + RW for bed>chair SPT, requires cues for safety and hand placement. Pt defers further mobility at this time in anticipation of morning PT session. Pt making progress toward goals. Pt continues to benefit from skilled OT services to maximize return to PLOF and minimize risk of future falls, injury, caregiver burden, and readmission. Will continue to follow POC. Discharge recommendation updated to reflect pt progress.     Recommendations for follow up therapy are one component of a multi-disciplinary discharge planning process, led by the attending physician.  Recommendations may be updated based on patient status, additional functional criteria and insurance authorization.    Follow Up Recommendations  No OT follow up    Equipment Recommendations  None recommended by OT    Recommendations for Other Services      Precautions / Restrictions Precautions Precautions: Knee Restrictions Weight Bearing Restrictions: Yes RLE Weight Bearing: Weight bearing as tolerated       Mobility Bed Mobility Overal bed mobility: Needs Assistance Bed Mobility: Sit to Supine     Supine to sit: Supervision          Transfers Overall  transfer level: Needs assistance Equipment used: Rolling walker (2 wheeled) Transfers: Sit to/from [employer] Sit to Stand: Min guard Stand pivot transfers: Min guard       General transfer comment: cues for hand placement    Balance Overall balance assessment: Needs assistance Sitting-balance support: Feet supported;No upper extremity supported Sitting balance-Leahy Scale: Good     Standing balance support: Bilateral upper extremity supported Standing balance-Leahy Scale: Fair                             ADL either performed or assessed with clinical judgement   ADL Overall ADL's : Needs assistance/impaired                                       General ADL Comments: SUPERVISION don L sock, MOD A don R sock seated EOB. CGA + RW for ADL t/f      Cognition Arousal/Alertness: Awake/alert Behavior During Therapy: WFL for tasks assessed/performed Overall Cognitive Status: Within Functional Limits for tasks assessed                                          Exercises Exercises: Other exercises Other Exercises Other Exercises: Pt educated re: falls prevention, adapted dressing technqiues, polar care mgmt Other Exercises: LBD, sup>sit, sit<>stand, SPT, sitting/standing balance/tolerance           Pertinent Vitals/ Pain  Pain Assessment: 0-10 Pain Score: 8  Pain Location: right knee Pain Descriptors / Indicators: Sore;Grimacing;Guarding Pain Intervention(s): Limited activity within patient's tolerance;Repositioned;Patient requesting pain meds-RN notified   Frequency  Min 2X/week        Progress Toward Goals  OT Goals(current goals can now be found in the care plan section)  Progress towards OT goals: Progressing toward goals  Acute Rehab OT Goals Patient Stated Goal: to go home OT Goal Formulation: With patient/family Time For Goal Achievement: 07/26/21 Potential to Achieve Goals: Good ADL  Goals Pt Will Perform Lower Body Dressing: with modified independence;with adaptive equipment;sit to/from stand Pt Will Transfer to Toilet: with supervision;ambulating Additional ADL Goal #1: Pt will independently instruct family in polar care mgt Additional ADL Goal #2: Pt will independently instruct family in compression stocking mgt  Plan Frequency remains appropriate;Discharge plan needs to be updated    Co-evaluation                 AM-PAC OT "6 Clicks" Daily Activity     Outcome Measure   Help from another person eating meals?: None Help from another person taking care of personal grooming?: A Little Help from another person toileting, which includes using toliet, bedpan, or urinal?: A Little Help from another person bathing (including washing, rinsing, drying)?: A Little Help from another person to put on and taking off regular upper body clothing?: None Help from another person to put on and taking off regular lower body clothing?: A Little 6 Click Score: 20    End of Session Equipment Utilized During Treatment: Rolling walker  OT Visit Diagnosis: Other abnormalities of gait and mobility (R26.89);Pain Pain - Right/Left: Right Pain - part of body: Knee   Activity Tolerance Patient tolerated treatment well   Patient Left in chair;with call bell/phone within reach;with chair alarm set   Nurse Communication Patient requests pain meds        Time: 6333-5456 OT Time Calculation (min): 17 min  Charges: OT General Charges $OT Visit: 1 Visit OT Treatments $Self Care/Home Management : 8-22 mins  Kathie Dike, M.S. OTR/L  07/13/21, 10:29 AM  ascom [phone]

## 2021-07-13 NOTE — Progress Notes (Signed)
Physical Therapy Treatment Patient Details Name: Terri Wood MRN: [id number] DOB: [birth year] Today's Date: 07/13/2021   History of Present Illness Patient is a 55 year old female with primary osteoarthritis of right knee. Right TKA performed 07/12/21. Past medical history significant for HTN, former smoker, CVA with residual left hand weakness, morbid obesity, arthritis, fibromyalgia, narcotic dependence, osteoarthritis of left hip, anxiety, depression    PT Comments    Pt was long sitting in bed upon arriving with supportive caregiver at bedside. She was able to perform all desired task without difficulty or safety concern. Pain most limiting factor however was still able to exit bed, stand, and ambulate without safety concern. Performed ascending/descending stairs(4) with +1 rail and side stepping technique. Overall pt demonstrates safe enough abilities to DC home with OP PT to continue to progress strength/ROM/ and independence with ADLs.   Recommendations for follow up therapy are one component of a multi-disciplinary discharge planning process, led by the attending physician.  Recommendations may be updated based on patient status, additional functional criteria and insurance authorization.  Follow Up Recommendations  Outpatient PT     Equipment Recommendations  None recommended by PT       Precautions / Restrictions Precautions Precautions: Knee Precaution Booklet Issued: Yes (comment) Restrictions Weight Bearing Restrictions: Yes RLE Weight Bearing: Weight bearing as tolerated     Mobility  Bed Mobility Overal bed mobility: Needs Assistance Bed Mobility: Supine to Sit;Sit to Supine     Supine to sit: Supervision Sit to supine: Min assist   General bed mobility comments: Pt was in recliner pre/post session    Transfers Overall transfer level: Needs assistance Equipment used: Rolling walker (2 wheeled) Transfers: Sit to/from Stand Sit to Stand: Supervision          General transfer comment: Pt was able to stand from transport chair and from EOB with Vcs for handplacement and improved techbnique only  Ambulation/Gait Ambulation/Gait assistance: Supervision Gait Distance (Feet): 160 Feet Assistive device: Rolling walker (2 wheeled) Gait Pattern/deviations: Step-through pattern;Antalgic Gait velocity: decreased   General Gait Details: no LOB or safety concerns   Stairs Stairs: Yes Stairs assistance: Min guard Stair Management: One rail Right;Step to pattern;Sideways Number of Stairs: 4 General stair comments: pt was able to safely ascend/descend 4 stair with 1 rail     Balance Overall balance assessment: Modified Independent         Standing balance support: During functional activity;Bilateral upper extremity supported Standing balance-Leahy Scale: Good        Cognition Arousal/Alertness: Awake/alert Behavior During Therapy: WFL for tasks assessed/performed Overall Cognitive Status: Within Functional Limits for tasks assessed      General Comments: Pt is A and O x 4. cooperative and pleasant throughout. Eager to get better and get home.             Pertinent Vitals/Pain Pain Assessment: 0-10 Pain Score: 6  Pain Location: right knee Pain Descriptors / Indicators: Sore;Grimacing;Guarding Pain Intervention(s): Limited activity within patient's tolerance;Monitored during session;Premedicated before session;Repositioned     PT Goals (current goals can now be found in the care plan section) Acute Rehab PT Goals Patient Stated Goal: to go home Progress towards PT goals: Progressing toward goals    Frequency    BID      PT Plan Current plan remains appropriate       AM-PAC PT "6 Clicks" Mobility   Outcome Measure  Help needed turning from your back to your side while in a flat  bed without using bedrails?: A Little Help needed moving from lying on your back to sitting on the side of a flat bed without using  bedrails?: A Little Help needed moving to and from a bed to a chair (including a wheelchair)?: A Little Help needed standing up from a chair using your arms (e.g., wheelchair or bedside chair)?: A Little Help needed to walk in hospital room?: A Little Help needed climbing 3-5 steps with a railing? : A Little 6 Click Score: 18    End of Session Equipment Utilized During Treatment: Gait belt Activity Tolerance: Patient tolerated treatment well Patient left: in bed;with call bell/phone within reach;with bed alarm set;with SCD's reapplied;with family/visitor present Nurse Communication: Mobility status PT Visit Diagnosis: Other abnormalities of gait and mobility (R26.89);Difficulty in walking, not elsewhere classified (R26.2);Pain Pain - Right/Left: Right Pain - part of body: Knee     Time: 3559-7416 PT Time Calculation (min) (ACUTE ONLY): 30 min  Charges:  $Gait Training: 23-37 mins $Therapeutic Activity: 8-22 mins                     Jetta Lout PTA 07/13/21, 3:09 PM

## 2021-07-13 NOTE — Discharge Instructions (Signed)

## 2021-07-13 NOTE — Progress Notes (Signed)
   Subjective: 1 Day Post-Op Procedure(s) (LRB): TOTAL KNEE ARTHROPLASTY (Right) Patient reports pain as moderate.   Patient is well, and has had no acute complaints or problems Denies any CP, SOB, ABD pain. We will continue therapy today.  Plan is to go Home after hospital stay.  Objective: Vital signs in last 24 hours: Temp:  [97.7 F (36.5 C)-98.9 F (37.2 C)] 97.7 F (36.5 C) (09/28 0341) Pulse Rate:  [79-97] 85 (09/28 0341) Resp:  [11-18] 17 (09/28 0341) BP: (100-138)/(56-83) 116/68 (09/28 0341) SpO2:  [92 %-100 %] 95 % (09/28 0341)  Intake/Output from previous day: 09/27 0701 - 09/28 0700 In: 2765.5 [I.V.:2365.5; IV Piggyback:400] Out: 320 [Urine:300; Blood:20] Intake/Output this shift: No intake/output data recorded.  Recent Labs    07/13/21 0429  HGB 11.0*   Recent Labs    07/13/21 0429  WBC 9.6  RBC 3.53*  HCT 33.1*  PLT 235   Recent Labs    07/13/21 0429  NA 141  K 4.5  CL 105  CO2 30  BUN 14  CREATININE 0.78  GLUCOSE 143*  CALCIUM 8.7*   No results for input(s): LABPT, INR in the last 72 hours.  EXAM General - Patient is Alert, Appropriate, and Oriented Extremity - Neurovascular intact Sensation intact distally Intact pulses distally Dorsiflexion/Plantar flexion intact Dressing - dressing C/D/I and no drainage, Prevena intact without drainage Motor Function - intact, moving foot and toes well on exam.   Past Medical History:  Diagnosis Date   Anxiety    Carotid artery stenosis 08/05/2019   right   Depression    Fibromyalgia    GERD (gastroesophageal reflux disease)    Hyperlipidemia    Hypertension    Osteoarthritis of left hip 2019   Pneumonia    01/16/2021 and 06/05/2021   Pre-diabetes    Stroke (HCC) 08/05/2019   occlusion of right middle cerebral artery    Assessment/Plan:   1 Day Post-Op Procedure(s) (LRB): TOTAL KNEE ARTHROPLASTY (Right) Active Problems:   S/P TKR (total knee replacement) using cement,  right  Estimated body mass index is 49.43 kg/m as calculated from the following:   Height as of this encounter: 5' (1.524 m).   Weight as of this encounter: 114.8 kg. Advance diet Up with therapy Work on bowel movement Pain well controlled Vital signs stable.  Blood pressure running a little low.  Patient asymptomatic.  We will hold blood pressure medication today and continue to monitor. Labs are stable Care management to assist with discharge to home with home health PT pending completion of PT goals  DVT Prophylaxis - Aspirin and SCDs and Plavix Weight-Bearing as tolerated to right leg   T. Cranston Neighbor, PA-C [facility] 07/13/2021, 8:12 AM

## 2021-12-16 ENCOUNTER — Other Ambulatory Visit: Payer: Self-pay | Admitting: Orthopedic Surgery

## 2021-12-16 DIAGNOSIS — M1712 Unilateral primary osteoarthritis, left knee: Secondary | ICD-10-CM

## 2021-12-29 ENCOUNTER — Other Ambulatory Visit: Payer: Self-pay

## 2021-12-29 ENCOUNTER — Ambulatory Visit
Admission: RE | Admit: 2021-12-29 | Discharge: 2021-12-29 | Disposition: A | Discharge status: Home / Self Care | Payer: Medicaid Other | Source: Ambulatory Visit | Attending: Orthopedic Surgery | Admitting: Orthopedic Surgery

## 2021-12-29 DIAGNOSIS — M1712 Unilateral primary osteoarthritis, left knee: Secondary | ICD-10-CM | POA: Diagnosis not present

## 2022-01-17 ENCOUNTER — Other Ambulatory Visit: Payer: Self-pay | Admitting: Orthopedic Surgery

## 2022-02-06 ENCOUNTER — Other Ambulatory Visit: Payer: Self-pay

## 2022-02-06 ENCOUNTER — Encounter
Admission: RE | Admit: 2022-02-06 | Discharge: 2022-02-06 | Disposition: A | Discharge status: Home / Self Care | Payer: Medicare HMO | Source: Ambulatory Visit | Attending: Orthopedic Surgery | Admitting: Orthopedic Surgery

## 2022-02-06 DIAGNOSIS — Z01818 Encounter for other preprocedural examination: Secondary | ICD-10-CM | POA: Insufficient documentation

## 2022-02-06 LAB — CBC WITH DIFFERENTIAL/PLATELET
Abs Immature Granulocytes: 0.02 10*3/uL (ref 0.00–0.07)
Basophils Absolute: 0 10*3/uL (ref 0.0–0.1)
Basophils Relative: 1 %
Eosinophils Absolute: 0.1 10*3/uL (ref 0.0–0.5)
Eosinophils Relative: 1 %
HCT: 40.1 % (ref 36.0–46.0)
Hemoglobin: 12.6 g/dL (ref 12.0–15.0)
Immature Granulocytes: 0 %
Lymphocytes Relative: 36 %
Lymphs Abs: 2.2 10*3/uL (ref 0.7–4.0)
MCH: 29.3 pg (ref 26.0–34.0)
MCHC: 31.4 g/dL (ref 30.0–36.0)
MCV: 93.3 fL (ref 80.0–100.0)
Monocytes Absolute: 0.7 10*3/uL (ref 0.1–1.0)
Monocytes Relative: 11 %
Neutro Abs: 3.1 10*3/uL (ref 1.7–7.7)
Neutrophils Relative %: 51 %
Platelets: 271 10*3/uL (ref 150–400)
RBC: 4.3 MIL/uL (ref 3.87–5.11)
RDW: 15.4 % (ref 11.5–15.5)
WBC: 6.1 10*3/uL (ref 4.0–10.5)
nRBC: 0 % (ref 0.0–0.2)

## 2022-02-06 LAB — COMPREHENSIVE METABOLIC PANEL WITH GFR
ALT: 23 U/L (ref 0–44)
AST: 20 U/L (ref 15–41)
Albumin: 3.8 g/dL (ref 3.5–5.0)
Alkaline Phosphatase: 87 U/L (ref 38–126)
Anion gap: 7 (ref 5–15)
BUN: 20 mg/dL (ref 6–20)
CO2: 30 mmol/L (ref 22–32)
Calcium: 9.3 mg/dL (ref 8.9–10.3)
Chloride: 99 mmol/L (ref 98–111)
Creatinine, Ser: 0.73 mg/dL (ref 0.44–1.00)
GFR, Estimated: >60 mL/min
Glucose, Bld: 91 mg/dL (ref 70–99)
Potassium: 4.5 mmol/L (ref 3.5–5.1)
Sodium: 136 mmol/L (ref 135–145)
Total Bilirubin: 0.5 mg/dL (ref 0.3–1.2)
Total Protein: 7.1 g/dL (ref 6.5–8.1)

## 2022-02-06 LAB — URINALYSIS, ROUTINE W REFLEX MICROSCOPIC
Bilirubin Urine: NEGATIVE
Glucose, UA: NEGATIVE mg/dL
Hgb urine dipstick: NEGATIVE
Ketones, ur: NEGATIVE mg/dL
Leukocytes,Ua: NEGATIVE
Nitrite: NEGATIVE
Protein, ur: NEGATIVE mg/dL
Specific Gravity, Urine: 1.017 (ref 1.005–1.030)
pH: 6 (ref 5.0–8.0)

## 2022-02-06 LAB — TYPE AND SCREEN
ABO/RH(D): A NEG
Antibody Screen: NEGATIVE

## 2022-02-06 NOTE — Patient Instructions (Addendum)
Your procedure is scheduled on: Thursday 02/16/22 ?Report to the Registration Desk on the 1st floor of the [facility]. ?To find out your arrival time, please call [phone] between 1PM - 3PM on: Wednesday 02/15/22 ? ?REMEMBER: ?Instructions that are not followed completely may result in serious medical risk, up to and including death; or upon the discretion of your surgeon and anesthesiologist your surgery may need to be rescheduled. ? ?Do not eat food after midnight the night before surgery.  ?No gum chewing, lozengers or hard candies. ? ?You may however, drink CLEAR liquids up to 2 hours before you are scheduled to arrive for your surgery. Do not drink anything within 2 hours of your scheduled arrival time. ? ?Clear liquids include: ?- water  ?- apple juice without pulp ?- gatorade (not RED colors) ?- black coffee or tea (Do NOT add milk or creamers to the coffee or tea) ?Do NOT drink anything that is not on this list. ? ?In addition, your doctor has ordered for you to drink the provided  ?Ensure Pre-Surgery Clear Carbohydrate Drink  ?Drinking this carbohydrate drink up to two hours before surgery helps to reduce insulin resistance and improve patient outcomes. Please complete drinking 2 hours prior to scheduled arrival time. ? ?TAKE THESE MEDICATIONS THE MORNING OF SURGERY WITH A SIP OF WATER: ?buPROPion (WELLBUTRIN XL) 150 MG 24 hr tablet ?DULoxetine (CYMBALTA) 60 MG capsule ?pregabalin (LYRICA) 200 MG capsule ?vortioxetine HBr (TRINTELLIX) 5 MG TABS tablet ? ?omeprazole (PRILOSEC) 40 MG capsule (take one the night before and one on the morning of surgery - helps to prevent nausea after surgery.) ? ?Hold Plavix for 5 days. Last dose on Friday 02/10/22. ? ?One week prior to surgery: ?Stop Anti-inflammatories (NSAIDS) such as  Advil, Aleve, Ibuprofen, Motrin, Naproxen, Naprosyn and Aspirin based products such as Excedrin, Goodys Powder, BC Powder. ? ?Stop taking your Calcium Carbonate-Vitamin D (CALTRATE 600+D  PO), Cholecalciferol (VITAMIN D3) 125 MCG (5000 UT) CAPS, Multiple Minerals-Vitamins (CAL MAG ZINC +D3) TABS, Multiple Vitamins-Minerals (MULTI FOR HER 50+ PO), Omega 3 1000 MG CAPS, Specialty Vitamins Products (MENOPAUSE RELIEF PO), and ANY OVER THE COUNTER supplements until after surgery. ? ?You may however, continue to take Tylenol if needed for pain up until the day of surgery. ? ?No Alcohol for 24 hours before or after surgery. ? ?No Smoking including e-cigarettes for 24 hours prior to surgery.  ?No chewable tobacco products for at least 6 hours prior to surgery.  ?No nicotine patches on the day of surgery. ? ?Do not use any "recreational" drugs for at least a week prior to your surgery.  ?Please be advised that the combination of cocaine and anesthesia may have negative outcomes, up to and including death. ?If you test positive for cocaine, your surgery will be cancelled. ? ?On the morning of surgery brush your teeth with toothpaste and water, you may rinse your mouth with mouthwash if you wish. ?Do not swallow any toothpaste or mouthwash. ? ?Use CHG Soap as directed on instruction sheet. ? ?Do not wear jewelry, make-up, hairpins, clips or nail polish. ? ?Do not wear lotions, powders, or perfumes.  ? ?Do not shave body from the neck down 48 hours prior to surgery just in case you cut yourself which could leave a site for infection.  ?Also, freshly shaved skin may become irritated if using the CHG soap. ? ?dentures may not be worn into surgery. ? ?Do not bring valuables to the hospital. [facility] is not responsible for  any missing/lost belongings or valuables.  ? ?Notify your doctor if there is any change in your medical condition (cold, fever, infection). ? ?Wear comfortable clothing (specific to your surgery type) to the hospital. ? ?After surgery, you can help prevent lung complications by doing breathing exercises.  ?Take deep breaths and cough every 1-2 hours. Your doctor may order a device called an  Incentive Spirometer to help you take deep breaths. ? ?If you are being admitted to the hospital overnight, leave your suitcase in the car. ?After surgery it may be brought to your room. ? ?If you are taking public transportation, you will need to have a responsible adult (18 years or older) with you. ?Please confirm with your physician that it is acceptable to use public transportation.  ? ?Please call the Pre-admissions Testing Dept. at [phone] if you have any questions about these instructions. ? ?Surgery Visitation Policy: ? ?Patients undergoing a surgery or procedure may have two family members or support persons with them as long as the person is not COVID-19 positive or experiencing its symptoms.  ? ?Inpatient Visitation:   ? ?Visiting hours are 7 a.m. to 8 p.m. ?Up to four visitors are allowed at one time in a patient room, including children. The visitors may rotate out with other people during the day. One designated support person (adult) may remain overnight.   ?

## 2022-02-07 LAB — SURGICAL PCR SCREEN
MRSA, PCR: NEGATIVE
Staphylococcus aureus: NEGATIVE

## 2022-02-16 ENCOUNTER — Observation Stay: Payer: Medicare Other

## 2022-02-16 ENCOUNTER — Ambulatory Visit: Payer: Medicare Other | Admitting: Urgent Care

## 2022-02-16 ENCOUNTER — Inpatient Hospital Stay
Admission: RE | Admit: 2022-02-16 | Discharge: 2022-02-21 | DRG: 470 | Disposition: A | Discharge status: Home Health | Payer: Medicare Other | Attending: Orthopedic Surgery | Admitting: Orthopedic Surgery

## 2022-02-16 ENCOUNTER — Encounter
Admission: RE | Disposition: A | Discharge status: Home Health | Payer: Self-pay | Source: Home / Self Care | Attending: Orthopedic Surgery

## 2022-02-16 ENCOUNTER — Other Ambulatory Visit: Payer: Self-pay

## 2022-02-16 ENCOUNTER — Encounter: Payer: Self-pay | Admitting: Orthopedic Surgery

## 2022-02-16 DIAGNOSIS — M797 Fibromyalgia: Secondary | ICD-10-CM | POA: Diagnosis not present

## 2022-02-16 DIAGNOSIS — I6521 Occlusion and stenosis of right carotid artery: Secondary | ICD-10-CM | POA: Diagnosis not present

## 2022-02-16 DIAGNOSIS — Z96652 Presence of left artificial knee joint: Secondary | ICD-10-CM | POA: Diagnosis not present

## 2022-02-16 DIAGNOSIS — N3281 Overactive bladder: Secondary | ICD-10-CM | POA: Diagnosis not present

## 2022-02-16 DIAGNOSIS — Z888 Allergy status to other drugs, medicaments and biological substances status: Secondary | ICD-10-CM | POA: Diagnosis not present

## 2022-02-16 DIAGNOSIS — I10 Essential (primary) hypertension: Secondary | ICD-10-CM | POA: Diagnosis present

## 2022-02-16 DIAGNOSIS — E785 Hyperlipidemia, unspecified: Secondary | ICD-10-CM | POA: Diagnosis present

## 2022-02-16 DIAGNOSIS — Z972 Presence of dental prosthetic device (complete) (partial): Secondary | ICD-10-CM

## 2022-02-16 DIAGNOSIS — I69359 Hemiplegia and hemiparesis following cerebral infarction affecting unspecified side: Secondary | ICD-10-CM | POA: Diagnosis not present

## 2022-02-16 DIAGNOSIS — R262 Difficulty in walking, not elsewhere classified: Secondary | ICD-10-CM | POA: Diagnosis not present

## 2022-02-16 DIAGNOSIS — Z9884 Bariatric surgery status: Secondary | ICD-10-CM | POA: Diagnosis not present

## 2022-02-16 DIAGNOSIS — Z8249 Family history of ischemic heart disease and other diseases of the circulatory system: Secondary | ICD-10-CM | POA: Diagnosis not present

## 2022-02-16 DIAGNOSIS — R739 Hyperglycemia, unspecified: Secondary | ICD-10-CM | POA: Diagnosis not present

## 2022-02-16 DIAGNOSIS — R7303 Prediabetes: Secondary | ICD-10-CM | POA: Diagnosis not present

## 2022-02-16 DIAGNOSIS — Z881 Allergy status to other antibiotic agents status: Secondary | ICD-10-CM

## 2022-02-16 DIAGNOSIS — Z8701 Personal history of pneumonia (recurrent): Secondary | ICD-10-CM

## 2022-02-16 DIAGNOSIS — M1612 Unilateral primary osteoarthritis, left hip: Secondary | ICD-10-CM | POA: Diagnosis not present

## 2022-02-16 DIAGNOSIS — M6281 Muscle weakness (generalized): Secondary | ICD-10-CM | POA: Diagnosis not present

## 2022-02-16 DIAGNOSIS — T8484XD Pain due to internal orthopedic prosthetic devices, implants and grafts, subsequent encounter: Secondary | ICD-10-CM | POA: Diagnosis not present

## 2022-02-16 DIAGNOSIS — M1712 Unilateral primary osteoarthritis, left knee: Principal | ICD-10-CM | POA: Diagnosis present

## 2022-02-16 DIAGNOSIS — M7989 Other specified soft tissue disorders: Secondary | ICD-10-CM | POA: Diagnosis not present

## 2022-02-16 DIAGNOSIS — F32A Depression, unspecified: Secondary | ICD-10-CM | POA: Diagnosis not present

## 2022-02-16 DIAGNOSIS — I959 Hypotension, unspecified: Secondary | ICD-10-CM | POA: Diagnosis not present

## 2022-02-16 DIAGNOSIS — Z7401 Bed confinement status: Secondary | ICD-10-CM | POA: Diagnosis not present

## 2022-02-16 DIAGNOSIS — Z96651 Presence of right artificial knee joint: Secondary | ICD-10-CM | POA: Diagnosis not present

## 2022-02-16 DIAGNOSIS — K5900 Constipation, unspecified: Secondary | ICD-10-CM | POA: Diagnosis not present

## 2022-02-16 DIAGNOSIS — Z880 Allergy status to penicillin: Secondary | ICD-10-CM | POA: Diagnosis not present

## 2022-02-16 DIAGNOSIS — Z8673 Personal history of transient ischemic attack (TIA), and cerebral infarction without residual deficits: Secondary | ICD-10-CM

## 2022-02-16 DIAGNOSIS — K219 Gastro-esophageal reflux disease without esophagitis: Secondary | ICD-10-CM | POA: Diagnosis present

## 2022-02-16 DIAGNOSIS — Z96653 Presence of artificial knee joint, bilateral: Secondary | ICD-10-CM

## 2022-02-16 DIAGNOSIS — N179 Acute kidney failure, unspecified: Secondary | ICD-10-CM | POA: Diagnosis not present

## 2022-02-16 DIAGNOSIS — G2581 Restless legs syndrome: Secondary | ICD-10-CM | POA: Diagnosis not present

## 2022-02-16 DIAGNOSIS — Z471 Aftercare following joint replacement surgery: Secondary | ICD-10-CM | POA: Diagnosis not present

## 2022-02-16 DIAGNOSIS — Z96642 Presence of left artificial hip joint: Secondary | ICD-10-CM | POA: Diagnosis not present

## 2022-02-16 DIAGNOSIS — Z8261 Family history of arthritis: Secondary | ICD-10-CM

## 2022-02-16 DIAGNOSIS — R6889 Other general symptoms and signs: Secondary | ICD-10-CM | POA: Diagnosis not present

## 2022-02-16 DIAGNOSIS — Z4789 Encounter for other orthopedic aftercare: Secondary | ICD-10-CM | POA: Diagnosis not present

## 2022-02-16 HISTORY — PX: TOTAL KNEE ARTHROPLASTY: SHX125

## 2022-02-16 SURGERY — ARTHROPLASTY, KNEE, TOTAL
Anesthesia: Spinal | Site: Knee | Laterality: Left

## 2022-02-16 MED ORDER — FENTANYL CITRATE (PF) 100 MCG/2ML IJ SOLN
INTRAMUSCULAR | Status: AC
  Administered 2022-02-16: 50 ug via INTRAVENOUS
  Filled 2022-02-16: qty 2

## 2022-02-16 MED ORDER — ACETAMINOPHEN 10 MG/ML IV SOLN
INTRAVENOUS | Status: DC | PRN
  Administered 2022-02-16: 1000 mg via INTRAVENOUS

## 2022-02-16 MED ORDER — VIBEGRON 75 MG PO TABS: 1.0000 | ORAL_TABLET | Freq: Every day | ORAL | Status: DC

## 2022-02-16 MED ORDER — CEFAZOLIN SODIUM-DEXTROSE 2-4 GM/100ML-% IV SOLN
INTRAVENOUS | Status: AC
  Filled 2022-02-16: qty 100

## 2022-02-16 MED ORDER — ZOLPIDEM TARTRATE 5 MG PO TABS
5.0000 mg | ORAL_TABLET | Freq: Every evening | ORAL | Status: DC | PRN
  Administered 2022-02-16 – 2022-02-19 (×4): 5 mg via ORAL
  Filled 2022-02-16 (×3): qty 1

## 2022-02-16 MED ORDER — ATORVASTATIN CALCIUM 20 MG PO TABS
80.0000 mg | ORAL_TABLET | Freq: Every day | ORAL | Status: DC
  Administered 2022-02-17 – 2022-02-21 (×5): 80 mg via ORAL
  Filled 2022-02-16: qty 1
  Filled 2022-02-16 (×4): qty 4

## 2022-02-16 MED ORDER — METHOCARBAMOL 500 MG PO TABS
500.0000 mg | ORAL_TABLET | Freq: Four times a day (QID) | ORAL | Status: DC | PRN
  Administered 2022-02-17 – 2022-02-18 (×4): 500 mg via ORAL
  Filled 2022-02-16 (×2): qty 1

## 2022-02-16 MED ORDER — VORTIOXETINE HBR 5 MG PO TABS
5.0000 mg | ORAL_TABLET | Freq: Every day | ORAL | Status: DC
  Administered 2022-02-17 – 2022-02-21 (×5): 5 mg via ORAL
  Filled 2022-02-16 (×5): qty 1

## 2022-02-16 MED ORDER — POLYETHYLENE GLYCOL 3350 17 G PO PACK: 17.0000 g | PACK | Freq: Every day | ORAL | Status: DC | PRN

## 2022-02-16 MED ORDER — LISINOPRIL-HYDROCHLOROTHIAZIDE 20-25 MG PO TABS: 1.0000 | ORAL_TABLET | Freq: Every day | ORAL | Status: DC

## 2022-02-16 MED ORDER — MORPHINE SULFATE (PF) 10 MG/ML IV SOLN
INTRAVENOUS | Status: AC
  Filled 2022-02-16: qty 1

## 2022-02-16 MED ORDER — EPHEDRINE 5 MG/ML INJ
INTRAVENOUS | Status: AC
Start: 2022-02-16 — End: ?
  Filled 2022-02-16: qty 5

## 2022-02-16 MED ORDER — MENTHOL 3 MG MT LOZG: 1.0000 | LOZENGE | OROMUCOSAL | Status: DC | PRN

## 2022-02-16 MED ORDER — DULOXETINE HCL 30 MG PO CPEP
60.0000 mg | ORAL_CAPSULE | Freq: Two times a day (BID) | ORAL | Status: DC
  Administered 2022-02-16 – 2022-02-21 (×10): 60 mg via ORAL
  Filled 2022-02-16: qty 2
  Filled 2022-02-16: qty 1
  Filled 2022-02-16 (×5): qty 2
  Filled 2022-02-16: qty 1
  Filled 2022-02-16 (×2): qty 2

## 2022-02-16 MED ORDER — TRAMADOL HCL 50 MG PO TABS
ORAL_TABLET | ORAL | Status: AC
  Filled 2022-02-16: qty 1

## 2022-02-16 MED ORDER — METOCLOPRAMIDE HCL 10 MG PO TABS
5.0000 mg | ORAL_TABLET | Freq: Three times a day (TID) | ORAL | Status: DC | PRN
  Filled 2022-02-16: qty 1

## 2022-02-16 MED ORDER — HYDROMORPHONE HCL 1 MG/ML IJ SOLN
INTRAMUSCULAR | Status: AC
  Filled 2022-02-16: qty 0.5

## 2022-02-16 MED ORDER — ONDANSETRON HCL 4 MG/2ML IJ SOLN: 4.0000 mg | Freq: Once | INTRAMUSCULAR | Status: DC | PRN

## 2022-02-16 MED ORDER — PROPOFOL 500 MG/50ML IV EMUL
INTRAVENOUS | Status: DC | PRN
  Administered 2022-02-16: 100 ug/kg/min via INTRAVENOUS

## 2022-02-16 MED ORDER — LISINOPRIL 20 MG PO TABS
20.0000 mg | ORAL_TABLET | Freq: Every day | ORAL | Status: DC
  Administered 2022-02-16 – 2022-02-19 (×4): 20 mg via ORAL
  Filled 2022-02-16 (×4): qty 1

## 2022-02-16 MED ORDER — FENTANYL CITRATE (PF) 100 MCG/2ML IJ SOLN
INTRAMUSCULAR | Status: AC
  Filled 2022-02-16: qty 2

## 2022-02-16 MED ORDER — MULTI FOR HER 50+ PO TABS: ORAL_TABLET | Freq: Every day | ORAL | Status: DC

## 2022-02-16 MED ORDER — BUPIVACAINE LIPOSOME 1.3 % IJ SUSP
INTRAMUSCULAR | Status: AC
  Filled 2022-02-16: qty 40

## 2022-02-16 MED ORDER — PROPOFOL 1000 MG/100ML IV EMUL
INTRAVENOUS | Status: AC
  Filled 2022-02-16: qty 100

## 2022-02-16 MED ORDER — CHLORHEXIDINE GLUCONATE 0.12 % MT SOLN: 15.0000 mL | Freq: Once | OROMUCOSAL | Status: AC

## 2022-02-16 MED ORDER — PHENYLEPHRINE HCL-NACL 20-0.9 MG/250ML-% IV SOLN
INTRAVENOUS | Status: AC
  Filled 2022-02-16: qty 250

## 2022-02-16 MED ORDER — SURGIPHOR WOUND IRRIGATION SYSTEM - OPTIME
TOPICAL | Status: DC | PRN
Start: 2022-02-16 — End: 2022-02-16

## 2022-02-16 MED ORDER — HYDROMORPHONE HCL 1 MG/ML IJ SOLN
0.5000 mg | INTRAMUSCULAR | Status: DC | PRN
  Administered 2022-02-16 – 2022-02-17 (×3): 0.5 mg via INTRAVENOUS
  Administered 2022-02-17 – 2022-02-19 (×4): 1 mg via INTRAVENOUS
  Filled 2022-02-16 (×4): qty 1

## 2022-02-16 MED ORDER — ONDANSETRON HCL 4 MG PO TABS: 4.0000 mg | ORAL_TABLET | Freq: Four times a day (QID) | ORAL | Status: DC | PRN

## 2022-02-16 MED ORDER — PANTOPRAZOLE SODIUM 40 MG PO TBEC
40.0000 mg | DELAYED_RELEASE_TABLET | Freq: Every day | ORAL | Status: DC
  Administered 2022-02-18 – 2022-02-21 (×4): 40 mg via ORAL
  Filled 2022-02-16 (×4): qty 1

## 2022-02-16 MED ORDER — HYDROCHLOROTHIAZIDE 25 MG PO TABS
25.0000 mg | ORAL_TABLET | Freq: Every day | ORAL | Status: DC
  Administered 2022-02-16 – 2022-02-19 (×4): 25 mg via ORAL
  Filled 2022-02-16 (×4): qty 1

## 2022-02-16 MED ORDER — PROPOFOL 10 MG/ML IV BOLUS
INTRAVENOUS | Status: DC | PRN
  Administered 2022-02-16 (×2): 10 mg via INTRAVENOUS

## 2022-02-16 MED ORDER — OXYCODONE HCL 5 MG PO TABS
ORAL_TABLET | ORAL | Status: AC
Start: 2022-02-16 — End: 2022-02-17
  Filled 2022-02-16: qty 2

## 2022-02-16 MED ORDER — CEFAZOLIN SODIUM-DEXTROSE 2-4 GM/100ML-% IV SOLN
2.0000 g | Freq: Four times a day (QID) | INTRAVENOUS | Status: AC
  Administered 2022-02-16: 2 g via INTRAVENOUS

## 2022-02-16 MED ORDER — OXYCODONE HCL 5 MG PO TABS
10.0000 mg | ORAL_TABLET | ORAL | Status: DC | PRN
  Administered 2022-02-17 – 2022-02-20 (×8): 10 mg via ORAL
  Filled 2022-02-16 (×2): qty 2

## 2022-02-16 MED ORDER — SODIUM CHLORIDE FLUSH 0.9 % IV SOLN
INTRAVENOUS | Status: AC
  Filled 2022-02-16: qty 80

## 2022-02-16 MED ORDER — BUPIVACAINE HCL (PF) 0.5 % IJ SOLN
INTRAMUSCULAR | Status: DC | PRN
  Administered 2022-02-16: 2.4 mL via INTRATHECAL

## 2022-02-16 MED ORDER — FLEET ENEMA 7-19 GM/118ML RE ENEM: 1.0000 | ENEMA | Freq: Once | RECTAL | Status: DC | PRN

## 2022-02-16 MED ORDER — DOCUSATE SODIUM 100 MG PO CAPS
ORAL_CAPSULE | ORAL | Status: AC
  Filled 2022-02-16: qty 1

## 2022-02-16 MED ORDER — OXYCODONE HCL 5 MG PO TABS
5.0000 mg | ORAL_TABLET | ORAL | Status: DC | PRN
  Administered 2022-02-16: 10 mg via ORAL
  Administered 2022-02-17 – 2022-02-19 (×2): 5 mg via ORAL
  Administered 2022-02-21 (×2): 10 mg via ORAL
  Filled 2022-02-16 (×3): qty 2
  Filled 2022-02-16: qty 1
  Filled 2022-02-16 (×4): qty 2

## 2022-02-16 MED ORDER — ALUM & MAG HYDROXIDE-SIMETH 200-200-20 MG/5ML PO SUSP
ORAL | Status: AC
  Filled 2022-02-16: qty 30

## 2022-02-16 MED ORDER — DOCUSATE SODIUM 100 MG PO CAPS
ORAL_CAPSULE | ORAL | Status: AC
  Administered 2022-02-16: 100 mg via ORAL
  Filled 2022-02-16: qty 1

## 2022-02-16 MED ORDER — OXYCODONE HCL 5 MG PO TABS: 5.0000 mg | ORAL_TABLET | Freq: Once | ORAL | Status: DC | PRN

## 2022-02-16 MED ORDER — FENTANYL CITRATE (PF) 100 MCG/2ML IJ SOLN
INTRAMUSCULAR | Status: AC
  Administered 2022-02-16: 25 ug via INTRAVENOUS
  Filled 2022-02-16: qty 2

## 2022-02-16 MED ORDER — CLOPIDOGREL BISULFATE 75 MG PO TABS
75.0000 mg | ORAL_TABLET | Freq: Every day | ORAL | Status: DC
  Administered 2022-02-16 – 2022-02-21 (×6): 75 mg via ORAL
  Filled 2022-02-16 (×6): qty 1

## 2022-02-16 MED ORDER — OXYCODONE HCL 5 MG PO TABS
ORAL_TABLET | ORAL | Status: AC
  Administered 2022-02-16: 10 mg via ORAL
  Filled 2022-02-16: qty 2

## 2022-02-16 MED ORDER — CLONIDINE HCL 0.1 MG PO TABS
0.1000 mg | ORAL_TABLET | Freq: Every day | ORAL | Status: DC
  Administered 2022-02-16 – 2022-02-20 (×5): 0.1 mg via ORAL
  Filled 2022-02-16 (×5): qty 1

## 2022-02-16 MED ORDER — PHENYLEPHRINE HCL (PRESSORS) 10 MG/ML IV SOLN
INTRAVENOUS | Status: DC | PRN
  Administered 2022-02-16 (×2): 200 ug via INTRAVENOUS
  Administered 2022-02-16: 160 ug via INTRAVENOUS

## 2022-02-16 MED ORDER — FENTANYL CITRATE (PF) 100 MCG/2ML IJ SOLN
25.0000 ug | INTRAMUSCULAR | Status: DC | PRN
  Administered 2022-02-16 (×3): 25 ug via INTRAVENOUS

## 2022-02-16 MED ORDER — BISACODYL 5 MG PO TBEC
5.0000 mg | DELAYED_RELEASE_TABLET | Freq: Every day | ORAL | Status: DC | PRN
  Filled 2022-02-16: qty 1

## 2022-02-16 MED ORDER — MIDAZOLAM HCL 2 MG/2ML IJ SOLN
INTRAMUSCULAR | Status: AC
  Filled 2022-02-16: qty 2

## 2022-02-16 MED ORDER — DEXAMETHASONE SODIUM PHOSPHATE 10 MG/ML IJ SOLN
INTRAMUSCULAR | Status: AC
  Filled 2022-02-16: qty 1

## 2022-02-16 MED ORDER — ACETAMINOPHEN 10 MG/ML IV SOLN: 1000.0000 mg | Freq: Once | INTRAVENOUS | Status: DC | PRN

## 2022-02-16 MED ORDER — DOCUSATE SODIUM 100 MG PO CAPS
100.0000 mg | ORAL_CAPSULE | Freq: Two times a day (BID) | ORAL | Status: DC
  Administered 2022-02-16 – 2022-02-21 (×9): 100 mg via ORAL
  Filled 2022-02-16 (×8): qty 1

## 2022-02-16 MED ORDER — BACLOFEN 10 MG PO TABS: 5.0000 mg | ORAL_TABLET | Freq: Every evening | ORAL | Status: DC | PRN

## 2022-02-16 MED ORDER — TRAMADOL HCL 50 MG PO TABS
50.0000 mg | ORAL_TABLET | Freq: Four times a day (QID) | ORAL | Status: DC
  Administered 2022-02-16 – 2022-02-21 (×17): 50 mg via ORAL
  Filled 2022-02-16 (×14): qty 1

## 2022-02-16 MED ORDER — LACTATED RINGERS IV SOLN: INTRAVENOUS | Status: DC

## 2022-02-16 MED ORDER — OMEGA-3-ACID ETHYL ESTERS 1 G PO CAPS
1000.0000 mg | ORAL_CAPSULE | Freq: Every day | ORAL | Status: DC
  Administered 2022-02-16 – 2022-02-21 (×6): 1000 mg via ORAL
  Filled 2022-02-16 (×6): qty 1

## 2022-02-16 MED ORDER — ACETAMINOPHEN 325 MG PO TABS: 325.0000 mg | ORAL_TABLET | Freq: Four times a day (QID) | ORAL | Status: DC | PRN

## 2022-02-16 MED ORDER — SODIUM CHLORIDE 0.9 % IV SOLN: INTRAVENOUS | Status: DC

## 2022-02-16 MED ORDER — ASPIRIN 81 MG PO CHEW
81.0000 mg | CHEWABLE_TABLET | Freq: Two times a day (BID) | ORAL | Status: DC
  Administered 2022-02-16 – 2022-02-21 (×9): 81 mg via ORAL
  Filled 2022-02-16 (×8): qty 1

## 2022-02-16 MED ORDER — AMLODIPINE BESYLATE 5 MG PO TABS
5.0000 mg | ORAL_TABLET | Freq: Every day | ORAL | Status: DC
  Administered 2022-02-16 – 2022-02-21 (×6): 5 mg via ORAL
  Filled 2022-02-16 (×6): qty 1

## 2022-02-16 MED ORDER — MIDAZOLAM HCL 5 MG/5ML IJ SOLN
INTRAMUSCULAR | Status: DC | PRN
Start: 2022-02-16 — End: 2022-02-16
  Administered 2022-02-16: 2 mg via INTRAVENOUS

## 2022-02-16 MED ORDER — METOCLOPRAMIDE HCL 5 MG/ML IJ SOLN: 5.0000 mg | Freq: Three times a day (TID) | INTRAMUSCULAR | Status: DC | PRN

## 2022-02-16 MED ORDER — METHOCARBAMOL 500 MG PO TABS
ORAL_TABLET | ORAL | Status: AC
  Administered 2022-02-16: 500 mg via ORAL
  Filled 2022-02-16: qty 1

## 2022-02-16 MED ORDER — PHENOL 1.4 % MT LIQD: 1.0000 | OROMUCOSAL | Status: DC | PRN

## 2022-02-16 MED ORDER — OXYCODONE HCL 5 MG/5ML PO SOLN: 5.0000 mg | Freq: Once | ORAL | Status: DC | PRN

## 2022-02-16 MED ORDER — ACETAMINOPHEN 10 MG/ML IV SOLN
INTRAVENOUS | Status: AC
  Filled 2022-02-16: qty 100

## 2022-02-16 MED ORDER — ADULT MULTIVITAMIN W/MINERALS CH
1.0000 | ORAL_TABLET | Freq: Every day | ORAL | Status: DC
  Administered 2022-02-16 – 2022-02-21 (×6): 1 via ORAL
  Filled 2022-02-16 (×6): qty 1

## 2022-02-16 MED ORDER — CEFAZOLIN SODIUM-DEXTROSE 2-4 GM/100ML-% IV SOLN
2.0000 g | INTRAVENOUS | Status: AC
  Administered 2022-02-16: 2 g via INTRAVENOUS

## 2022-02-16 MED ORDER — BUPIVACAINE LIPOSOME 1.3 % IJ SUSP
INTRAMUSCULAR | Status: DC | PRN
  Administered 2022-02-16: 91 mL via INTRAMUSCULAR

## 2022-02-16 MED ORDER — ONDANSETRON HCL 4 MG/2ML IJ SOLN
INTRAMUSCULAR | Status: AC
  Filled 2022-02-16: qty 2

## 2022-02-16 MED ORDER — VITAMIN D3 25 MCG (1000 UNIT) PO TABS
5000.0000 [IU] | ORAL_TABLET | Freq: Every day | ORAL | Status: DC
  Administered 2022-02-16 – 2022-02-21 (×6): 5000 [IU] via ORAL
  Filled 2022-02-16 (×10): qty 5

## 2022-02-16 MED ORDER — ZOLPIDEM TARTRATE 5 MG PO TABS
ORAL_TABLET | ORAL | Status: AC
  Filled 2022-02-16: qty 1

## 2022-02-16 MED ORDER — NEOMYCIN-POLYMYXIN B GU 40-200000 IR SOLN
Status: AC
  Filled 2022-02-16: qty 20

## 2022-02-16 MED ORDER — ONDANSETRON HCL 4 MG/2ML IJ SOLN
4.0000 mg | Freq: Four times a day (QID) | INTRAMUSCULAR | Status: DC | PRN
  Filled 2022-02-16: qty 2

## 2022-02-16 MED ORDER — SODIUM CHLORIDE 0.9 % IR SOLN: Status: DC | PRN

## 2022-02-16 MED ORDER — ASPIRIN 81 MG PO CHEW
CHEWABLE_TABLET | ORAL | Status: AC
  Filled 2022-02-16: qty 1

## 2022-02-16 MED ORDER — BUPIVACAINE-EPINEPHRINE (PF) 0.25% -1:200000 IJ SOLN
INTRAMUSCULAR | Status: AC
  Filled 2022-02-16: qty 60

## 2022-02-16 MED ORDER — CHLORHEXIDINE GLUCONATE 0.12 % MT SOLN
OROMUCOSAL | Status: AC
  Administered 2022-02-16: 15 mL via OROMUCOSAL
  Filled 2022-02-16: qty 15

## 2022-02-16 MED ORDER — ORAL CARE MOUTH RINSE: 15.0000 mL | Freq: Once | OROMUCOSAL | Status: AC

## 2022-02-16 MED ORDER — LIDOCAINE HCL (PF) 2 % IJ SOLN
INTRAMUSCULAR | Status: AC
  Filled 2022-02-16: qty 5

## 2022-02-16 MED ORDER — CEFAZOLIN SODIUM-DEXTROSE 2-4 GM/100ML-% IV SOLN
INTRAVENOUS | Status: AC
  Administered 2022-02-16: 2 g via INTRAVENOUS
  Filled 2022-02-16: qty 100

## 2022-02-16 MED ORDER — PIMECROLIMUS 1 % EX CREA: 1.0000 "application " | TOPICAL_CREAM | Freq: Two times a day (BID) | CUTANEOUS | Status: DC

## 2022-02-16 MED ORDER — EPHEDRINE SULFATE (PRESSORS) 50 MG/ML IJ SOLN
INTRAMUSCULAR | Status: DC | PRN
  Administered 2022-02-16: 5 mg via INTRAVENOUS
  Administered 2022-02-16 (×3): 10 mg via INTRAVENOUS

## 2022-02-16 MED ORDER — PHENYLEPHRINE HCL-NACL 20-0.9 MG/250ML-% IV SOLN
INTRAVENOUS | Status: DC | PRN
  Administered 2022-02-16: 25 ug/min via INTRAVENOUS

## 2022-02-16 MED ORDER — TRAMADOL HCL 50 MG PO TABS
ORAL_TABLET | ORAL | Status: AC
  Administered 2022-02-16: 50 mg via ORAL
  Filled 2022-02-16: qty 1

## 2022-02-16 MED ORDER — BUPROPION HCL ER (XL) 150 MG PO TB24
150.0000 mg | ORAL_TABLET | Freq: Every day | ORAL | Status: DC
  Administered 2022-02-17 – 2022-02-21 (×5): 150 mg via ORAL
  Filled 2022-02-16 (×5): qty 1

## 2022-02-16 MED ORDER — ALUM & MAG HYDROXIDE-SIMETH 200-200-20 MG/5ML PO SUSP
30.0000 mL | ORAL | Status: DC | PRN
  Administered 2022-02-16: 30 mL via ORAL

## 2022-02-16 SURGICAL SUPPLY — 73 items
BLADE SAGITTAL 25.0X1.19X90 (BLADE) ×2 IMPLANT
BLADE SAW 90X13X1.19 OSCILLAT (BLADE) ×2 IMPLANT
BLOCK CUTTING FEMUR 2+ LT MED (MISCELLANEOUS) ×1 IMPLANT
BLOCK CUTTING TIBIAL 2 LT (MISCELLANEOUS) ×1 IMPLANT
BNDG ELASTIC 6X5.8 VLCR STR LF (GAUZE/BANDAGES/DRESSINGS) ×2 IMPLANT
CANISTER WOUND CARE 500ML ATS (WOUND CARE) ×2 IMPLANT
CEMENT HV SMART SET (Cement) ×4 IMPLANT
CHLORAPREP W/TINT 26 (MISCELLANEOUS) ×4 IMPLANT
COOLER POLAR GLACIER W/PUMP (MISCELLANEOUS) ×2 IMPLANT
CUFF TOURN SGL QUICK 24 (TOURNIQUET CUFF)
CUFF TOURN SGL QUICK 34 (TOURNIQUET CUFF)
CUFF TRNQT CYL 24X4X16.5-23 (TOURNIQUET CUFF) IMPLANT
CUFF TRNQT CYL 34X4.125X (TOURNIQUET CUFF) IMPLANT
DRAPE 3/4 80X56 (DRAPES) ×4 IMPLANT
DRSG MEPILEX SACRM 8.7X9.8 (GAUZE/BANDAGES/DRESSINGS) ×2 IMPLANT
ELECT CAUTERY BLADE 6.4 (BLADE) ×2 IMPLANT
ELECT REM PT RETURN 9FT ADLT (ELECTROSURGICAL) ×2
ELECTRODE REM PT RTRN 9FT ADLT (ELECTROSURGICAL) ×1 IMPLANT
FEMERAL COMPONENT LEFT SZ2P (Femur) ×1 IMPLANT
FEMUR BONE MODEL 4.9010 MEDACT (MISCELLANEOUS) ×1 IMPLANT
GAUZE SPONGE 4X4 12PLY STRL (GAUZE/BANDAGES/DRESSINGS) ×2 IMPLANT
GAUZE XEROFORM 1X8 LF (GAUZE/BANDAGES/DRESSINGS) ×1 IMPLANT
GLOVE SURG ORTHO 8.0 STRL STRW (GLOVE) ×2 IMPLANT
GLOVE SURG SYN 9.0  PF PI (GLOVE) ×1
GLOVE SURG SYN 9.0 PF PI (GLOVE) ×1 IMPLANT
GLOVE SURG UNDER LTX SZ8 (GLOVE) ×2 IMPLANT
GLOVE SURG UNDER POLY LF SZ9 (GLOVE) ×2 IMPLANT
GOWN SRG 2XL LVL 4 RGLN SLV (GOWNS) ×1 IMPLANT
GOWN STRL NON-REIN 2XL LVL4 (GOWNS) ×1
GOWN STRL REUS W/ TWL LRG LVL3 (GOWN DISPOSABLE) ×1 IMPLANT
GOWN STRL REUS W/ TWL XL LVL3 (GOWN DISPOSABLE) ×1 IMPLANT
GOWN STRL REUS W/TWL LRG LVL3 (GOWN DISPOSABLE) ×1
GOWN STRL REUS W/TWL XL LVL3 (GOWN DISPOSABLE) ×1
HOLDER FOLEY CATH W/STRAP (MISCELLANEOUS) ×2 IMPLANT
HOOD PEEL AWAY FLYTE STAYCOOL (MISCELLANEOUS) ×4 IMPLANT
INSERT TIBIAL FLEX SZ2 LEFT (Insert) ×1 IMPLANT
IV NS IRRIG 3000ML ARTHROMATIC (IV SOLUTION) ×2 IMPLANT
KIT PREVENA INCISION MGT20CM45 (CANNISTER) ×2 IMPLANT
KIT TURNOVER KIT A (KITS) ×2 IMPLANT
MANIFOLD NEPTUNE II (INSTRUMENTS) ×3 IMPLANT
NDL SAFETY ECLIPSE 18X1.5 (NEEDLE) ×1 IMPLANT
NDL SPNL 18GX3.5 QUINCKE PK (NEEDLE) ×1 IMPLANT
NDL SPNL 20GX3.5 QUINCKE YW (NEEDLE) ×1 IMPLANT
NEEDLE HYPO 18GX1.5 SHARP (NEEDLE) ×1
NEEDLE SPNL 18GX3.5 QUINCKE PK (NEEDLE) ×2 IMPLANT
NEEDLE SPNL 20GX3.5 QUINCKE YW (NEEDLE) ×2 IMPLANT
NS IRRIG 1000ML POUR BTL (IV SOLUTION) ×2 IMPLANT
PACK TOTAL KNEE (MISCELLANEOUS) ×2 IMPLANT
PAD WRAPON POLAR KNEE (MISCELLANEOUS) ×1 IMPLANT
PATELLA RESURFACING MEDACTA 02 (Bone Implant) ×1 IMPLANT
PENCIL SMOKE EVACUATOR COATED (MISCELLANEOUS) ×2 IMPLANT
PULSAVAC PLUS IRRIG FAN TIP (DISPOSABLE) ×2
SCALPEL PROTECTED #10 DISP (BLADE) ×4 IMPLANT
SOLUTION IRRIG SURGIPHOR (IV SOLUTION) ×1 IMPLANT
SOLUTION PRONTOSAN WOUND 350ML (IRRIGATION / IRRIGATOR) ×1 IMPLANT
STAPLER SKIN PROX 35W (STAPLE) ×2 IMPLANT
STEM EXTENSION 11MMX30MM (Stem) ×1 IMPLANT
SUCTION FRAZIER HANDLE 10FR (MISCELLANEOUS) ×1
SUCTION TUBE FRAZIER 10FR DISP (MISCELLANEOUS) ×1 IMPLANT
SUT DVC 2 QUILL PDO  T11 36X36 (SUTURE) ×1
SUT DVC 2 QUILL PDO T11 36X36 (SUTURE) ×1 IMPLANT
SUT ETHIBOND 2 V 37 (SUTURE) ×1 IMPLANT
SUT V-LOC 90 ABS DVC 3-0 CL (SUTURE) ×2 IMPLANT
SYR 20ML LL LF (SYRINGE) ×2 IMPLANT
SYR 50ML LL SCALE MARK (SYRINGE) ×4 IMPLANT
TIBIAL BONE MODEL LEFT (MISCELLANEOUS) ×1 IMPLANT
TIBIAL TRAY FIXED MEDACTA 0207 (Joint) ×1 IMPLANT
TIP FAN IRRIG PULSAVAC PLUS (DISPOSABLE) ×1 IMPLANT
TOWEL OR 17X26 4PK STRL BLUE (TOWEL DISPOSABLE) ×1 IMPLANT
TOWER CARTRIDGE SMART MIX (DISPOSABLE) ×2 IMPLANT
TRAY FOLEY MTR SLVR 16FR STAT (SET/KITS/TRAYS/PACK) ×2 IMPLANT
WATER STERILE IRR 1000ML POUR (IV SOLUTION) ×2 IMPLANT
WRAPON POLAR PAD KNEE (MISCELLANEOUS) ×2

## 2022-02-16 NOTE — Transfer of Care (Signed)
Immediate Anesthesia Transfer of Care Note ? ?Patient: Terri Wood ? ?Procedure(s) Performed: TOTAL KNEE ARTHROPLASTY (Left: Knee) ? ?Patient Location: PACU ? ?Anesthesia Type:General and Spinal ? ?Level of Consciousness: drowsy and patient cooperative ? ?Airway & Oxygen Therapy: Patient Spontanous Breathing and Patient connected to face mask oxygen ? ?Post-op Assessment: Report given to RN and Post -op Vital signs reviewed and stable ? ?Post vital signs: Reviewed and stable ? ?Last Vitals:  ?Vitals Value Taken Time  ?BP 87/50 02/16/22 1045  ?Temp    ?Pulse 81 02/16/22 1046  ?Resp 18 02/16/22 1046  ?SpO2 93 % 02/16/22 1046  ?Vitals shown include unvalidated device data. ? ?Last Pain:  ?Vitals:  ? 02/16/22 0749  ?TempSrc: Tympanic  ?PainSc: 6   ?   ? ?  ? ?Complications: No notable events documented. ?

## 2022-02-16 NOTE — Anesthesia Procedure Notes (Signed)
Spinal ? ?Patient location during procedure: OR ?Start time: 02/16/2022 8:44 AM ?End time: 02/16/2022 8:46 AM ?Reason for block: surgical anesthesia ?Staffing ?Performed: resident/CRNA  ?Resident/CRNA: Lia Foyer, CRNA ?Preanesthetic Checklist ?Completed: patient identified, IV checked, site marked, risks and benefits discussed, surgical consent, monitors and equipment checked, pre-op evaluation and timeout performed ?Spinal Block ?Patient position: sitting ?Prep: DuraPrep ?Patient monitoring: heart rate, cardiac monitor, continuous pulse ox and blood pressure ?Approach: midline ?Location: L3-4 ?Injection technique: single-shot ?Needle ?Needle type: Pencan  ?Needle gauge: 24 G ?Needle length: 9 cm ?Assessment ?Sensory level: T4 ?Events: CSF return ? ? ? ?

## 2022-02-16 NOTE — Evaluation (Signed)
Physical Therapy Evaluation ?Patient Details ?Name: Terri Wood J Brusseau ?MRN: [id number] ?DOB: 11/10/1965 ?Today's Date: 02/16/2022 ? ?History of Present Illness ? Pt is a 56 y.o. 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  ?Clinical Impression ? Prior to surgery, pt was independent with ambulation (occasional use of SPC); lives with her adopted mom in 1 level home with ramp to enter.  Currently pt is min assist semi-supine to sitting edge of bed; min to mod assist with transfers using RW; and min assist to ambulate a few feet bed to recliner with RW use.  Pt reporting 9/10 L knee pain at rest beginning of session (nurse gave pt IV pain meds beginning of session) and L knee pain 7-8/10 at rest end of session; limited ambulation d/t c/o L knee pain.  Pt would benefit from skilled PT to address noted impairments and functional limitations (see below for any additional details).  Upon hospital discharge, pt may benefit from SNF (if pt's pain improves, anticipate pt may be able to progress to discharging home with HHPT instead).   ? ?Recommendations for follow up therapy are one component of a multi-disciplinary discharge planning process, led by the attending physician.  Recommendations may be updated based on patient status, additional functional criteria and insurance authorization. ? ?Follow Up Recommendations Skilled nursing-short term rehab (<3 hours/day) ? ?  ?Assistance Recommended at Discharge Frequent or constant Supervision/Assistance  ?Patient can return home with the following ? A little help with bathing/dressing/bathroom;Assistance with cooking/housework;A lot of help with walking and/or transfers;Assist for transportation;Help with stairs or ramp for entrance ? ?  ?Equipment Recommendations  None recommended by PT (Pt had RW and BSC at home already)  ?Recommendations for Other Services ? OT consult  ?  ?Functional Status Assessment Patient has had a recent decline in their functional status and demonstrates the ability to make significant improvements in function in a reasonable and predictable amount of time.  ? ?  ?Precautions / Restrictions Precautions ?Precautions: Fall;Knee ?Precaution Booklet Issued: Yes (comment) ?Restrictions ?Weight Bearing Restrictions: Yes ?LLE Weight Bearing: Weight bearing as tolerated  ? ?  ? ?Mobility ? Bed Mobility ?Overal bed mobility: Needs Assistance ?Bed Mobility: Supine to Sit ?  ?  ?Supine to sit: Min assist, HOB elevated ?  ?  ?General bed mobility comments: assist for L LE; use of bed rail; increased effort/time to perform ?  ? ?Transfers ?Overall transfer level: Needs assistance ?Equipment used: Rolling walker (2 wheels) ?Transfers: Sit to/from Stand ?Sit to Stand: Min assist, Mod assist ?  ?  ?  ?  ?  ?General transfer comment: vc's for UE/LE placement; assist to initiate stand up to RW and control descent sitting ?  ? ?Ambulation/Gait ?Ambulation/Gait assistance: Min assist ?Gait Distance (Feet): 3 Feet (bed to recliner) ?Assistive device: Rolling walker (2 wheels) ?  ?Gait velocity: decreased ?  ?  ?General Gait Details: antalgic; decreased stance time L LE; vc's to increase UE support through RW to offweight L LE; pt started to pivot a little on R LE (d/t L knee pain taking steps) but with cueing pt able to finish taking steps towards recliner ? ?Stairs ?  ?  ?  ?  ?  ? ?Wheelchair Mobility ?  ? ?Modified Rankin (Stroke Patients Only) ?  ? ?  ? ?  Balance Overall balance assessment: Needs assistance ?Sitting-balance support: No upper extremity supported, Feet supported ?Sitting balance-Leahy Scale: Good ?Sitting balance - Comments: steady sitting reaching within BOS ?  ?Standing balance support: Bilateral upper extremity supported, Reliant on assistive device  for balance ?Standing balance-Leahy Scale: Fair ?Standing balance comment: steady static standing with B UE support on RW ?  ?  ?  ?  ?  ?  ?  ?  ?  ?  ?  ?   ? ? ? ?Pertinent Vitals/Pain Pain Assessment ?Pain Assessment: 0-10 ?Pain Score: 8  ?Pain Location: L knee ?Pain Descriptors / Indicators: Aching, Tender, Sore, Guarding ?Pain Intervention(s): Limited activity within patient's tolerance, Monitored during session, Premedicated before session, Repositioned, Patient requesting pain meds-RN notified, RN gave pain meds during session, Other (comment) (polar care applied and activated) ?Vitals (HR and O2 on 2 L via nasal cannula) stable and WFL throughout treatment session.  ? ? ?Home Living Family/patient expects to be discharged to:: Private residence ?Living Arrangements: Parent (pt reports her adopted mom) ?  ?Type of Home: House ?Home Access: Ramped entrance ?  ?  ?  ?Home Layout: One level ?Home Equipment: Grab bars - tub/shower;Rolling Walker (2 wheels);Rollator (4 wheels);Cane - single point;BSC/3in1;Shower seat ?   ?  ?Prior Function Prior Level of Function : Independent/Modified Independent ?  ?  ?  ?  ?  ?  ?Mobility Comments: Independent with ambulation; occasional use of cane; no recent falls ?  ?  ? ? ?Hand Dominance  ?   ? ?  ?Extremity/Trunk Assessment  ? Upper Extremity Assessment ?Upper Extremity Assessment:  (Decreased L hand grip strength) ?  ? ?Lower Extremity Assessment ?Lower Extremity Assessment: LLE deficits/detail ?LLE Deficits / Details: at least 3/5 ankle DF/PF AROM; fair L quad set isometric strength; at least 3-/5 hip flexion (limited d/t L knee pain) ?LLE: Unable to fully assess due to pain ?  ? ?Cervical / Trunk Assessment ?Cervical / Trunk Assessment: Other exceptions ?Cervical / Trunk Exceptions: forward head/shoulders  ?Communication  ? Communication: No difficulties  ?Cognition Arousal/Alertness: Awake/alert ?Behavior During Therapy: [facility] for tasks assessed/performed ?Overall  Cognitive Status: Within Functional Limits for tasks assessed ?  ?  ?  ?  ?  ?  ?  ?  ?  ?  ?  ?  ?  ?  ?  ?  ?  ?  ?  ? ?  ?General Comments General comments (skin integrity, edema, etc.): wound vac in place.  Nursing cleared pt for participation in physical therapy.  Pt agreeable to PT session. ? ?  ?Exercises Total Joint Exercises ?Ankle Circles/Pumps: AROM, Strengthening, Both, 10 reps, Supine ?Quad Sets: AROM, Strengthening, Left, 10 reps, Supine ?Heel Slides: AAROM, Strengthening, Left, 10 reps, Supine (limited ROM d/t L knee pain) ?Hip ABduction/ADduction: AAROM, Strengthening, Left, 10 reps, Supine ?Straight Leg Raises: AAROM, Strengthening, Left, 10 reps, Supine ?Goniometric ROM: L knee AROM 10- grossly 60 degrees (limited d/t L knee pain)  ? ?Assessment/Plan  ?  ?PT Assessment Patient needs continued PT services  ?PT Problem List Decreased strength;Decreased range of motion;Decreased activity tolerance;Decreased balance;Decreased mobility;Decreased knowledge of precautions;Pain;Decreased skin integrity ? ?   ?  ?PT Treatment Interventions DME instruction;Gait training;Functional mobility training;Therapeutic activities;Therapeutic exercise;Balance training;Patient/family education   ? ?PT Goals (Current goals can be found in the Care Plan section)  ?Acute Rehab PT Goals ?Patient Stated Goal: to improve pain and mobility ?PT Goal Formulation: With patient ?Time For Goal Achievement: 03/02/22 ?Potential to  Achieve Goals: Good ? ?  ?Frequency BID ?  ? ? ?Co-evaluation   ?  ?  ?  ?  ? ? ?  ?AM-PAC PT "6 Clicks" Mobility  ?Outcome Measure Help needed turning from your back to your side while in a flat bed without using bedrails?: A Little ?Help needed moving from lying on your back to sitting on the side of a flat bed without using bedrails?: A Little ?Help needed moving to and from a bed to a chair (including a wheelchair)?: A Little ?Help needed standing up from a chair using your arms (e.g., wheelchair or  bedside chair)?: A Little ?Help needed to walk in hospital room?: Total ?Help needed climbing 3-5 steps with a railing? : Total ?6 Click Score: 14 ? ?  ?End of Session Equipment Utilized During Treatment: G

## 2022-02-16 NOTE — H&P (Signed)
?Chief Complaint  ?Patient presents with  ? Pre-op Exam  ?Scheduled for Left TKA 02/16/22 with Dr. Rosita Kea  ? ?Terri Wood is a 56 y.o. female who presents today for history and physical for left total knee arthroplasty with Dr. Kennedy Bucker on 02/16/2022. Patient has had progressive left knee pain over the last 6 months to 12 months. She describes swelling, instability and pain increased with weightbearing activity. She is had successful right total knee arthroplasty back in September 2022 and has been doing well. 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 She denies any recent falls trauma or injury. Cortisone injections have given her relief in the past for her left knee but recently have not been providing her with any relief. Patient is ready to proceed with left total knee arthroplasty at this time. She has seen Dr. Rosita Kea, discussed risk benefits and complications of surgical intervention. ? ?No history of blood clots.  ? ?Past Medical History: ?Past Medical History:  ?Diagnosis Date  ? Acute CVA (cerebrovascular accident) (CMS-HCC) 08/15/2019  ? Acute hip pain, left 02/28/2018  ? Acute respiratory failure (CMS-HCC) 01/16/2021  ? Anxiety  ? Avascular necrosis of left femoral head (CMS-HCC) 04/09/2018  ? Body mass index 40.0-44.9, adult (CMS-HCC) 02/11/2016  ? Carotid artery stenosis 08/05/2019  ?Note: right - near complete  ? Cerebrovascular accident (CVA) due to occlusion of right middle cerebral artery (CMS-HCC) 05/03/2020  ? Cerebrovascular disease 08/05/2019  ?Note: right MCA blockage  ? 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 Community acquired pneumonia 01/16/2021  ? CVA, old, hemiparesis (CMS-HCC) 01/16/2021  ? Depression  ? Depression, major, single episode, complete remission (CMS-HCC) 11/05/2015  ? Depressive disorder 02/02/2015  ? Essential hypertension 02/11/2016  ? Fibromyalgia  ? Foraminal stenosis of lumbar region  02/27/2018  ? Generalized osteoarthrosis, involving multiple sites 08/07/2015  ? Hyperkalemia 09/13/2017  ? Hyperlipidemia 04/27/2021  ? Hypertension  ? Joint pain  ? Lumbar radiculopathy 12/15/2016  ? Major depressive disorder, recurrent episode, moderate (CMS-HCC) 11/08/2018  ? Morbid obesity (CMS-HCC) 01/16/2021  ? Myalgia and myositis 11/08/2018  ? Numbness and tingling in both hands 02/11/2016  ? Osteoarthritis  ? Pain, joint, multiple sites 02/28/2018  ? Perimenopausal symptoms 12/15/2016  ? Prediabetes 04/07/2019  ?Note: Unchanged  ? Primary osteoarthritis of right knee 08/07/2017  ? Restless leg syndrome  ? Restless legs syndrome 02/02/2015  ? Sciatica 06/25/2015  ? ?Past Surgical History: ?Past Surgical History:  ?Procedure Laterality Date  ? GASTRIC BYPASS OPEN 1996  ? TARSAL TUNNEL RELEASE 2009  ? total hip arhtroplasty anterior approach Left 04/09/2018  ?Dr.  ? ARTHROPLASTY TOTAL KNEE Right 07/12/2021  ?  ? R. Plantar Fascia surgery Right  ? ?Past Family History: ?Family History  ?Problem Relation Age of Onset  ? Osteoarthritis Mother  ? High blood pressure (Hypertension) Mother  ? Arthritis Father  ? Colon cancer Paternal Grandfather  ? ?Medications: ? ?Allergies: ?Allergies  ?Allergen Reactions  ? Penicillins Other (See Comments), Rash and Hives  ?Did it involve swelling of the face/tongue/throat, SOB, or low BP? Yes ?Did it involve sudden or severe rash/hives, skin peeling, or any reaction on the inside of your mouth or nose? No ?Did you need to seek medical attention at a hospital or doctor's office? No ?When did it last happen? Within the past 10 years  ?If all above answers are ?NO?, may proceed with cephalosporin use. ?Has patient had  a PCN reaction causing immediate rash, facial/tongue/throat swelling, SOB or lightheadedness with hypotension: Yes ?Has patient had a PCN reaction causing severe rash involving mucus membranes or skin necrosis: No ?Has patient had a PCN reaction that required  hospitalization: No ?Has patient had a PCN reaction occurring within the last 10 years: Yes ?If all of the above answers are "NO", then may proceed with Cephalosporin use. ? ? Levofloxacin Dizziness  ?Dizziness  ? Penicillin Hives  ? Vilazodone Other (See Comments)  ? ? ?Review of Systems:  ?A comprehensive 14 point ROS was performed, reviewed by me today, and the pertinent orthopaedic findings are documented in the HPI. ? ?Exam: ?BP 110/64  Ht 154.9 cm (5\' 1" )  Wt (!) 116.6 kg (257 lb)  LMP 01/12/2018 (LMP Unknown)  BMI 48.56 kg/m?  ?General:  ?Well developed, well nourished, no apparent distress, normal affect, normal gait with no antalgic component.  ? ?HEENT: ?Head normocephalic, atraumatic, PERRL.  ? ?Abdomen: ?Soft, non tender, non distended, Bowel sounds present. ? ?Heart: ?Examination of the heart reveals regular, rate, and rhythm. There is no murmur noted on ascultation. There is a normal apical pulse. ? ?Lungs: ?Lungs are clear to auscultation. There is no wheeze, rhonchi, or crackles. There is normal expansion of bilateral chest walls.  ? ?Left knee: ?Examination of the left lower extremity shows patient has good hip internal ex rotation. She ambulates with antalgic gait with no assist devices. She has no effusion. No warmth or redness throughout the knee. She has 0 to 115 degrees range of motion of the left knee with no laxity valgus varus stress testing. She is tender along the medial joint line and nontender along the lateral joint line. She is neuro vas intact in left lower extremity. ? ?AP lateral sunrise views left knee are reviewed by me today from 11/18/2021. Impression: Patient has complete loss of joint space in the medial compartment of the left knee with varus deformity. Sclerotic changes and spurring noted along the lateral tibial plateau. Advanced patellofemoral osteoarthritis. No evidence of acute bony abnormality or effusion ? ?Impression: ?Primary osteoarthritis of left knee  [M17.12] ?Primary osteoarthritis of left knee (primary encounter diagnosis) ? ?Plan:  ?73. 56 year old female with advanced left knee osteoarthritis. Pain is interfering with quality of life and activities daily living. She is had successful right total knee arthroplasty 4 to 5 months ago and has been doing very well. She is interested in proceeding with left total knee arthroplasty due to no relief with conservative treatment and limited ability to perform ADLs. Risks, benefits, complications of a left total knee arthroplasty have been discussed with the patient. Patient has agreed to consent the procedure with Dr. 57 on 02/16/2022. ? ?This note was generated in part with voice recognition software and I apologize for any typographical errors that were not detected and corrected. ? ?04/18/2022 MPA-C  ? ? ? ?Reviewed  H+P. ?No changes noted. ? ?Electronically signed by Patience Musca, PA at 02/06/2022 10:34 AM EDT ?

## 2022-02-16 NOTE — Op Note (Signed)
02/16/2022 ? ?10:48 AM ? ?PATIENT:  Terri Wood  ? ?MRN: [id number] ? ?PRE-OPERATIVE DIAGNOSIS:  Primary localized osteoarthritis of left knee ?  ?POST-OPERATIVE DIAGNOSIS:  Same ?  ?PROCEDURE:  Procedure(s): ?Left TOTAL KNEE ARTHROPLASTY ?  ?SURGEON: Laurene Footman, MD ?  ?ASSISTANTS: None ?  ?ANESTHESIA:   spinal ?  ?EBL:  200 ?  ?BLOOD ADMINISTERED:none ?  ?DRAINS:  Incisional wound VAC   ?  ?LOCAL MEDICATIONS USED:  MARCAINE    and OTHER Exparel and morphine ?  ?SPECIMEN:  No Specimen ?  ?DISPOSITION OF SPECIMEN:  N/A ?  ?COUNTS:  YES ?  ?TOURNIQUET: 47 minutes at 300 mm Hg ?  ?IMPLANTS: Medacta  GMK sphere system with left 2+ femur, left 2 tibia with short stem and 17 mm insert.  Size 2 patella, all components cemented. ?  ?DICTATION: .Dragon Dictation   patient was brought to the operating room and spinal anesthesia was obtained.  After prepping and draping the left leg in sterile fashion, and after patient identification and timeout procedures were completed, a midline skin incision was made followed by medial parapatellar arthrotomy with severe medial compartment osteoarthritis, severe patellofemoral arthritis and advanced lateral compartment arthritis, partial synovectomy was also carried out.   The ACL and PCL and fat pad were excised along with anterior horns of the meniscus. The proximal tibia cutting guide from  the [facility] system was applied and the proximal tibia cut carried out.  The distal femoral cut was carried out in a similar fashion     The 2+ femoral cutting guide applied with anterior posterior and chamfer cuts made.  The posterior horns of the menisci were removed at this point.   Injection of the above medication was carried out after the femoral and tibial cuts were carried out.  The 2 baseplate trial was placed pinned into position and proximal tibial preparation carried out with drilling hand reaming and the keel punch followed by placement of the 2+ femur and sizing the tibial insert  size 17 millimeter gave the best fit with stability and full extension.  At this point tourniquet was raised secondary to bleeding from the bone.  The distal femoral drill holes were made in the notch cut for the trochlear groove was then carried out with trials were then removed the patella was cut using the patellar cutting guide and it sized to a size 2 after drill holes have been made  The knee was irrigated with pulsatile lavage and the bony surfaces dried the tibial component was cemented into place first.  Excess cement was removed and the polyethylene insert placed with a torque screw placed with a torque screwdriver tightened.  The distal femoral component was placed and the knee was held in extension as the patellar button was clamped into place.  After the cement was set, excess cement was removed and the knee was again irrigated thoroughly thoroughly irrigated.  The tourniquet was let down and hemostasis checked with electrocautery.  A lateral release was required for good tracking with initial capsular repair with #2 Ethibond.  The arthrotomy was repaired with a heavy Quill suture,  followed by 3-0 V lock subcuticular closure, skin staples followed by incisional wound VAC and Polar Care.. ?  ?PLAN OF CARE: Admit for overnight observation ?  ?PATIENT DISPOSITION:  PACU - hemodynamically stable. ?  ?  ?   ?  ?  ?  ? ? ?

## 2022-02-16 NOTE — Anesthesia Preprocedure Evaluation (Signed)
Anesthesia Evaluation  ?Patient identified by MRN, date of birth, ID band ?Patient awake ? ? ? ?Reviewed: ?Allergy & Precautions, NPO status , Patient's Chart, lab work & pertinent test results ? ?History of Anesthesia Complications ?Negative for: history of anesthetic complications ? ?Airway ?Mallampati: II ? ?TM Distance: >3 FB ?Neck ROM: Full ? ? ? Dental ? ?(+) Partial Upper, Partial Lower ?  ?Pulmonary ?neg pulmonary ROS, neg sleep apnea, neg COPD, Patient abstained from smoking.Not current smoker, former smoker,  ?  ?Pulmonary exam normal ?breath sounds clear to auscultation ? ? ? ? ? ? Cardiovascular ?Exercise Tolerance: Good ?METShypertension, Pt. on medications ?(-) CAD and (-) Past MI (-) dysrhythmias  ?Rhythm:Regular Rate:Normal ?- Systolic murmurs ??1. Left ventricular ejection fraction, by visual estimation, is 50 to  ?55%. The left ventricle has normal function. There is no left ventricular  ?hypertrophy.  ??2. Global right ventricle has normal systolic function.The right  ?ventricular size is normal. No increase in right ventricular wall  ?thickness.  ??3. Left atrial size was mildly dilated.  ??4. Right atrial size was normal.  ??5. The mitral valve is normal in structure. Trace mitral valve  ?regurgitation.  ??6. The tricuspid valve is normal in structure. Tricuspid valve  ?regurgitation is trivial.  ??7. The aortic valve is normal in structure. Aortic valve regurgitation is  ?not visualized. No evidence of aortic valve sclerosis or stenosis.  ??8. The pulmonic valve was grossly normal. Pulmonic valve regurgitation is  ?not visualized.  ??9. The atrial septum is grossly normal. ?  ?Neuro/Psych ?PSYCHIATRIC DISORDERS Anxiety Depression Residual left hand weakness ? Neuromuscular disease CVA, Residual Symptoms   ? GI/Hepatic ?GERD  Medicated and Controlled,(+)  ?  ? (-) substance abuse ? ,   ?Endo/Other  ?neg diabetesMorbid obesity ? Renal/GU ?negative Renal ROS  ? ?   ?Musculoskeletal ? 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 Abdominal ?(+) + obese,   ?Peds ? Hematology ?  ?Anesthesia Other Findings ?Past Medical History: ?No date: Anxiety ?08/05/2019: Carotid artery stenosis ?    Comment:  right ?No date: Depression ?No date: Fibromyalgia ?No date: GERD (gastroesophageal reflux disease) ?No date: Hyperlipidemia ?No date: Hypertension ?2019: Osteoarthritis of left hip ?No date: Pneumonia ?    Comment:  01/16/2021 and 06/05/2021 ?No date: Pre-diabetes ?08/05/2019: Stroke (HCC) ?    Comment:  occlusion of right middle cerebral artery ? Reproductive/Obstetrics ? ?  ? ? ? ? ? ? ? ? ? ? ? ? ? ?  ?  ? ? ? ? ? ? ? ?                                  Anesthesia Evaluation  ?Patient identified by MRN, date of birth, ID band ?Patient awake ? ? ? ?Reviewed: ?Allergy & Precautions, H&P , NPO status , Patient's Chart, lab work & pertinent test results, reviewed documented beta blocker date and time  ? ?History of Anesthesia Complications ?Negative for: history of anesthetic complications ? ?Airway ?Mallampati: I ? ?TM Distance: >3 FB ?Neck ROM: full ? ? ? Dental ? ?(+) Dental Advidsory Given, Missing, Chipped, Poor Dentition, Teeth Intact ?  ?Pulmonary ?neg shortness of breath, neg sleep apnea, neg COPD, neg recent URI, Current Smoker,  ?  ? ? ? ? ? ? ? Cardiovascular ?Exercise Tolerance: Good ?hypertension, (-) angina(-) CAD, (-) Past MI, (-) Cardiac Stents and (-)  CABG (-) dysrhythmias (-) Valvular Problems/Murmurs ? ? ?  ?Neuro/Psych ?neg Seizures PSYCHIATRIC DISORDERS Anxiety  Neuromuscular disease (fibromyalgia)   ? GI/Hepatic ?Neg liver ROS, GERD  ,  ?Endo/Other  ?neg diabetesMorbid obesity ? Renal/GU ?negative Renal ROS  ?negative genitourinary ?  ?Musculoskeletal ? ? Abdominal ?  ?Peds ? Hematology ?negative hematology ROS ?(+)   ?Anesthesia Other Findings ?Past Medical History: ?No date: Anxiety ?No date: Fibromyalgia ?No date:  Hypertension ?2019: Osteoarthritis of left hip ? ? Reproductive/Obstetrics ?negative OB ROS ? ?  ? ? ? ? ? ? ? ? ? ? ? ? ? ?  ?  ? ? ? ? ? ? ? ? ?Anesthesia Physical ?Anesthesia Plan ? ?ASA: III ? ?Anesthesia Plan: Spinal  ? ?Post-op Pain Management:   ? ?Induction: Intravenous ? ?PONV Risk Score and Plan: 1 ? ?Airway Management Planned: Simple Face Mask and Natural Airway ? ?Additional Equipment:  ? ?Intra-op Plan:  ? ?Post-operative Plan:  ? ?Informed Consent: I have reviewed the patients History and Physical, chart, labs and discussed the procedure including the risks, benefits and alternatives for the proposed anesthesia with the patient or authorized representative who has indicated his/her understanding and acceptance.  ? ?Dental Advisory Given ? ?Plan Discussed with: Anesthesiologist, CRNA and Surgeon ? ?Anesthesia Plan Comments:   ? ? ? ? ? ? ?Anesthesia Quick Evaluation ? ?Anesthesia Physical ? ?Anesthesia Plan ? ?ASA: 2 ? ?Anesthesia Plan: Spinal  ? ?Post-op Pain Management:   ? ?Induction: Intravenous ? ?PONV Risk Score and Plan: 3 and Ondansetron, Dexamethasone, Propofol infusion, TIVA and Midazolam ? ?Airway Management Planned: Natural Airway ? ?Additional Equipment: None ? ?Intra-op Plan:  ? ?Post-operative Plan:  ? ?Informed Consent: I have reviewed the patients History and Physical, chart, labs and discussed the procedure including the risks, benefits and alternatives for the proposed anesthesia with the patient or authorized representative who has indicated his/her understanding and acceptance.  ? ? ? ? ? ?Plan Discussed with: CRNA and Surgeon ? ?Anesthesia Plan Comments: (Plavix last taken 7 days ago ? ?Discussed R/B/A of neuraxial anesthesia technique with patient: ?- rare risks of spinal/epidural hematoma, nerve damage, infection ?- Risk of PDPH ?- Risk of nausea and vomiting ?- Risk of conversion to general anesthesia and its associated risks, including sore throat, damage to  lips/eyes/teeth/oropharynx, and rare risks such as cardiac and respiratory events. ?- Risk of allergic reactions ? ?Discussed the role of CRNA in patient's perioperative care. ? ?Patient voiced understanding.)  ? ? ? ? ? ? ?Anesthesia Quick Evaluation ? ?

## 2022-02-16 NOTE — OR Nursing (Signed)
Patient arrived to OR with cell phone. Had patient turn to airplane mode and placed in patient labeled bag and transported to PACU with her at end of case. ?Glenna Durand, RN ?

## 2022-02-16 NOTE — TOC Progression Note (Signed)
Transition of Care (TOC) - Progression Note  ? ? ?Patient Details  ?Name: Terri Wood ?MRN: [id number] ?Date of Birth: [birth year] ? ?Transition of Care (TOC) CM/SW Contact  ?Marlowe Sax, RN ?Phone Number: ?02/16/2022, 2:16 PM ? ?Clinical Narrative:    ?The patient is open with [facility], set up prior to surgery by Surgeons office ? ? ?  ?  ? ?Expected Discharge Plan and Services ?  ?  ?  ?  ?  ?                ?  ?  ?  ?  ?  ?  ?  ?  ?  ?  ? ? ?Social Determinants of Health (SDOH) Interventions ?  ? ?Readmission Risk Interventions ?   ? View : No data to display.  ?  ?  ?  ? ? ?

## 2022-02-17 LAB — BASIC METABOLIC PANEL
Anion gap: 6 (ref 5–15)
BUN: 17 mg/dL (ref 6–20)
CO2: 28 mmol/L (ref 22–32)
Calcium: 8.4 mg/dL — ABNORMAL LOW (ref 8.9–10.3)
Chloride: 99 mmol/L (ref 98–111)
Creatinine, Ser: 0.92 mg/dL (ref 0.44–1.00)
GFR, Estimated: >60 mL/min (ref >60)
Glucose, Bld: 175 mg/dL — ABNORMAL HIGH (ref 70–99)
Potassium: 4.4 mmol/L (ref 3.5–5.1)
Sodium: 133 mmol/L — ABNORMAL LOW (ref 135–145)

## 2022-02-17 LAB — CBC
HCT: 37.9 % (ref 36.0–46.0)
Hemoglobin: 11.8 g/dL — ABNORMAL LOW (ref 12.0–15.0)
MCH: 29.8 pg (ref 26.0–34.0)
MCHC: 31.1 g/dL (ref 30.0–36.0)
MCV: 95.7 fL (ref 80.0–100.0)
Platelets: 297 10*3/uL (ref 150–400)
RBC: 3.96 MIL/uL (ref 3.87–5.11)
RDW: 15.1 % (ref 11.5–15.5)
WBC: 11 10*3/uL — ABNORMAL HIGH (ref 4.0–10.5)
nRBC: 0 % (ref 0.0–0.2)

## 2022-02-17 MED ORDER — TRAMADOL HCL 50 MG PO TABS
ORAL_TABLET | ORAL | Status: AC
  Filled 2022-02-17: qty 1

## 2022-02-17 MED ORDER — PANTOPRAZOLE SODIUM 40 MG PO TBEC
DELAYED_RELEASE_TABLET | ORAL | Status: AC
  Administered 2022-02-17: 40 mg via ORAL
  Filled 2022-02-17: qty 1

## 2022-02-17 MED ORDER — OXYCODONE HCL 5 MG PO TABS
ORAL_TABLET | ORAL | Status: AC
  Filled 2022-02-17: qty 1

## 2022-02-17 MED ORDER — TRAMADOL HCL 50 MG PO TABS: 50.0000 mg | ORAL_TABLET | Freq: Four times a day (QID) | ORAL | 0 refills | Status: DC

## 2022-02-17 MED ORDER — TRAMADOL HCL 50 MG PO TABS
ORAL_TABLET | ORAL | Status: AC
  Administered 2022-02-17: 50 mg via ORAL
  Filled 2022-02-17: qty 1

## 2022-02-17 MED ORDER — SODIUM CHLORIDE 0.9 % IV SOLN: INTRAVENOUS | Status: DC

## 2022-02-17 MED ORDER — HYDROMORPHONE HCL 1 MG/ML IJ SOLN
INTRAMUSCULAR | Status: AC
  Filled 2022-02-17: qty 0.5

## 2022-02-17 MED ORDER — METHOCARBAMOL 500 MG PO TABS
ORAL_TABLET | ORAL | Status: AC
  Filled 2022-02-17: qty 1

## 2022-02-17 MED ORDER — ASPIRIN 81 MG PO CHEW
CHEWABLE_TABLET | ORAL | Status: AC
  Filled 2022-02-17: qty 1

## 2022-02-17 MED ORDER — ASPIRIN 81 MG PO CHEW
CHEWABLE_TABLET | ORAL | Status: AC
  Administered 2022-02-17: 81 mg via ORAL
  Filled 2022-02-17: qty 1

## 2022-02-17 MED ORDER — OXYCODONE HCL 5 MG PO TABS
ORAL_TABLET | ORAL | Status: AC
  Administered 2022-02-17: 5 mg via ORAL
  Filled 2022-02-17: qty 1

## 2022-02-17 MED ORDER — OXYCODONE HCL 5 MG PO TABS: 5.0000 mg | ORAL_TABLET | ORAL | 0 refills | Status: DC | PRN

## 2022-02-17 MED ORDER — OXYCODONE HCL 5 MG PO TABS
ORAL_TABLET | ORAL | Status: AC
  Filled 2022-02-17: qty 2

## 2022-02-17 MED ORDER — DOCUSATE SODIUM 100 MG PO CAPS
ORAL_CAPSULE | ORAL | Status: AC
  Administered 2022-02-17: 100 mg via ORAL
  Filled 2022-02-17: qty 1

## 2022-02-17 NOTE — Progress Notes (Signed)
?  Subjective: ?1 Day Post-Op Procedure(s) (LRB): ?TOTAL KNEE ARTHROPLASTY (Left) ?Patient reports pain as moderate.  The patient had more pain last night.  They did give her Dilaudid 1 time. ?Patient is well, and has had no acute complaints or problems ?Plan is to go Home after hospital stay. ?Negative for chest pain and shortness of breath ?Fever: no ?Gastrointestinal: Negative for nausea and vomiting ? ?Objective: ?Vital signs in last 24 hours: ?Temp:  [97.2 ?F (36.2 ?C)-98.5 ?F (36.9 ?C)] 98.4 ?F (36.9 ?C) (05/05 0400) ?Pulse Rate:  [70-89] 88 (05/05 0400) ?Resp:  [10-19] 18 (05/05 0400) ?BP: (85-134)/(44-75) 119/67 (05/05 0400) ?SpO2:  [85 %-97 %] 96 % (05/05 0400) ?Weight:  [116.6 kg] 116.6 kg (05/04 0749) ? ?Intake/Output from previous day: ? ?Intake/Output Summary (Last 24 hours) at 02/17/2022 0647 ?Last data filed at 02/17/2022 0400 ?Gross per 24 hour  ?Intake 2575 ml  ?Output 2475 ml  ?Net 100 ml  ?  ?Intake/Output this shift: ?Total I/O ?In: 739 [P.O.:240; I.V.:400; IV Piggyback:99] ?Out: 1250 [Urine:1250] ? ?Labs: ?Recent Labs  ?  02/17/22 ?0319  ?HGB 11.8*  ? ?Recent Labs  ?  02/17/22 ?0319  ?WBC 11.0*  ?RBC 3.96  ?HCT 37.9  ?PLT 297  ? ?Recent Labs  ?  02/17/22 ?0319  ?NA 133*  ?K 4.4  ?CL 99  ?CO2 28  ?BUN 17  ?CREATININE 0.92  ?GLUCOSE 175*  ?CALCIUM 8.4*  ? ?No results for input(s): LABPT, INR in the last 72 hours. ? ? ?EXAM ?General - Patient is Alert and Oriented ?Extremity - Neurovascular intact ?Sensation intact distally ?Dorsiflexion/Plantar flexion intact ?Compartment soft ?Dressing/Incision - clean, dry, with the wound VAC in place ?Motor Function - intact, moving foot and toes well on exam.  Ambulated 3 feet with physical therapy ? ?Past Medical History:  ?Diagnosis Date  ? Anxiety   ? Avascular necrosis of left femoral head (HCC) 04/09/2018  ? Carotid artery stenosis 08/05/2019  ? right  ? Cerebrovascular accident (CVA) due to occlusion of right middle cerebral artery (HCC) 05/03/2020  ? Chronic  midline low back pain with right-sided sciatica 02/11/2016  ? Depression   ? Fibromyalgia   ? Foraminal stenosis of lumbar region 02/27/2018  ? GERD (gastroesophageal reflux disease)   ? Hyperkalemia 09/13/2017  ? Hyperlipidemia   ? Hypertension   ? Lumbar radiculopathy 12/15/2016  ? Osteoarthritis of left hip 2019  ? Pneumonia   ? 01/16/2021 and 06/05/2021  ? Pre-diabetes   ? Restless leg syndrome 02/02/2015  ? Stroke [facility]) 08/05/2019  ? occlusion of right middle cerebral artery  ? ? ?Assessment/Plan: ?1 Day Post-Op Procedure(s) (LRB): ?TOTAL KNEE ARTHROPLASTY (Left) ?Principal Problem: ?  S/P TKR (total knee replacement) using cement, left ? ?Estimated body mass index is 48.57 kg/m? as calculated from the following: ?  Height as of this encounter: 5\' 1"  (1.549 m). ?  Weight as of this encounter: 116.6 kg. ?Advance diet ?Up with therapy ?D/C IV fluids ?Discharge home with home health ? ?DVT Prophylaxis - Foot Pumps, TED hose, and Plavix ?Weight-Bearing as tolerated to left leg ? ? , PA-C ?Orthopaedic Surgery ?02/17/2022, 6:47 AM ? ?

## 2022-02-17 NOTE — TOC Progression Note (Signed)
Transition of Care (TOC) - Progression Note  ? ? ?Patient Details  ?Name: Terri Wood ?MRN: [id number] ?Date of Birth: [birth year] ? ?Transition of Care (TOC) CM/SW Contact  ?Marlowe Sax, RN ?Phone Number: ?02/17/2022, 1:36 PM ? ?Clinical Narrative:    ?Recommendation fro SNF STR, Bedsearch sent, FL2 completed, PASSR obtained, will review the bed offers once obtained ? ? ?Expected Discharge Plan: Home w Home Health Services ?Barriers to Discharge: Continued Medical Work up ? ?Expected Discharge Plan and Services ?Expected Discharge Plan: Home w Home Health Services ?  ?  ?  ?Living arrangements for the past 2 months: Single Family Home ?Expected Discharge Date: 02/17/22               ?  ?  ?  ?  ?  ?  ?  ?  ?  ?  ? ? ?Social Determinants of Health (SDOH) Interventions ?  ? ?Readmission Risk Interventions ?   ? View : No data to display.  ?  ?  ?  ? ? ?

## 2022-02-17 NOTE — Discharge Summary (Addendum)
Physician Discharge Summary  ?Subjective: ?1 Day Post-Op Procedure(s) (LRB): ?TOTAL KNEE ARTHROPLASTY (Left) ?Patient reports pain as moderate.   ?Patient seen in rounds with Dr. Rosita Kea. ?Patient is well, and has had no acute complaints or problems ?Patient is ready to go home with home health physical therapy. ? ?Physician Discharge Summary  ?Patient ID: ?Terri Wood ?MRN: [id number] ?DOB/AGE: 1966/06/25 56 y.o. ? ?Admit date: 02/16/2022 ?Discharge date: 02/18/2022 ? ?Admission Diagnoses: ? ?Discharge Diagnoses:  ?Principal Problem: ?  S/P TKR (total knee replacement) using cement, left ? ? ?Discharged Condition: fair ? ?Hospital Course: The patient is postop day 1 from a left total knee arthroplasty.  Her vitals have remained stable.   She is having some issues with pain management and had to have Dilaudid 1 time during the night.  She is doing gentle range of motion activities of the bed.  She did ambulate 3 feet.  The plan is for her to go home today with home health physical therapy. ? ?Treatments: surgery:  ?patient is postop day 1 from a left total knee arthroplasty.  She is having some issues with pain management and had to have Dilaudid 1 time during the night.  She is doing gentle range of motion activities of the bed.  She did ambulate 3 feet.  The plan is for her to go home today with home health physical therapy. ? ?Discharge Exam: ?Blood pressure (!) 146/65, pulse 96, temperature 98.1 ?F (36.7 ?C), temperature source Temporal, resp. rate 18, height 5\' 1"  (1.549 m), weight 116.6 kg, last menstrual period 04/01/2018, SpO2 96 %. ? ? ?Disposition: Discharge disposition: 01-Home or Self Care ? ? ? ? ? ? ? ?Allergies as of 02/17/2022   ? ?   Reactions  ? Penicillins Hives  ? TOLERATED ROCEPHIN AND CEFAZOLIN ?Did it involve swelling of the face/tongue/throat, SOB, or low BP? Yes ?Did it involve sudden or severe rash/hives, skin peeling, or any reaction on the inside of your mouth or nose? No ?Did you need to seek  medical attention at a hospital or doctor's office? No ?When did it last happen? Within the past 10 years  ?If all above answers are "NO", may proceed with cephalosporin use.  ? Levofloxacin   ? Dizziness   ? ?  ? ?  ?Medication List  ?  ? ?STOP taking these medications   ? ?HYDROcodone-acetaminophen 10-325 MG tablet ?Commonly known as: NORCO ?  ? ?  ? ?TAKE these medications   ? ?amLODipine 10 MG tablet ?Commonly known as: NORVASC ?Take 5 mg by mouth at bedtime. ?  ?atorvastatin 80 MG tablet ?Commonly known as: LIPITOR ?Take 80 mg by mouth daily. ?  ?baclofen 10 MG tablet ?Commonly known as: LIORESAL ?Take 5 mg by mouth at bedtime as needed for muscle spasms. ?  ?buPROPion 150 MG 24 hr tablet ?Commonly known as: WELLBUTRIN XL ?Take 150 mg by mouth daily. ?  ?Cal Mag Zinc +D3 Tabs ?Take 1 tablet by mouth daily. ?  ?CALTRATE 600+D PO ?Take 1 tablet by mouth daily. ?  ?celecoxib 200 MG capsule ?Commonly known as: CELEBREX ?Take 200 mg by mouth daily. ?  ?cloNIDine 0.1 MG tablet ?Commonly known as: CATAPRES ?Take 0.1 mg by mouth at bedtime. ?  ?clopidogrel 75 MG tablet ?Commonly known as: PLAVIX ?Take 75 mg by mouth daily. ?  ?diclofenac Sodium 1 % Gel ?Commonly known as: VOLTAREN ?Apply 1 application topically 3 (three) times daily as needed (pain). ?  ?docusate sodium 100  MG capsule ?Commonly known as: COLACE ?Take 1 capsule (100 mg total) by mouth 2 (two) times daily. ?  ?DULoxetine 60 MG capsule ?Commonly known as: CYMBALTA ?Take 60 mg by mouth 2 (two) times daily. ?  ?Gemtesa 75 MG Tabs ?Generic drug: Vibegron ?Take 1 tablet by mouth daily. ?  ?lisinopril-hydrochlorothiazide 20-25 MG tablet ?Commonly known as: ZESTORETIC ?Take 1 tablet by mouth daily. ?  ?MENOPAUSE RELIEF PO ?Take 2 tablets by mouth daily. Amberen ?  ?methocarbamol 500 MG tablet ?Commonly known as: ROBAXIN ?Take 1 tablet (500 mg total) by mouth every 6 (six) hours as needed for muscle spasms. ?  ?minocycline 100 MG capsule ?Commonly known as:  MINOCIN ?Take 100 mg by mouth 2 (two) times daily. ?  ?MULTI FOR HER 50+ PO ?Take 1 tablet by mouth daily. ?  ?Omega 3 1000 MG Caps ?Take 1,000 mg by mouth daily with breakfast. ?  ?omeprazole 40 MG capsule ?Commonly known as: PRILOSEC ?Take 40 mg by mouth daily. ?  ?oxyCODONE 5 MG immediate release tablet ?Commonly known as: Oxy IR/ROXICODONE ?Take 1-2 tablets (5-10 mg total) by mouth every 4 (four) hours as needed for moderate pain (pain score 4-6). ?  ?OZEMPIC (2 MG/DOSE) [place] ?Inject 2 mg into the skin once a week. Friday ?  ?Ozempic (1 MG/DOSE) 4 MG/3ML Sopn ?Generic drug: Semaglutide (1 MG/DOSE) ?Inject 1 mg into the skin every 7 (seven) days. ?  ?pimecrolimus 1 % cream ?Commonly known as: ELIDEL ?Apply 1 application. topically in the morning and at bedtime. ?  ?polyethylene glycol 17 g packet ?Commonly known as: MIRALAX / GLYCOLAX ?Take 17 g by mouth daily as needed for mild constipation. ?  ?traMADol 50 MG tablet ?Commonly known as: ULTRAM ?Take 1 tablet (50 mg total) by mouth every 6 (six) hours. ?  ?Vitamin D3 125 MCG (5000 UT) Caps ?Take 5,000 Units by mouth daily. ?  ?vortioxetine HBr 5 MG Tabs tablet ?Commonly known as: TRINTELLIX ?Take 5 mg by mouth daily. ?  ? ?  ? ?  ?  ? ? ?  ?Durable Medical Equipment  ?(From admission, onward)  ?  ? ? ?  ? ?  Start     Ordered  ? 02/16/22 1155  DME Walker rolling  Once       ?Comments: Bariatric  ?Question Answer Comment  ?Walker: With 5 Inch Wheels   ?Patient needs a walker to treat with the following condition S/P TKR (total knee replacement) using cement, left   ?  ? 02/16/22 1154  ? 02/16/22 1155  DME 3 n 1  Once       ? 02/16/22 1154  ? 02/16/22 1155  DME Bedside commode  Once       ?Comments: Bariatric  ?Question:  Patient needs a bedside commode to treat with the following condition  Answer:  S/P TKR (total knee replacement) using cement, left  ? 02/16/22 1154  ? ?  ?  ? ?  ? ? Follow-up Information   ? ? Evon Slack, PA-C Follow up in 2 week(s).    ?Specialties: Orthopedic Surgery, Emergency Medicine ?Why: For staple removal and wound care ?Contact information: ?[address] ?[facility] ?[place] [address] ?[phone] ? ? ?  ?  ? ?  ?  ? ?  ? ? ?Signed: ?,  ?02/17/2022, 2:42 PM ? ? ?Objective: ?Vital signs in last 24 hours: ?Temp:  [97.2 ?F (36.2 ?C)-98.4 ?F (36.9 ?C)] 98.1 ?F (  36.7 ?C) (05/05 1331) ?Pulse Rate:  [72-96] 96 (05/05 1331) ?Resp:  [16-18] 18 (05/05 1331) ?BP: (117-146)/(60-79) 146/65 (05/05 1331) ?SpO2:  [91 %-96 %] 96 % (05/05 1331) ? ?Intake/Output from previous day: ? ?Intake/Output Summary (Last 24 hours) at 02/17/2022 1442 ?Last data filed at 02/17/2022 0600 ?Gross per 24 hour  ?Intake 1475 ml  ?Output 1950 ml  ?Net -475 ml  ?  ?Intake/Output this shift: ?No intake/output data recorded. ? ?Labs: ?Recent Labs  ?  02/17/22 ?0319  ?HGB 11.8*  ? ?Recent Labs  ?  02/17/22 ?0319  ?WBC 11.0*  ?RBC 3.96  ?HCT 37.9  ?PLT 297  ? ?Recent Labs  ?  02/17/22 ?0319  ?NA 133*  ?K 4.4  ?CL 99  ?CO2 28  ?BUN 17  ?CREATININE 0.92  ?GLUCOSE 175*  ?CALCIUM 8.4*  ? ?No results for input(s): LABPT, INR in the last 72 hours. ? ?EXAM: ?General - Patient is Alert and Oriented ?Extremity - Neurovascular intact ?Sensation intact distally ?Dorsiflexion/Plantar flexion intact ?Compartment soft ?Incision - clean, dry, with the wound VAC in place ?Motor Function -plantarflexion and dorsiflexion are intact.  Ambulated 3 feet with physical therapy. ? ?Assessment/Plan: ?1 Day Post-Op Procedure(s) (LRB): ?TOTAL KNEE ARTHROPLASTY (Left) ?Procedure(s) (LRB): ?TOTAL KNEE ARTHROPLASTY (Left) ?Past Medical History:  ?Diagnosis Date  ? Anxiety   ? Avascular necrosis of left femoral head (HCC) 04/09/2018  ? Carotid artery stenosis 08/05/2019  ? right  ? Cerebrovascular accident (CVA) due to occlusion of right middle cerebral artery (HCC) 05/03/2020  ? 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 Depression   ?  Fibromyalgia   ? Foraminal stenosis of lumbar region 02/27/2018  ? GERD (gastroesophageal reflux disease)   ? Hyperkalemia 09/13/2017  ? Hyperlipidemia   ? Hypertension   ? Lumbar radiculopathy 12/15/2016  ? Osteoarthri

## 2022-02-17 NOTE — Anesthesia Postprocedure Evaluation (Signed)
Anesthesia Post Note ? ?Patient: Terri Wood ? ?Procedure(s) Performed: TOTAL KNEE ARTHROPLASTY (Left: Knee) ? ?Patient location during evaluation: Nursing Unit ?Anesthesia Type: Spinal ?Level of consciousness: oriented and awake and alert ?Pain management: pain level controlled ?Vital Signs Assessment: post-procedure vital signs reviewed and stable ?Respiratory status: spontaneous breathing and respiratory function stable ?Cardiovascular status: blood pressure returned to baseline and stable ?Postop Assessment: no headache, no backache, no apparent nausea or vomiting and patient able to bend at knees ?Anesthetic complications: no ? ? ?No notable events documented. ? ? ?Last Vitals:  ?Vitals:  ? 02/17/22 0400 02/17/22 0730  ?BP: 119/67 124/79  ?Pulse: 88 78  ?Resp: 18 18  ?Temp: 36.9 ?C 36.9 ?C  ?SpO2: 96% 96%  ?  ?Last Pain:  ?Vitals:  ? 02/17/22 0730  ?TempSrc: Oral  ?PainSc: 5   ? ? ?  ?  ?  ?  ?  ?  ? ?Stormy Fabian A ? ? ? ? ?

## 2022-02-17 NOTE — Progress Notes (Signed)
Physical Therapy Treatment ?Patient Details ?Name: Terri Wood ?MRN: [id number] ?DOB: 19-Aug-1966 ?Today's Date: 02/17/2022 ? ? ?History of Present Illness Pt is a 56 y.o. 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 ? ?  ?PT Comments  ? ? Pt resting in recliner upon PT arrival; agreeable to PT session. 4-5/10 L knee pain at rest beginning/end of session but pain increased with activity.  During session pt CGA with transfers and ambulation (8 feet and then 20 feet after sitting rest break) with RW use.  Increased effort and time required to take steps d/t L knee pain.  Continue to recommend SNF (d/t limited mobility) but pt wanting to go home (discussed with pt's nurse).  Will continue to focus on strengthening, knee ROM, and progressive functional mobility per pt tolerance. ?   ?Recommendations for follow up therapy are one component of a multi-disciplinary discharge planning process, led by the attending physician.  Recommendations may be updated based on patient status, additional functional criteria and insurance authorization. ? ?Follow Up Recommendations ? Skilled nursing-short term rehab (<3 hours/day) ?  ?  ?Assistance Recommended at Discharge Frequent or constant Supervision/Assistance  ?Patient can return home with the following A little help with bathing/dressing/bathroom;Assistance with cooking/housework;A lot of help with walking and/or transfers;Assist for transportation;Help with stairs or ramp for entrance ?  ?Equipment Recommendations ? None recommended by PT (pt has RW and BSC at home already)  ?  ?Recommendations for Other Services OT consult ? ? ?  ?Precautions / Restrictions Precautions ?Precautions: Fall;Knee ?Precaution Booklet Issued: Yes (comment) ?Restrictions ?Weight  Bearing Restrictions: Yes ?LLE Weight Bearing: Weight bearing as tolerated  ?  ? ?Mobility ? Bed Mobility ?  ?  ?  ?  ?  ?  ?  ?General bed mobility comments: Deferred (pt in recliner beginning/end of session) ?  ? ?Transfers ?Overall transfer level: Needs assistance ?Equipment used: Rolling walker (2 wheels) ?Transfers: Sit to/from Stand ?Sit to Stand: Min guard ?  ?  ?  ?  ?  ?General transfer comment: x2 trials standing from recliner; increased effort to perform on own ?  ? ?Ambulation/Gait ?Ambulation/Gait assistance: Min guard ?Gait Distance (Feet):  (8 feet; 20 feet) ?Assistive device: Rolling walker (2 wheels) ?  ?Gait velocity: decreased ?  ?  ?General Gait Details: antalgic; decreased stance time L LE; vc's to increase UE support through RW to offweight L LE ? ? ?Stairs ?  ?  ?  ?  ?  ? ? ?Wheelchair Mobility ?  ? ?Modified Rankin (Stroke Patients Only) ?  ? ? ?  ?Balance Overall balance assessment: Needs assistance ?Sitting-balance support: No upper extremity supported, Feet supported ?Sitting balance-Leahy Scale: Good ?Sitting balance - Comments: steady sitting reaching within BOS ?  ?Standing balance support: Bilateral upper extremity supported, Reliant on assistive device for balance, During functional activity ?Standing balance-Leahy Scale: Fair ?Standing balance comment: steady static standing with B UE support on RW ?  ?  ?  ?  ?  ?  ?  ?  ?  ?  ?  ?  ? ?  ?Cognition Arousal/Alertness: Awake/alert ?Behavior During Therapy: [facility] for tasks assessed/performed ?Overall Cognitive Status: Within Functional Limits for tasks assessed ?  ?  ?  ?  ?  ?  ?  ?  ?  ?  ?  ?  ?  ?  ?  ?  ?  ?  ?  ? ?  ?  Exercises Total Joint Exercises ?Long Arc Quad: AAROM, Strengthening, Left, 10 reps, Seated ?Knee Flexion: AAROM, Strengthening, Left, 10 reps, Seated ?Goniometric ROM: L knee AROM 10 to 70 degrees ? ?  ?General Comments General comments (skin integrity, edema, etc.): wound vac.  Nursing cleared pt for participation  in physical therapy.  Pt agreeable to PT session. ?  ?  ? ?Pertinent Vitals/Pain Pain Assessment ?Pain Assessment: 0-10 ?Pain Score: 5  ?Pain Location: L knee ?Pain Intervention(s): Limited activity within patient's tolerance, Monitored during session, Premedicated before session, Repositioned, Other (comment) (polar care applied/activated) ?Vitals (HR and O2 on room air) stable and WFL throughout treatment session.  ? ? ?Home Living Family/patient expects to be discharged to:: Private residence ?Living Arrangements: Parent (pt reports adopted mom who is 75) ?Available Help at Discharge: Family;Available 24 hours/day ?Type of Home: House ?Home Access: Ramped entrance ?  ?  ?  ?Home Layout: One level ?Home Equipment: Grab bars - tub/shower;Rolling Walker (2 wheels);Rollator (4 wheels);Cane - single point;BSC/3in1;Shower seat ?Additional Comments: pt has lift chair at home  ?  ?Prior Function    ?  ?  ?   ? ?PT Goals (current goals can now be found in the care plan section) Acute Rehab PT Goals ?Patient Stated Goal: to improve pain and mobility ?PT Goal Formulation: With patient ?Time For Goal Achievement: 03/02/22 ?Potential to Achieve Goals: Good ?Progress towards PT goals: Progressing toward goals ? ?  ?Frequency ? ? ? BID ? ? ? ?  ?PT Plan Current plan remains appropriate  ? ? ?Co-evaluation   ?  ?  ?  ?  ? ?  ?AM-PAC PT "6 Clicks" Mobility   ?Outcome Measure ? Help needed turning from your back to your side while in a flat bed without using bedrails?: A Little ?Help needed moving from lying on your back to sitting on the side of a flat bed without using bedrails?: A Little ?Help needed moving to and from a bed to a chair (including a wheelchair)?: A Little ?Help needed standing up from a chair using your arms (e.g., wheelchair or bedside chair)?: A Little ?Help needed to walk in hospital room?: A Little ?Help needed climbing 3-5 steps with a railing? : Total ?6 Click Score: 16 ? ?  ?End of Session Equipment  Utilized During Treatment: Gait belt ?Activity Tolerance: Patient limited by pain ?Patient left: in chair;with call bell/phone within reach;with SCD's reapplied;Other (comment);with family/visitor present (B heels floating via pillow support) ?Nurse Communication: Mobility status;Precautions;Weight bearing status;Patient requests pain meds ?PT Visit Diagnosis: Other abnormalities of gait and mobility (R26.89);Pain;Muscle weakness (generalized) (M62.81) ?Pain - Right/Left: Left ?Pain - part of body: Knee ?  ? ? ?Time: 1010-1050 ?PT Time Calculation (min) (ACUTE ONLY): 40 min ? ?Charges:  $Gait Training: 8-22 mins ?$Therapeutic Exercise: 8-22 mins ?$Therapeutic Activity: 8-22 mins          ?          ?Leitha Bleak, PT ?02/17/22, 11:25 AM ? ? ?

## 2022-02-17 NOTE — [place] FL2 (Signed)
?[place] MEDICAID FL2 LEVEL OF CARE SCREENING TOOL  ?  ? ?IDENTIFICATION  ?Patient Name: ?Terri Wood Birthdate: [birth year] Sex: female Admission Date (Current Location): ?02/16/2022  ?[place] Number: ? [place] ?  Facility and Address:  ?  ?     Provider Number: ?[id number]  ?Attending Physician Name and Address:  ?Kennedy Bucker, MD ? Relative Name and Phone Number:  ?Sozy Mother, [phone] ?   ?Current Level of Care: ?Hospital Recommended Level of Care: ?Skilled Nursing Facility Prior Approval Number: ?  ? ?Date Approved/Denied: ?  PASRR Number: ?[id number] A ? ?Discharge Plan: ?SNF ?  ? ?Current Diagnoses: ?Patient Active Problem List  ? Diagnosis Date Noted  ? 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 S/P TKR (total knee replacement) using cement, right 07/12/2021  ? Multifocal pneumonia 06/05/2021  ? AKI (acute kidney injury) (HCC) 06/05/2021  ? Borderline hyperglycemia 06/05/2021  ? Generalized weakness 06/05/2021  ? Acute respiratory failure (HCC) 01/16/2021  ? Community acquired pneumonia 01/16/2021  ? CVA, old, hemiparesis (HCC) 01/16/2021  ? Obesity, Class III, BMI 40-49.9 (morbid obesity) (HCC) 01/16/2021  ? Sepsis (HCC) 01/16/2021  ? Depression 01/16/2021  ? Essential hypertension 08/16/2019  ? Fibromyalgia 08/16/2019  ? Hyperlipidemia   ? Smoker   ? Acute CVA (cerebrovascular accident) (HCC) 08/15/2019  ? Avascular necrosis of left femoral head (HCC) 04/09/2018  ? Depression, unspecified 02/02/2015  ? ? ?Orientation RESPIRATION BLADDER Height & Weight   ?  ?Self, Time, Situation, Place ? Normal Continent Weight: 116.6 kg ?Height:  5\' 1"  (154.9 cm)  ?BEHAVIORAL SYMPTOMS/MOOD NEUROLOGICAL BOWEL NUTRITION STATUS  ?    Continent Diet (regular)  ?AMBULATORY STATUS COMMUNICATION OF NEEDS Skin   ?Extensive Assist Verbally Normal, Surgical wounds ?  ?  ?  ?    ?     ?     ? ? ?Personal Care Assistance Level of Assistance  ?Bathing, Feeding, Dressing Bathing Assistance:  Limited assistance ?Feeding assistance: Independent ?Dressing Assistance: Maximum assistance ?   ? ?Functional Limitations Info  ?    ?  ?   ? ? ?SPECIAL CARE FACTORS FREQUENCY  ?PT (By licensed PT), OT (By licensed OT)   ?  ?PT Frequency: 5 times per week ?OT Frequency: 5 times per week ?  ?  ?  ?   ? ? ?Contractures    ? ? ?Additional Factors Info  ?Code Status, Allergies Code Status Info: full code ?Allergies Info: Penicillins, Levofloxacin ?  ?  ?  ?   ? ?Current Medications (02/17/2022):  This is the current hospital active medication list ?Current Facility-Administered Medications  ?Medication Dose Route Frequency Provider Last Rate Last Admin  ? 0.9 %  sodium chloride infusion   Intravenous Continuous 04/19/2022, MD 10 mL/hr at 02/17/22 0808 New Bag at 02/17/22 0808  ? acetaminophen (TYLENOL) tablet 325-650 mg  325-650 mg Oral Q6H PRN 0809, MD      ? alum & mag hydroxide-simeth (MAALOX/MYLANTA) 200-200-20 MG/5ML suspension 30 mL  30 mL Oral Q4H PRN 04-24-2001, MD   30 mL at 02/16/22 1515  ? amLODipine (NORVASC) tablet 5 mg  5 mg Oral Daily 04/18/22, MD   5 mg at 02/17/22 0957  ? aspirin chewable tablet 81 mg  81 mg Oral BID 04/19/22, MD   81 mg at 02/17/22 04/19/22  ? atorvastatin (LIPITOR) tablet 80 mg  80 mg Oral Daily 8242, MD   80 mg at 02/17/22  [id number]  ? bisacodyl (DULCOLAX) EC tablet 5 mg  5 mg Oral Daily PRN Kennedy BuckerMenz, Michael, MD      ? buPROPion (WELLBUTRIN XL) 24 hr tablet 150 mg  150 mg Oral Daily Kennedy BuckerMenz, Michael, MD   150 mg at 02/17/22 0957  ? cholecalciferol (VITAMIN D) tablet 5,000 Units  5,000 Units Oral Daily Kennedy BuckerMenz, Michael, MD   5,000 Units at 02/17/22 [phone]  ? cloNIDine (CATAPRES) tablet 0.1 mg  0.1 mg Oral QHS Kennedy BuckerMenz, Michael, MD   0.1 mg at 02/16/22 2023  ? clopidogrel (PLAVIX) tablet 75 mg  75 mg Oral Daily Kennedy BuckerMenz, Michael, MD   75 mg at 02/17/22 0955  ? docusate sodium (COLACE) capsule 100 mg  100 mg Oral BID Kennedy BuckerMenz, Michael, MD   100 mg at 02/17/22 [id number]  ? DULoxetine  (CYMBALTA) DR capsule 60 mg  60 mg Oral BID Kennedy BuckerMenz, Michael, MD   60 mg at 02/17/22 0955  ? lisinopril (ZESTRIL) tablet 20 mg  20 mg Oral Daily Kennedy BuckerMenz, Michael, MD   20 mg at 02/17/22 0955  ? And  ? hydrochlorothiazide (HYDRODIURIL) tablet 25 mg  25 mg Oral Daily Kennedy BuckerMenz, Michael, MD   25 mg at 02/17/22 0954  ? HYDROmorphone (DILAUDID) injection 0.5-1 mg  0.5-1 mg Intravenous Q4H PRN Kennedy BuckerMenz, Michael, MD   0.5 mg at 02/17/22 0522  ? menthol-cetylpyridinium (CEPACOL) lozenge 3 mg  1 lozenge Oral PRN Kennedy BuckerMenz, Michael, MD      ? Or  ? phenol (CHLORASEPTIC) mouth spray 1 spray  1 spray Mouth/Throat PRN Kennedy BuckerMenz, Michael, MD      ? methocarbamol (ROBAXIN) tablet 500 mg  500 mg Oral Q6H PRN Kennedy BuckerMenz, Michael, MD   500 mg at 02/17/22 [id number]  ? metoCLOPramide (REGLAN) tablet 5-10 mg  5-10 mg Oral Q8H PRN Kennedy BuckerMenz, Michael, MD      ? Or  ? metoCLOPramide (REGLAN) injection 5-10 mg  5-10 mg Intravenous Q8H PRN Kennedy BuckerMenz, Michael, MD      ? multivitamin with minerals tablet 1 tablet  1 tablet Oral Daily Kennedy BuckerMenz, Michael, MD   1 tablet at 02/17/22 [id number]  ? omega-3 acid ethyl esters (LOVAZA) capsule 1,000 mg  1,000 mg Oral Q breakfast Kennedy BuckerMenz, Michael, MD   1,000 mg at 02/17/22 [id number]  ? ondansetron (ZOFRAN) tablet 4 mg  4 mg Oral Q6H PRN Kennedy BuckerMenz, Michael, MD      ? Or  ? ondansetron (ZOFRAN) injection 4 mg  4 mg Intravenous Q6H PRN Kennedy BuckerMenz, Michael, MD      ? oxyCODONE (Oxy IR/ROXICODONE) 5 MG immediate release tablet           ? oxyCODONE (Oxy IR/ROXICODONE) immediate release tablet 10-15 mg  10-15 mg Oral Q4H PRN Kennedy BuckerMenz, Michael, MD   10 mg at 02/17/22 0259  ? oxyCODONE (Oxy IR/ROXICODONE) immediate release tablet 5-10 mg  5-10 mg Oral Q4H PRN Kennedy BuckerMenz, Michael, MD   5 mg at 02/17/22 1005  ? pantoprazole (PROTONIX) EC tablet 40 mg  40 mg Oral Daily Kennedy BuckerMenz, Michael, MD   40 mg at 02/17/22 [id number]  ? polyethylene glycol (MIRALAX / GLYCOLAX) packet 17 g  17 g Oral Daily PRN Kennedy BuckerMenz, Michael, MD      ? sodium phosphate (FLEET) 7-19 GM/118ML enema 1 enema  1 enema Rectal Once PRN Kennedy BuckerMenz,  Michael, MD      ? traMADol Janean Sark(ULTRAM) tablet 50 mg  50 mg Oral Q6H Kennedy BuckerMenz, Michael, MD   50 mg at 02/17/22 1150  ? vortioxetine HBr (TRINTELLIX) tablet  5 mg  5 mg Oral Daily Kennedy Bucker, MD   5 mg at 02/17/22 3329  ? zolpidem (AMBIEN) tablet 5 mg  5 mg Oral QHS PRN Kennedy Bucker, MD   5 mg at 02/16/22 2124  ? ? ? ?Discharge Medications: ?Please see discharge summary for a list of discharge medications. ? ?Relevant Imaging Results: ? ?Relevant Lab Results: ? ? ?Additional Information ?SS# [id number] ? ?Marlowe Sax, RN ? ? ? ? ?

## 2022-02-17 NOTE — Evaluation (Signed)
Occupational Therapy Evaluation ?Patient Details ?Name: Terri Wood ?MRN: [id number] ?DOB: 24-Aug-1966 ?Today's Date: 02/17/2022 ? ? ?History of Present Illness Pt is a 56 y.o. 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  ? ?Clinical Impression ?  ?Chart reviewed RN cleared pt for participation in OT tx session. PTA pt reports she was MOD I for ADL, amb with I with intermittent use of SPC.   Pt instructed in polar care mgt, falls prevention strategies, home/routines modifications, DME/AE for LB bathing and dressing tasks, and compression stocking mgt. Min A required for bed mobility, STS with CGA- MIN A with RW, short abm transfer to bedside chair with CGA, amb approx 4 feet 2x with chair follow with close supervision with RW.  Pt would benefit from skilled OT services including additional instruction in dressing techniques with or without assistive devices for dressing and bathing skills to support recall and carryover prior to discharge and ultimately to maximize safety, independence, and minimize falls risk and caregiver burden. OT will follow acutely.   ? ?Recommendations for follow up therapy are one component of a multi-disciplinary discharge planning process, led by the attending physician.  Recommendations may be updated based on patient status, additional functional criteria and insurance authorization.  ? ?Follow Up Recommendations ? Skilled nursing-short term rehab (<3 hours/day)  ?  ?Assistance Recommended at Discharge Intermittent Supervision/Assistance  ?Patient can return home with the following A lot of help with bathing/dressing/bathroom;A little help with walking and/or transfers;Help with stairs or ramp for entrance ? ?  ?Functional Status Assessment ? Patient has had a recent  decline in their functional status and demonstrates the ability to make significant improvements in function in a reasonable and predictable amount of time.  ?Equipment Recommendations ? None recommended by OT;Other (comment) (pt has bsc and shower chair)  ?  ?Recommendations for Other Services   ? ? ?  ?Precautions / Restrictions Precautions ?Precautions: Fall;Knee ?Restrictions ?Weight Bearing Restrictions: Yes ?LLE Weight Bearing: Weight bearing as tolerated  ? ?  ? ?Mobility Bed Mobility ?Overal bed mobility: Needs Assistance ?Bed Mobility: Supine to Sit ?  ?  ?Supine to sit: HOB elevated, Min assist ?  ?  ?General bed mobility comments: increased time, assist for LLE ?  ? ?Transfers ?Overall transfer level: Needs assistance ?Equipment used: Rolling walker (2 wheels) ?  ?Sit to Stand: Min assist, Min guard ?  ?  ?  ?  ?  ?General transfer comment: MIN A 2 attempts, CGA 2 attempts with RW; vcs for technique ?  ? ?  ?Balance Overall balance assessment: Needs assistance ?Sitting-balance support: No upper extremity supported, Feet supported ?Sitting balance-Leahy Scale: Good ?  ?  ?Standing balance support: Bilateral upper extremity supported, Reliant on assistive device for balance, During functional activity ?Standing balance-Leahy Scale: Fair ?  ?  ?  ?  ?  ?  ?  ?  ?  ?  ?  ?  ?   ? ?ADL either performed or assessed with clinical judgement  ? ?ADL Overall ADL's : Needs assistance/impaired ?Eating/Feeding: Set up;Sitting ?  ?Grooming: Wash/dry face;Sitting;Set up ?Grooming Details (indicate cue type and reason): anticipated ?  ?  ?  ?  ?Upper Body Dressing : Minimal assistance;Sitting ?Upper Body Dressing Details (indicate cue type and reason): anticipated ?  Lower Body Dressing: Maximal assistance ?Lower Body Dressing Details (indicate cue type and reason): socks, educated on use of AE ?Toilet Transfer: Supervision/safety;Min guard;Stand-pivot;Rolling walker (2 wheels) ?Toilet Transfer Details (indicate cue type  and reason): short amb transfer ?  ?  ?  ?  ?Functional mobility during ADLs: Supervision/safety;Min guard;Rolling walker (2 wheels) (approx 4 feet 2x) ?   ? ? ? ?Vision Patient Visual Report: No change from baseline ?   ?   ?Perception   ?  ?Praxis   ?  ? ?Pertinent Vitals/Pain Pain Assessment ?Pain Assessment: 0-10 ?Pain Score: 5  ?Pain Descriptors / Indicators: Aching, Tender, Sore, Guarding ?Pain Intervention(s): Limited activity within patient's tolerance, Monitored during session, Repositioned  ? ? ? ?Hand Dominance   ?  ?Extremity/Trunk Assessment Upper Extremity Assessment ?Upper Extremity Assessment: LUE deficits/detail ?LUE Deficits / Details: baseline LUE deficits from previous stroke- LUE grip strength/FMC impaired; LUE shoulder, elbow, wrist appear 4/5 strength ?  ?Lower Extremity Assessment ?Lower Extremity Assessment: Defer to PT evaluation;LLE deficits/detail ?LLE Deficits / Details: s/p L TKA ?  ?  ?  ?Communication Communication ?Communication: No difficulties ?  ?Cognition Arousal/Alertness: Awake/alert ?[facility] for tasks assessed/performed ?Overall Cognitive Status: Within Functional Limits for tasks assessed ?  ?  ?  ?  ?  ?  ?  ?  ?  ?  ?  ?  ?  ?  ?  ?  ?  ?  ?  ?General Comments  wound vac ? ?  ?Exercises   ?  ?Shoulder Instructions    ? ? ?Home Living Family/patient expects to be discharged to:: Private residence ?Living Arrangements: Parent (pt reports adopted mom who is [phone]) ?Available Help at Discharge: Family;Available 24 hours/day ?Type of Home: House ?Home Access: Ramped entrance ?  ?  ?Home Layout: One level ?  ?  ?Bathroom Shower/Tub: Tub/shower unit ?  ?Bathroom Toilet: Standard ?  ?  ?Home Equipment: Grab bars - tub/shower;Rolling Walker (2 wheels);Rollator (4 wheels);Cane - single point;BSC/3in1;Shower seat ?  ?Additional Comments: pt has lift chair at home ?  ? ?  ?Prior Functioning/Environment Prior Level of Function : Independent/Modified Independent ?  ?  ?   ?  ?  ?  ?Mobility Comments: ind with amb, PRN use of cane ?ADLs Comments: MOD I-I in ADL/IADL PTA ?  ? ?  ?  ?OT Problem List: Decreased strength;Decreased activity tolerance ?  ?   ?OT Treatment/Interventions: Self-care/ADL training;Therapeutic exercise;Energy conservation;DME and/or AE instruction  ?  ?OT Goals(Current goals can be found in the care plan section) Acute Rehab OT Goals ?Patient Stated Goal: go home ?OT Goal Formulation: With patient ?Time For Goal Achievement: 03/03/22 ?Potential to Achieve Goals: Good ?ADL Goals ?Pt Will Perform Grooming: with modified independence;sitting;standing ?Pt Will Perform Lower Body Dressing: with modified independence;with adaptive equipment;sit to/from stand ?Pt Will Transfer to Toilet: with modified independence;ambulating;bedside commode ?Pt Will Perform Toileting - Clothing Manipulation and hygiene: with modified independence;sit to/from stand  ?OT Frequency: Min 2X/week ?  ? ?Co-evaluation   ?  ?  ?  ?  ? ?  ?AM-PAC OT "6 Clicks" Daily Activity     ?Outcome Measure Help from another person eating meals?: None ?Help from another person taking care of personal grooming?: None ?Help from another person toileting, which includes using toliet, bedpan, or urinal?: A Little ?Help from another person bathing (including washing, rinsing, drying)?: A Lot ?Help from another person to put on and taking off regular upper body  clothing?: A Little ?Help from another person to put on and taking off regular lower body clothing?: A Lot ?6 Click Score: 18 ?  ?End of Session Equipment Utilized During Treatment: Gait belt;Rolling walker (2 wheels) ?Nurse Communication: Mobility status ? ?Activity Tolerance: Patient tolerated treatment well ?Patient left: in chair;with call bell/phone within reach ? ?OT Visit Diagnosis: Unsteadiness on feet (R26.81);Muscle weakness (generalized) (M62.81)  ?              ?Time: 8413-2440 ?OT Time Calculation (min): 36 min ?Charges:  OT General  Charges ?$OT Visit: 1 Visit ?OT Evaluation ?$OT Eval Moderate Complexity: 1 Mod ?OT Treatments ?$Self Care/Home Management : 8-22 mins ? ?Oleta Mouse, OTD OTR/L  ?02/17/22, 10:22 AM  ?

## 2022-02-17 NOTE — Discharge Instructions (Addendum)
TOTAL KNEE REPLACEMENT POSTOPERATIVE DIRECTIONS ? ?Knee Rehabilitation, Guidelines Following Surgery  ?Results after knee surgery are often greatly improved when you follow the exercise, range of motion and muscle strengthening exercises prescribed by your doctor. Safety measures are also important to protect the knee from further injury. Any time any of these exercises cause you to have increased pain or swelling in your knee joint, decrease the amount until you are comfortable again and slowly increase them. If you have problems or questions, call your caregiver or physical therapist for advice.  ? ?HOME CARE INSTRUCTIONS  ?Remove items at home which could result in a fall. This includes throw rugs or furniture in walking pathways.  ?ICE using the Polar Care unit to the affected knee every three hours for 30 minutes at a time and then as needed for pain and swelling.  Place a dry towel or pillow case over the knee before applying the Polar Care Unit.  Continue to use ice on the knee for pain and swelling from surgery. You may notice swelling that will progress down to the foot and ankle.  This is normal after surgery.  Elevate the leg when you are not up walking on it.   ?Continue to use the breathing machine which will help keep your temperature down.  It is common for your temperature to cycle up and down following surgery, especially at night when you are not up moving around and exerting yourself.  The breathing machine keeps your lungs expanded and your temperature down. ?Do not place pillow under knee, focus on keeping the knee straight while resting ? ?DIET ?You may resume your previous home diet once your are discharged from the hospital. ? ?DRESSING / WOUND CARE / SHOWERING ?Please remove provena negative pressure dressing on 02/27/2022 and apply honey comb dressing. Keep dressing clean and dry at all times.  ? ?ACTIVITY ?Walk with your walker as instructed. ?Use walker as long as suggested by your  caregivers. ?Avoid periods of inactivity such as sitting longer than an hour when not asleep. This helps prevent blood clots.  ?You may resume a sexual relationship in one month or when given the OK by your doctor.  ?You may return to work once you are cleared by your doctor.  ?Do not drive a car for 6 weeks or until released by you surgeon.  ?Do not drive while taking narcotics. ? ?WEIGHT BEARING ?Weight bearing as tolerated with assist device (walker, cane, etc) as directed, use it as long as suggested by your surgeon or therapist, typically at least 4-6 weeks. ? ?POSTOPERATIVE CONSTIPATION PROTOCOL ?Constipation - defined medically as fewer than three stools per week and severe constipation as less than one stool per week. ? ?One of the most common issues patients have following surgery is constipation.  Even if you have a regular bowel pattern at home, your normal regimen is likely to be disrupted due to multiple reasons following surgery.  Combination of anesthesia, postoperative narcotics, change in appetite and fluid intake all can affect your bowels.  In order to avoid complications following surgery, here are some recommendations in order to help you during your recovery period. ? ?Colace (docusate) - Pick up an over-the-counter form of Colace or another stool softener and take twice a day as long as you are requiring postoperative pain medications.  Take with a full glass of water daily.  If you experience loose stools or diarrhea, hold the colace until you stool forms back up.  If your  symptoms do not get better within 1 week or if they get worse, check with your doctor. ? ?Dulcolax (bisacodyl) - Pick up over-the-counter and take as directed by the product packaging as needed to assist with the movement of your bowels.  Take with a full glass of water.  Use this product as needed if not relieved by Colace only.  ? ?MiraLax (polyethylene glycol) - Pick up over-the-counter to have on hand.  MiraLax is a  solution that will increase the amount of water in your bowels to assist with bowel movements.  Take as directed and can mix with a glass of water, juice, soda, coffee, or tea.  Take if you go more than two days without a movement. ?Do not use MiraLax more than once per day. Call your doctor if you are still constipated or irregular after using this medication for 7 days in a row. ? ?If you continue to have problems with postoperative constipation, please contact the office for further assistance and recommendations.  If you experience "the worst abdominal pain ever" or develop nausea or vomiting, please contact the office immediatly for further recommendations for treatment. ? ?ITCHING ? If you experience itching with your medications, try taking only a single pain pill, or even half a pain pill at a time.  You can also use Benadryl over the counter for itching or also to help with sleep.  ? ?TED HOSE STOCKINGS ?Wear the elastic stockings on both legs for six weeks following surgery during the day but you may remove then at night for sleeping. ? ?MEDICATIONS ?See your medication summary on the ?After Visit Summary? that the nursing staff will review with you prior to discharge.  You may have some home medications which will be placed on hold until you complete the course of blood thinner medication.  It is important for you to complete the blood thinner medication as prescribed by your surgeon.  Continue your approved medications as instructed at time of discharge. ? ?PRECAUTIONS ?If you experience chest pain or shortness of breath - call 911 immediately for transfer to the hospital emergency department.  ?If you develop a fever greater that 101 F, purulent drainage from wound, increased redness or drainage from wound, foul odor from the wound/dressing, or calf pain - CONTACT YOUR SURGEON.   ?                                                ?FOLLOW-UP APPOINTMENTS ?Make sure you keep all of your appointments after  your operation with your surgeon and caregivers. You should call the office at the above phone number and make an appointment for approximately two weeks after the date of your surgery or on the date instructed by your surgeon outlined in the "After Visit Summary". ? ? ?RANGE OF MOTION AND STRENGTHENING EXERCISES  ?Rehabilitation of the knee is important following a knee injury or an operation. After just a few days of immobilization, the muscles of the thigh which control the knee become weakened and shrink (atrophy). Knee exercises are designed to build up the tone and strength of the thigh muscles and to improve knee motion. Often times heat used for twenty to thirty minutes before working out will loosen up your tissues and help with improving the range of motion but do not use heat for the first two weeks  following surgery. These exercises can be done on a training (exercise) mat, on the floor, on a table or on a bed. Use what ever works the best and is most comfortable for you Knee exercises include:  ?Leg Lifts - While your knee is still immobilized in a splint or cast, you can do straight leg raises. Lift the leg to 60 degrees, hold for 3 sec, and slowly lower the leg. Repeat 10-20 times 2-3 times daily. Perform this exercise against resistance later as your knee gets better.  ?Quad and Hamstring Sets - Tighten up the muscle on the front of the thigh (Quad) and hold for 5-10 sec. Repeat this 10-20 times hourly. Hamstring sets are done by pushing the foot backward against an object and holding for 5-10 sec. Repeat as with quad sets.  ?Leg Slides: Lying on your back, slowly slide your foot toward your buttocks, bending your knee up off the floor (only go as far as is comfortable). Then slowly slide your foot back down until your leg is flat on the floor again. ?Angel Wings: Lying on your back spread your legs to the side as far apart as you can without causing discomfort.  ?A rehabilitation program following  serious knee injuries can speed recovery and prevent re-injury in the future due to weakened muscles. Contact your doctor or a physical therapist for more information on knee rehabilitation.  ? ?IF YOU ARE TRANSF

## 2022-02-17 NOTE — Progress Notes (Signed)
Physical Therapy Treatment ?Patient Details ?Name: Terri Wood ?MRN: [id number] ?DOB: 11/08/1965 ?Today's Date: 02/17/2022 ? ? ?History of Present Illness Pt is a 56 y.o. 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 ? ?  ?PT Comments  ? ? Pt was long sitting in bed with supportive friend/caregiver at bedside. She agrees to PT session and is endorsing less pain than earlier in the day. Premedicated prior to session. She does continue to require assistance to exit bed, stand and ambulate. Currently acute PT recommending DC to SNF however pt is progressing and may be able to progress to home with HHPT if able to perform better in AM session. Author will return in the morning and continue to follow per current POC. ?  ?Recommendations for follow up therapy are one component of a multi-disciplinary discharge planning process, led by the attending physician.  Recommendations may be updated based on patient status, additional functional criteria and insurance authorization. ? ?Follow Up Recommendations ? Skilled nursing-short term rehab (<3 hours/day) ?  ?  ?Assistance Recommended at Discharge Frequent or constant Supervision/Assistance  ?Patient can return home with the following A little help with bathing/dressing/bathroom;Assistance with cooking/housework;A lot of help with walking and/or transfers;Assist for transportation;Help with stairs or ramp for entrance ?  ?Equipment Recommendations ? None recommended by PT (pt has previous knee done ~ 6 months prior. All equipment needs are met)  ?  ?Recommendations for Other Services OT consult ? ? ?  ?Precautions / Restrictions Precautions ?Precautions: Fall;Knee ?Precaution Booklet Issued: Yes (comment) ?Restrictions ?Weight Bearing Restrictions: Yes ?LLE  Weight Bearing: Weight bearing as tolerated  ?  ? ?Mobility ? Bed Mobility ?Overal bed mobility: Needs Assistance ?Bed Mobility: Supine to Sit ?  ?  ?Supine to sit: Min assist, Mod assist, HOB elevated ?  ?  ?General bed mobility comments: pt required min-mod assist of one to exit bed. Required assistance with progressing LLE ?  ? ?Transfers ?Overall transfer level: Needs assistance ?Equipment used: Rolling walker (2 wheels) ?Transfers: Sit to/from Stand ?Sit to Stand: Min guard ?  ?   ?General transfer comment: CGA for safety ?  ? ?Ambulation/Gait ?Ambulation/Gait assistance: Min guard, Supervision ?Gait Distance (Feet): 80 Feet ?Assistive device: Rolling walker (2 wheels) ?Gait Pattern/deviations: Step-to pattern, Antalgic ?Gait velocity: decreased ?  ?  ?General Gait Details: extremely slow cadance. She required vcs for posture correction and improved sequencing ? ? ? ?  ?Balance Overall balance assessment: Needs assistance ?Sitting-balance support: No upper extremity supported, Feet supported ?Sitting balance-Leahy Scale: Good ?  ?  ?Standing balance support: Bilateral upper extremity supported, Reliant on assistive device for balance, During functional activity ?Standing balance-Leahy Scale: Good ?  ?  ?  ?Cognition Arousal/Alertness: Awake/alert ?Behavior During Therapy: [facility] for tasks assessed/performed ?Overall Cognitive Status: Within Functional Limits for tasks assessed ?  ? General Comments: Pt is A and O x 4. slightly "loopy" from pain meds but overall endorsing much less pain ?  ?  ? ?  ?   ?General Comments General comments (skin integrity, edema, etc.): pt required re-educated on importance of positioning, ice, and need to continue to perform HEP to promote strength and improved ROM ?  ?  ? ?Pertinent Vitals/Pain Pain Assessment ?Pain Assessment: 0-10 ?Pain Score: 7  ?Pain Location: L knee ?  Pain Descriptors / Indicators: Aching, Tender, Sore, Guarding ?Pain Intervention(s): Monitored during session,  Limited activity within patient's tolerance, Premedicated before session, Repositioned, Ice applied  ? ? ? ?PT Goals (current goals can now be found in the care plan section) Acute Rehab PT Goals ?Patient Stated Goal: get better and go to my friends house ?PT Goal Formulation: With patient ?Time For Goal Achievement: 03/02/22 ?Potential to Achieve Goals: Good ?Progress towards PT goals: Progressing toward goals ? ?  ?Frequency ? ? ? BID ? ? ? ?  ?PT Plan Current plan remains appropriate  ? ? ?   ?AM-PAC PT "6 Clicks" Mobility   ?Outcome Measure ? Help needed turning from your back to your side while in a flat bed without using bedrails?: A Little ?Help needed moving from lying on your back to sitting on the side of a flat bed without using bedrails?: A Little ?Help needed moving to and from a bed to a chair (including a wheelchair)?: A Little ?Help needed standing up from a chair using your arms (e.g., wheelchair or bedside chair)?: A Little ?Help needed to walk in hospital room?: A Little ?Help needed climbing 3-5 steps with a railing? : A Lot ?6 Click Score: 17 ? ?  ?End of Session Equipment Utilized During Treatment: Gait belt ?Activity Tolerance: Patient tolerated treatment well ?Patient left: in bed;with call bell/phone within reach;with bed alarm set;with family/visitor present;with nursing/sitter in room ?Nurse Communication: Mobility status;Precautions;Weight bearing status;Patient requests pain meds ?PT Visit Diagnosis: Other abnormalities of gait and mobility (R26.89);Pain;Muscle weakness (generalized) (M62.81) ?Pain - Right/Left: Left ?Pain - part of body: Knee ?  ? ? ?Time: 4799-8721 ?PT Time Calculation (min) (ACUTE ONLY): 32 min ? ?Charges:  $Gait Training: 8-22 mins ?$Therapeutic Exercise: 8-22 mins ?$Therapeutic Activity: 8-22 mins          ?          ?Julaine Fusi PTA ?02/17/22, 3:02 PM  ? ?

## 2022-02-18 LAB — CBC
HCT: 35.5 % — ABNORMAL LOW (ref 36.0–46.0)
Hemoglobin: 11.2 g/dL — ABNORMAL LOW (ref 12.0–15.0)
MCH: 29.4 pg (ref 26.0–34.0)
MCHC: 31.5 g/dL (ref 30.0–36.0)
MCV: 93.2 fL (ref 80.0–100.0)
Platelets: 284 10*3/uL (ref 150–400)
RBC: 3.81 MIL/uL — ABNORMAL LOW (ref 3.87–5.11)
RDW: 15.2 % (ref 11.5–15.5)
WBC: 11.2 10*3/uL — ABNORMAL HIGH (ref 4.0–10.5)
nRBC: 0 % (ref 0.0–0.2)

## 2022-02-18 NOTE — Progress Notes (Signed)
Patients pain is not well controlled. Had to give patient several prn medications. Patient does not feel comfortable going home today, she lives with her 47yr old mother who cannot help take care of her.   ?Patient also refused foam bone, patient educated. No other complaints at this time. ? ?

## 2022-02-18 NOTE — Progress Notes (Signed)
Physical Therapy Treatment ?Patient Details ?Name: Terri Wood ?MRN: [id number] ?DOB: 1966/07/24 ?Today's Date: 02/18/2022 ? ? ?History of Present Illness Pt is a 56 y.o. 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 ? ?  ?PT Comments  ? ? Pt was sitting in recliner, requesting to return to bed. Prior to standing and returning to bed, pt perform AAROM. Does have ~ 75 degrees active knee flexion after stretching. Re-educated pt on importance of ROM and strengthening for return to PLOF. She states understanding. She then stood and took several steps back to EOB with CGA for safety. Pt's caregiver at DC was present throughout session and both pt/caregiver feel they will be able to safely manage at home tomorrow. Pt did perform HEP and was encouraged to perform several more times throughout the day. Polar care was reapplied, with towel roll placed under heel to promote knee extension. PT will return in the morning prior to Dcing home if cleared medically.  ?  ?Recommendations for follow up therapy are one component of a multi-disciplinary discharge planning process, led by the attending physician.  Recommendations may be updated based on patient status, additional functional criteria and insurance authorization. ? ?Follow Up Recommendations ? Home health PT ?  ?  ?Assistance Recommended at Discharge Intermittent Supervision/Assistance  ?Patient can return home with the following A little help with bathing/dressing/bathroom;Assistance with cooking/housework;A lot of help with walking and/or transfers;Assist for transportation;Help with stairs or ramp for entrance ?  ?Equipment Recommendations ? None recommended by PT  ?  ?   ?Precautions / Restrictions Precautions ?Precautions: Fall;Knee ?Precaution  Booklet Issued: Yes (comment) ?Restrictions ?Weight Bearing Restrictions: Yes ?LLE Weight Bearing: Weight bearing as tolerated  ?  ? ?Mobility ? Bed Mobility ?Overal bed mobility: Needs Assistance ?Bed Mobility: Sit to Supine ?  ?  ?Supine to sit: Supervision ?Sit to supine: Min assist ?  ?General bed mobility comments: pt continues to require some assistance to get out/in bed however is planning to sleep in lift chair at DC ?  ? ?Transfers ?Overall transfer level: Needs assistance ?Equipment used: Rolling walker (2 wheels) ?Transfers: Sit to/from Stand ?Sit to Stand: Min guard ?  ?  ?  ?  ?  ?General transfer comment: Increased time to perform with vcs for fwd wt shift and improved overall technique ?  ? ?Ambulation/Gait ?Ambulation/Gait assistance: Supervision ?Gait Distance (Feet): 5 Feet ?Assistive device: Rolling walker (2 wheels) ?Gait Pattern/deviations: Step-to pattern, Antalgic ?Gait velocity: extremely slow ?  ?  ?General Gait Details: from recliner to bed ? ?  ?Balance Overall balance assessment: Needs assistance ?Sitting-balance support: No upper extremity supported, Feet supported ?Sitting balance-Leahy Scale: Good ?  ?  ?Standing balance support: Bilateral upper extremity supported, Reliant on assistive device for balance, During functional activity ?Standing balance-Leahy Scale: Good ?  ?  ?  ?Cognition Arousal/Alertness: Awake/alert ?Behavior During Therapy: [facility] for tasks assessed/performed ?Overall Cognitive Status: Within Functional Limits for tasks assessed ?   ?General Comments: pt is a little "loopy" from medication but overall is A and O x 4. endorses 7/10 pain however reports its "just sore" ?  ?  ? ?  ?Exercises Total Joint Exercises ?Ankle Circles/Pumps: AROM, Strengthening, Both, 10 reps, Supine ?Quad Sets: AROM, Strengthening, Left, 10 reps, Supine ?Heel Slides: AAROM, Strengthening,  Left, 10 reps, Supine ?Hip ABduction/ADduction: AAROM, Strengthening, Left, 10 reps, Supine ?Straight Leg  Raises: AAROM, Strengthening, Left, 10 reps, Supine ?Goniometric ROM: ~75 degrees flexion after AAROM perform ? ?  ?   ? ?Pertinent Vitals/Pain Pain Assessment ?Pain Assessment: 0-10 ?Pain Score: 8  ?Pain Location: L knee ?Pain Descriptors / Indicators: Aching, Tender, Sore, Guarding ?Pain Intervention(s): Limited activity within patient's tolerance, Monitored during session, Premedicated before session, Repositioned, Ice applied  ? ? ? ?PT Goals (current goals can now be found in the care plan section) Acute Rehab PT Goals ?Patient Stated Goal: less pain ?Progress towards PT goals: Progressing toward goals ? ?  ?Frequency ? ? ? BID ? ? ? ?  ?PT Plan Current plan remains appropriate  ? ? ?   ?AM-PAC PT "6 Clicks" Mobility   ?Outcome Measure ? Help needed turning from your back to your side while in a flat bed without using bedrails?: A Little ?Help needed moving from lying on your back to sitting on the side of a flat bed without using bedrails?: A Little ?Help needed moving to and from a bed to a chair (including a wheelchair)?: A Little ?Help needed standing up from a chair using your arms (e.g., wheelchair or bedside chair)?: A Little ?Help needed to walk in hospital room?: A Little ?Help needed climbing 3-5 steps with a railing? : A Little ?6 Click Score: 18 ? ?  ?End of Session   ?Activity Tolerance: Patient tolerated treatment well ?Patient left: in bed;with call bell/phone within reach;with bed alarm set;with family/visitor present ?Nurse Communication: Mobility status;Precautions;Weight bearing status;Patient requests pain meds ?PT Visit Diagnosis: Other abnormalities of gait and mobility (R26.89);Pain;Muscle weakness (generalized) (M62.81) ?Pain - Right/Left: Left ?Pain - part of body: Knee ?  ? ? ?Time: 2979-8921 ?PT Time Calculation (min) (ACUTE ONLY): 23 min ? ?Charges:  $Therapeutic Exercise: 8-22 mins ?$Therapeutic Activity: 8-22 mins          ?          ?Jetta Lout PTA ?02/18/22, 3:51 PM  ? ?

## 2022-02-18 NOTE — Progress Notes (Signed)
?  Subjective: ?2 Days Post-Op Procedure(s) (LRB): ?TOTAL KNEE ARTHROPLASTY (Left) ?Patient reports pain as moderate.   ?Patient is well, and has had no acute complaints or problems ?Plan is to go Home after hospital stay. ?Negative for chest pain and shortness of breath ?Fever: no ?Gastrointestinal: negative for nausea and vomiting.  Patient has had a bowel movement. ? ?Objective: ?Vital signs in last 24 hours: ?Temp:  [97.6 ?F (36.4 ?C)-98.1 ?F (36.7 ?C)] 97.6 ?F (36.4 ?C) (05/06 8938) ?Pulse Rate:  [88-97] 96 (05/06 0758) ?Resp:  [16-18] 18 (05/06 0758) ?BP: (113-156)/(59-122) 154/122 (05/06 0758) ?SpO2:  [96 %-100 %] 100 % (05/06 0758) ? ?Intake/Output from previous day: ? ?Intake/Output Summary (Last 24 hours) at 02/18/2022 0957 ?Last data filed at 02/18/2022 1017 ?Gross per 24 hour  ?Intake 516.34 ml  ?Output 0 ml  ?Net 516.34 ml  ?  ?Intake/Output this shift: ?No intake/output data recorded. ? ?Labs: ?Recent Labs  ?  02/17/22 ?0319 02/18/22 ?0402  ?HGB 11.8* 11.2*  ? ?Recent Labs  ?  02/17/22 ?0319 02/18/22 ?0402  ?WBC 11.0* 11.2*  ?RBC 3.96 3.81*  ?HCT 37.9 35.5*  ?PLT 297 284  ? ?Recent Labs  ?  02/17/22 ?0319  ?NA 133*  ?K 4.4  ?CL 99  ?CO2 28  ?BUN 17  ?CREATININE 0.92  ?GLUCOSE 175*  ?CALCIUM 8.4*  ? ?No results for input(s): LABPT, INR in the last 72 hours. ? ? ?EXAM ?General - Patient is Alert, Appropriate, and Oriented ?Extremity - Neurovascular intact ?Dorsiflexion/Plantar flexion intact ?Compartment soft ?Dressing/Incision -Prevena in place, no drainage in cannister  ?Motor Function - intact, moving foot and toes well on exam.  ?Cardiovascular- Regular rate and rhythm, no murmurs/rubs/gallops ?Respiratory- Lungs clear to auscultation bilaterally ?Gastrointestinal- soft and nontender ? ? ?Assessment/Plan: ?2 Days Post-Op Procedure(s) (LRB): ?TOTAL KNEE ARTHROPLASTY (Left) ?Principal Problem: ?  S/P TKR (total knee replacement) using cement, left ? ?Estimated body mass index is 48.57 kg/m? as calculated  from the following: ?  Height as of this encounter: 5\' 1"  (1.549 m). ?  Weight as of this encounter: 116.6 kg. ?Advance diet ?Up with therapy ? ?Spoke with patient about likely d/c tomorrow. She is not comfortable going home today and is concerned about being a burden to her 33 yr old mother.  ? ? ? ?DVT Prophylaxis - ASA, Plavix ?Weight-Bearing as tolerated to left leg ? ?96, PA-C ?[facility] ?02/18/2022, 9:57 AM ? ?

## 2022-02-18 NOTE — Progress Notes (Signed)
Physical Therapy Treatment ?Patient Details ?Name: Terri Wood ?MRN: [id number] ?DOB: 05-18-1966 ?Today's Date: 02/18/2022 ? ? ?History of Present Illness Pt is a 56 y.o. 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 ? ?  ?PT Comments  ? ? Pt was long sitting in bed upon arriving. She endorses 7/10 pain however pain did not limit session progression. Pain just impacts cadence of task performed. She was able to use bed sheet to assist LLE OOB to floor. Stood to [employer] and ambulated 70 ft with extremely slow, antalgic gait kinematics. No LOB or safety concern during gait. Vcs throughout for improved LLE foot clearance and improved lateral wt shift to allow opposite LE progression. Pt overall tolerated session well and continues to be improving. DC recs updated to home with HHPT. Per discussion with PA, plan to DC home tomorrow with supportive friend that pt calls her mother. She has all equipment needs met. Author will return this afternoon to progress ROM, strength, and activity tolerance. ?  ?Recommendations for follow up therapy are one component of a multi-disciplinary discharge planning process, led by the attending physician.  Recommendations may be updated based on patient status, additional functional criteria and insurance authorization. ? ?Follow Up Recommendations ? Home health PT ?  ?  ?Assistance Recommended at Discharge Intermittent Supervision/Assistance  ?Patient can return home with the following A little help with bathing/dressing/bathroom;Assistance with cooking/housework;A lot of help with walking and/or transfers;Assist for transportation;Help with stairs or ramp for entrance ?  ?Equipment Recommendations ? None recommended by PT (pt has all equipment needs met)  ?  ?   ?Precautions  / Restrictions Precautions ?Precautions: Fall;Knee ?Precaution Booklet Issued: Yes (comment) ?Restrictions ?Weight Bearing Restrictions: Yes ?LLE Weight Bearing: Weight bearing as tolerated  ?  ? ?Mobility ? Bed Mobility ?Overal bed mobility: Needs Assistance ?Bed Mobility: Supine to Sit ?  ?  ?Supine to sit: Supervision ?  ?  ?General bed mobility comments: pt used bed sheet to assist LLE OOB to floor. recommended to use her dog leash at home to assist LE until strength/pain improves to where she no longer needs use of UEs to progress LLE. She plans to sleep in lift chair at DC. ?  ? ?Transfers ?Overall transfer level: Needs assistance ?Equipment used: Rolling walker (2 wheels) ?Transfers: Sit to/from Stand ?Sit to Stand: Min guard ?  ?  ?  ?  ?  ?General transfer comment: CGA for safety. vcs for increased fwd wt shift. good eccentric control with stand > sit ?  ? ?Ambulation/Gait ?Ambulation/Gait assistance: Supervision ?Gait Distance (Feet): 70 Feet ?Assistive device: Rolling walker (2 wheels) ?Gait Pattern/deviations: Step-to pattern, Antalgic ?Gait velocity: extremely slow ?  ?  ?General Gait Details: pt ambulated with extremely slow, step to gait pattern. no LOB or assistance however vcs for increased foot clearance and lateral wt shift. ? ?  ?Balance Overall balance assessment: Needs assistance ?Sitting-balance support: No upper extremity supported, Feet supported ?Sitting balance-Leahy Scale: Good ?  ?  ?Standing balance support: Bilateral upper extremity supported, Reliant on assistive device for balance, During functional activity ?Standing balance-Leahy Scale: Good ?  ?  ?  ?Cognition Arousal/Alertness: Awake/alert ?Behavior During Therapy: [facility] for tasks assessed/performed ?Overall Cognitive Status: Within Functional Limits for tasks assessed ?  ?   ?General  Comments: pt is a little "loopy" from medication but overall is A and O x 4. endorses 7/10 pain however reports its "just sore" ?  ?  ? ?  ?    ?General Comments General comments (skin integrity, edema, etc.): will address ROM, strength, and HEP next session this afternoon ?  ?  ? ?Pertinent Vitals/Pain Pain Assessment ?Pain Assessment: 0-10 ?Pain Score: 7  ?Pain Location: L knee ?Pain Descriptors / Indicators: Aching, Tender, Sore, Guarding ?Pain Intervention(s): Limited activity within patient's tolerance, Monitored during session, Premedicated before session, Repositioned, Ice applied  ? ? ? ?PT Goals (current goals can now be found in the care plan section) Acute Rehab PT Goals ?Patient Stated Goal: less pain ?Progress towards PT goals: Progressing toward goals ? ?  ?Frequency ? ? ? BID ? ? ? ?  ?PT Plan Discharge plan needs to be updated  ? ? ?   ?AM-PAC PT "6 Clicks" Mobility   ?Outcome Measure ? Help needed turning from your back to your side while in a flat bed without using bedrails?: A Little ?Help needed moving from lying on your back to sitting on the side of a flat bed without using bedrails?: A Little ?Help needed moving to and from a bed to a chair (including a wheelchair)?: A Little ?Help needed standing up from a chair using your arms (e.g., wheelchair or bedside chair)?: A Little ?Help needed to walk in hospital room?: A Little ?Help needed climbing 3-5 steps with a railing? : A Little ?6 Click Score: 18 ? ?  ?End of Session   ?Activity Tolerance: Patient tolerated treatment well ?Patient left: in chair;with call bell/phone within reach ?Nurse Communication: Mobility status;Precautions;Weight bearing status;Patient requests pain meds ?PT Visit Diagnosis: Other abnormalities of gait and mobility (R26.89);Pain;Muscle weakness (generalized) (M62.81) ?Pain - Right/Left: Left ?Pain - part of body: Knee ?  ? ? ?Time: 0973-5329 ?PT Time Calculation (min) (ACUTE ONLY): 29 min ? ?Charges:  $Gait Training: 8-22 mins ?$Therapeutic Activity: 8-22 mins          ?          ? ?Julaine Fusi PTA ?02/18/22, 9:24 AM  ? ?

## 2022-02-19 DIAGNOSIS — Z881 Allergy status to other antibiotic agents status: Secondary | ICD-10-CM | POA: Diagnosis not present

## 2022-02-19 DIAGNOSIS — M1712 Unilateral primary osteoarthritis, left knee: Secondary | ICD-10-CM | POA: Diagnosis present

## 2022-02-19 DIAGNOSIS — R7303 Prediabetes: Secondary | ICD-10-CM | POA: Diagnosis present

## 2022-02-19 DIAGNOSIS — G2581 Restless legs syndrome: Secondary | ICD-10-CM | POA: Diagnosis present

## 2022-02-19 DIAGNOSIS — Z96651 Presence of right artificial knee joint: Secondary | ICD-10-CM | POA: Diagnosis present

## 2022-02-19 DIAGNOSIS — Z8673 Personal history of transient ischemic attack (TIA), and cerebral infarction without residual deficits: Secondary | ICD-10-CM | POA: Diagnosis not present

## 2022-02-19 DIAGNOSIS — Z9884 Bariatric surgery status: Secondary | ICD-10-CM | POA: Diagnosis not present

## 2022-02-19 DIAGNOSIS — I10 Essential (primary) hypertension: Secondary | ICD-10-CM | POA: Diagnosis present

## 2022-02-19 DIAGNOSIS — Z8249 Family history of ischemic heart disease and other diseases of the circulatory system: Secondary | ICD-10-CM | POA: Diagnosis not present

## 2022-02-19 DIAGNOSIS — I959 Hypotension, unspecified: Secondary | ICD-10-CM | POA: Diagnosis not present

## 2022-02-19 DIAGNOSIS — M797 Fibromyalgia: Secondary | ICD-10-CM | POA: Diagnosis present

## 2022-02-19 DIAGNOSIS — K219 Gastro-esophageal reflux disease without esophagitis: Secondary | ICD-10-CM | POA: Diagnosis present

## 2022-02-19 DIAGNOSIS — Z888 Allergy status to other drugs, medicaments and biological substances status: Secondary | ICD-10-CM | POA: Diagnosis not present

## 2022-02-19 DIAGNOSIS — E785 Hyperlipidemia, unspecified: Secondary | ICD-10-CM | POA: Diagnosis present

## 2022-02-19 DIAGNOSIS — Z8261 Family history of arthritis: Secondary | ICD-10-CM | POA: Diagnosis not present

## 2022-02-19 DIAGNOSIS — M1612 Unilateral primary osteoarthritis, left hip: Secondary | ICD-10-CM | POA: Diagnosis present

## 2022-02-19 DIAGNOSIS — Z972 Presence of dental prosthetic device (complete) (partial): Secondary | ICD-10-CM | POA: Diagnosis not present

## 2022-02-19 DIAGNOSIS — Z8701 Personal history of pneumonia (recurrent): Secondary | ICD-10-CM | POA: Diagnosis not present

## 2022-02-19 DIAGNOSIS — Z880 Allergy status to penicillin: Secondary | ICD-10-CM | POA: Diagnosis not present

## 2022-02-19 MED ORDER — DIPHENHYDRAMINE HCL 25 MG PO CAPS
25.0000 mg | ORAL_CAPSULE | Freq: Four times a day (QID) | ORAL | Status: DC | PRN
  Administered 2022-02-19 – 2022-02-21 (×7): 25 mg via ORAL
  Filled 2022-02-19 (×7): qty 1

## 2022-02-19 NOTE — Progress Notes (Signed)
Physical Therapy Treatment ?Patient Details ?Name: Terri Wood ?MRN: [id number] ?DOB: Jun 07, 1966 ?Today's Date: 02/19/2022 ? ? ?History of Present Illness Pt is a 56 y.o. 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 ? ?  ?PT Comments  ? ? Participated in exercises as described below. To EOB with heavy use of bed rails to EOB.  Steady in sitting.  Heavy R lateral lean to get upright after 3 attempts.  She is able to walk to/from bathroom with very slow challenging gait with frequent self initiated rest breaks.  She does ask about MCL and is referred to PA for further questions.  Discussed with PA.  She is limited in knee flexion today due to pain.  Remained in chair with polar care.  Upon arrival, pt did have stiockinette sleeve on but it was wrinkled and not fully protecting skin from direct contact.  Pillow case applied and education to keep skin fully covered between polar care and skin.  Voiced understanding. ? ?Pt is not progressing with gait as expected and remains unable to walk household distances and to manage bed mobility along with limited knee flexion.  Recommendations changed to SNF.  Pt aware and in agreement. ?  ?Recommendations for follow up therapy are one component of a multi-disciplinary discharge planning process, led by the attending physician.  Recommendations may be updated based on patient status, additional functional criteria and insurance authorization. ? ?Follow Up Recommendations ? Skilled nursing-short term rehab (<3 hours/day) ?  ?  ?Assistance Recommended at Discharge Intermittent Supervision/Assistance  ?Patient can return home with the following A little help with bathing/dressing/bathroom;Assistance with cooking/housework;A lot of help with walking and/or  transfers;Assist for transportation;Help with stairs or ramp for entrance ?  ?Equipment Recommendations ? None recommended by PT  ?  ?Recommendations for Other Services   ? ? ?  ?Precautions / Restrictions Precautions ?Precautions: Fall;Knee ?Precaution Booklet Issued: Yes (comment) ?Restrictions ?Weight Bearing Restrictions: Yes ?LLE Weight Bearing: Weight bearing as tolerated  ?  ? ?Mobility ? Bed Mobility ?Overal bed mobility: Needs Assistance ?Bed Mobility: Supine to Sit ?  ?  ?Supine to sit: Supervision ?  ?  ?General bed mobility comments: heavy use of bedrails and self hooking of LLE to get to EOB. ?  ? ?Transfers ?Overall transfer level: Needs assistance ?Equipment used: Rolling walker (2 wheels) ?Transfers: Sit to/from Stand ?Sit to Stand: Min assist ?  ?  ?  ?  ?  ?General transfer comment: heavy right lean to get upright ?  ? ?Ambulation/Gait ?Ambulation/Gait assistance: Min guard ?Gait Distance (Feet): 10 Feet ?Assistive device: Rolling walker (2 wheels) ?Gait Pattern/deviations: Step-to pattern, Antalgic ?Gait velocity: extremely slow ?  ?  ?General Gait Details: very slow and laborous to and from bathroom to void and very small BM ? ? ?Stairs ?  ?  ?  ?  ?  ? ? ?Wheelchair Mobility ?  ? ?Modified Rankin (Stroke Patients Only) ?  ? ? ?  ?Balance Overall balance assessment: Needs assistance ?Sitting-balance support: No upper extremity supported, Feet supported ?Sitting balance-Leahy Scale: Good ?  ?  ?Standing balance support: Bilateral upper extremity supported, Reliant on assistive device for balance, During functional activity ?Standing balance-Leahy Scale: Fair ?Standing balance comment: steady static standing with B UE support on RW ?  ?  ?  ?  ?  ?  ?  ?  ?  ?  ?  ?  ? ?  ?  Cognition Arousal/Alertness: Awake/alert ?Behavior During Therapy: [facility] for tasks assessed/performed ?Overall Cognitive Status: Within Functional Limits for tasks assessed ?  ?  ?  ?  ?  ?  ?  ?  ?  ?  ?  ?  ?  ?  ?  ?  ?  ?  ?   ? ?  ?Exercises Total Joint Exercises ?Ankle Circles/Pumps: AROM, Strengthening, Both, 10 reps, Supine ?Quad Sets: AROM, Strengthening, Left, 10 reps, Supine ?Heel Slides: AAROM, Strengthening, Left, 10 reps, Supine ?Hip ABduction/ADduction: AAROM, Strengthening, Left, 10 reps, Supine ?Straight Leg Raises: AAROM, Strengthening, Left, 10 reps, Supine ?Goniometric ROM: 70 degrees with increased pain today ? ?  ?General Comments   ?  ?  ? ?Pertinent Vitals/Pain Pain Assessment ?Pain Assessment: 0-10 ?Pain Score: 8  ?Faces Pain Scale: Hurts whole lot ?Pain Location: L knee ?Pain Descriptors / Indicators: Aching, Tender, Sore, Guarding ?Pain Intervention(s): Limited activity within patient's tolerance, Monitored during session, Premedicated before session, Ice applied  ? ? ?Home Living   ?  ?  ?  ?  ?  ?  ?  ?  ?  ?   ?  ?Prior Function    ?  ?  ?   ? ?PT Goals (current goals can now be found in the care plan section) Progress towards PT goals: Progressing toward goals ? ?  ?Frequency ? ? ? BID ? ? ? ?  ?PT Plan Discharge plan needs to be updated  ? ? ?Co-evaluation   ?  ?  ?  ?  ? ?  ?AM-PAC PT "6 Clicks" Mobility   ?Outcome Measure ? Help needed turning from your back to your side while in a flat bed without using bedrails?: A Little ?Help needed moving from lying on your back to sitting on the side of a flat bed without using bedrails?: A Lot ?Help needed moving to and from a bed to a chair (including a wheelchair)?: A Little ?Help needed standing up from a chair using your arms (e.g., wheelchair or bedside chair)?: A Little ?Help needed to walk in hospital room?: A Little ?Help needed climbing 3-5 steps with a railing? : A Lot ?6 Click Score: 16 ? ?  ?End of Session   ?Activity Tolerance: Patient tolerated treatment well ?Patient left: in bed;with call bell/phone within reach;with bed alarm set;with family/visitor present ?Nurse Communication: Mobility status;Precautions;Weight bearing status;Patient requests pain  meds ?PT Visit Diagnosis: Other abnormalities of gait and mobility (R26.89);Pain;Muscle weakness (generalized) (M62.81) ?Pain - Right/Left: Left ?Pain - part of body: Knee ?  ? ? ?Time: 1601-0932 ?PT Time Calculation (min) (ACUTE ONLY): 40 min ? ?Charges:  $Gait Training: 8-22 mins ?$Therapeutic Exercise: 8-22 mins ?$Therapeutic Activity: 8-22 mins          ?         Danielle Dess, PTA ?02/19/22, 10:20 AM ? ?

## 2022-02-19 NOTE — Progress Notes (Signed)
?  Subjective: ?3 Days Post-Op Procedure(s) (LRB): ?TOTAL KNEE ARTHROPLASTY (Left) ?Patient reports pain as moderate.   ?Patient is well, but has had some minor complaints of itching around the knee as well as her eyes and nose. ?Plan is to go Skilled nursing facility after hospital stay. ?Negative for chest pain and shortness of breath ?Fever: no ?Gastrointestinal: negative for nausea and vomiting.  Patient has had a bowel movement. ? ?Objective: ?Vital signs in last 24 hours: ?Temp:  [97.4 ?F (36.3 ?C)-98.2 ?F (36.8 ?C)] 98.2 ?F (36.8 ?C) (05/07 0726) ?Pulse Rate:  [89-98] 91 (05/07 0726) ?Resp:  [15-20] 15 (05/07 0726) ?BP: (109-133)/(55-77) 117/65 (05/07 0726) ?SpO2:  [93 %-100 %] 96 % (05/07 0726) ? ?Intake/Output from previous day: ? ?Intake/Output Summary (Last 24 hours) at 02/19/2022 1004 ?Last data filed at 02/18/2022 1905 ?Gross per 24 hour  ?Intake 120 ml  ?Output --  ?Net 120 ml  ?  ?Intake/Output this shift: ?No intake/output data recorded. ? ?Labs: ?Recent Labs  ?  02/17/22 ?0319 02/18/22 ?0402  ?HGB 11.8* 11.2*  ? ?Recent Labs  ?  02/17/22 ?0319 02/18/22 ?0402  ?WBC 11.0* 11.2*  ?RBC 3.96 3.81*  ?HCT 37.9 35.5*  ?PLT 297 284  ? ?Recent Labs  ?  02/17/22 ?0319  ?NA 133*  ?K 4.4  ?CL 99  ?CO2 28  ?BUN 17  ?CREATININE 0.92  ?GLUCOSE 175*  ?CALCIUM 8.4*  ? ?No results for input(s): LABPT, INR in the last 72 hours. ? ? ?EXAM ?General - Patient is Alert, Appropriate, and Oriented ?Extremity - Neurovascular intact ?Dorsiflexion/Plantar flexion intact ?Compartment soft ?Dressing/Incision -Polar Care in place and working. , Passenger transport manager in place, no drainage in cannister ?Motor Function - intact, moving foot and toes well on exam.    ? ? ?Assessment/Plan: ?3 Days Post-Op Procedure(s) (LRB): ?TOTAL KNEE ARTHROPLASTY (Left) ?Principal Problem: ?  S/P TKR (total knee replacement) using cement, left ? ?Estimated body mass index is 48.57 kg/m? as calculated from the following: ?  Height as of this encounter: 5\' 1"  (1.549  m). ?  Weight as of this encounter: 116.6 kg. ?Advance diet ?Up with therapy ? ?Spoke with PT, has changed rec to SNF. Spoke with patient and she is stil not comfortable going home given her only support is 9 yr old mother, is agreeable to SNF.  ? ? ? ?DVT Prophylaxis - ASA, Plavix, TED hose  ?Weight-Bearing as tolerated to left leg ? ?96, PA-C ?[facility] ?02/19/2022, 10:04 AM ? ?

## 2022-02-19 NOTE — Progress Notes (Signed)
Patient refused bone foam. Educated about purpose. ?

## 2022-02-20 MED ORDER — ASPIRIN 81 MG PO CHEW
81.0000 mg | CHEWABLE_TABLET | Freq: Two times a day (BID) | ORAL | 0 refills | Status: DC
Start: 2022-02-20 — End: 2023-06-21

## 2022-02-20 MED ORDER — HYDROCHLOROTHIAZIDE 25 MG PO TABS: 25.0000 mg | ORAL_TABLET | Freq: Every day | ORAL | Status: DC

## 2022-02-20 MED ORDER — LISINOPRIL 20 MG PO TABS
20.0000 mg | ORAL_TABLET | Freq: Every day | ORAL | Status: DC
  Administered 2022-02-20: 20 mg via ORAL
  Filled 2022-02-20 (×2): qty 1

## 2022-02-20 NOTE — Progress Notes (Signed)
Occupational Therapy Treatment ?Patient Details ?Name: Terri Wood ?MRN: [id number] ?DOB: Jan 22, 1966 ?Today's Date: 02/20/2022 ? ? ?History of present illness Pt is a 56 y.o. 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 ?  ?OT comments ? Pt seen for OT tx. Pt declined ADL mobility 2/2 significant L knee pain and reporting having just gotten up and walked. Pt set up for seated grooming tasks while pt further instructed in home/routines modifications, polar care mgt, and pain mgt strategies to maximize safety/indep. Pt continues to benefit from skilled OT services to maximize return to PLOF.   ? ?Recommendations for follow up therapy are one component of a multi-disciplinary discharge planning process, led by the attending physician.  Recommendations may be updated based on patient status, additional functional criteria and insurance authorization. ?   ?Follow Up Recommendations ? Skilled nursing-short term rehab (<3 hours/day)  ?  ?Assistance Recommended at Discharge Intermittent Supervision/Assistance  ?Patient can return home with the following ? A lot of help with bathing/dressing/bathroom;A little help with walking and/or transfers;Help with stairs or ramp for entrance;Assistance with cooking/housework;Assist for transportation ?  ?Equipment Recommendations ? None recommended by OT (pt has bsc and shower chair)  ?  ?Recommendations for Other Services   ? ?  ?Precautions / Restrictions Precautions ?Precautions: Fall;Knee ?Restrictions ?Weight Bearing Restrictions: Yes ?LLE Weight Bearing: Weight bearing as tolerated  ? ? ?  ? ?Mobility Bed Mobility ?  ?  ?  ?  ?  ?  ?  ?General bed mobility comments: NT, up in recliner ?  ? ?Transfers ?  ?  ?  ?  ?  ?  ?  ?  ?  ?General transfer comment: pt  declined 2/2 pain and recently to chair ?  ?  ?Balance   ?  ?  ?  ?  ?  ?  ?  ?  ?  ?  ?  ?  ?  ?  ?  ?  ?  ?  ?   ? ?ADL either performed or assessed with clinical judgement  ? ?ADL Overall ADL's : Needs assistance/impaired ?  ?  ?Grooming: Wash/dry face;Oral care;Sitting;Set up ?  ?  ?  ?  ?  ?  ?  ?  ?  ?  ?  ?  ?  ?  ?  ?  ?  ?  ? ?Extremity/Trunk Assessment   ?  ?  ?  ?  ?  ? ?Vision   ?  ?  ?Perception   ?  ?Praxis   ?  ? ?Cognition Arousal/Alertness: Awake/alert ?Behavior During Therapy: [facility] for tasks assessed/performed ?Overall Cognitive Status: Within Functional Limits for tasks assessed ?  ?  ?  ?  ?  ?  ?  ?  ?  ?  ?  ?  ?  ?  ?  ?  ?  ?  ?  ?   ?Exercises Other Exercises ?Other Exercises: Pt instructed in polar care mgt, home/routines modifications, pain mgt strategies ? ?  ?Shoulder Instructions   ? ? ?  ?General Comments    ? ? ?Pertinent Vitals/ Pain       Pain Assessment ?Pain Assessment: 0-10 ?Pain Score: 9  ?Pain Location: L knee ?Pain Descriptors / Indicators: Aching, Tender, Sore, Guarding ?  Pain Intervention(s): Limited activity within patient's tolerance, Monitored during session, Premedicated before session, Repositioned ? ?Home Living   ?  ?  ?  ?  ?  ?  ?  ?  ?  ?  ?  ?  ?  ?  ?  ?  ?  ?  ? ?  ?Prior Functioning/Environment    ?  ?  ?  ?   ? ?Frequency ? Min 2X/week  ? ? ? ? ?  ?Progress Toward Goals ? ?OT Goals(current goals can now be found in the care plan section) ? Progress towards OT goals: Progressing toward goals ? ?Acute Rehab OT Goals ?Patient Stated Goal: go home ?OT Goal Formulation: With patient ?Time For Goal Achievement: 03/03/22 ?Potential to Achieve Goals: Good  ?Plan Discharge plan remains appropriate;Frequency remains appropriate   ? ?Co-evaluation ? ? ?   ?  ?  ?  ?  ? ?  ?AM-PAC OT "6 Clicks" Daily Activity     ?Outcome Measure ? ? Help from another person eating meals?: None ?Help from another person taking care of personal grooming?: None ?Help from another person  toileting, which includes using toliet, bedpan, or urinal?: A Little ?Help from another person bathing (including washing, rinsing, drying)?: A Lot ?Help from another person to put on and taking off regular upper body clothing?: A Little ?Help from another person to put on and taking off regular lower body clothing?: A Lot ?6 Click Score: 18 ? ?  ?End of Session   ? ?OT Visit Diagnosis: Unsteadiness on feet (R26.81);Muscle weakness (generalized) (M62.81) ?  ?Activity Tolerance Patient limited by pain ?  ?Patient Left in chair;with call bell/phone within reach ?  ?Nurse Communication   ?  ? ?   ? ?Time: [id number] ?OT Time Calculation (min): 16 min ? ?Charges: OT General Charges ?$OT Visit: 1 Visit ?OT Treatments ?$Self Care/Home Management : 8-22 mins ? ?Ardeth Perfect., MPH, MS, OTR/L ?ascom [phone] ?02/20/22, 12:42 PM ? ?

## 2022-02-20 NOTE — TOC Progression Note (Signed)
Transition of Care (TOC) - Progression Note  ? ? ?Patient Details  ?Name: Angelyse Heslin Corella ?MRN: [id number] ?Date of Birth: 19-Jul-1966 ? ?Transition of Care (TOC) CM/SW Contact  ?Conception Oms, RN ?Phone Number: ?02/20/2022, 11:14 AM ? ?Clinical Narrative:    ?Met with the patient and reviewed the bed offers for STR SNF< She chose [facility], I notified Tonya at [facility] Ins pending approval ? ? ?Expected Discharge Plan: [place] ?Barriers to Discharge: Continued Medical Work up ? ?Expected Discharge Plan and Services ?Expected Discharge Plan: [place] ?  ?  ?  ?Living arrangements for the past 2 months: [place] ?Expected Discharge Date: 02/18/22               ?  ?  ?  ?  ?  ?  ?  ?  ?  ?  ? ? ?Social Determinants of Health (SDOH) Interventions ?  ? ?Readmission Risk Interventions ?   ? View : No data to display.  ?  ?  ?  ? ? ?

## 2022-02-20 NOTE — Progress Notes (Signed)
Physical Therapy Treatment ?Patient Details ?Name: Terri Wood ?MRN: [id number] ?DOB: 10-Jun-1966 ?Today's Date: 02/20/2022 ? ? ?History of Present Illness Pt is a 56 y.o. 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 ? ?  ?PT Comments  ? ? Participated in exercises as described below. Slow but steady gait 10' limited by pain and fatigue.  SNF remains appropriate for transition.  ?Recommendations for follow up therapy are one component of a multi-disciplinary discharge planning process, led by the attending physician.  Recommendations may be updated based on patient status, additional functional criteria and insurance authorization. ? ?Follow Up Recommendations ? Skilled nursing-short term rehab (<3 hours/day) ?  ?  ?Assistance Recommended at Discharge Intermittent Supervision/Assistance  ?Patient can return home with the following A little help with bathing/dressing/bathroom;Assistance with cooking/housework;A lot of help with walking and/or transfers;Assist for transportation;Help with stairs or ramp for entrance ?  ?Equipment Recommendations ? None recommended by PT  ?  ?Recommendations for Other Services   ? ? ?  ?Precautions / Restrictions Precautions ?Precautions: Fall;Knee ?Precaution Booklet Issued: Yes (comment) ?Restrictions ?Weight Bearing Restrictions: Yes ?LLE Weight Bearing: Weight bearing as tolerated  ?  ? ?Mobility ? Bed Mobility ?  ?  ?  ?  ?  ?  ?  ?General bed mobility comments: NT, up in recliner ?  ? ?Transfers ?Overall transfer level: Needs assistance ?Equipment used: Rolling walker (2 wheels) ?Transfers: Sit to/from Stand ?Sit to Stand: Min assist, Min guard ?  ?  ?  ?  ?  ?General transfer comment: improved quality today ?  ? ?Ambulation/Gait ?Ambulation/Gait assistance:  Min guard ?Gait Distance (Feet): 10 Feet ?Assistive device: Rolling walker (2 wheels) ?Gait Pattern/deviations: Step-to pattern, Antalgic ?Gait velocity: extremely slow ?  ?  ?General Gait Details: very slow gait but is a bit improved over yesterday with quality ? ? ?Stairs ?  ?  ?  ?  ?  ? ? ?Wheelchair Mobility ?  ? ?Modified Rankin (Stroke Patients Only) ?  ? ? ?  ?Balance Overall balance assessment: Needs assistance ?Sitting-balance support: No upper extremity supported, Feet supported ?Sitting balance-Leahy Scale: Good ?  ?  ?Standing balance support: Bilateral upper extremity supported, Reliant on assistive device for balance, During functional activity ?Standing balance-Leahy Scale: Fair ?Standing balance comment: steady static standing with B UE support on RW ?  ?  ?  ?  ?  ?  ?  ?  ?  ?  ?  ?  ? ?  ?Cognition Arousal/Alertness: Awake/alert ?Behavior During Therapy: [facility] for tasks assessed/performed ?Overall Cognitive Status: Within Functional Limits for tasks assessed ?  ?  ?  ?  ?  ?  ?  ?  ?  ?  ?  ?  ?  ?  ?  ?  ?  ?  ?  ? ?  ?Exercises Total Joint Exercises ?Ankle Circles/Pumps: AROM, Strengthening, Both, 10 reps, Supine ?Quad Sets: AROM, Strengthening, Left, 10 reps, Supine ?Heel Slides: AAROM, Strengthening, Left, 10 reps, Supine ?Hip ABduction/ADduction: AAROM, Strengthening, Left, 10 reps, Supine ?Straight Leg Raises: AAROM, Strengthening, Left, 10 reps, Supine ?Goniometric ROM: 70 degrees limited by pain ? ?  ?General Comments   ?  ?  ? ?Pertinent Vitals/Pain Pain Assessment ?Pain Assessment: Faces ?Faces Pain Scale: Hurts even more ?  Pain Descriptors / Indicators: Aching, Tender, Sore, Guarding ?Pain Intervention(s): Limited activity within patient's tolerance, Monitored during session, Premedicated before session, Repositioned, Ice applied  ? ? ?Home Living   ?  ?  ?  ?  ?  ?  ?  ?  ?  ?   ?  ?Prior Function    ?  ?  ?   ? ?PT Goals (current goals can now be found in the care plan section) Progress  towards PT goals: Progressing toward goals ? ?  ?Frequency ? ? ? BID ? ? ? ?  ?PT Plan Discharge plan needs to be updated  ? ? ?Co-evaluation   ?  ?  ?  ?  ? ?  ?AM-PAC PT "6 Clicks" Mobility   ?Outcome Measure ? Help needed turning from your back to your side while in a flat bed without using bedrails?: A Little ?Help needed moving from lying on your back to sitting on the side of a flat bed without using bedrails?: A Lot ?Help needed moving to and from a bed to a chair (including a wheelchair)?: A Little ?Help needed standing up from a chair using your arms (e.g., wheelchair or bedside chair)?: A Little ?Help needed to walk in hospital room?: A Little ?Help needed climbing 3-5 steps with a railing? : A Lot ?6 Click Score: 16 ? ?  ?End of Session   ?Activity Tolerance: Patient tolerated treatment well ?Patient left: in bed;with call bell/phone within reach;with bed alarm set;with family/visitor present ?Nurse Communication: Mobility status;Precautions;Weight bearing status;Patient requests pain meds ?PT Visit Diagnosis: Other abnormalities of gait and mobility (R26.89);Pain;Muscle weakness (generalized) (M62.81) ?Pain - Right/Left: Left ?Pain - part of body: Knee ?  ? ? ?Time: [id number] ?PT Time Calculation (min) (ACUTE ONLY): 24 min ? ?Charges:  $Gait Training: 8-22 mins ?$Therapeutic Exercise: 8-22 mins          ?         Chesley Noon, PTA ?02/20/22, 1:07 PM ? ?

## 2022-02-20 NOTE — Progress Notes (Addendum)
Physical Therapy Treatment ?Patient Details ?Name: Terri Wood ?MRN: [id number] ?DOB: June 02, 1966 ?Today's Date: 02/20/2022 ? ? ?History of Present Illness Pt is a 56 y.o. 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 ? ?  ?PT Comments  ? ? Stood and walked to/from bathroom with RW and slow antalgic gait.  Cues for walker placement.  She does have some difficulty fitting inside walker and kicks walker leg occasionally.  She may do better with bariatric walker to allow her to step up into walker box better.  Pain remains primary barrier for mobility. SNF remains appropriate.  She does remain motivated to return home and increase mobility and ROM. ?   ?Recommendations for follow up therapy are one component of a multi-disciplinary discharge planning process, led by the attending physician.  Recommendations may be updated based on patient status, additional functional criteria and insurance authorization. ? ?Follow Up Recommendations ? Skilled nursing-short term rehab (<3 hours/day) ?  ?  ?Assistance Recommended at Discharge Intermittent Supervision/Assistance  ?Patient can return home with the following A little help with bathing/dressing/bathroom;Assistance with cooking/housework;A lot of help with walking and/or transfers;Assist for transportation;Help with stairs or ramp for entrance ?  ?Equipment Recommendations ? None recommended by PT  ?  ?Recommendations for Other Services   ? ? ?  ?Precautions / Restrictions Precautions ?Precautions: Fall;Knee ?Precaution Booklet Issued: Yes (comment) ?Restrictions ?Weight Bearing Restrictions: Yes ?LLE Weight Bearing: Weight bearing as tolerated  ?  ? ?Mobility ? Bed Mobility ?Overal bed mobility: Needs Assistance ?Bed Mobility: Sit to Supine ?  ?  ?  ?Sit  to supine: Mod assist, Max assist ?  ?General bed mobility comments: for LE's ?  ? ?Transfers ?Overall transfer level: Needs assistance ?Equipment used: Rolling walker (2 wheels) ?Transfers: Sit to/from Stand ?Sit to Stand: Min assist, Min guard ?  ?  ?  ?  ?  ?General transfer comment: improved quality today ?  ? ?Ambulation/Gait ?Ambulation/Gait assistance: Min guard ?Gait Distance (Feet): 12 Feet ?Assistive device: Rolling walker (2 wheels) ?Gait Pattern/deviations: Step-to pattern, Decreased step length - right, Decreased step length - left, Decreased stance time - left ?Gait velocity: extremely slow ?  ?  ?General Gait Details: slow to and from bathroom but no LOB or buckling. ? ? ?Stairs ?  ?  ?  ?  ?  ? ? ?Wheelchair Mobility ?  ? ?Modified Rankin (Stroke Patients Only) ?  ? ? ?  ?Balance Overall balance assessment: Needs assistance ?Sitting-balance support: No upper extremity supported, Feet supported ?Sitting balance-Leahy Scale: Good ?  ?  ?Standing balance support: Bilateral upper extremity supported, Reliant on assistive device for balance, During functional activity ?Standing balance-Leahy Scale: Fair ?Standing balance comment: steady static standing with B UE support on RW ?  ?  ?  ?  ?  ?  ?  ?  ?  ?  ?  ?  ? ?  ?Cognition Arousal/Alertness: Awake/alert ?Behavior During Therapy: [facility] for tasks assessed/performed ?Overall Cognitive Status: Within Functional Limits for tasks assessed ?  ?  ?  ?  ?  ?  ?  ?  ?  ?  ?  ?  ?  ?  ?  ?  ?  ?  ?  ? ?  ?Exercises Total Joint Exercises ?Ankle  Circles/Pumps: AROM, Strengthening, Both, 10 reps, Supine ?Quad Sets: AROM, Strengthening, Left, 10 reps, Supine ?Heel Slides: AAROM, Strengthening, Left, 10 reps, Supine ?Hip ABduction/ADduction: AAROM, Strengthening, Left, 10 reps, Supine ?Straight Leg Raises: AAROM, Strengthening, Left, 10 reps, Supine ?Goniometric ROM: 70 degrees limited by pain ? ?  ?General Comments   ?  ?  ? ?Pertinent Vitals/Pain Pain  Assessment ?Pain Assessment: Faces ?Faces Pain Scale: Hurts even more ?Pain Location: L knee ?Pain Descriptors / Indicators: Aching, Tender, Sore, Guarding ?Pain Intervention(s): Limited activity within patient's tolerance, Monitored during session, Premedicated before session, Ice applied, Repositioned  ? ? ?Home Living   ?  ?  ?  ?  ?  ?  ?  ?  ?  ?   ?  ?Prior Function    ?  ?  ?   ? ?PT Goals (current goals can now be found in the care plan section) Progress towards PT goals: Progressing toward goals ? ?  ?Frequency ? ? ? BID ? ? ? ?  ?PT Plan Discharge plan needs to be updated  ? ? ?Co-evaluation   ?  ?  ?  ?  ? ?  ?AM-PAC PT "6 Clicks" Mobility   ?Outcome Measure ? Help needed turning from your back to your side while in a flat bed without using bedrails?: A Little ?Help needed moving from lying on your back to sitting on the side of a flat bed without using bedrails?: A Lot ?Help needed moving to and from a bed to a chair (including a wheelchair)?: A Little ?Help needed standing up from a chair using your arms (e.g., wheelchair or bedside chair)?: A Little ?Help needed to walk in hospital room?: A Little ?Help needed climbing 3-5 steps with a railing? : A Lot ?6 Click Score: 16 ? ?  ?End of Session   ?Activity Tolerance: Patient tolerated treatment well ?Patient left: in bed;with call bell/phone within reach;with bed alarm set;with family/visitor present ?Nurse Communication: Mobility status;Precautions;Weight bearing status;Patient requests pain meds ?PT Visit Diagnosis: Other abnormalities of gait and mobility (R26.89);Pain;Muscle weakness (generalized) (M62.81) ?Pain - Right/Left: Left ?Pain - part of body: Knee ?  ? ? ?Time: 9449-6759 ?PT Time Calculation (min) (ACUTE ONLY): 23 min ? ?Charges:  $Gait Training: 8-22 mins ?$Therapeutic Exercise: 8-22 mins ?$Therapeutic Activity: 8-22 mins          ?          ?Danielle Dess, PTA ?02/20/22, 2:11 PM\ ?

## 2022-02-20 NOTE — Plan of Care (Signed)
?  Problem: Education: ?Goal: Knowledge of General Education information will improve ?Description: Including pain rating scale, medication(s)/side effects and non-pharmacologic comfort measures ?Outcome: Progressing ?  ?Problem: Nutrition: ?Goal: Adequate nutrition will be maintained ?Outcome: Progressing ?  ?Problem: Elimination: ?Goal: Will not experience complications related to bowel motility ?Outcome: Progressing ?  ?Problem: Elimination: ?Goal: Will not experience complications related to urinary retention ?Outcome: Progressing ?  ?

## 2022-02-20 NOTE — TOC Progression Note (Signed)
Transition of Care (TOC) - Progression Note  ? ? ?Patient Details  ?Name: Terri Wood ?MRN: [id number] ?Date of Birth: November 25, 1965 ? ?Transition of Care (TOC) CM/SW Contact  ?Marlowe Sax, RN ?Phone Number: ?02/20/2022, 3:19 PM ? ?Clinical Narrative:    ? ?Patient will go to [facility] and they can accept her tomorrow, INS has been approved  ref number [id number] 5-05-20-09 ? ?Expected Discharge Plan: Home w Home Health Services ?Barriers to Discharge: Continued Medical Work up ? ?Expected Discharge Plan and Services ?Expected Discharge Plan: Home w Home Health Services ?  ?  ?  ?Living arrangements for the past 2 months: Single Family Home ?Expected Discharge Date: 02/18/22               ?  ?  ?  ?  ?  ?  ?  ?  ?  ?  ? ? ?Social Determinants of Health (SDOH) Interventions ?  ? ?Readmission Risk Interventions ?   ? View : No data to display.  ?  ?  ?  ? ? ?

## 2022-02-20 NOTE — Plan of Care (Signed)
  Problem: Activity: Goal: Risk for activity intolerance will decrease Outcome: Not Progressing   

## 2022-02-20 NOTE — Progress Notes (Signed)
? ?  Subjective: ?4 Days Post-Op Procedure(s) (LRB): ?TOTAL KNEE ARTHROPLASTY (Left) ?Patient reports pain as mild.   ?Patient is well, and has had no acute complaints or problems ?BP low this am, asymptomatic.  ?Denies any CP, SOB, ABD pain. ?We will continue therapy today.  ?Plan is to go SNF after hospital stay. ? ?Objective: ?Vital signs in last 24 hours: ?Temp:  [97.9 ?F (36.6 ?C)-98.4 ?F (36.9 ?C)] 98 ?F (36.7 ?C) (05/08 0355) ?Pulse Rate:  [79-85] 83 (05/08 0355) ?Resp:  [15-19] 15 (05/08 0355) ?BP: (98-107)/(51-55) 98/51 (05/08 0355) ?SpO2:  [96 %-100 %] 96 % (05/08 0355) ? ?Intake/Output from previous day: ?05/07 0701 - 05/08 0700 ?In: 480 [P.O.:480] ?Out: -  ?Intake/Output this shift: ?No intake/output data recorded. ? ?Recent Labs  ?  02/18/22 ?0402  ?HGB 11.2*  ? ?Recent Labs  ?  02/18/22 ?0402  ?WBC 11.2*  ?RBC 3.81*  ?HCT 35.5*  ?PLT 284  ? ?No results for input(s): NA, K, CL, CO2, BUN, CREATININE, GLUCOSE, CALCIUM in the last 72 hours. ?No results for input(s): LABPT, INR in the last 72 hours. ? ?EXAM ?General - Patient is Alert, Appropriate, and Oriented ?Extremity - Neurovascular intact ?Sensation intact distally ?Intact pulses distally ?Dorsiflexion/Plantar flexion intact ?No cellulitis present ?Compartment soft ?Dressing - dressing C/D/I and no drainage, provena intact with out drainage ?Motor Function - intact, moving foot and toes well on exam.  ? ?Past Medical History:  ?Diagnosis Date  ? Anxiety   ? Avascular necrosis of left femoral head (HCC) 04/09/2018  ? Carotid artery stenosis 08/05/2019  ? right  ? Cerebrovascular accident (CVA) due to occlusion of right middle cerebral artery (HCC) 05/03/2020  ? 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 Depression   ? Fibromyalgia   ? Foraminal stenosis of lumbar region 02/27/2018  ? GERD (gastroesophageal reflux disease)   ? Hyperkalemia 09/13/2017  ? Hyperlipidemia   ? Hypertension   ? Lumbar radiculopathy 12/15/2016  ?  Osteoarthritis of left hip 2019  ? Pneumonia   ? 01/16/2021 and 06/05/2021  ? Pre-diabetes   ? Restless leg syndrome 02/02/2015  ? Stroke [facility]) 08/05/2019  ? occlusion of right middle cerebral artery  ? ? ?Assessment/Plan:   ?4 Days Post-Op Procedure(s) (LRB): ?TOTAL KNEE ARTHROPLASTY (Left) ?Principal Problem: ?  S/P TKR (total knee replacement) using cement, left ? ?Estimated body mass index is 48.57 kg/m? as calculated from the following: ?  Height as of this encounter: 5\' 1"  (1.549 m). ?  Weight as of this encounter: 116.6 kg. ?Up with therapy ?Hypotension - BP soft, hold lisinopril HCTZ this am ?+ BM ?CM to assist with discharge to SNF. Patient ready for discharge to SNF ? ?DVT Prophylaxis - Aspirin, TED hose, and SCDs Plavix ?Weight-Bearing as tolerated to left leg ? ? ?T. , PA-C ?[facility] ?02/20/2022, 8:09 AM ?  ?

## 2022-02-20 NOTE — Discharge Summary (Addendum)
?Physician Discharge Summary  ?Patient ID: ?Terri Wood ?MRN: [id number] ?DOB/AGE: June 11, 1966 56 y.o. ? ?Admit date: 02/16/2022 ?Discharge date: 02/21/2022 ? ?Admission Diagnoses:  ?S/P TKR (total knee replacement) using cement, left [Z96.652] ? ? ?Discharge Diagnoses: ?Patient Active Problem List  ? Diagnosis Date Noted  ? 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 S/P TKR (total knee replacement) using cement, right 07/12/2021  ? Multifocal pneumonia 06/05/2021  ? AKI (acute kidney injury) (HCC) 06/05/2021  ? Borderline hyperglycemia 06/05/2021  ? Generalized weakness 06/05/2021  ? Acute respiratory failure (HCC) 01/16/2021  ? Community acquired pneumonia 01/16/2021  ? CVA, old, hemiparesis (HCC) 01/16/2021  ? Obesity, Class III, BMI 40-49.9 (morbid obesity) (HCC) 01/16/2021  ? Sepsis (HCC) 01/16/2021  ? Depression 01/16/2021  ? Essential hypertension 08/16/2019  ? Fibromyalgia 08/16/2019  ? Hyperlipidemia   ? Smoker   ? Acute CVA (cerebrovascular accident) (HCC) 08/15/2019  ? Avascular necrosis of left femoral head (HCC) 04/09/2018  ? Depression, unspecified 02/02/2015  ? ? ?Past Medical History:  ?Diagnosis Date  ? Anxiety   ? Avascular necrosis of left femoral head (HCC) 04/09/2018  ? Carotid artery stenosis 08/05/2019  ? right  ? Cerebrovascular accident (CVA) due to occlusion of right middle cerebral artery (HCC) 05/03/2020  ? 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 Depression   ? Fibromyalgia   ? Foraminal stenosis of lumbar region 02/27/2018  ? GERD (gastroesophageal reflux disease)   ? Hyperkalemia 09/13/2017  ? Hyperlipidemia   ? Hypertension   ? Lumbar radiculopathy 12/15/2016  ? Osteoarthritis of left hip 2019  ? Pneumonia   ? 01/16/2021 and 06/05/2021  ? Pre-diabetes   ? Restless leg syndrome 02/02/2015  ? Stroke [facility]) 08/05/2019  ? occlusion of right middle cerebral artery  ? ?  ?Transfusion: none ?  ?Consultants (if any):  ? ?Discharged Condition:  Improved ? ?Hospital Course: Terri Wood is an 56 y.o. female who was admitted 02/16/2022 with a diagnosis of S/P TKR (total knee replacement) using cement, left and went to the operating room on 02/16/2022 and underwent the above named procedures.  ?  ?Surgeries: Procedure(s): ?TOTAL KNEE ARTHROPLASTY on 02/16/2022 ?Patient tolerated the surgery well. Taken to PACU where she was stabilized and then transferred to the orthopedic floor. ? ?Started on Plavix, aspirin, SCDs applied bilaterally at 80 mm. Heels elevated on bed with rolled towels. No evidence of DVT. Negative Homan. ?Physical therapy started on day #1 for gait training and transfer. OT started day #1 for ADL and assisted devices. ? ?Patient's foley was d/c on day #1. Patient's IV  was d/c on day #2. ? ?On post op day #5 patient was stable and ready for discharge to SNF. ? ? ? ?She was given perioperative antibiotics:  ?Anti-infectives (From admission, onward)  ? ? Start     Dose/Rate Route Frequency Ordered Stop  ? 02/16/22 2015  ceFAZolin (ANCEF) 2-4 GM/100ML-% IVPB       ?Note to Pharmacy: Limmie Patricia P: cabinet override  ?    02/16/22 2015 02/16/22 2218  ? 02/16/22 1500  ceFAZolin (ANCEF) IVPB 2g/100 mL premix       ? 2 g ?200 mL/hr over 30 Minutes Intravenous Every 6 hours 02/16/22 1154 02/16/22 2120  ? 02/16/22 0600  ceFAZolin (ANCEF) IVPB 2g/100 mL premix       ? 2 g ?200 mL/hr over 30 Minutes Intravenous On call to O.R. 02/16/22 0031 02/16/22 0851  ? ?  ?. ? ?  She was given sequential compression devices, early ambulation, and aspirin, Plavix for DVT prophylaxis. ? ?She benefited maximally from the hospital stay and there were no complications.   ? ?Recent vital signs:  ?Vitals:  ? 02/21/22 0724 02/21/22 1206  ?BP: (!) 98/55 (!) 119/52  ?Pulse: 81 80  ?Resp: 15 17  ?Temp: 98.2 ?F (36.8 ?C) 98.1 ?F (36.7 ?C)  ?SpO2: 100% 98%  ? ? ?Recent laboratory studies:  ?Lab Results  ?Component Value Date  ? HGB 11.2 (L) 02/18/2022  ? HGB 11.8 (L) 02/17/2022   ? HGB 12.6 02/06/2022  ? ?Lab Results  ?Component Value Date  ? WBC 11.2 (H) 02/18/2022  ? PLT 284 02/18/2022  ? ?Lab Results  ?Component Value Date  ? INR 1.0 06/05/2021  ? ?Lab Results  ?Component Value Date  ? NA 133 (L) 02/17/2022  ? K 4.4 02/17/2022  ? CL 99 02/17/2022  ? CO2 28 02/17/2022  ? BUN 17 02/17/2022  ? CREATININE 0.92 02/17/2022  ? GLUCOSE 175 (H) 02/17/2022  ? ? ?Discharge Medications:   ?Allergies as of 02/21/2022   ? ?   Reactions  ? Penicillins Hives  ? TOLERATED ROCEPHIN AND CEFAZOLIN ?Did it involve swelling of the face/tongue/throat, SOB, or low BP? Yes ?Did it involve sudden or severe rash/hives, skin peeling, or any reaction on the inside of your mouth or nose? No ?Did you need to seek medical attention at a hospital or doctor's office? No ?When did it last happen? Within the past 10 years  ?If all above answers are "NO", may proceed with cephalosporin use.  ? Levofloxacin   ? Dizziness   ? ?  ? ?  ?Medication List  ?  ? ?TAKE these medications   ? ?amLODipine 10 MG tablet ?Commonly known as: NORVASC ?Take 5 mg by mouth at bedtime. ?  ?aspirin 81 MG chewable tablet ?Chew 1 tablet (81 mg total) by mouth 2 (two) times daily. ?  ?atorvastatin 80 MG tablet ?Commonly known as: LIPITOR ?Take 80 mg by mouth daily. ?  ?baclofen 10 MG tablet ?Commonly known as: LIORESAL ?Take 5 mg by mouth at bedtime as needed for muscle spasms. ?  ?buPROPion 150 MG 24 hr tablet ?Commonly known as: WELLBUTRIN XL ?Take 150 mg by mouth daily. ?  ?Cal Mag Zinc +D3 Tabs ?Take 1 tablet by mouth daily. ?  ?CALTRATE 600+D PO ?Take 1 tablet by mouth daily. ?  ?celecoxib 200 MG capsule ?Commonly known as: CELEBREX ?Take 200 mg by mouth daily. ?  ?cloNIDine 0.1 MG tablet ?Commonly known as: CATAPRES ?Take 0.1 mg by mouth at bedtime. ?  ?clopidogrel 75 MG tablet ?Commonly known as: PLAVIX ?Take 75 mg by mouth daily. ?  ?diclofenac Sodium 1 % Gel ?Commonly known as: VOLTAREN ?Apply 1 application topically 3 (three) times daily as  needed (pain). ?  ?docusate sodium 100 MG capsule ?Commonly known as: COLACE ?Take 1 capsule (100 mg total) by mouth 2 (two) times daily. ?  ?DULoxetine 60 MG capsule ?Commonly known as: CYMBALTA ?Take 60 mg by mouth 2 (two) times daily. ?  ?Gemtesa 75 MG Tabs ?Generic drug: Vibegron ?Take 1 tablet by mouth daily. ?  ?HYDROcodone-acetaminophen 10-325 MG tablet ?Commonly known as: Norco ?Take 1 tablet by mouth every 4 (four) hours as needed. ?What changed:  ?when to take this ?reasons to take this ?  ?lisinopril-hydrochlorothiazide 20-25 MG tablet ?Commonly known as: ZESTORETIC ?Take 1 tablet by mouth daily. ?  ?MENOPAUSE RELIEF PO ?Take 2  tablets by mouth daily. Amberen ?  ?methocarbamol 500 MG tablet ?Commonly known as: ROBAXIN ?Take 1 tablet (500 mg total) by mouth every 6 (six) hours as needed for muscle spasms. ?  ?minocycline 100 MG capsule ?Commonly known as: MINOCIN ?Take 100 mg by mouth 2 (two) times daily. ?  ?MULTI FOR HER 50+ PO ?Take 1 tablet by mouth daily. ?  ?Omega 3 1000 MG Caps ?Take 1,000 mg by mouth daily with breakfast. ?  ?omeprazole 40 MG capsule ?Commonly known as: PRILOSEC ?Take 40 mg by mouth daily. ?  ?OZEMPIC (2 MG/DOSE) [place] ?Inject 2 mg into the skin once a week. Friday ?  ?Ozempic (1 MG/DOSE) 4 MG/3ML Sopn ?Generic drug: Semaglutide (1 MG/DOSE) ?Inject 1 mg into the skin every 7 (seven) days. ?  ?pimecrolimus 1 % cream ?Commonly known as: ELIDEL ?Apply 1 application. topically in the morning and at bedtime. ?  ?polyethylene glycol 17 g packet ?Commonly known as: MIRALAX / GLYCOLAX ?Take 17 g by mouth daily as needed for mild constipation. ?  ?traMADol 50 MG tablet ?Commonly known as: ULTRAM ?Take 1 tablet (50 mg total) by mouth every 6 (six) hours. ?  ?Vitamin D3 125 MCG (5000 UT) Caps ?Take 5,000 Units by mouth daily. ?  ?vortioxetine HBr 5 MG Tabs tablet ?Commonly known as: TRINTELLIX ?Take 5 mg by mouth daily. ?  ? ?  ? ?  ?  ? ? ?  ?Durable Medical Equipment  ?(From admission, onward)   ?  ? ? ?  ? ?  Start     Ordered  ? 02/16/22 1155  DME Walker rolling  Once       ?Comments: Bariatric  ?Question Answer Comment  ?Walker: With 5 Inch Wheels   ?Patient needs a walker to treat with the

## 2022-02-21 DIAGNOSIS — T8484XD Pain due to internal orthopedic prosthetic devices, implants and grafts, subsequent encounter: Secondary | ICD-10-CM | POA: Diagnosis not present

## 2022-02-21 DIAGNOSIS — R7303 Prediabetes: Secondary | ICD-10-CM | POA: Diagnosis not present

## 2022-02-21 DIAGNOSIS — I6521 Occlusion and stenosis of right carotid artery: Secondary | ICD-10-CM | POA: Diagnosis not present

## 2022-02-21 DIAGNOSIS — Z471 Aftercare following joint replacement surgery: Secondary | ICD-10-CM | POA: Diagnosis not present

## 2022-02-21 DIAGNOSIS — K5903 Drug induced constipation: Secondary | ICD-10-CM | POA: Diagnosis not present

## 2022-02-21 DIAGNOSIS — K5900 Constipation, unspecified: Secondary | ICD-10-CM | POA: Diagnosis not present

## 2022-02-21 DIAGNOSIS — R739 Hyperglycemia, unspecified: Secondary | ICD-10-CM | POA: Diagnosis not present

## 2022-02-21 DIAGNOSIS — I10 Essential (primary) hypertension: Secondary | ICD-10-CM | POA: Diagnosis not present

## 2022-02-21 DIAGNOSIS — I69359 Hemiplegia and hemiparesis following cerebral infarction affecting unspecified side: Secondary | ICD-10-CM | POA: Diagnosis not present

## 2022-02-21 DIAGNOSIS — N3281 Overactive bladder: Secondary | ICD-10-CM | POA: Diagnosis not present

## 2022-02-21 DIAGNOSIS — M6281 Muscle weakness (generalized): Secondary | ICD-10-CM | POA: Diagnosis not present

## 2022-02-21 DIAGNOSIS — M797 Fibromyalgia: Secondary | ICD-10-CM | POA: Diagnosis not present

## 2022-02-21 DIAGNOSIS — K219 Gastro-esophageal reflux disease without esophagitis: Secondary | ICD-10-CM | POA: Diagnosis not present

## 2022-02-21 DIAGNOSIS — N179 Acute kidney failure, unspecified: Secondary | ICD-10-CM | POA: Diagnosis not present

## 2022-02-21 DIAGNOSIS — E785 Hyperlipidemia, unspecified: Secondary | ICD-10-CM | POA: Diagnosis not present

## 2022-02-21 DIAGNOSIS — G2581 Restless legs syndrome: Secondary | ICD-10-CM | POA: Diagnosis not present

## 2022-02-21 DIAGNOSIS — Z4789 Encounter for other orthopedic aftercare: Secondary | ICD-10-CM | POA: Diagnosis not present

## 2022-02-21 DIAGNOSIS — F32A Depression, unspecified: Secondary | ICD-10-CM | POA: Diagnosis not present

## 2022-02-21 DIAGNOSIS — Z7401 Bed confinement status: Secondary | ICD-10-CM | POA: Diagnosis not present

## 2022-02-21 DIAGNOSIS — R262 Difficulty in walking, not elsewhere classified: Secondary | ICD-10-CM | POA: Diagnosis not present

## 2022-02-21 DIAGNOSIS — R5381 Other malaise: Secondary | ICD-10-CM | POA: Diagnosis not present

## 2022-02-21 DIAGNOSIS — Z96652 Presence of left artificial knee joint: Secondary | ICD-10-CM | POA: Diagnosis not present

## 2022-02-21 DIAGNOSIS — R6889 Other general symptoms and signs: Secondary | ICD-10-CM | POA: Diagnosis not present

## 2022-02-21 MED ORDER — HYDROCHLOROTHIAZIDE 25 MG PO TABS: 25.0000 mg | ORAL_TABLET | Freq: Every day | ORAL | Status: DC

## 2022-02-21 MED ORDER — HYDROCODONE-ACETAMINOPHEN 10-325 MG PO TABS: 1.0000 | ORAL_TABLET | ORAL | 0 refills | Status: DC | PRN

## 2022-02-21 MED ORDER — LISINOPRIL 20 MG PO TABS
20.0000 mg | ORAL_TABLET | Freq: Every day | ORAL | Status: DC
  Administered 2022-02-21: 20 mg via ORAL
  Filled 2022-02-21: qty 1

## 2022-02-21 NOTE — Plan of Care (Signed)
  Problem: Education: Goal: Knowledge of General Education information will improve Description Including pain rating scale, medication(s)/side effects and non-pharmacologic comfort measures Outcome: Progressing   Problem: Health Behavior/Discharge Planning: Goal: Ability to manage health-related needs will improve Outcome: Progressing   

## 2022-02-21 NOTE — TOC Progression Note (Signed)
Transition of Care (TOC) - Progression Note  ? ? ?Patient Details  ?Name: Terri Wood ?MRN: [id number] ?Date of Birth: Jun 06, 1966 ? ?Transition of Care (TOC) CM/SW Contact  ?Conception Oms, RN ?Phone Number: ?02/21/2022, 1:09 PM ? ?Clinical Narrative:    ? ?Patient going to [facility] care for STR, room 2 B, She is aware and will notify her family ? ?EMS called and she is next on list to pick up ? ?Expected Discharge Plan: [place] ?Barriers to Discharge: Continued Medical Work up ? ?Expected Discharge Plan and Services ?Expected Discharge Plan: [place] ?  ?  ?  ?Living arrangements for the past 2 months: [place] ?Expected Discharge Date: 02/21/22               ?  ?  ?  ?  ?  ?  ?  ?  ?  ?  ? ? ?Social Determinants of Health (SDOH) Interventions ?  ? ?Readmission Risk Interventions ?   ? View : No data to display.  ?  ?  ?  ? ? ?

## 2022-02-21 NOTE — Progress Notes (Signed)
? ?  Subjective: ?5 Days Post-Op Procedure(s) (LRB): ?TOTAL KNEE ARTHROPLASTY (Left) ?Patient reports pain as mild.   ?Patient is well, and has had no acute complaints or problems ?BP low this am, asymptomatic.  ?Denies any CP, SOB, ABD pain. ?We will continue therapy today.  ?Plan is to go SNF after hospital stay. ? ?Objective: ?Vital signs in last 24 hours: ?Temp:  [97.4 ?F (36.3 ?C)-98.2 ?F (36.8 ?C)] 98.2 ?F (36.8 ?C) (05/09 4098) ?Pulse Rate:  [81-87] 81 (05/09 0724) ?Resp:  [15-18] 15 (05/09 0724) ?BP: (98-127)/(55-63) 98/55 (05/09 0724) ?SpO2:  [90 %-100 %] 100 % (05/09 0724) ? ?Intake/Output from previous day: ?05/08 0701 - 05/09 0700 ?In: 480 [P.O.:480] ?Out: -  ?Intake/Output this shift: ?No intake/output data recorded. ? ?No results for input(s): HGB in the last 72 hours. ? ?No results for input(s): WBC, RBC, HCT, PLT in the last 72 hours. ? ?No results for input(s): NA, K, CL, CO2, BUN, CREATININE, GLUCOSE, CALCIUM in the last 72 hours. ?No results for input(s): LABPT, INR in the last 72 hours. ? ?EXAM ?General - Patient is Alert, Appropriate, and Oriented ?Extremity - Neurovascular intact ?Sensation intact distally ?Intact pulses distally ?Dorsiflexion/Plantar flexion intact ?No cellulitis present ?Compartment soft ?Dressing - dressing C/D/I and no drainage, provena intact with out drainage ?Motor Function - intact, moving foot and toes well on exam.  ? ?Past Medical History:  ?Diagnosis Date  ? Anxiety   ? Avascular necrosis of left femoral head (HCC) 04/09/2018  ? Carotid artery stenosis 08/05/2019  ? right  ? Cerebrovascular accident (CVA) due to occlusion of right middle cerebral artery (HCC) 05/03/2020  ? 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 Depression   ? Fibromyalgia   ? Foraminal stenosis of lumbar region 02/27/2018  ? GERD (gastroesophageal reflux disease)   ? Hyperkalemia 09/13/2017  ? Hyperlipidemia   ? Hypertension   ? Lumbar radiculopathy 12/15/2016  ?  Osteoarthritis of left hip 2019  ? Pneumonia   ? 01/16/2021 and 06/05/2021  ? Pre-diabetes   ? Restless leg syndrome 02/02/2015  ? Stroke [facility]) 08/05/2019  ? occlusion of right middle cerebral artery  ? ? ?Assessment/Plan:   ?5 Days Post-Op Procedure(s) (LRB): ?TOTAL KNEE ARTHROPLASTY (Left) ?Principal Problem: ?  S/P TKR (total knee replacement) using cement, left ? ?Estimated body mass index is 48.57 kg/m? as calculated from the following: ?  Height as of this encounter: 5\' 1"  (1.549 m). ?  Weight as of this encounter: 116.6 kg. ?Up with therapy ?Hypotension - BP soft, hold lisinopril HCTZ this am. Patient asymptomatic with PT. ?+ BM ?Pain well controlled ?CM to assist with discharge to SNF today. ? ?DVT Prophylaxis - Aspirin, TED hose, and SCDs Plavix ?Weight-Bearing as tolerated to left leg ? ? ?T. , PA-C ?[facility] ?02/21/2022, 7:29 AM ?  ?

## 2022-02-21 NOTE — Progress Notes (Signed)
Physical Therapy Treatment ?Patient Details ?Name: Terri Wood ?MRN: [id number] ?DOB: Dec 22, 1965 ?Today's Date: 02/21/2022 ? ? ?History of Present Illness Pt is a 56 y.o. 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 ? ?  ?PT Comments  ? ? Pt resting in recliner upon PT arrival; agreeable to PT session.  During session pt CGA with transfers and CGA ambulating 16 feet and then (after sitting rest break) 15 feet with bariatric RW use.  Limited distance ambulating d/t L knee pain and nausea/feeling hot and sweaty (pt received pain meds prior to therapy session).  L knee AROM flexion to 75 degrees (limited d/t L knee pain).  Will continue to focus on strengthening, L knee ROM, and progressive functional mobility per pt tolerance. ?   ?Recommendations for follow up therapy are one component of a multi-disciplinary discharge planning process, led by the attending physician.  Recommendations may be updated based on patient status, additional functional criteria and insurance authorization. ? ?Follow Up Recommendations ? Skilled nursing-short term rehab (<3 hours/day) ?  ?  ?Assistance Recommended at Discharge Intermittent Supervision/Assistance  ?Patient can return home with the following A little help with bathing/dressing/bathroom;Assistance with cooking/housework;A lot of help with walking and/or transfers;Assist for transportation;Help with stairs or ramp for entrance ?  ?Equipment Recommendations ? Rolling walker (2 wheels) (bariatric)  ?  ?Recommendations for Other Services OT consult ? ? ?  ?Precautions / Restrictions Precautions ?Precautions: Fall;Knee ?Precaution Booklet Issued: Yes (comment) ?Restrictions ?Weight Bearing Restrictions: Yes ?LLE Weight Bearing: Weight bearing as tolerated  ?   ? ?Mobility ? Bed Mobility ?  ?  ?  ?  ?  ?  ?  ?General bed mobility comments: Deferred (pt in recliner beginning/end of session) ?  ? ?Transfers ?Overall transfer level: Needs assistance ?Equipment used: Rolling walker (2 wheels) ?Transfers: Sit to/from Stand ?Sit to Stand: Min guard ?  ?  ?  ?  ?  ?General transfer comment: increased effort/time to stand on own (x2 trials from recliner) ?  ? ?Ambulation/Gait ?Ambulation/Gait assistance: Min guard ?Gait Distance (Feet):  (16 feet; 15 feet) ?Assistive device: Rolling walker (2 wheels) (bariatric) ?Gait Pattern/deviations: Step-to pattern, Decreased step length - right, Decreased step length - left, Decreased stance time - left ?Gait velocity: decreased ?  ?  ?General Gait Details: antalgic ? ? ?Stairs ?  ?  ?  ?  ?  ? ? ?Wheelchair Mobility ?  ? ?Modified Rankin (Stroke Patients Only) ?  ? ? ?  ?Balance Overall balance assessment: Needs assistance ?Sitting-balance support: No upper extremity supported, Feet supported ?Sitting balance-Leahy Scale: Good ?Sitting balance - Comments: steady sitting reaching within BOS ?  ?Standing balance support: Bilateral upper extremity supported, Reliant on assistive device for balance, During functional activity ?Standing balance-Leahy Scale: Fair ?Standing balance comment: steady static standing with B UE support on RW ?  ?  ?  ?  ?  ?  ?  ?  ?  ?  ?  ?  ? ?  ?Cognition Arousal/Alertness: Awake/alert ?Behavior During Therapy: [facility] for tasks assessed/performed ?Overall Cognitive Status: Within Functional Limits for tasks assessed ?  ?  ?  ?  ?  ?  ?  ?  ?  ?  ?  ?  ?  ?  ?  ?  ?  ?  ?  ? ?  ?  Exercises Total Joint Exercises ?Ankle Circles/Pumps: AROM, Strengthening, Both, 10 reps (long-sitting in recliner) ?Quad Sets: AROM, Strengthening, Both, 10 reps (long-sitting in recliner) ?Heel Slides: AAROM, Strengthening, Left, 10 reps (long-sitting in recliner (semi-reclined)) ?Goniometric ROM: 8-75 degrees L knee ? ?  ?General Comments   Nursing cleared pt for participation in physical therapy.  Pt agreeable to PT session. ?  ?  ? ?Pertinent Vitals/Pain Pain Assessment ?Pain Assessment: 0-10 ?Pain Score: 7  ?Pain Descriptors / Indicators: Aching, Tender, Sore, Guarding ?Pain Intervention(s): Limited activity within patient's tolerance, Monitored during session, Premedicated before session, Repositioned, Other (comment) (polar care applied) ?Vitals (HR and O2 on room air) stable and WFL throughout treatment session.  ? ? ?Home Living   ?  ?  ?  ?  ?  ?  ?  ?  ?  ?   ?  ?Prior Function    ?  ?  ?   ? ?PT Goals (current goals can now be found in the care plan section) Acute Rehab PT Goals ?Patient Stated Goal: less pain ?PT Goal Formulation: With patient ?Time For Goal Achievement: 03/02/22 ?Potential to Achieve Goals: Fair ?Progress towards PT goals: Progressing toward goals ? ?  ?Frequency ? ? ? BID ? ? ? ?  ?PT Plan Current plan remains appropriate  ? ? ?Co-evaluation   ?  ?  ?  ?  ? ?  ?AM-PAC PT "6 Clicks" Mobility   ?Outcome Measure ? Help needed turning from your back to your side while in a flat bed without using bedrails?: A Little ?Help needed moving from lying on your back to sitting on the side of a flat bed without using bedrails?: A Lot ?Help needed moving to and from a bed to a chair (including a wheelchair)?: A Little ?Help needed standing up from a chair using your arms (e.g., wheelchair or bedside chair)?: A Little ?Help needed to walk in hospital room?: A Little ?Help needed climbing 3-5 steps with a railing? : A Lot ?6 Click Score: 16 ? ?  ?End of Session Equipment Utilized During Treatment: Gait belt ?Activity Tolerance: Patient limited by pain ?Patient left: in chair;with call bell/phone within reach;with SCD's reapplied;Other (comment) (B heels floating via towel rolls) ?Nurse Communication: Mobility status;Precautions;Weight bearing status ?PT Visit Diagnosis: Other abnormalities of gait and mobility (R26.89);Pain;Muscle  weakness (generalized) (M62.81) ?Pain - Right/Left: Left ?Pain - part of body: Knee ?  ? ? ?Time: 4235-3614 ?PT Time Calculation (min) (ACUTE ONLY): 38 min ? ?Charges:  $Gait Training: 8-22 mins ?$Therapeutic Exercise: 8-22 mins ?$Therapeutic Activity: 8-22 mins          ?          ?Hendricks Limes, PT ?02/21/22, 12:18 PM ? ? ?

## 2022-02-21 NOTE — Plan of Care (Signed)
?  Problem: Education: ?Goal: Knowledge of General Education information will improve ?Description: Including pain rating scale, medication(s)/side effects and non-pharmacologic comfort measures ?Outcome: Progressing ?  ?Problem: Health Behavior/Discharge Planning: ?Goal: Ability to manage health-related needs will improve ?Outcome: Progressing ?  ?Problem: Clinical Measurements: ?Goal: Ability to maintain clinical measurements within normal limits will improve ?Outcome: Progressing ?  ?Problem: Clinical Measurements: ?Goal: Will remain free from infection ?Outcome: Progressing ?  ?Problem: Clinical Measurements: ?Goal: Diagnostic test results will improve ?Outcome: Progressing ?  ?Problem: Clinical Measurements: ?Goal: Respiratory complications will improve ?Outcome: Progressing ?  ?Problem: Clinical Measurements: ?Goal: Cardiovascular complication will be avoided ?Outcome: Progressing ?  ?Problem: Activity: ?Goal: Risk for activity intolerance will decrease ?Outcome: Progressing ?  ?Problem: Coping: ?Goal: Level of anxiety will decrease ?Outcome: Progressing ?  ?Problem: Pain Managment: ?Goal: General experience of comfort will improve ?Outcome: Progressing ?  ?Problem: Safety: ?Goal: Ability to remain free from injury will improve ?Outcome: Progressing ?  ?Problem: Skin Integrity: ?Goal: Risk for impaired skin integrity will decrease ?Outcome: Progressing ?  ?

## 2022-02-22 DIAGNOSIS — M797 Fibromyalgia: Secondary | ICD-10-CM | POA: Diagnosis not present

## 2022-02-22 DIAGNOSIS — I10 Essential (primary) hypertension: Secondary | ICD-10-CM | POA: Diagnosis not present

## 2022-02-22 DIAGNOSIS — K5903 Drug induced constipation: Secondary | ICD-10-CM | POA: Diagnosis not present

## 2022-02-22 DIAGNOSIS — R7303 Prediabetes: Secondary | ICD-10-CM | POA: Diagnosis not present

## 2022-02-22 DIAGNOSIS — R5381 Other malaise: Secondary | ICD-10-CM | POA: Diagnosis not present

## 2022-02-22 DIAGNOSIS — Z96652 Presence of left artificial knee joint: Secondary | ICD-10-CM | POA: Diagnosis not present

## 2022-02-22 DIAGNOSIS — N3281 Overactive bladder: Secondary | ICD-10-CM | POA: Diagnosis not present

## 2022-02-22 DIAGNOSIS — F32A Depression, unspecified: Secondary | ICD-10-CM | POA: Diagnosis not present

## 2022-02-22 DIAGNOSIS — K219 Gastro-esophageal reflux disease without esophagitis: Secondary | ICD-10-CM | POA: Diagnosis not present

## 2022-02-22 DIAGNOSIS — Z471 Aftercare following joint replacement surgery: Secondary | ICD-10-CM | POA: Diagnosis not present

## 2022-02-23 DIAGNOSIS — R262 Difficulty in walking, not elsewhere classified: Secondary | ICD-10-CM | POA: Diagnosis not present

## 2022-02-23 DIAGNOSIS — Z471 Aftercare following joint replacement surgery: Secondary | ICD-10-CM | POA: Diagnosis not present

## 2022-02-23 DIAGNOSIS — Z96652 Presence of left artificial knee joint: Secondary | ICD-10-CM | POA: Diagnosis not present

## 2022-02-28 DIAGNOSIS — E785 Hyperlipidemia, unspecified: Secondary | ICD-10-CM | POA: Diagnosis not present

## 2022-02-28 DIAGNOSIS — Z7982 Long term (current) use of aspirin: Secondary | ICD-10-CM | POA: Diagnosis not present

## 2022-02-28 DIAGNOSIS — Z7902 Long term (current) use of antithrombotics/antiplatelets: Secondary | ICD-10-CM | POA: Diagnosis not present

## 2022-02-28 DIAGNOSIS — K219 Gastro-esophageal reflux disease without esophagitis: Secondary | ICD-10-CM | POA: Diagnosis not present

## 2022-02-28 DIAGNOSIS — Z792 Long term (current) use of antibiotics: Secondary | ICD-10-CM | POA: Diagnosis not present

## 2022-02-28 DIAGNOSIS — Z87891 Personal history of nicotine dependence: Secondary | ICD-10-CM | POA: Diagnosis not present

## 2022-02-28 DIAGNOSIS — Z9181 History of falling: Secondary | ICD-10-CM | POA: Diagnosis not present

## 2022-02-28 DIAGNOSIS — R7303 Prediabetes: Secondary | ICD-10-CM | POA: Diagnosis not present

## 2022-02-28 DIAGNOSIS — Z96652 Presence of left artificial knee joint: Secondary | ICD-10-CM | POA: Diagnosis not present

## 2022-02-28 DIAGNOSIS — I10 Essential (primary) hypertension: Secondary | ICD-10-CM | POA: Diagnosis not present

## 2022-02-28 DIAGNOSIS — M1612 Unilateral primary osteoarthritis, left hip: Secondary | ICD-10-CM | POA: Diagnosis not present

## 2022-02-28 DIAGNOSIS — M48061 Spinal stenosis, lumbar region without neurogenic claudication: Secondary | ICD-10-CM | POA: Diagnosis not present

## 2022-02-28 DIAGNOSIS — M797 Fibromyalgia: Secondary | ICD-10-CM | POA: Diagnosis not present

## 2022-02-28 DIAGNOSIS — Z471 Aftercare following joint replacement surgery: Secondary | ICD-10-CM | POA: Diagnosis not present

## 2022-02-28 DIAGNOSIS — Z96651 Presence of right artificial knee joint: Secondary | ICD-10-CM | POA: Diagnosis not present

## 2022-02-28 DIAGNOSIS — G2581 Restless legs syndrome: Secondary | ICD-10-CM | POA: Diagnosis not present

## 2022-02-28 DIAGNOSIS — I69359 Hemiplegia and hemiparesis following cerebral infarction affecting unspecified side: Secondary | ICD-10-CM | POA: Diagnosis not present

## 2022-03-02 DIAGNOSIS — G2581 Restless legs syndrome: Secondary | ICD-10-CM | POA: Diagnosis not present

## 2022-03-02 DIAGNOSIS — E785 Hyperlipidemia, unspecified: Secondary | ICD-10-CM | POA: Diagnosis not present

## 2022-03-02 DIAGNOSIS — Z471 Aftercare following joint replacement surgery: Secondary | ICD-10-CM | POA: Diagnosis not present

## 2022-03-02 DIAGNOSIS — Z96652 Presence of left artificial knee joint: Secondary | ICD-10-CM | POA: Diagnosis not present

## 2022-03-02 DIAGNOSIS — K219 Gastro-esophageal reflux disease without esophagitis: Secondary | ICD-10-CM | POA: Diagnosis not present

## 2022-03-02 DIAGNOSIS — Z87891 Personal history of nicotine dependence: Secondary | ICD-10-CM | POA: Diagnosis not present

## 2022-03-02 DIAGNOSIS — Z96651 Presence of right artificial knee joint: Secondary | ICD-10-CM | POA: Diagnosis not present

## 2022-03-02 DIAGNOSIS — M797 Fibromyalgia: Secondary | ICD-10-CM | POA: Diagnosis not present

## 2022-03-02 DIAGNOSIS — I10 Essential (primary) hypertension: Secondary | ICD-10-CM | POA: Diagnosis not present

## 2022-03-02 DIAGNOSIS — M1612 Unilateral primary osteoarthritis, left hip: Secondary | ICD-10-CM | POA: Diagnosis not present

## 2022-03-02 DIAGNOSIS — I69359 Hemiplegia and hemiparesis following cerebral infarction affecting unspecified side: Secondary | ICD-10-CM | POA: Diagnosis not present

## 2022-03-02 DIAGNOSIS — M48061 Spinal stenosis, lumbar region without neurogenic claudication: Secondary | ICD-10-CM | POA: Diagnosis not present

## 2022-03-02 DIAGNOSIS — Z7982 Long term (current) use of aspirin: Secondary | ICD-10-CM | POA: Diagnosis not present

## 2022-03-02 DIAGNOSIS — Z9181 History of falling: Secondary | ICD-10-CM | POA: Diagnosis not present

## 2022-03-02 DIAGNOSIS — Z792 Long term (current) use of antibiotics: Secondary | ICD-10-CM | POA: Diagnosis not present

## 2022-03-02 DIAGNOSIS — Z7902 Long term (current) use of antithrombotics/antiplatelets: Secondary | ICD-10-CM | POA: Diagnosis not present

## 2022-03-02 DIAGNOSIS — R7303 Prediabetes: Secondary | ICD-10-CM | POA: Diagnosis not present

## 2022-03-04 DIAGNOSIS — M797 Fibromyalgia: Secondary | ICD-10-CM | POA: Diagnosis not present

## 2022-03-04 DIAGNOSIS — M1612 Unilateral primary osteoarthritis, left hip: Secondary | ICD-10-CM | POA: Diagnosis not present

## 2022-03-04 DIAGNOSIS — I10 Essential (primary) hypertension: Secondary | ICD-10-CM | POA: Diagnosis not present

## 2022-03-04 DIAGNOSIS — Z792 Long term (current) use of antibiotics: Secondary | ICD-10-CM | POA: Diagnosis not present

## 2022-03-04 DIAGNOSIS — Z87891 Personal history of nicotine dependence: Secondary | ICD-10-CM | POA: Diagnosis not present

## 2022-03-04 DIAGNOSIS — Z96652 Presence of left artificial knee joint: Secondary | ICD-10-CM | POA: Diagnosis not present

## 2022-03-04 DIAGNOSIS — Z96651 Presence of right artificial knee joint: Secondary | ICD-10-CM | POA: Diagnosis not present

## 2022-03-04 DIAGNOSIS — Z7982 Long term (current) use of aspirin: Secondary | ICD-10-CM | POA: Diagnosis not present

## 2022-03-04 DIAGNOSIS — G2581 Restless legs syndrome: Secondary | ICD-10-CM | POA: Diagnosis not present

## 2022-03-04 DIAGNOSIS — E785 Hyperlipidemia, unspecified: Secondary | ICD-10-CM | POA: Diagnosis not present

## 2022-03-04 DIAGNOSIS — K219 Gastro-esophageal reflux disease without esophagitis: Secondary | ICD-10-CM | POA: Diagnosis not present

## 2022-03-04 DIAGNOSIS — R7303 Prediabetes: Secondary | ICD-10-CM | POA: Diagnosis not present

## 2022-03-04 DIAGNOSIS — Z9181 History of falling: Secondary | ICD-10-CM | POA: Diagnosis not present

## 2022-03-04 DIAGNOSIS — I69359 Hemiplegia and hemiparesis following cerebral infarction affecting unspecified side: Secondary | ICD-10-CM | POA: Diagnosis not present

## 2022-03-04 DIAGNOSIS — Z7902 Long term (current) use of antithrombotics/antiplatelets: Secondary | ICD-10-CM | POA: Diagnosis not present

## 2022-03-04 DIAGNOSIS — M48061 Spinal stenosis, lumbar region without neurogenic claudication: Secondary | ICD-10-CM | POA: Diagnosis not present

## 2022-03-04 DIAGNOSIS — Z471 Aftercare following joint replacement surgery: Secondary | ICD-10-CM | POA: Diagnosis not present

## 2022-03-06 DIAGNOSIS — Z87891 Personal history of nicotine dependence: Secondary | ICD-10-CM | POA: Diagnosis not present

## 2022-03-06 DIAGNOSIS — Z96651 Presence of right artificial knee joint: Secondary | ICD-10-CM | POA: Diagnosis not present

## 2022-03-06 DIAGNOSIS — E785 Hyperlipidemia, unspecified: Secondary | ICD-10-CM | POA: Diagnosis not present

## 2022-03-06 DIAGNOSIS — I10 Essential (primary) hypertension: Secondary | ICD-10-CM | POA: Diagnosis not present

## 2022-03-06 DIAGNOSIS — R7303 Prediabetes: Secondary | ICD-10-CM | POA: Diagnosis not present

## 2022-03-06 DIAGNOSIS — Z96652 Presence of left artificial knee joint: Secondary | ICD-10-CM | POA: Diagnosis not present

## 2022-03-06 DIAGNOSIS — M797 Fibromyalgia: Secondary | ICD-10-CM | POA: Diagnosis not present

## 2022-03-06 DIAGNOSIS — K219 Gastro-esophageal reflux disease without esophagitis: Secondary | ICD-10-CM | POA: Diagnosis not present

## 2022-03-06 DIAGNOSIS — Z9181 History of falling: Secondary | ICD-10-CM | POA: Diagnosis not present

## 2022-03-06 DIAGNOSIS — Z471 Aftercare following joint replacement surgery: Secondary | ICD-10-CM | POA: Diagnosis not present

## 2022-03-06 DIAGNOSIS — I69359 Hemiplegia and hemiparesis following cerebral infarction affecting unspecified side: Secondary | ICD-10-CM | POA: Diagnosis not present

## 2022-03-06 DIAGNOSIS — Z7982 Long term (current) use of aspirin: Secondary | ICD-10-CM | POA: Diagnosis not present

## 2022-03-06 DIAGNOSIS — M1612 Unilateral primary osteoarthritis, left hip: Secondary | ICD-10-CM | POA: Diagnosis not present

## 2022-03-06 DIAGNOSIS — M48061 Spinal stenosis, lumbar region without neurogenic claudication: Secondary | ICD-10-CM | POA: Diagnosis not present

## 2022-03-06 DIAGNOSIS — Z7902 Long term (current) use of antithrombotics/antiplatelets: Secondary | ICD-10-CM | POA: Diagnosis not present

## 2022-03-06 DIAGNOSIS — G2581 Restless legs syndrome: Secondary | ICD-10-CM | POA: Diagnosis not present

## 2022-03-06 DIAGNOSIS — Z792 Long term (current) use of antibiotics: Secondary | ICD-10-CM | POA: Diagnosis not present

## 2022-03-08 DIAGNOSIS — I69359 Hemiplegia and hemiparesis following cerebral infarction affecting unspecified side: Secondary | ICD-10-CM | POA: Diagnosis not present

## 2022-03-08 DIAGNOSIS — M797 Fibromyalgia: Secondary | ICD-10-CM | POA: Diagnosis not present

## 2022-03-08 DIAGNOSIS — Z471 Aftercare following joint replacement surgery: Secondary | ICD-10-CM | POA: Diagnosis not present

## 2022-03-08 DIAGNOSIS — M48061 Spinal stenosis, lumbar region without neurogenic claudication: Secondary | ICD-10-CM | POA: Diagnosis not present

## 2022-03-08 DIAGNOSIS — Z7982 Long term (current) use of aspirin: Secondary | ICD-10-CM | POA: Diagnosis not present

## 2022-03-08 DIAGNOSIS — Z792 Long term (current) use of antibiotics: Secondary | ICD-10-CM | POA: Diagnosis not present

## 2022-03-08 DIAGNOSIS — E785 Hyperlipidemia, unspecified: Secondary | ICD-10-CM | POA: Diagnosis not present

## 2022-03-08 DIAGNOSIS — M1612 Unilateral primary osteoarthritis, left hip: Secondary | ICD-10-CM | POA: Diagnosis not present

## 2022-03-08 DIAGNOSIS — Z7902 Long term (current) use of antithrombotics/antiplatelets: Secondary | ICD-10-CM | POA: Diagnosis not present

## 2022-03-08 DIAGNOSIS — Z96651 Presence of right artificial knee joint: Secondary | ICD-10-CM | POA: Diagnosis not present

## 2022-03-08 DIAGNOSIS — G2581 Restless legs syndrome: Secondary | ICD-10-CM | POA: Diagnosis not present

## 2022-03-08 DIAGNOSIS — Z96652 Presence of left artificial knee joint: Secondary | ICD-10-CM | POA: Diagnosis not present

## 2022-03-08 DIAGNOSIS — I10 Essential (primary) hypertension: Secondary | ICD-10-CM | POA: Diagnosis not present

## 2022-03-08 DIAGNOSIS — K219 Gastro-esophageal reflux disease without esophagitis: Secondary | ICD-10-CM | POA: Diagnosis not present

## 2022-03-08 DIAGNOSIS — Z9181 History of falling: Secondary | ICD-10-CM | POA: Diagnosis not present

## 2022-03-08 DIAGNOSIS — R7303 Prediabetes: Secondary | ICD-10-CM | POA: Diagnosis not present

## 2022-03-08 DIAGNOSIS — Z87891 Personal history of nicotine dependence: Secondary | ICD-10-CM | POA: Diagnosis not present

## 2022-03-10 DIAGNOSIS — Z96651 Presence of right artificial knee joint: Secondary | ICD-10-CM | POA: Diagnosis not present

## 2022-03-10 DIAGNOSIS — I69359 Hemiplegia and hemiparesis following cerebral infarction affecting unspecified side: Secondary | ICD-10-CM | POA: Diagnosis not present

## 2022-03-10 DIAGNOSIS — G2581 Restless legs syndrome: Secondary | ICD-10-CM | POA: Diagnosis not present

## 2022-03-10 DIAGNOSIS — M797 Fibromyalgia: Secondary | ICD-10-CM | POA: Diagnosis not present

## 2022-03-10 DIAGNOSIS — M1612 Unilateral primary osteoarthritis, left hip: Secondary | ICD-10-CM | POA: Diagnosis not present

## 2022-03-10 DIAGNOSIS — Z7902 Long term (current) use of antithrombotics/antiplatelets: Secondary | ICD-10-CM | POA: Diagnosis not present

## 2022-03-10 DIAGNOSIS — Z471 Aftercare following joint replacement surgery: Secondary | ICD-10-CM | POA: Diagnosis not present

## 2022-03-10 DIAGNOSIS — Z792 Long term (current) use of antibiotics: Secondary | ICD-10-CM | POA: Diagnosis not present

## 2022-03-10 DIAGNOSIS — E785 Hyperlipidemia, unspecified: Secondary | ICD-10-CM | POA: Diagnosis not present

## 2022-03-10 DIAGNOSIS — K219 Gastro-esophageal reflux disease without esophagitis: Secondary | ICD-10-CM | POA: Diagnosis not present

## 2022-03-10 DIAGNOSIS — Z96652 Presence of left artificial knee joint: Secondary | ICD-10-CM | POA: Diagnosis not present

## 2022-03-10 DIAGNOSIS — Z87891 Personal history of nicotine dependence: Secondary | ICD-10-CM | POA: Diagnosis not present

## 2022-03-10 DIAGNOSIS — I10 Essential (primary) hypertension: Secondary | ICD-10-CM | POA: Diagnosis not present

## 2022-03-10 DIAGNOSIS — Z9181 History of falling: Secondary | ICD-10-CM | POA: Diagnosis not present

## 2022-03-10 DIAGNOSIS — Z7982 Long term (current) use of aspirin: Secondary | ICD-10-CM | POA: Diagnosis not present

## 2022-03-10 DIAGNOSIS — R7303 Prediabetes: Secondary | ICD-10-CM | POA: Diagnosis not present

## 2022-03-10 DIAGNOSIS — M48061 Spinal stenosis, lumbar region without neurogenic claudication: Secondary | ICD-10-CM | POA: Diagnosis not present

## 2022-03-13 DIAGNOSIS — Z9181 History of falling: Secondary | ICD-10-CM | POA: Diagnosis not present

## 2022-03-13 DIAGNOSIS — Z7982 Long term (current) use of aspirin: Secondary | ICD-10-CM | POA: Diagnosis not present

## 2022-03-13 DIAGNOSIS — Z96652 Presence of left artificial knee joint: Secondary | ICD-10-CM | POA: Diagnosis not present

## 2022-03-13 DIAGNOSIS — I10 Essential (primary) hypertension: Secondary | ICD-10-CM | POA: Diagnosis not present

## 2022-03-13 DIAGNOSIS — Z7902 Long term (current) use of antithrombotics/antiplatelets: Secondary | ICD-10-CM | POA: Diagnosis not present

## 2022-03-13 DIAGNOSIS — M797 Fibromyalgia: Secondary | ICD-10-CM | POA: Diagnosis not present

## 2022-03-13 DIAGNOSIS — M48061 Spinal stenosis, lumbar region without neurogenic claudication: Secondary | ICD-10-CM | POA: Diagnosis not present

## 2022-03-13 DIAGNOSIS — I69359 Hemiplegia and hemiparesis following cerebral infarction affecting unspecified side: Secondary | ICD-10-CM | POA: Diagnosis not present

## 2022-03-13 DIAGNOSIS — Z87891 Personal history of nicotine dependence: Secondary | ICD-10-CM | POA: Diagnosis not present

## 2022-03-13 DIAGNOSIS — E785 Hyperlipidemia, unspecified: Secondary | ICD-10-CM | POA: Diagnosis not present

## 2022-03-13 DIAGNOSIS — G2581 Restless legs syndrome: Secondary | ICD-10-CM | POA: Diagnosis not present

## 2022-03-13 DIAGNOSIS — M1612 Unilateral primary osteoarthritis, left hip: Secondary | ICD-10-CM | POA: Diagnosis not present

## 2022-03-13 DIAGNOSIS — Z792 Long term (current) use of antibiotics: Secondary | ICD-10-CM | POA: Diagnosis not present

## 2022-03-13 DIAGNOSIS — K219 Gastro-esophageal reflux disease without esophagitis: Secondary | ICD-10-CM | POA: Diagnosis not present

## 2022-03-13 DIAGNOSIS — Z96651 Presence of right artificial knee joint: Secondary | ICD-10-CM | POA: Diagnosis not present

## 2022-03-13 DIAGNOSIS — Z471 Aftercare following joint replacement surgery: Secondary | ICD-10-CM | POA: Diagnosis not present

## 2022-03-13 DIAGNOSIS — R7303 Prediabetes: Secondary | ICD-10-CM | POA: Diagnosis not present

## 2022-03-15 DIAGNOSIS — Z471 Aftercare following joint replacement surgery: Secondary | ICD-10-CM | POA: Diagnosis not present

## 2022-03-15 DIAGNOSIS — Z7902 Long term (current) use of antithrombotics/antiplatelets: Secondary | ICD-10-CM | POA: Diagnosis not present

## 2022-03-15 DIAGNOSIS — Z87891 Personal history of nicotine dependence: Secondary | ICD-10-CM | POA: Diagnosis not present

## 2022-03-15 DIAGNOSIS — M1612 Unilateral primary osteoarthritis, left hip: Secondary | ICD-10-CM | POA: Diagnosis not present

## 2022-03-15 DIAGNOSIS — I69359 Hemiplegia and hemiparesis following cerebral infarction affecting unspecified side: Secondary | ICD-10-CM | POA: Diagnosis not present

## 2022-03-15 DIAGNOSIS — Z96651 Presence of right artificial knee joint: Secondary | ICD-10-CM | POA: Diagnosis not present

## 2022-03-15 DIAGNOSIS — Z792 Long term (current) use of antibiotics: Secondary | ICD-10-CM | POA: Diagnosis not present

## 2022-03-15 DIAGNOSIS — M48061 Spinal stenosis, lumbar region without neurogenic claudication: Secondary | ICD-10-CM | POA: Diagnosis not present

## 2022-03-15 DIAGNOSIS — Z96652 Presence of left artificial knee joint: Secondary | ICD-10-CM | POA: Diagnosis not present

## 2022-03-15 DIAGNOSIS — K219 Gastro-esophageal reflux disease without esophagitis: Secondary | ICD-10-CM | POA: Diagnosis not present

## 2022-03-15 DIAGNOSIS — Z9181 History of falling: Secondary | ICD-10-CM | POA: Diagnosis not present

## 2022-03-15 DIAGNOSIS — G2581 Restless legs syndrome: Secondary | ICD-10-CM | POA: Diagnosis not present

## 2022-03-15 DIAGNOSIS — Z7982 Long term (current) use of aspirin: Secondary | ICD-10-CM | POA: Diagnosis not present

## 2022-03-15 DIAGNOSIS — E785 Hyperlipidemia, unspecified: Secondary | ICD-10-CM | POA: Diagnosis not present

## 2022-03-15 DIAGNOSIS — I10 Essential (primary) hypertension: Secondary | ICD-10-CM | POA: Diagnosis not present

## 2022-03-15 DIAGNOSIS — R7303 Prediabetes: Secondary | ICD-10-CM | POA: Diagnosis not present

## 2022-03-15 DIAGNOSIS — M797 Fibromyalgia: Secondary | ICD-10-CM | POA: Diagnosis not present

## 2022-03-17 DIAGNOSIS — M9903 Segmental and somatic dysfunction of lumbar region: Secondary | ICD-10-CM | POA: Diagnosis not present

## 2022-03-17 DIAGNOSIS — M545 Low back pain, unspecified: Secondary | ICD-10-CM | POA: Diagnosis not present

## 2022-03-20 DIAGNOSIS — Z96652 Presence of left artificial knee joint: Secondary | ICD-10-CM | POA: Diagnosis not present

## 2022-03-21 DIAGNOSIS — M5416 Radiculopathy, lumbar region: Secondary | ICD-10-CM | POA: Diagnosis not present

## 2022-03-21 DIAGNOSIS — M48062 Spinal stenosis, lumbar region with neurogenic claudication: Secondary | ICD-10-CM | POA: Diagnosis not present

## 2022-03-22 DIAGNOSIS — Z96652 Presence of left artificial knee joint: Secondary | ICD-10-CM | POA: Diagnosis not present

## 2022-03-28 DIAGNOSIS — Z96652 Presence of left artificial knee joint: Secondary | ICD-10-CM | POA: Diagnosis not present

## 2022-03-29 DIAGNOSIS — Z96652 Presence of left artificial knee joint: Secondary | ICD-10-CM | POA: Diagnosis not present

## 2022-03-29 DIAGNOSIS — M1712 Unilateral primary osteoarthritis, left knee: Secondary | ICD-10-CM | POA: Diagnosis not present

## 2022-03-30 DIAGNOSIS — Z96652 Presence of left artificial knee joint: Secondary | ICD-10-CM | POA: Diagnosis not present

## 2022-04-04 DIAGNOSIS — Z96652 Presence of left artificial knee joint: Secondary | ICD-10-CM | POA: Diagnosis not present

## 2022-04-05 DIAGNOSIS — M797 Fibromyalgia: Secondary | ICD-10-CM | POA: Diagnosis not present

## 2022-04-05 DIAGNOSIS — Z79891 Long term (current) use of opiate analgesic: Secondary | ICD-10-CM | POA: Diagnosis not present

## 2022-04-05 DIAGNOSIS — M47816 Spondylosis without myelopathy or radiculopathy, lumbar region: Secondary | ICD-10-CM | POA: Diagnosis not present

## 2022-04-06 DIAGNOSIS — F32A Depression, unspecified: Secondary | ICD-10-CM | POA: Diagnosis not present

## 2022-04-06 DIAGNOSIS — E039 Hypothyroidism, unspecified: Secondary | ICD-10-CM | POA: Diagnosis not present

## 2022-04-06 DIAGNOSIS — E559 Vitamin D deficiency, unspecified: Secondary | ICD-10-CM | POA: Diagnosis not present

## 2022-04-06 DIAGNOSIS — D519 Vitamin B12 deficiency anemia, unspecified: Secondary | ICD-10-CM | POA: Diagnosis not present

## 2022-04-06 DIAGNOSIS — R5383 Other fatigue: Secondary | ICD-10-CM | POA: Diagnosis not present

## 2022-04-06 DIAGNOSIS — I10 Essential (primary) hypertension: Secondary | ICD-10-CM | POA: Diagnosis not present

## 2022-04-06 DIAGNOSIS — E1165 Type 2 diabetes mellitus with hyperglycemia: Secondary | ICD-10-CM | POA: Diagnosis not present

## 2022-04-06 DIAGNOSIS — E782 Mixed hyperlipidemia: Secondary | ICD-10-CM | POA: Diagnosis not present

## 2022-04-12 DIAGNOSIS — Z96652 Presence of left artificial knee joint: Secondary | ICD-10-CM | POA: Diagnosis not present

## 2022-04-17 DIAGNOSIS — E119 Type 2 diabetes mellitus without complications: Secondary | ICD-10-CM | POA: Diagnosis not present

## 2022-04-20 DIAGNOSIS — M47816 Spondylosis without myelopathy or radiculopathy, lumbar region: Secondary | ICD-10-CM | POA: Diagnosis not present

## 2022-05-04 DIAGNOSIS — M47816 Spondylosis without myelopathy or radiculopathy, lumbar region: Secondary | ICD-10-CM | POA: Diagnosis not present

## 2022-05-15 DIAGNOSIS — I781 Nevus, non-neoplastic: Secondary | ICD-10-CM | POA: Diagnosis not present

## 2022-05-15 DIAGNOSIS — L710 Perioral dermatitis: Secondary | ICD-10-CM | POA: Diagnosis not present

## 2022-05-15 DIAGNOSIS — I10 Essential (primary) hypertension: Secondary | ICD-10-CM | POA: Diagnosis not present

## 2022-05-15 DIAGNOSIS — E1165 Type 2 diabetes mellitus with hyperglycemia: Secondary | ICD-10-CM | POA: Diagnosis not present

## 2022-05-15 DIAGNOSIS — E039 Hypothyroidism, unspecified: Secondary | ICD-10-CM | POA: Diagnosis not present

## 2022-05-18 DIAGNOSIS — M47816 Spondylosis without myelopathy or radiculopathy, lumbar region: Secondary | ICD-10-CM | POA: Diagnosis not present

## 2022-05-23 DIAGNOSIS — M47817 Spondylosis without myelopathy or radiculopathy, lumbosacral region: Secondary | ICD-10-CM | POA: Insufficient documentation

## 2022-05-26 DIAGNOSIS — M1811 Unilateral primary osteoarthritis of first carpometacarpal joint, right hand: Secondary | ICD-10-CM | POA: Diagnosis not present

## 2022-05-26 DIAGNOSIS — M79644 Pain in right finger(s): Secondary | ICD-10-CM | POA: Diagnosis not present

## 2022-05-26 DIAGNOSIS — M87052 Idiopathic aseptic necrosis of left femur: Secondary | ICD-10-CM | POA: Diagnosis not present

## 2022-06-01 DIAGNOSIS — M47816 Spondylosis without myelopathy or radiculopathy, lumbar region: Secondary | ICD-10-CM | POA: Diagnosis not present

## 2022-06-08 ENCOUNTER — Ambulatory Visit
Admission: RE | Admit: 2022-06-08 | Discharge: 2022-06-08 | Disposition: A | Discharge status: Home / Self Care | Payer: Medicare Other | Source: Ambulatory Visit | Attending: Nurse Practitioner | Admitting: Nurse Practitioner

## 2022-06-08 ENCOUNTER — Other Ambulatory Visit: Payer: Self-pay | Admitting: Nurse Practitioner

## 2022-06-08 DIAGNOSIS — R52 Pain, unspecified: Secondary | ICD-10-CM

## 2022-06-08 DIAGNOSIS — M79641 Pain in right hand: Secondary | ICD-10-CM | POA: Diagnosis not present

## 2022-06-08 DIAGNOSIS — M47816 Spondylosis without myelopathy or radiculopathy, lumbar region: Secondary | ICD-10-CM | POA: Diagnosis not present

## 2022-07-05 DIAGNOSIS — M47816 Spondylosis without myelopathy or radiculopathy, lumbar region: Secondary | ICD-10-CM | POA: Diagnosis not present

## 2022-07-07 DIAGNOSIS — I10 Essential (primary) hypertension: Secondary | ICD-10-CM | POA: Diagnosis not present

## 2022-07-07 DIAGNOSIS — D519 Vitamin B12 deficiency anemia, unspecified: Secondary | ICD-10-CM | POA: Diagnosis not present

## 2022-07-07 DIAGNOSIS — R7303 Prediabetes: Secondary | ICD-10-CM | POA: Diagnosis not present

## 2022-07-07 DIAGNOSIS — F32A Depression, unspecified: Secondary | ICD-10-CM | POA: Diagnosis not present

## 2022-07-07 DIAGNOSIS — M797 Fibromyalgia: Secondary | ICD-10-CM | POA: Diagnosis not present

## 2022-07-07 DIAGNOSIS — E1165 Type 2 diabetes mellitus with hyperglycemia: Secondary | ICD-10-CM | POA: Diagnosis not present

## 2022-07-07 DIAGNOSIS — E039 Hypothyroidism, unspecified: Secondary | ICD-10-CM | POA: Diagnosis not present

## 2022-07-07 DIAGNOSIS — E782 Mixed hyperlipidemia: Secondary | ICD-10-CM | POA: Diagnosis not present

## 2022-07-07 DIAGNOSIS — D509 Iron deficiency anemia, unspecified: Secondary | ICD-10-CM | POA: Diagnosis not present

## 2022-07-07 DIAGNOSIS — Z0000 Encounter for general adult medical examination without abnormal findings: Secondary | ICD-10-CM | POA: Diagnosis not present

## 2022-08-03 DIAGNOSIS — M47816 Spondylosis without myelopathy or radiculopathy, lumbar region: Secondary | ICD-10-CM | POA: Diagnosis not present

## 2022-08-31 DIAGNOSIS — Z79891 Long term (current) use of opiate analgesic: Secondary | ICD-10-CM | POA: Diagnosis not present

## 2022-08-31 DIAGNOSIS — M47816 Spondylosis without myelopathy or radiculopathy, lumbar region: Secondary | ICD-10-CM | POA: Diagnosis not present

## 2022-09-18 DIAGNOSIS — E119 Type 2 diabetes mellitus without complications: Secondary | ICD-10-CM | POA: Diagnosis not present

## 2022-10-02 DIAGNOSIS — G8929 Other chronic pain: Secondary | ICD-10-CM | POA: Diagnosis not present

## 2022-10-02 DIAGNOSIS — M1811 Unilateral primary osteoarthritis of first carpometacarpal joint, right hand: Secondary | ICD-10-CM | POA: Diagnosis not present

## 2022-10-02 DIAGNOSIS — Z79891 Long term (current) use of opiate analgesic: Secondary | ICD-10-CM | POA: Diagnosis not present

## 2022-10-06 DIAGNOSIS — E559 Vitamin D deficiency, unspecified: Secondary | ICD-10-CM | POA: Diagnosis not present

## 2022-10-06 DIAGNOSIS — F32A Depression, unspecified: Secondary | ICD-10-CM | POA: Diagnosis not present

## 2022-10-06 DIAGNOSIS — E781 Pure hyperglyceridemia: Secondary | ICD-10-CM | POA: Diagnosis not present

## 2022-10-06 DIAGNOSIS — M545 Low back pain, unspecified: Secondary | ICD-10-CM | POA: Diagnosis not present

## 2022-10-06 DIAGNOSIS — I10 Essential (primary) hypertension: Secondary | ICD-10-CM | POA: Diagnosis not present

## 2022-10-06 DIAGNOSIS — R7303 Prediabetes: Secondary | ICD-10-CM | POA: Diagnosis not present

## 2022-10-06 DIAGNOSIS — M5413 Radiculopathy, cervicothoracic region: Secondary | ICD-10-CM | POA: Diagnosis not present

## 2022-10-06 DIAGNOSIS — E782 Mixed hyperlipidemia: Secondary | ICD-10-CM | POA: Diagnosis not present

## 2022-10-14 DIAGNOSIS — E039 Hypothyroidism, unspecified: Secondary | ICD-10-CM | POA: Diagnosis not present

## 2022-10-14 DIAGNOSIS — I10 Essential (primary) hypertension: Secondary | ICD-10-CM | POA: Diagnosis not present

## 2022-11-27 IMAGING — CT CT KNEE*L* W/O CM
4 of 8 series · 11 of 33 positions shown, 12 images · non-contrast
Comparison: None.

CLINICAL DATA: Preoperative assessment for left knee replacement.

EXAM:
CT OF THE LEFT KNEE WITHOUT CONTRAST
TECHNIQUE: Multidetector CT imaging of the left knee was performed according to
the standard protocol. Multiplanar CT image reconstructions were
also generated.
RADIATION DOSE REDUCTION: This exam was performed according to the
departmental dose-optimization program which includes automated
exposure control, adjustment of the mA and/or kV according to
patient size and/or use of iterative reconstruction technique.

[Series 3: axial bone knee 2.00 · axial · 0.34mm/px · z∈[+1036,+1188]mm · 3 of 154 slices shown, 4 images]
[im 39/154  soft-tissue]
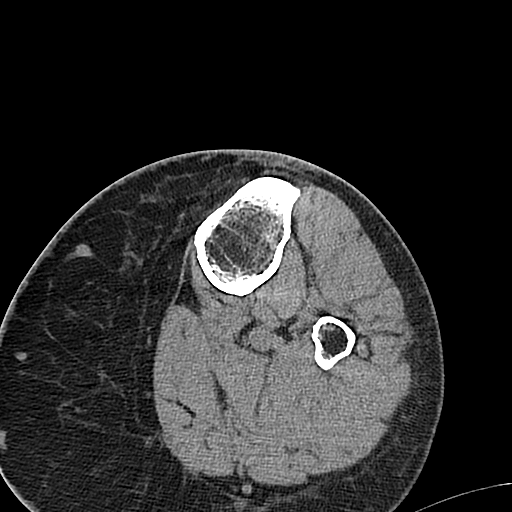
[im 39/154  bone]
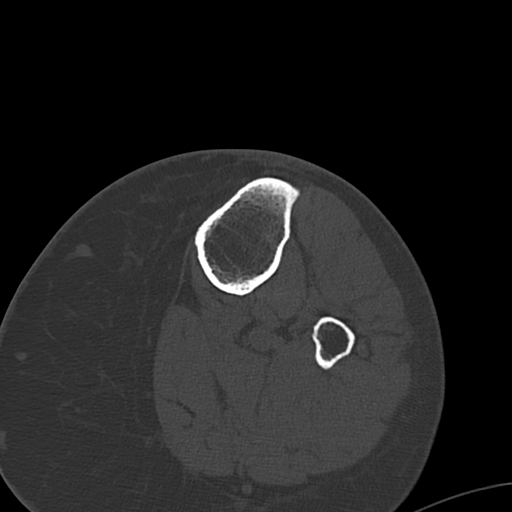
[im 77/154  bone]
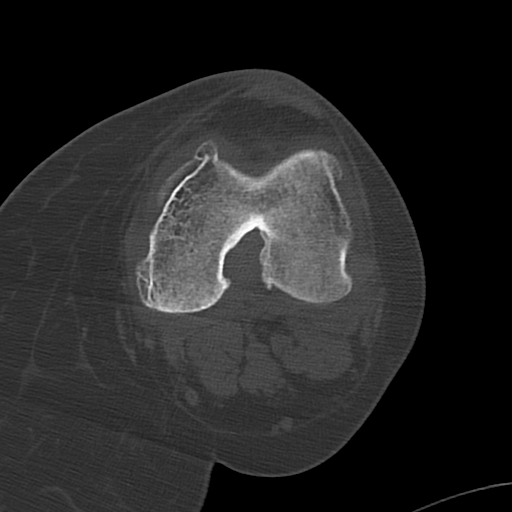
[im 115/154  bone]
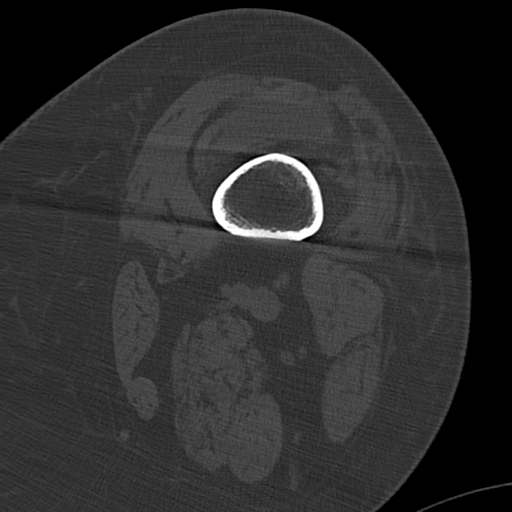

[Series 4: axial st knee 2.00 · axial · 0.34mm/px · z∈[+1036,+1112]mm · 2 of 154 slices shown]
[im 39/154  bone]
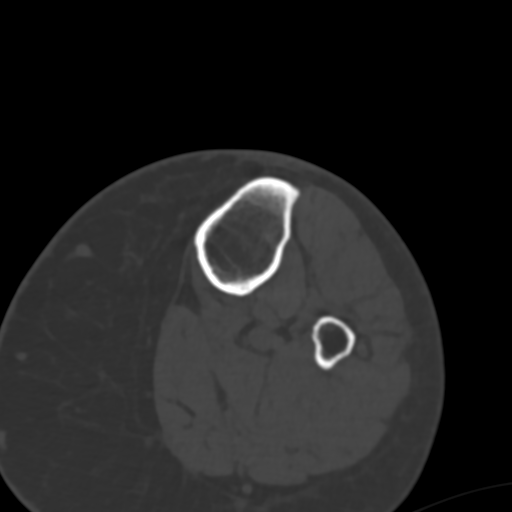
[im 77/154  bone]
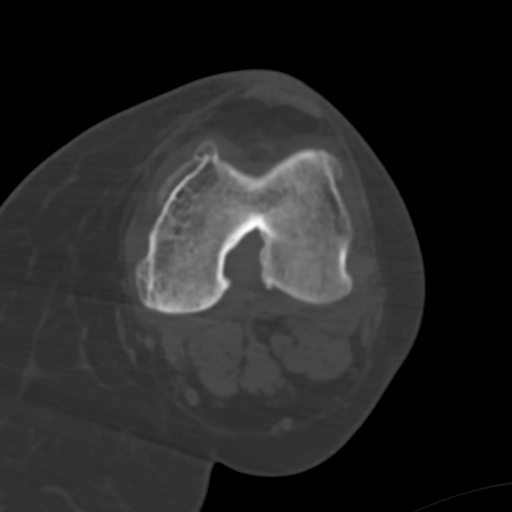

[Series 5: coronal bone knee 2.00 cor · coronal · 0.29mm/px · 1 of 75 slices shown]
[im 38/75  bone]
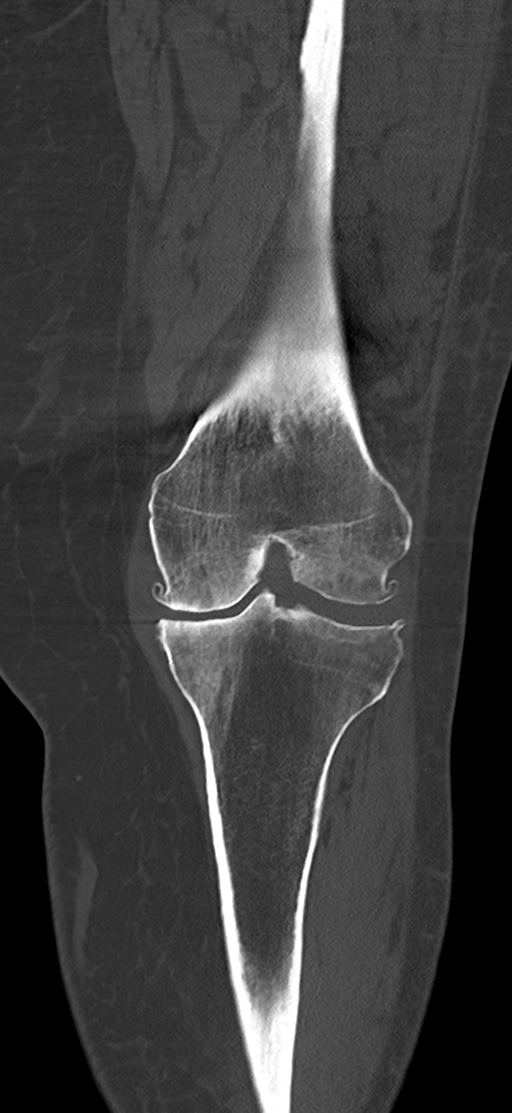

[Series 9: sagittal bone knee 2.00 sag · sagittal · 0.29mm/px · 5 of 75 slices shown]
[im 13/75  bone]
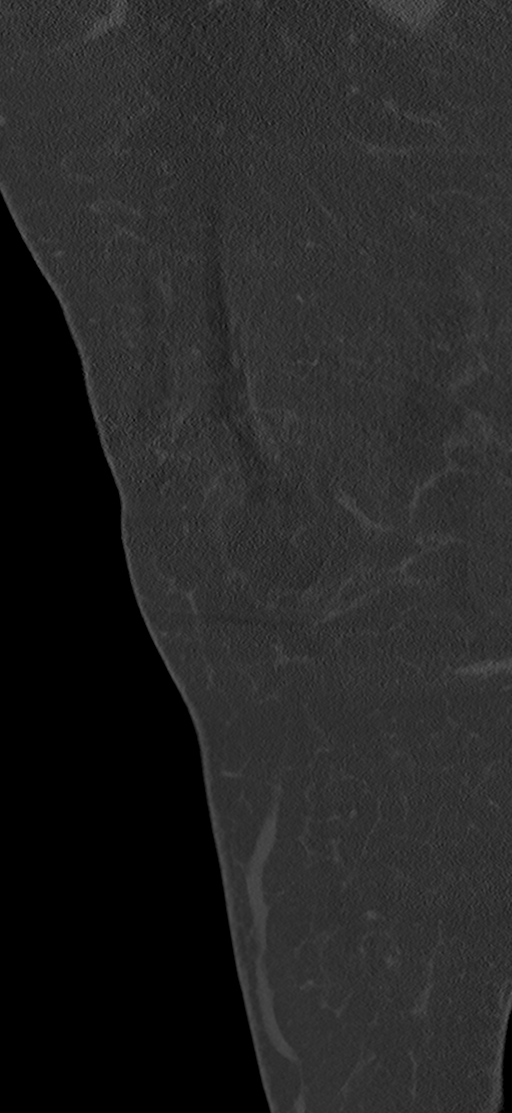
[im 25/75  bone]
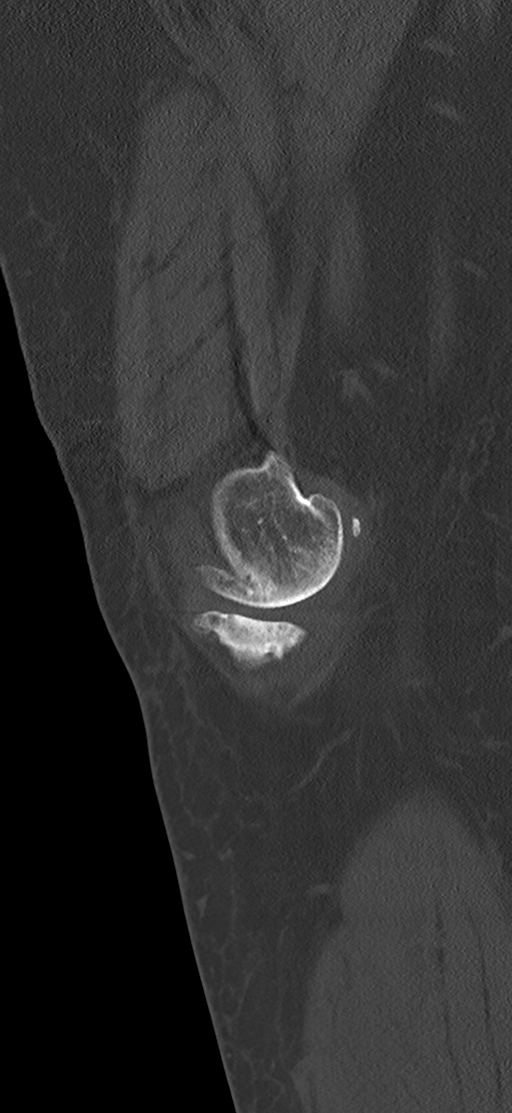
[im 38/75  bone]
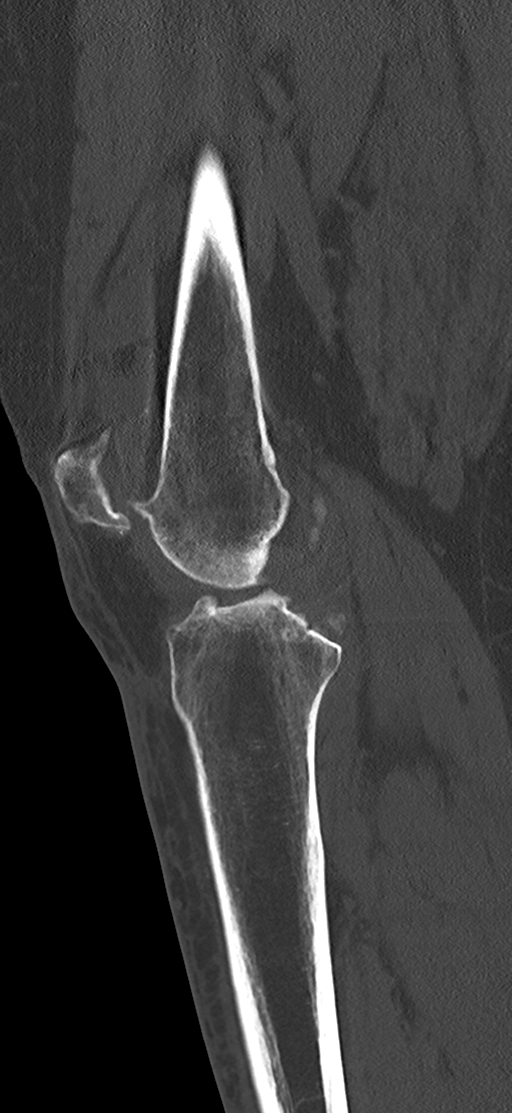
[im 50/75  bone]
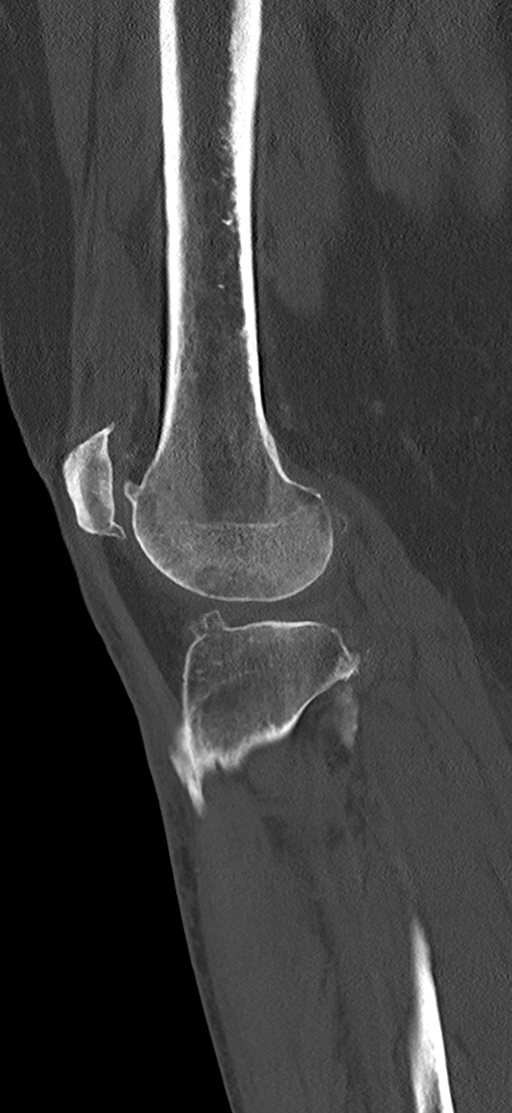
[im 62/75  bone]
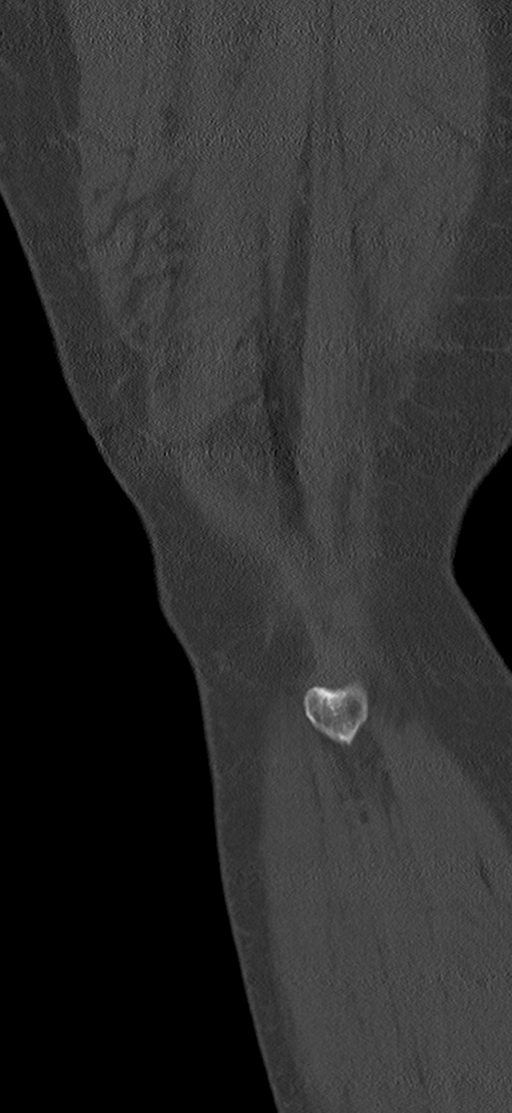

[11 of 33 positions shown; findings below may reference images not displayed]

FINDINGS: Bones/Joint/Cartilage

Hip demonstrates a total left hip arthroplasty without failure or
complication. No fracture or dislocation. No lytic or blastic
lesion.

Knee demonstrates no fracture or dislocation. No lytic or blastic
lesion. Severe osteoarthritis of the right medial femorotibial
compartment. Mild osteoarthritis of the lateral femorotibial
compartment. Moderate osteoarthritis of the patellofemoral
compartment. Large joint effusion.

Ankle demonstrates no fracture or dislocation. No lytic or blastic
lesion.

Ligaments

Ligaments are suboptimally evaluated by CT.

Muscles and Tendons
Muscles are normal. No muscle atrophy. No intramuscular fluid
collection or hematoma.

Soft tissue
No fluid collection or hematoma.  No soft tissue mass.
IMPRESSION: 1. Tricompartmental osteoarthritis of the left knee most severe in
the medial femorotibial compartment.
2. Total left hip arthroplasty without failure or complication.

## 2022-11-29 ENCOUNTER — Telehealth: Payer: Self-pay

## 2022-11-29 ENCOUNTER — Institutional Professional Consult (permissible substitution): Payer: 59 | Admitting: Plastic Surgery

## 2022-11-29 NOTE — Telephone Encounter (Signed)
Patient called stating that the pharmacy is low in stock on the [facility] can you send in another medication?

## 2022-12-04 ENCOUNTER — Encounter: Payer: Self-pay | Admitting: Plastic Surgery

## 2022-12-04 ENCOUNTER — Other Ambulatory Visit: Payer: Self-pay

## 2022-12-04 ENCOUNTER — Ambulatory Visit (INDEPENDENT_AMBULATORY_CARE_PROVIDER_SITE_OTHER): Payer: 59 | Admitting: Plastic Surgery

## 2022-12-04 VITALS — BP 114/56 | HR 65 | Ht 61.0 in | Wt 239.6 lb

## 2022-12-04 DIAGNOSIS — Z96651 Presence of right artificial knee joint: Secondary | ICD-10-CM

## 2022-12-04 DIAGNOSIS — F17210 Nicotine dependence, cigarettes, uncomplicated: Secondary | ICD-10-CM

## 2022-12-04 DIAGNOSIS — I10 Essential (primary) hypertension: Secondary | ICD-10-CM

## 2022-12-04 DIAGNOSIS — M545 Low back pain, unspecified: Secondary | ICD-10-CM

## 2022-12-04 DIAGNOSIS — N62 Hypertrophy of breast: Secondary | ICD-10-CM | POA: Diagnosis not present

## 2022-12-04 DIAGNOSIS — I69359 Hemiplegia and hemiparesis following cerebral infarction affecting unspecified side: Secondary | ICD-10-CM

## 2022-12-04 DIAGNOSIS — M542 Cervicalgia: Secondary | ICD-10-CM

## 2022-12-04 DIAGNOSIS — R21 Rash and other nonspecific skin eruption: Secondary | ICD-10-CM

## 2022-12-04 DIAGNOSIS — R739 Hyperglycemia, unspecified: Secondary | ICD-10-CM

## 2022-12-04 DIAGNOSIS — E119 Type 2 diabetes mellitus without complications: Secondary | ICD-10-CM

## 2022-12-04 DIAGNOSIS — N179 Acute kidney failure, unspecified: Secondary | ICD-10-CM

## 2022-12-04 DIAGNOSIS — M546 Pain in thoracic spine: Secondary | ICD-10-CM | POA: Diagnosis not present

## 2022-12-04 DIAGNOSIS — M87052 Idiopathic aseptic necrosis of left femur: Secondary | ICD-10-CM

## 2022-12-04 DIAGNOSIS — E66813 Obesity, class 3: Secondary | ICD-10-CM

## 2022-12-04 DIAGNOSIS — R531 Weakness: Secondary | ICD-10-CM

## 2022-12-04 DIAGNOSIS — Z1231 Encounter for screening mammogram for malignant neoplasm of breast: Secondary | ICD-10-CM

## 2022-12-04 DIAGNOSIS — I639 Cerebral infarction, unspecified: Secondary | ICD-10-CM

## 2022-12-04 DIAGNOSIS — Z96652 Presence of left artificial knee joint: Secondary | ICD-10-CM

## 2022-12-04 DIAGNOSIS — Z9884 Bariatric surgery status: Secondary | ICD-10-CM

## 2022-12-04 DIAGNOSIS — Z6841 Body Mass Index (BMI) 40.0 and over, adult: Secondary | ICD-10-CM

## 2022-12-04 NOTE — Progress Notes (Signed)
Patient ID: Terri Wood, female    DOB: 08-22-1966, 57 y.o.   MRN: [id number]   Chief Complaint  Patient presents with   Breast Problem   Consult    Mammary Hyperplasia: The patient is a 57 y.o. female with a history of mammary hyperplasia for several years.  She has extremely large breasts causing symptoms that include the following: Back pain in the upper and lower back, including neck pain. She pulls or pins her bra straps to provide better lift and relief of the pressure and pain. She notices relief by holding her breast up manually.  Her shoulder straps cause grooves and pain and pressure that requires padding for relief. Pain medication is sometimes required with motrin and tylenol.  Activities that are hindered by enlarged breasts include: exercise and running.  She has tried supportive clothing as well as fitted bras without improvement.  Her breasts are extremely large with the right breast being longer than the left.  She has hyperpigmentation of the inframammary area on both sides.  The sternal to nipple distance on the right is 40 cm and the left is 35 cm.  The IMF distance is 20 cm.  She is 5 feet 1 inches tall and weighs 239 pounds.  She was 269 before the gastric bypass surgery.  The BMI = 45.2 kg/m.  Preoperative bra size = DDD cup.  She would like to be proportional.  The estimated excess breast tissue to be removed at the time of surgery = 650 g grams on the left and 650 g grams on the right.  Mammogram history: Over a year ago and was negative.  She will need to be sure she has 1 within a year from her surgery.  Family history of breast cancer: Negative.  Tobacco use: Currently smoking.  She does have type 2 diabetes.  She is on Plavix for stroke.   The patient expresses the desire to pursue surgical intervention.  The patient underwent gastric bypass surgery, knee surgery and hip surgery.     Review of Systems  Constitutional: Negative.   HENT: Negative.    Eyes:  Negative.   Respiratory: Negative.  Negative for chest tightness and shortness of breath.   Cardiovascular: Negative.   Gastrointestinal: Negative.   Endocrine: Negative.   Genitourinary: Negative.   Musculoskeletal:  Positive for back pain and neck pain.  Skin:  Positive for rash.    Past Medical History:  Diagnosis Date   Anxiety    Avascular necrosis of left femoral head ([place]) 04/09/2018   Carotid artery stenosis 08/05/2019   right   Cerebrovascular accident (CVA) due to occlusion of right middle cerebral artery ([place]) 05/03/2020   Chronic midline low back pain with right-sided sciatica 02/11/2016   Depression    Fibromyalgia    Foraminal stenosis of lumbar region 02/27/2018   GERD (gastroesophageal reflux disease)    Hyperkalemia 09/13/2017   Hyperlipidemia    Hypertension    Lumbar radiculopathy 12/15/2016   Osteoarthritis of left hip 2019   Pneumonia    01/16/2021 and 06/05/2021   Pre-diabetes    Restless leg syndrome 02/02/2015   Stroke ([place]) 08/05/2019   occlusion of right middle cerebral artery    Past Surgical History:  Procedure Laterality Date   ABDOMINAL SURGERY  1996   gastric bypass; stapling; surgilite   GASTRIC BYPASS OPEN  1996   PLANTAR FASCIA SURGERY Right 2009   TARSAL TUNNEL RELEASE  2009   TOTAL HIP  ARTHROPLASTY Left 04/09/2018   Procedure: TOTAL HIP ARTHROPLASTY ANTERIOR APPROACH;  Surgeon: Hessie Knows, MD;  Location: [facility] ORS;  Service: Orthopedics;  Laterality: Left;   TOTAL KNEE ARTHROPLASTY Right 07/12/2021   Procedure: TOTAL KNEE ARTHROPLASTY;  Surgeon: Hessie Knows, MD;  Location: [facility] OR[facility];  Service: Orthopedics;  Laterality: Right;   TOTAL KNEE ARTHROPLASTY Left 02/16/2022   Procedure: TOTAL KNEE ARTHROPLASTY;  Surgeon: Hessie Knows, MD;  Location: [facility] OR[facility];  Service: Orthopedics;  Laterality: Left;      Current Outpatient Medications:    amLODipine (NORVASC) 10 MG tablet, Take 5 mg by mouth at bedtime., Disp: , Rfl:    aspirin 81 MG  chewable tablet, Chew 1 tablet (81 mg total) by mouth 2 (two) times daily., Disp: 30 tablet, Rfl: 0   atorvastatin (LIPITOR) 80 MG tablet, Take 80 mg by mouth daily., Disp: , Rfl:    baclofen (LIORESAL) 10 MG tablet, Take 5 mg by mouth at bedtime as needed for muscle spasms., Disp: , Rfl:    buPROPion (WELLBUTRIN XL) 150 MG 24 hr tablet, Take 150 mg by mouth daily., Disp: , Rfl:    Calcium Carbonate-Vitamin D (CALTRATE 600+D PO), Take 1 tablet by mouth daily., Disp: , Rfl:    celecoxib (CELEBREX) 200 MG capsule, Take 200 mg by mouth daily., Disp: , Rfl:    Cholecalciferol (VITAMIN D3) 125 MCG (5000 UT) CAPS, Take 5,000 Units by mouth daily., Disp: , Rfl:    cloNIDine (CATAPRES) 0.1 MG tablet, Take 0.1 mg by mouth at bedtime., Disp: , Rfl:    clopidogrel (PLAVIX) 75 MG tablet, Take 75 mg by mouth daily., Disp: , Rfl:    diclofenac Sodium (VOLTAREN) 1 % GEL, Apply 1 application topically 3 (three) times daily as needed (pain)., Disp: , Rfl:    docusate sodium (COLACE) 100 MG capsule, Take 1 capsule (100 mg total) by mouth 2 (two) times daily., Disp: 10 capsule, Rfl: 0   DULoxetine (CYMBALTA) 60 MG capsule, Take 60 mg by mouth 2 (two) times daily., Disp: , Rfl:    GEMTESA 75 MG TABS, Take 1 tablet by mouth daily., Disp: , Rfl:    HYDROcodone-acetaminophen (NORCO) 10-325 MG tablet, Take 1 tablet by mouth every 4 (four) hours as needed., Disp: 30 tablet, Rfl: 0   lisinopril-hydrochlorothiazide (ZESTORETIC) 20-25 MG tablet, Take 1 tablet by mouth daily., Disp: , Rfl:    methocarbamol (ROBAXIN) 500 MG tablet, Take 1 tablet (500 mg total) by mouth every 6 (six) hours as needed for muscle spasms., Disp: 30 tablet, Rfl: 0   minocycline (MINOCIN) 100 MG capsule, Take 100 mg by mouth 2 (two) times daily., Disp: , Rfl:    Multiple Minerals-Vitamins (CAL MAG ZINC +D3) TABS, Take 1 tablet by mouth daily., Disp: , Rfl:    Multiple Vitamins-Minerals (MULTI FOR HER 50+ PO), Take 1 tablet by mouth daily., Disp: ,  Rfl:    Omega 3 1000 MG CAPS, Take 1,000 mg by mouth daily with breakfast. , Disp: , Rfl:    omeprazole (PRILOSEC) 40 MG capsule, Take 40 mg by mouth daily., Disp: , Rfl:    OZEMPIC, 1 MG/DOSE, 4 MG/3ML SOPN, Inject 1 mg into the skin every 7 (seven) days., Disp: , Rfl:    pimecrolimus (ELIDEL) 1 % cream, Apply 1 application. topically in the morning and at bedtime., Disp: , Rfl:    polyethylene glycol (MIRALAX / GLYCOLAX) 17 g packet, Take 17 g by mouth daily as needed for mild constipation., Disp: 14 each,  Rfl: 0   Semaglutide (OZEMPIC, 2 MG/DOSE, [place]), Inject 2 mg into the skin once a week. Friday, Disp: , Rfl:    Specialty Vitamins Products (MENOPAUSE RELIEF PO), Take 2 tablets by mouth daily. Amberen, Disp: , Rfl:    traMADol (ULTRAM) 50 MG tablet, Take 1 tablet (50 mg total) by mouth every 6 (six) hours., Disp: 30 tablet, Rfl: 0   vortioxetine HBr (TRINTELLIX) 5 MG TABS tablet, Take 5 mg by mouth daily., Disp: , Rfl:    Objective:   Vitals:   12/04/22 1400  BP: (!) 114/56  Pulse: 65  SpO2: 98%    Physical Exam Vitals and nursing note reviewed.  Constitutional:      Appearance: Normal appearance.  HENT:     Head: Normocephalic and atraumatic.  Cardiovascular:     Rate and Rhythm: Normal rate.     Pulses: Normal pulses.  Pulmonary:     Effort: Pulmonary effort is normal.  Abdominal:     Palpations: Abdomen is soft.  Musculoskeletal:        General: No swelling or deformity.  Skin:    Capillary Refill: Capillary refill takes less than 2 seconds.     Coloration: Skin is not jaundiced.     Findings: No bruising.  Neurological:     Mental Status: She is alert and oriented to person, place, and time.  Psychiatric:        Mood and Affect: Mood normal.        Behavior: Behavior normal.        Thought Content: Thought content normal.        Judgment: Judgment normal.     Assessment & Plan:  Acute CVA (cerebrovascular accident) ([place])  Essential hypertension  CVA, old,  hemiparesis ([place])  Avascular necrosis of left femoral head (HCC)  AKI (acute kidney injury) ([place])  Obesity, Class III, BMI 40-49.9 (morbid obesity) ([place]) - Plan: CANCELED: Amb Ref to Medical Weight Management  Borderline hyperglycemia  Generalized weakness  S/P TKR (total knee replacement) using cement, right  S/P TKR (total knee replacement) using cement, left  Breast cancer screening by mammogram - Plan: MM 3D SCREEN BREAST BILATERAL  The procedure the patient selected and that was best for the patient was discussed. The risk were discussed and include but not limited to the following:  Breast asymmetry, fluid accumulation, firmness of the breast, inability to breast feed, loss of nipple or areola, skin loss, change in skin and nipple sensation, fat necrosis of the breast tissue, bleeding, infection and healing delay.  There are risks of anesthesia and injury to nerves or blood vessels.  Allergic reaction to tape, suture and skin glue are possible.  There will be swelling.  Any of these can lead to the need for revisional surgery.  A breast reduction has potential to interfere with diagnostic procedures in the future.  This procedure is best done when the breast is fully developed.  Changes in the breast will continue to occur over time: pregnancy, weight gain or weigh loss.    Total time: 40 minutes. This includes time spent with the patient during the visit as well as time spent before and after the visit reviewing the chart, documenting the encounter, ordering pertinent studies and literature for the patient.    The patient will have to stop smoking and be tobacco free for a minimum of 3 months before considering the surgery.  She is at a very high risk of complications so I am not  committing to do the surgery but she does have to be 3 months tobacco free.  She needs to have a hemoglobin A1c less than 6.5. Mammogram needs to be updated and she may or may not need physical therapy but were  not can order that until she is tobacco free.  She is to give [place] a call when she is 3 months tobacco free.  Pictures were obtained of the patient and placed in the chart with the patient's or guardian's permission.   [place], DO

## 2022-12-12 ENCOUNTER — Ambulatory Visit
Admission: EM | Admit: 2022-12-12 | Discharge: 2022-12-12 | Disposition: A | Discharge status: Home / Self Care | Payer: 59 | Attending: Urgent Care | Admitting: Urgent Care

## 2022-12-12 DIAGNOSIS — R6889 Other general symptoms and signs: Secondary | ICD-10-CM | POA: Diagnosis present

## 2022-12-12 DIAGNOSIS — Z1152 Encounter for screening for COVID-19: Secondary | ICD-10-CM | POA: Insufficient documentation

## 2022-12-12 MED ORDER — OSELTAMIVIR PHOSPHATE 75 MG PO CAPS: 75.0000 mg | ORAL_CAPSULE | Freq: Two times a day (BID) | ORAL | 0 refills | Status: DC

## 2022-12-12 MED ORDER — ONDANSETRON 4 MG PO TBDP: 4.0000 mg | ORAL_TABLET | Freq: Three times a day (TID) | ORAL | 0 refills | Status: DC | PRN

## 2022-12-12 NOTE — ED Provider Notes (Signed)
[facility]    CSN: [id number] Arrival date & time: 12/12/22  [id number]      History   Chief Complaint Chief Complaint  Patient presents with   Nausea    HPI Terri Wood is a 57 y.o. female.   HPI  Presents to urgent care for flu-like symptoms including chills yesterday, nausea since Sunday. Some nasal congestion. Denies cough or other respiratory symptoms. Denies fever.  Past Medical History:  Diagnosis Date   Anxiety    Avascular necrosis of left femoral head ([place]) 04/09/2018   Carotid artery stenosis 08/05/2019   right   Cerebrovascular accident (CVA) due to occlusion of right middle cerebral artery ([place]) 05/03/2020   Chronic midline low back pain with right-sided sciatica 02/11/2016   Depression    Fibromyalgia    Foraminal stenosis of lumbar region 02/27/2018   GERD (gastroesophageal reflux disease)    Hyperkalemia 09/13/2017   Hyperlipidemia    Hypertension    Lumbar radiculopathy 12/15/2016   Osteoarthritis of left hip 2019   Pneumonia    01/16/2021 and 06/05/2021   Pre-diabetes    Restless leg syndrome 02/02/2015   Stroke ([place]) 08/05/2019   occlusion of right middle cerebral artery    Patient Active Problem List   Diagnosis Date Noted   S/P TKR (total knee replacement) using cement, left 02/16/2022   S/P TKR (total knee replacement) using cement, right 07/12/2021   Multifocal pneumonia 06/05/2021   AKI (acute kidney injury) ([place]) 06/05/2021   Borderline hyperglycemia 06/05/2021   Generalized weakness 06/05/2021   Acute respiratory failure ([place]) 01/16/2021   Community acquired pneumonia 01/16/2021   CVA, old, hemiparesis ([place]) 01/16/2021   Obesity, Class III, BMI 40-49.9 (morbid obesity) ([place]) 01/16/2021   Sepsis ([place]) 01/16/2021   Depression 01/16/2021   Essential hypertension 08/16/2019   Fibromyalgia 08/16/2019   Hyperlipidemia    Smoker    Acute CVA (cerebrovascular accident) ([place]) 08/15/2019   Avascular necrosis of left femoral head  ([place]) 04/09/2018   Depression, unspecified 02/02/2015    Past Surgical History:  Procedure Laterality Date   ABDOMINAL SURGERY  1996   gastric bypass; stapling; surgilite   GASTRIC BYPASS OPEN  1996   PLANTAR FASCIA SURGERY Right 2009   TARSAL TUNNEL RELEASE  2009   TOTAL HIP ARTHROPLASTY Left 04/09/2018   Procedure: TOTAL HIP ARTHROPLASTY ANTERIOR APPROACH;  Surgeon: Hessie Knows, MD;  Location: [facility];  Service: Orthopedics;  Laterality: Left;   TOTAL KNEE ARTHROPLASTY Right 07/12/2021   Procedure: TOTAL KNEE ARTHROPLASTY;  Surgeon: Hessie Knows, MD;  Location: [facility];  Service: Orthopedics;  Laterality: Right;   TOTAL KNEE ARTHROPLASTY Left 02/16/2022   Procedure: TOTAL KNEE ARTHROPLASTY;  Surgeon: Hessie Knows, MD;  Location: [facility];  Service: Orthopedics;  Laterality: Left;    OB History     Gravida  0   Para  0   Term  0   Preterm  0   AB  0   Living         SAB  0   IAB  0   Ectopic  0   Multiple      Live Births               Home Medications    Prior to Admission medications   Medication Sig Start Date End Date Taking? Authorizing Provider  amLODipine (NORVASC) 10 MG tablet Take 5 mg by mouth at bedtime.    [provider]  aspirin 81 MG chewable  tablet Chew 1 tablet (81 mg total) by mouth 2 (two) times daily. 02/20/22   Duanne Guess, PA-C  atorvastatin (LIPITOR) 80 MG tablet Take 80 mg by mouth daily. 01/31/22   [provider]  baclofen (LIORESAL) 10 MG tablet Take 5 mg by mouth at bedtime as needed for muscle spasms.    [provider]  buPROPion (WELLBUTRIN XL) 150 MG 24 hr tablet Take 150 mg by mouth daily.    [provider]  Calcium Carbonate-Vitamin D (CALTRATE 600+D PO) Take 1 tablet by mouth daily.    [provider]  celecoxib (CELEBREX) 200 MG capsule Take 200 mg by mouth daily. 05/23/21   [provider]  Cholecalciferol (VITAMIN D3) 125 MCG (5000 UT) CAPS Take 5,000 Units  by mouth daily.    [provider]  cloNIDine (CATAPRES) 0.1 MG tablet Take 0.1 mg by mouth at bedtime. 01/31/22   [provider]  clopidogrel (PLAVIX) 75 MG tablet Take 75 mg by mouth daily.    [provider]  diclofenac Sodium (VOLTAREN) 1 % GEL Apply 1 application topically 3 (three) times daily as needed (pain).    [provider]  docusate sodium (COLACE) 100 MG capsule Take 1 capsule (100 mg total) by mouth 2 (two) times daily. 07/13/21   Duanne Guess, PA-C  DULoxetine (CYMBALTA) 60 MG capsule Take 60 mg by mouth 2 (two) times daily.    [provider]  GEMTESA 75 MG TABS Take 1 tablet by mouth daily. 06/03/21   [provider]  HYDROcodone-acetaminophen (NORCO) 10-325 MG tablet Take 1 tablet by mouth every 4 (four) hours as needed. 02/21/22   Duanne Guess, PA-C  lisinopril-hydrochlorothiazide (ZESTORETIC) 20-25 MG tablet Take 1 tablet by mouth daily. 01/31/22   [provider]  methocarbamol (ROBAXIN) 500 MG tablet Take 1 tablet (500 mg total) by mouth every 6 (six) hours as needed for muscle spasms. 07/13/21   Duanne Guess, PA-C  minocycline (MINOCIN) 100 MG capsule Take 100 mg by mouth 2 (two) times daily. 02/02/22   [provider]  Multiple Minerals-Vitamins (CAL MAG ZINC +D3) TABS Take 1 tablet by mouth daily.    [provider]  Multiple Vitamins-Minerals (MULTI FOR HER 50+ PO) Take 1 tablet by mouth daily.    [provider]  Omega 3 1000 MG CAPS Take 1,000 mg by mouth daily with breakfast.     [provider]  omeprazole (PRILOSEC) 40 MG capsule Take 40 mg by mouth daily. 05/23/21   [provider]  OZEMPIC, 1 MG/DOSE, 4 MG/3ML SOPN Inject 1 mg into the skin every 7 (seven) days. 01/24/22   [provider]  pimecrolimus (ELIDEL) 1 % cream Apply 1 application. topically in the morning and at bedtime. 01/30/22   [provider]  polyethylene glycol (MIRALAX /  GLYCOLAX) 17 g packet Take 17 g by mouth daily as needed for mild constipation. 07/13/21   Duanne Guess, PA-C  Semaglutide (OZEMPIC, 2 MG/DOSE, [place]) Inject 2 mg into the skin once a week. Friday    [provider]  Specialty Vitamins Products (MENOPAUSE RELIEF PO) Take 2 tablets by mouth daily. Amberen    [provider]  traMADol (ULTRAM) 50 MG tablet Take 1 tablet (50 mg total) by mouth every 6 (six) hours. 02/17/22   Reche Dixon, PA-C  vortioxetine HBr (TRINTELLIX) 5 MG TABS tablet Take 5 mg by mouth daily.    [provider]  Family History Family History  Problem Relation Age of Onset   Aneurysm Mother    Heart disease Father     Social History Social History   Tobacco Use   Smoking status: Former    Packs/day: 1.00    Types: Cigarettes    Quit date: 07/2019    Years since quitting: 3.4   Smokeless tobacco: Never  Vaping Use   Vaping Use: Never used  Substance Use Topics   Alcohol use: Not Currently   Drug use: Not Currently    Types: Marijuana    Comment: none since 2019     Allergies   Penicillins, Levofloxacin, Oxycodone, and Vilazodone   Review of Systems Review of Systems   Physical Exam Triage Vital Signs ED Triage Vitals  Enc Vitals Group     BP 12/12/22 1014 119/76     Pulse Rate 12/12/22 1014 71     Resp 12/12/22 1014 18     Temp 12/12/22 1014 98.3 F (36.8 C)     Temp Source 12/12/22 1014 Oral     SpO2 12/12/22 1014 95 %     Weight --      Height --      Head Circumference --      Peak Flow --      Pain Score 12/12/22 1013 0     Pain Loc --      Pain Edu? --      Excl. in [place]? --    No data found.  Updated Vital Signs BP 119/76 (BP Location: Left Arm)   Pulse 71   Temp 98.3 F (36.8 C) (Oral)   Resp 18   LMP 04/01/2018 (Exact Date)   SpO2 95%   Visual Acuity Right Eye Distance:   Left Eye Distance:   Bilateral Distance:    Right Eye Near:   Left Eye Near:    Bilateral Near:     Physical  Exam Constitutional:      Appearance: Normal appearance. She is ill-appearing.  Cardiovascular:     Rate and Rhythm: Normal rate and regular rhythm.     Pulses: Normal pulses.     Heart sounds: Normal heart sounds.  Pulmonary:     Effort: Pulmonary effort is normal.     Breath sounds: Normal breath sounds.  Skin:    General: Skin is warm and dry.  Neurological:     General: No focal deficit present.     Mental Status: She is alert and oriented to person, place, and time.  Psychiatric:        Mood and Affect: Mood normal.        Behavior: Behavior normal.      UC Treatments / Results  Labs (all labs ordered are listed, but only abnormal results are displayed) Labs Reviewed - No data to display  EKG   Radiology No results found.  Procedures Procedures (including critical care time)  Medications Ordered in UC Medications - No data to display  Initial Impression / Assessment and Plan / UC Course  I have reviewed the triage vital signs and the nursing notes.  Pertinent labs & imaging results that were available during my care of the patient were reviewed by me and considered in my medical decision making (see chart for details).   Patient is afebrile here without recent antipyretics. Satting well on room air. Overall is ill appearing, well hydrated, without respiratory distress. Pulmonary exam is unremarkable.  Lungs CTAB without wheezing, rhonchi, rales.  Symptoms are consistent with an acute viral process including influenza or COVID.  Since she is within the treatment window for flu, will give Tamiflu whilst awaiting results of COVID swab.  Otherwise recommending use of OTC medication for symptom control.    Final Clinical Impressions(s) / UC Diagnoses   Final diagnoses:  None   Discharge Instructions   None    ED Prescriptions   None    PDMP not reviewed this encounter.   Rose Phi, [place] 12/12/22 1029

## 2022-12-12 NOTE — Discharge Instructions (Addendum)
You have been diagnosed with a viral upper respiratory infection based on your symptoms and exam. Viral illnesses cannot be treated with antibiotics - they are self limiting - and you should find your symptoms resolving within a few days. Get plenty of rest and non-caffeinated fluids. Watch for signs of dehydration including reduced urine output and dark colored urine.  We have performed a respiratory swab testing for COVID. I have prescribed Tamiflu, antiviral therapy for influenza A, based on a presumptive diagnosis of influenza.  Someone will contact you after results of your swab are available with instructions to continue or stop this medication.If the results of this testing are positive, someone will call you if you are eligible for any antiviral treatment.    Please consult your primary care or specialty provider regarding which of the following medications might be appropriate to treat your symptoms.  Acetaminophen (Tylenol), ibuprofen (Advil/Motrin) or naproxen (Aleve) may be used for throat pain, fever, chills or body aches. You may combine use of acetaminophen and ibuprofen/naproxen if needed.  Some patients find an pain-relieving throat spray such as Chloraseptic to be effective.  Also recommend cold/cough medication containing a cough suppressant such as dextromethorphan, as needed. Please note that some cough medications are not recommended if you suffer from hypertension.    Saline mist spray is helpful for removing excess mucus from your nose.  Room humidifiers are helpful to ease breathing at night. I recommend guaifenesin (Mucinex) with plenty of water throughout the day to help thin and loosen mucus secretions in your respiratory passages.   You might also find relief of nasal/sinus congestion symptoms by using a nasal decongestant such as fluticasone (Flonase ) or pseudoephedrine (Sudafed sinus).  You will need to obtain Sudafed from behind the pharmacist counter.  Speak to the  pharmacist to verify that you are not duplicating medications with other over-the-counter formulations that you may be using.

## 2022-12-12 NOTE — ED Triage Notes (Signed)
Patient presents to [facility] for chills yesterday, nausea since Sunday. Treating symptoms with nyquil and dayquil.

## 2022-12-13 LAB — SARS CORONAVIRUS 2 (TAT 6-24 HRS): SARS Coronavirus 2: NEGATIVE

## 2022-12-26 ENCOUNTER — Other Ambulatory Visit: Payer: Self-pay | Admitting: Family

## 2023-01-08 ENCOUNTER — Ambulatory Visit (INDEPENDENT_AMBULATORY_CARE_PROVIDER_SITE_OTHER): Payer: 59 | Admitting: Family

## 2023-01-08 ENCOUNTER — Other Ambulatory Visit: Payer: Self-pay

## 2023-01-08 ENCOUNTER — Encounter: Payer: Self-pay | Admitting: Family

## 2023-01-08 VITALS — BP 118/64 | HR 71 | Ht 61.0 in | Wt 232.0 lb

## 2023-01-08 DIAGNOSIS — E782 Mixed hyperlipidemia: Secondary | ICD-10-CM | POA: Diagnosis not present

## 2023-01-08 DIAGNOSIS — E039 Hypothyroidism, unspecified: Secondary | ICD-10-CM | POA: Diagnosis not present

## 2023-01-08 DIAGNOSIS — E538 Deficiency of other specified B group vitamins: Secondary | ICD-10-CM

## 2023-01-08 DIAGNOSIS — E1165 Type 2 diabetes mellitus with hyperglycemia: Secondary | ICD-10-CM | POA: Diagnosis not present

## 2023-01-08 DIAGNOSIS — E559 Vitamin D deficiency, unspecified: Secondary | ICD-10-CM | POA: Insufficient documentation

## 2023-01-08 DIAGNOSIS — I10 Essential (primary) hypertension: Secondary | ICD-10-CM

## 2023-01-08 MED ORDER — AZELASTINE HCL 0.1 % NA SOLN: 2.0000 | Freq: Two times a day (BID) | NASAL | 12 refills | Status: AC

## 2023-01-08 MED ORDER — MOUNJARO 12.5 MG/0.5ML ~~LOC~~ SOAJ
12.5000 mg | SUBCUTANEOUS | 3 refills | Status: DC
  Filled 2023-01-08: qty 2, 28d supply, fill #0

## 2023-01-08 NOTE — Progress Notes (Signed)
Established Patient Office Visit  Subjective:  Patient ID: Terri Wood, female    DOB: 03/19/1966  Age: 57 y.o. MRN: [id number]  Chief Complaint  Patient presents with   Follow-up    3 month follow up    Patient is here today for her 3 months follow up.  She has been feeling well since last appointment.   She does have additional concerns to discuss today.   She would like to switch her lyrica to gabapentin so that she can set up for automatic refills, as she is not doing well with making sure that she has it in advance and has been having withdrawal symptoms.  Labs are due today. She needs refills.   I have reviewed her active problem list, medication list, allergies, social history, notes from last encounter for her appointment today.   No other concerns at this time.   Past Medical History:  Diagnosis Date   Acute respiratory failure ([place]) 01/16/2021   AKI (acute kidney injury) ([place]) 06/05/2021   Anxiety    Avascular necrosis of left femoral head ([place]) 04/09/2018   Carotid artery stenosis 08/05/2019   right   Cerebrovascular accident (CVA) due to occlusion of right middle cerebral artery ([place]) 05/03/2020   Chronic midline low back pain with right-sided sciatica 02/11/2016   Community acquired pneumonia 01/16/2021   Depression    Fibromyalgia    Foraminal stenosis of lumbar region 02/27/2018   GERD (gastroesophageal reflux disease)    Hyperkalemia 09/13/2017   Hyperlipidemia    Hypertension    Lumbar radiculopathy 12/15/2016   Multifocal pneumonia 06/05/2021   Osteoarthritis of left hip 2019   Pneumonia    01/16/2021 and 06/05/2021   Pre-diabetes    Restless leg syndrome 02/02/2015   Stroke ([place]) 08/05/2019   occlusion of right middle cerebral artery    Past Surgical History:  Procedure Laterality Date   ABDOMINAL SURGERY  1996   gastric bypass; stapling; surgilite   GASTRIC BYPASS OPEN  1996   PLANTAR FASCIA SURGERY Right 2009   TARSAL TUNNEL RELEASE   2009   TOTAL HIP ARTHROPLASTY Left 04/09/2018   Procedure: TOTAL HIP ARTHROPLASTY ANTERIOR APPROACH;  Surgeon: Hessie Knows, MD;  Location: [facility] ORS;  Service: Orthopedics;  Laterality: Left;   TOTAL KNEE ARTHROPLASTY Right 07/12/2021   Procedure: TOTAL KNEE ARTHROPLASTY;  Surgeon: Hessie Knows, MD;  Location: [facility] OR[facility];  Service: Orthopedics;  Laterality: Right;   TOTAL KNEE ARTHROPLASTY Left 02/16/2022   Procedure: TOTAL KNEE ARTHROPLASTY;  Surgeon: Hessie Knows, MD;  Location: [facility] OR[facility];  Service: Orthopedics;  Laterality: Left;    Social History   Socioeconomic History   Marital status: Legally Separated    Spouse name: Not on file   Number of children: 0   Years of education: Not on file   Highest education level: Not on file  Occupational History   Not on file  Tobacco Use   Smoking status: Former    Packs/day: 1    Types: Cigarettes    Quit date: 07/2019    Years since quitting: 3.4   Smokeless tobacco: Never  Vaping Use   Vaping Use: Never used  Substance and Sexual Activity   Alcohol use: Not Currently   Drug use: Not Currently    Types: Marijuana    Comment: none since 2019   Sexual activity: Not Currently  Other Topics Concern   Not on file  Social History Narrative   Lives with mother   Social Determinants  of Health   Financial Resource Strain: Not on file  Food Insecurity: Not on file  Transportation Needs: Not on file  Physical Activity: Not on file  Stress: Not on file  Social Connections: Not on file  Intimate Partner Violence: Not on file    Family History  Problem Relation Age of Onset   Aneurysm Mother    Heart disease Father     Allergies  Allergen Reactions   Penicillins Hives, Other (See Comments) and Rash    TOLERATED ROCEPHIN AND CEFAZOLIN  Did it involve swelling of the face/tongue/throat, SOB, or low BP? Yes  Did it involve sudden or severe rash/hives, skin peeling, or any reaction on the inside of your mouth or nose? No  Did  you need to seek medical attention at a hospital or doctor's office? No  When did it last happen? Within the past 10 years   If all above answers are "NO", may proceed with cephalosporin use.  Did it involve swelling of the face/tongue/throat, SOB, or low BP? Yes  Did it involve sudden or severe rash/hives, skin peeling, or any reaction on the inside of your mouth or nose? No  Did you need to seek medical attention at a hospital or doctor's office? No  When did it last happen? Within the past 10 years   If all above answers are "NO", may proceed with cephalosporin use.  Has patient had a PCN reaction causing immediate rash, facial/tongue/throat swelling, SOB or lightheadedness with hypotension: Yes  Has patient had a PCN reaction causing severe rash involving mucus membranes or skin necrosis: No  Has patient had a PCN reaction that required hospitalization: No  Has patient had a PCN reaction occurring within the last 10 years: Yes  If all of the above answers are "NO", then may proceed with Cephalosporin use.   Levofloxacin Other (See Comments)    Dizziness   Oxycodone    Vilazodone Other (See Comments)    Review of Systems  Constitutional:  Positive for malaise/fatigue and weight loss.  Musculoskeletal:  Positive for back pain, joint pain and myalgias.  All other systems reviewed and are negative.      Objective:   BP 118/64   Pulse 71   Ht 5\' 1"  (1.549 m)   Wt 232 lb (105.2 kg)   LMP 04/01/2018 (Exact Date)   SpO2 95%   BMI 43.84 kg/m   Vitals:   01/08/23 0918  BP: 118/64  Pulse: 71  Height: 5\' 1"  (1.549 m)  Weight: 232 lb (105.2 kg)  SpO2: 95%  BMI (Calculated): 43.86    Physical Exam Vitals and nursing note reviewed.  Constitutional:      Appearance: Normal appearance. She is obese.  HENT:     Head: Normocephalic.     Nose: Nose normal.  Eyes:     Pupils: Pupils are equal, round, and reactive to light.  Cardiovascular:     Rate and Rhythm: Normal rate.   Pulmonary:     Effort: Pulmonary effort is normal.  Musculoskeletal:     Cervical back: Normal range of motion.  Neurological:     General: No focal deficit present.     Mental Status: She is alert and oriented to person, place, and time. Mental status is at baseline.      No results found for any visits on 01/08/23.  Recent Results (from the past 2160 hour(s))  SARS CORONAVIRUS 2 (TAT 6-24 HRS) Anterior Nasal Swab     Status:  None   Collection Time: 12/12/22 10:37 AM   Specimen: Anterior Nasal Swab  Result Value Ref Range   SARS Coronavirus 2 NEGATIVE NEGATIVE    Comment: (NOTE) SARS-CoV-2 target nucleic acids are NOT DETECTED.  The SARS-CoV-2 RNA is generally detectable in upper and lower respiratory specimens during the acute phase of infection. Negative results do not preclude SARS-CoV-2 infection, do not rule out co-infections with other pathogens, and should not be used as the sole basis for treatment or other patient management decisions. Negative results must be combined with clinical observations, patient history, and epidemiological information. The expected result is Negative.  Fact Sheet for Patients: SugarRoll.be  Fact Sheet for Healthcare Providers: https://www.woods-mathews.com/  This test is not yet approved or cleared by the [place] FDA and  has been authorized for detection and/or diagnosis of SARS-CoV-2 by FDA under an Emergency Use Authorization (EUA). This EUA will remain  in effect (meaning this test can be used) for the duration of the COVID-19 declaration under Se ction 564(b)(1) of the Act, 21 U.S.C. section 360bbb-3(b)(1), unless the authorization is terminated or revoked sooner.  Performed at [facility], [place] [address], [place], [place] [address]       Assessment & Plan:   Problem List Items Addressed This Visit     Essential hypertension, benign   Relevant Orders   CBC With  Differential   CMP14+EGFR   Hyperlipidemia   Relevant Orders   Lipid panel   CBC With Differential   CMP14+EGFR   Type 2 diabetes mellitus with hyperglycemia, without long-term current use of insulin (HCC)   Relevant Medications   MOUNJARO 12.5 MG/0.5ML Pen   Other Relevant Orders   CBC With Differential   CMP14+EGFR   Hemoglobin A1c   Vitamin D deficiency, unspecified   Relevant Orders   VITAMIN D 25 Hydroxy (Vit-D Deficiency, Fractures)   CBC With Differential   CMP14+EGFR   Other Visit Diagnoses     B12 deficiency due to diet    -  Primary   Relevant Orders   CBC With Differential   CMP14+EGFR   Vitamin B12   Hypothyroidism (acquired)       Relevant Orders   CBC With Differential   CMP14+EGFR   TSH       Return in about 1 week (around 01/15/2023) for CPE. Please bring medications with you.   Total time spent: 30 minutes  Mechele Claude, FNP  01/08/2023

## 2023-01-09 LAB — CBC WITH DIFFERENTIAL
Basophils Absolute: 0.1 10*3/uL (ref 0.0–0.2)
Basos: 1 %
EOS (ABSOLUTE): 0.1 10*3/uL (ref 0.0–0.4)
Eos: 1 %
Hematocrit: 43.4 % (ref 34.0–46.6)
Hemoglobin: 14.9 g/dL (ref 11.1–15.9)
Immature Grans (Abs): 0 10*3/uL (ref 0.0–0.1)
Immature Granulocytes: 0 %
Lymphocytes Absolute: 1.7 10*3/uL (ref 0.7–3.1)
Lymphs: 25 %
MCH: 31.9 pg (ref 26.6–33.0)
MCHC: 34.3 g/dL (ref 31.5–35.7)
MCV: 93 fL (ref 79–97)
Monocytes Absolute: 0.6 10*3/uL (ref 0.1–0.9)
Monocytes: 9 %
Neutrophils Absolute: 4.4 10*3/uL (ref 1.4–7.0)
Neutrophils: 64 %
RBC: 4.67 x10E6/uL (ref 3.77–5.28)
RDW: 13.7 % (ref 11.7–15.4)
WBC: 6.8 10*3/uL (ref 3.4–10.8)

## 2023-01-09 LAB — CMP14+EGFR
ALT: 26 IU/L (ref 0–32)
AST: 22 IU/L (ref 0–40)
Albumin/Globulin Ratio: 1.7 (ref 1.2–2.2)
Albumin: 4.3 g/dL (ref 3.8–4.9)
Alkaline Phosphatase: 122 IU/L — ABNORMAL HIGH (ref 44–121)
BUN/Creatinine Ratio: 18 (ref 9–23)
BUN: 15 mg/dL (ref 6–24)
Bilirubin Total: 0.3 mg/dL (ref 0.0–1.2)
CO2: 25 mmol/L (ref 20–29)
Calcium: 9.8 mg/dL (ref 8.7–10.2)
Chloride: 101 mmol/L (ref 96–106)
Creatinine, Ser: 0.84 mg/dL (ref 0.57–1.00)
Globulin, Total: 2.6 g/dL (ref 1.5–4.5)
Glucose: 113 mg/dL — ABNORMAL HIGH (ref 70–99)
Potassium: 5.2 mmol/L (ref 3.5–5.2)
Sodium: 143 mmol/L (ref 134–144)
Total Protein: 6.9 g/dL (ref 6.0–8.5)
eGFR: 82 mL/min/{1.73_m2} (ref >59)

## 2023-01-09 LAB — LIPID PANEL
Chol/HDL Ratio: 2.2 ratio (ref 0.0–4.4)
Cholesterol, Total: 109 mg/dL (ref 100–199)
HDL: 50 mg/dL (ref >39)
LDL Chol Calc (NIH): 42 mg/dL (ref 0–99)
Triglycerides: 88 mg/dL (ref 0–149)
VLDL Cholesterol Cal: 17 mg/dL (ref 5–40)

## 2023-01-09 LAB — HEMOGLOBIN A1C
Est. average glucose Bld gHb Est-mCnc: 126 mg/dL
Hgb A1c MFr Bld: 6 % — ABNORMAL HIGH (ref 4.8–5.6)

## 2023-01-09 LAB — VITAMIN B12: Vitamin B-12: 527 pg/mL (ref 232–1245)

## 2023-01-09 LAB — TSH: TSH: 1.1 u[IU]/mL (ref 0.450–4.500)

## 2023-01-09 LAB — VITAMIN D 25 HYDROXY (VIT D DEFICIENCY, FRACTURES): Vit D, 25-Hydroxy: 66.3 ng/mL (ref 30.0–100.0)

## 2023-01-13 ENCOUNTER — Encounter: Payer: Self-pay | Admitting: Family

## 2023-01-13 MED ORDER — FEXOFENADINE HCL 180 MG PO TABS: 180.0000 mg | ORAL_TABLET | Freq: Every day | ORAL | 1 refills | Status: DC

## 2023-01-15 ENCOUNTER — Ambulatory Visit (INDEPENDENT_AMBULATORY_CARE_PROVIDER_SITE_OTHER): Payer: 59 | Admitting: Family

## 2023-01-15 ENCOUNTER — Encounter: Payer: Self-pay | Admitting: Family

## 2023-01-15 VITALS — BP 140/90 | HR 94 | Ht 61.0 in | Wt 232.6 lb

## 2023-01-15 DIAGNOSIS — Z01419 Encounter for gynecological examination (general) (routine) without abnormal findings: Secondary | ICD-10-CM

## 2023-01-15 DIAGNOSIS — Z0000 Encounter for general adult medical examination without abnormal findings: Secondary | ICD-10-CM | POA: Diagnosis not present

## 2023-01-15 MED ORDER — GABAPENTIN 600 MG PO TABS: 600.0000 mg | ORAL_TABLET | Freq: Three times a day (TID) | ORAL | 1 refills | Status: DC

## 2023-01-15 NOTE — Progress Notes (Signed)
Complete physical exam  Patient: Terri Wood   DOB: 1966-04-13   57 y.o. Female  MRN: [id number]  Subjective:    No chief complaint on file.   ELVENA JACKOVICH is a 57 y.o. female who presents today for a complete physical exam. She reports consuming a general diet. Gym/ health club routine includes cardio, low impact aerobics, stationary bike, swimming, treadmill, and weights. She generally feels fairly well. She reports sleeping fairly well. She does not have additional problems to discuss today.    Most recent fall risk assessment:    01/15/2023    1:59 PM  [place] in the past year? 0  Number falls in past yr: 0  Injury with Fall? 0  Risk for fall due to : Medication side effect  Follow up Follow up appointment     Most recent depression screenings:    01/15/2023    2:00 PM  PHQ 2/9 Scores  PHQ - 2 Score 0    Patient Care Team: Mechele Claude, FNP as PCP - General (Family Medicine)   Outpatient Medications Prior to Visit  Medication Sig   amLODipine (NORVASC) 10 MG tablet Take 5 mg by mouth at bedtime.   Ascorbic Acid (VITAMIN C WITH ROSE HIPS) 1000 MG tablet Take 1,000 mg by mouth daily.   azelastine (ASTELIN) 0.1 % nasal spray Place 2 sprays into both nostrils 2 (two) times daily. Use in each nostril as directed   buPROPion (WELLBUTRIN XL) 150 MG 24 hr tablet Take 150 mg by mouth daily.   celecoxib (CELEBREX) 200 MG capsule Take 200 mg by mouth daily.   Cholecalciferol (VITAMIN D3) 125 MCG (5000 UT) CAPS Take 5,000 Units by mouth daily.   cloNIDine (CATAPRES) 0.1 MG tablet Take 0.1 mg by mouth at bedtime.   DULoxetine (CYMBALTA) 60 MG capsule Take 60 mg by mouth 2 (two) times daily.   GEMTESA 75 MG TABS Take 1 tablet by mouth daily.   HYDROcodone-acetaminophen (NORCO) 10-325 MG tablet Take 1 tablet by mouth every 4 (four) hours as needed.   lisinopril-hydrochlorothiazide (ZESTORETIC) 20-25 MG tablet Take 1 tablet by mouth daily.   omeprazole  (PRILOSEC) 40 MG capsule Take 40 mg by mouth daily.   pregabalin (LYRICA) 200 MG capsule Take 200 mg by mouth 2 (two) times daily.   vortioxetine HBr (TRINTELLIX) 5 MG TABS tablet Take 5 mg by mouth daily.   ACCU-CHEK GUIDE test strip USE TO CHECK BLOOD GLUCOSE ONCE DAILY   aspirin 81 MG chewable tablet Chew 1 tablet (81 mg total) by mouth 2 (two) times daily.   atorvastatin (LIPITOR) 80 MG tablet Take 80 mg by mouth daily.   B Complex-C (B-COMPLEX WITH VITAMIN C) tablet Take 1 tablet by mouth daily.   Biotin 5 MG CAPS Take 5 mg by mouth daily.   clopidogrel (PLAVIX) 75 MG tablet Take 75 mg by mouth daily.   diclofenac Sodium (VOLTAREN) 1 % GEL Apply 1 application topically 3 (three) times daily as needed (pain).   fexofenadine (ALLEGRA) 180 MG tablet Take 1 tablet (180 mg total) by mouth daily.   MOUNJARO 12.5 MG/0.5ML Pen Inject 12.5 mg into the skin once a week.   Multiple Vitamins-Minerals (MULTI FOR HER 50+ PO) Take 1 tablet by mouth daily.   Omega 3 1000 MG CAPS Take 1,000 mg by mouth daily with breakfast.    pimecrolimus (ELIDEL) 1 % cream Apply 1 application. topically in the morning and at bedtime.  polyethylene glycol (MIRALAX / GLYCOLAX) 17 g packet Take 17 g by mouth daily as needed for mild constipation.   Specialty Vitamins Products (MENOPAUSE RELIEF PO) Take 2 tablets by mouth daily. Amberen   [DISCONTINUED] baclofen (LIORESAL) 10 MG tablet Take 5 mg by mouth at bedtime as needed for muscle spasms.   [DISCONTINUED] Calcium Carbonate-Vitamin D (CALTRATE 600+D PO) Take 1 tablet by mouth daily.   [DISCONTINUED] docusate sodium (COLACE) 100 MG capsule Take 1 capsule (100 mg total) by mouth 2 (two) times daily.   [DISCONTINUED] methocarbamol (ROBAXIN) 500 MG tablet Take 1 tablet (500 mg total) by mouth every 6 (six) hours as needed for muscle spasms.   [DISCONTINUED] Multiple Minerals-Vitamins (CAL MAG ZINC +D3) TABS Take 1 tablet by mouth daily.   [DISCONTINUED] ondansetron  (ZOFRAN-ODT) 4 MG disintegrating tablet Take 1 tablet (4 mg total) by mouth every 8 (eight) hours as needed for nausea or vomiting.   [DISCONTINUED] traMADol (ULTRAM) 50 MG tablet Take 1 tablet (50 mg total) by mouth every 6 (six) hours.   No facility-administered medications prior to visit.    Review of Systems  All other systems reviewed and are negative.     Objective:     BP (!) 140/90   Pulse 94   Ht 5\' 1"  (1.549 m)   Wt 232 lb 9.6 oz (105.5 kg)   LMP 04/01/2018 (Exact Date)   SpO2 96%   BMI 43.95 kg/m  BP Readings from Last 3 Encounters:  01/15/23 (!) 140/90  01/08/23 118/64  12/12/22 119/76   Wt Readings from Last 3 Encounters:  01/15/23 232 lb 9.6 oz (105.5 kg)  01/08/23 232 lb (105.2 kg)  12/04/22 239 lb 9.6 oz (108.7 kg)    Physical Exam Vitals and nursing note reviewed. Exam conducted with a chaperone present.  Constitutional:      Appearance: Normal appearance. She is normal weight.  HENT:     Head: Normocephalic and atraumatic.  Eyes:     Extraocular Movements: Extraocular movements intact.     Pupils: Pupils are equal, round, and reactive to light.  Cardiovascular:     Rate and Rhythm: Normal rate and regular rhythm.     Pulses: Normal pulses.     Heart sounds: Normal heart sounds.  Pulmonary:     Effort: Pulmonary effort is normal.     Breath sounds: Normal breath sounds.  Genitourinary:    General: Normal vulva.     Exam position: Lithotomy position.     Pubic Area: No rash.   Musculoskeletal:        General: Normal range of motion.  Neurological:     General: No focal deficit present.     Mental Status: She is alert.      No results found for any visits on 01/15/23. Last CBC Lab Results  Component Value Date   WBC 6.8 01/08/2023   HGB 14.9 01/08/2023   HCT 43.4 01/08/2023   MCV 93 01/08/2023   MCH 31.9 01/08/2023   RDW 13.7 01/08/2023   PLT 284 [id number]   Last metabolic panel Lab Results  Component Value Date   GLUCOSE 113  (H) 01/08/2023   NA 143 01/08/2023   K 5.2 01/08/2023   CL 101 01/08/2023   CO2 25 01/08/2023   BUN 15 01/08/2023   CREATININE 0.84 01/08/2023   EGFR 82 01/08/2023   CALCIUM 9.8 01/08/2023   PHOS 4.1 08/20/2019   PROT 6.9 01/08/2023   ALBUMIN 4.3 01/08/2023   LABGLOB  2.6 01/08/2023   AGRATIO 1.7 01/08/2023   BILITOT 0.3 01/08/2023   ALKPHOS 122 (H) 01/08/2023   AST 22 01/08/2023   ALT 26 01/08/2023   ANIONGAP 6 02/17/2022   Last lipids Lab Results  Component Value Date   CHOL 109 01/08/2023   HDL 50 01/08/2023   LDLCALC 42 01/08/2023   TRIG 88 01/08/2023   CHOLHDL 2.2 01/08/2023   Last hemoglobin A1c Lab Results  Component Value Date   HGBA1C 6.0 (H) 01/08/2023   Last thyroid functions Lab Results  Component Value Date   TSH 1.100 01/08/2023   Last vitamin D Lab Results  Component Value Date   VD25OH 66.3 01/08/2023   Last vitamin B12 and Folate Lab Results  Component Value Date   [id number] 01/08/2023        Assessment & Plan:    Routine Health Maintenance and Physical Exam  Immunization History  Administered Date(s) Administered   Influenza Split 08/06/2015   Influenza-Unspecified 09/13/2017   Tdap 04/28/2015    Health Maintenance  Topic Date Due   COVID-19 Vaccine (1) Never done   FOOT EXAM  Never done   OPHTHALMOLOGY EXAM  Never done   Diabetic kidney evaluation - Urine ACR  Never done   Hepatitis C Screening  Never done   PAP SMEAR-Modifier  Never done   COLONOSCOPY (Pts 45-43yrs Insurance coverage will need to be confirmed)  Never done   MAMMOGRAM  Never done   Zoster Vaccines- Shingrix (1 of 2) Never done   Lung Cancer Screening  06/05/2022   INFLUENZA VACCINE  05/17/2023   Medicare Annual Wellness (AWV)  07/08/2023   HEMOGLOBIN A1C  07/11/2023   Diabetic kidney evaluation - eGFR measurement  01/08/2024   DTaP/Tdap/Td (2 - Td or Tdap) 04/27/2025   HIV Screening  Completed   HPV VACCINES  Aged Out    Discussed health  benefits of physical activity, and encouraged her to engage in regular exercise appropriate for her age and condition.  Problem List Items Addressed This Visit   None Visit Diagnoses     Encounter for annual physical exam    -  Primary   Relevant Orders   IGP, Aptima HPV, rfx 16/18,45   Well woman exam with routine gynecological exam          Return in about 1 month (around 02/14/2023) for F/U.     Mechele Claude, FNP  01/15/2023

## 2023-01-15 NOTE — Addendum Note (Signed)
Addended by: Georgian Co on: 01/15/2023 04:50 PM   Modules accepted: Orders

## 2023-01-17 LAB — IGP, APTIMA HPV, RFX 16/18,45
HPV Aptima: NEGATIVE
PAP Smear Comment: 0

## 2023-01-17 LAB — SPECIMEN STATUS REPORT

## 2023-01-24 ENCOUNTER — Telehealth: Payer: Self-pay | Admitting: Family

## 2023-01-24 NOTE — Telephone Encounter (Signed)
Dizziness and lightheaded. Started a couple of days ago. BP was low at the dentist the other day. Current BP is 97/54. Does she need to hold one of her BP medications for now or what should she do?

## 2023-01-26 ENCOUNTER — Other Ambulatory Visit: Payer: Self-pay

## 2023-01-26 ENCOUNTER — Telehealth: Payer: Self-pay

## 2023-01-26 NOTE — Telephone Encounter (Signed)
Pt called with BP reading today: 124/81 pulse 76. Pt informed to hold BP rx's over the weekend and keep track of bp readings over the weekend and will update me on Monday.   She also asked if you can send upped dose of rx mounjaro for her? Please advise

## 2023-01-29 ENCOUNTER — Telehealth: Payer: Self-pay

## 2023-01-29 NOTE — Telephone Encounter (Signed)
Pt called and left vm regarding BP readings over the weekend, Saturday AM: 120/75, HR 76. Sunday AM: 109/71, HR 85. Sunday PM: 125/71 HR 82. This morning 128/78, HR 84. Please advise

## 2023-01-31 ENCOUNTER — Encounter: Payer: Self-pay | Admitting: Dietician

## 2023-01-31 ENCOUNTER — Encounter: Payer: 59 | Attending: Plastic Surgery | Admitting: Dietician

## 2023-01-31 DIAGNOSIS — Z713 Dietary counseling and surveillance: Secondary | ICD-10-CM | POA: Insufficient documentation

## 2023-01-31 DIAGNOSIS — I10 Essential (primary) hypertension: Secondary | ICD-10-CM | POA: Diagnosis not present

## 2023-01-31 DIAGNOSIS — R7303 Prediabetes: Secondary | ICD-10-CM | POA: Diagnosis not present

## 2023-01-31 DIAGNOSIS — E785 Hyperlipidemia, unspecified: Secondary | ICD-10-CM | POA: Insufficient documentation

## 2023-01-31 NOTE — Patient Instructions (Signed)
Goals Established by Pt Goal 1: aim to include a non-starchy vegetable with lunch and dinner.  Goal 2: continue your water aerobics but add a 20-30 minute walk 3-4 days per week.   Other recommendations: At snacks aim to include a complex carb and protein. (Nuts and fruits, fruit and cheese, cheese and crackers, peanut butter and crackers, egg on toast, peanut butter on toast) Aim for 4 water bottles daily at least. Aim for 150 minutes of physical activity weekly.

## 2023-01-31 NOTE — Progress Notes (Signed)
Medical Nutrition Therapy  Appointment Start time:  1540  Appointment End time:  1630  Primary concerns today: Pt states she wants to learn how to eat healthy.    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  Medications: mounjaro Labs: 01/08/23 A1c 6.0% Notable Signs/Symptoms: none reported  Food Allergies: none   Lifestyle & Dietary Hx  Pt states she was 260 lbs several months ago and is now 232 lbs. Pt reports she has been intentionally trying to lose weight.   Pt states she started mounjaro a few months ago and felt like this reduced her appetite. She states she doesn't eat as much at meal times. Pt states if she is not hungry she doesn't eat and sometimes waits until lunch to eat.   Pt goes to water aerobics at the [facility] 2-3x/wk. Pt states she also does a lot of yard work. Pt reports she enjoys walking.  Pt states she has had 2 strokes in the past and the only thing it continues to effect is her left hand.   Pt states she tries to remain positive but has been feeling down about the body she is in.    Estimated daily fluid intake: 64 oz Supplements: B complex, vitamin C, MVI, omega 3, biotin, vitamin D3 Sleep: 6 hours Stress / self-care: moderate stress; pt states she enjoys church.  Current average weekly physical activity: water aerobics 45 minutes 2-3x/wk.   24-Hr Dietary Recall First Meal: 3 boiled eggs OR peanut butter toast Snack: none Second Meal: cheeseburger OR sandwich  Snack: deli meat and cheese OR none Third Meal: meat, starch and vegetable Snack: none OR pears with butter and brown sugar OR nuts Beverages: 1 diet coke, half sweet half un-sweet tea, water (4 bottles)   NUTRITION DIAGNOSIS  [place]-2.2 Altered  nutrition-related laboratory As related to prediabetes.  As evidenced by A1c 6.0%.   NUTRITION INTERVENTION  Nutrition education (E-1) on the following topics:  Building balanced meals and snacks MyPlate Benefits of physical activity on blood glucose and stress Strategies to make more balanced meals Importance of consistent meal times Insulin resistance and prediabetes Importance of stress management on health Impact of protein and fiber on hunger/fullness Starchy vs non-starchy vegetables  Handouts Provided Include  MyPlate Meal Ideas  Learning Style & Readiness for Change Teaching method utilized: Visual & Auditory  Demonstrated degree of understanding via: Teach Back  Barriers to learning/adherence to lifestyle change: none  Goals Established by Pt Goal 1: aim to include a non-starchy vegetable with lunch and dinner.  Goal 2: continue your water aerobics but add a 20-30 minute walk 3-4 days per week.   Other recommendations: At snacks aim to include a complex carb and protein. (Nuts and fruits, fruit and cheese, cheese and crackers, peanut butter and crackers, egg on toast, peanut butter on toast) Aim for 4 water bottles daily at least. Aim for 150 minutes of physical activity weekly.    MONITORING & EVALUATION Dietary intake, weekly physical activity, and follow up in 3 months.  Next Steps  Patient is to call for questions.

## 2023-02-07 NOTE — Telephone Encounter (Signed)
Blood pressure medications stopped.

## 2023-02-13 ENCOUNTER — Other Ambulatory Visit (HOSPITAL_COMMUNITY): Payer: Self-pay

## 2023-02-14 ENCOUNTER — Other Ambulatory Visit: Payer: Self-pay | Admitting: Family

## 2023-02-14 ENCOUNTER — Encounter: Payer: Self-pay | Admitting: Family

## 2023-02-14 ENCOUNTER — Ambulatory Visit (INDEPENDENT_AMBULATORY_CARE_PROVIDER_SITE_OTHER): Payer: 59 | Admitting: Family

## 2023-02-14 ENCOUNTER — Other Ambulatory Visit: Payer: Self-pay

## 2023-02-14 VITALS — BP 136/84 | HR 86 | Ht 61.0 in | Wt 238.2 lb

## 2023-02-14 DIAGNOSIS — E559 Vitamin D deficiency, unspecified: Secondary | ICD-10-CM

## 2023-02-14 DIAGNOSIS — E538 Deficiency of other specified B group vitamins: Secondary | ICD-10-CM

## 2023-02-14 DIAGNOSIS — I10 Essential (primary) hypertension: Secondary | ICD-10-CM | POA: Diagnosis not present

## 2023-02-14 DIAGNOSIS — E1165 Type 2 diabetes mellitus with hyperglycemia: Secondary | ICD-10-CM

## 2023-02-14 DIAGNOSIS — E782 Mixed hyperlipidemia: Secondary | ICD-10-CM | POA: Diagnosis not present

## 2023-02-14 LAB — POC CREATINE & ALBUMIN,URINE
Albumin/Creatinine Ratio, Urine, POC: 10
Creatinine, POC: 50 mg/dL
Microalbumin Ur, POC: <30 mg/L

## 2023-02-14 MED ORDER — MOUNJARO 15 MG/0.5ML ~~LOC~~ SOAJ
15.0000 mg | SUBCUTANEOUS | 3 refills | Status: DC
  Filled 2023-02-14: qty 2, 28d supply, fill #0
  Filled 2023-03-08: qty 2, 28d supply, fill #1
  Filled 2023-04-16: qty 2, 28d supply, fill #2
  Filled 2023-05-09: qty 2, 28d supply, fill #3

## 2023-02-18 ENCOUNTER — Encounter: Payer: Self-pay | Admitting: Family

## 2023-02-18 NOTE — Progress Notes (Signed)
Established Patient Office Visit  Subjective:  Patient ID: Terri Wood, female    DOB: 1966/01/09  Age: 57 y.o. MRN: [id number]  Chief Complaint  Patient presents with   Follow-up    1 month follow up   COPD    1 month follow up.   Patient here for her 1 month follow up.  She is doing well with her change to gabapentin, not having any further issues with this.   She also says that she doesn't notice as much difference with the mounjaro as previously. Asks if we can increase.     No other concerns at this time.   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   Community acquired pneumonia 01/16/2021   Depression    Fibromyalgia    Foraminal stenosis of lumbar region 02/27/2018   GERD (gastroesophageal reflux disease)    Hyperkalemia 09/13/2017   Hyperlipidemia    Hypertension    Lumbar radiculopathy 12/15/2016   Multifocal pneumonia 06/05/2021   Osteoarthritis of left hip 2019   Pneumonia    01/16/2021 and 06/05/2021   Pre-diabetes    Restless leg syndrome 02/02/2015   Sepsis (HCC) 01/16/2021   Stroke (HCC) 08/05/2019   occlusion of right middle cerebral artery    Past Surgical History:  Procedure Laterality Date   ABDOMINAL SURGERY  1996   gastric bypass; stapling; surgilite   GASTRIC BYPASS OPEN  1996   PLANTAR FASCIA SURGERY Right 2009   TARSAL TUNNEL RELEASE  2009   TOTAL HIP ARTHROPLASTY Left 04/09/2018   Procedure: TOTAL HIP ARTHROPLASTY ANTERIOR APPROACH;  Surgeon: Kennedy Bucker, MD;  Location: [facility];  Service: Orthopedics;  Laterality: Left;   TOTAL KNEE ARTHROPLASTY Right 07/12/2021    Procedure: TOTAL KNEE ARTHROPLASTY;  Surgeon: Kennedy Bucker, MD;  Location: [facility];  Service: Orthopedics;  Laterality: Right;   TOTAL KNEE ARTHROPLASTY Left 02/16/2022   Procedure: TOTAL KNEE ARTHROPLASTY;  Surgeon: Kennedy Bucker, MD;  Location: [facility];  Service: Orthopedics;  Laterality: Left;    Social History   Socioeconomic History   Marital status: Legally Separated    Spouse name: Not on file   Number of children: 0   Years of education: Not on file   Highest education level: Not on file  Occupational History   Not on file  Tobacco Use   Smoking status: Former    Packs/day: 1    Types: Cigarettes    Quit date: 07/2019    Years since quitting: 3.5   Smokeless tobacco: Never  Vaping Use   Vaping Use: Never used  Substance and Sexual Activity   Alcohol use: Not Currently   Drug use: Not Currently    Types: Marijuana    Comment: none since 2019   Sexual activity: Not Currently  Other Topics Concern   Not on file  Social History Narrative   Lives with mother   Social Determinants of Health   Financial Resource Strain: Not on file  Food Insecurity: Not on file  Transportation Needs: Not on file  Physical Activity: Not on file  Stress: Not on file  Social Connections: Not  on file  Intimate Partner Violence: Not on file    Family History  Problem Relation Age of Onset   Aneurysm Mother    Heart disease Father     Allergies  Allergen Reactions   Penicillins Hives, Other (See Comments) and Rash    TOLERATED ROCEPHIN AND CEFAZOLIN  Did it involve swelling of the face/tongue/throat, SOB, or low BP? Yes  Did it involve sudden or severe rash/hives, skin peeling, or any reaction on the inside of your mouth or nose? No  Did you need to seek medical attention at a hospital or doctor's office? No  When did it last happen? Within the past 10 years   If all above answers are "NO", may proceed with cephalosporin use.  Did it involve swelling of the  face/tongue/throat, SOB, or low BP? Yes  Did it involve sudden or severe rash/hives, skin peeling, or any reaction on the inside of your mouth or nose? No  Did you need to seek medical attention at a hospital or doctor's office? No  When did it last happen? Within the past 10 years   If all above answers are "NO", may proceed with cephalosporin use.  Has patient had a PCN reaction causing immediate rash, facial/tongue/throat swelling, SOB or lightheadedness with hypotension: Yes  Has patient had a PCN reaction causing severe rash involving mucus membranes or skin necrosis: No  Has patient had a PCN reaction that required hospitalization: No  Has patient had a PCN reaction occurring within the last 10 years: Yes  If all of the above answers are "NO", then may proceed with Cephalosporin use.   Levofloxacin Other (See Comments)    Dizziness   Oxycodone    Vilazodone Other (See Comments)    Review of Systems  All other systems reviewed and are negative.      Objective:   BP 136/84   Pulse 86   Ht 5\' 1"  (1.549 m)   Wt 238 lb 3.2 oz (108 kg)   LMP 04/01/2018 (Exact Date)   SpO2 93%   BMI 45.01 kg/m   Vitals:   02/14/23 1056  BP: 136/84  Pulse: 86  Height: 5\' 1"  (1.549 m)  Weight: 238 lb 3.2 oz (108 kg)  SpO2: 93%  BMI (Calculated): 45.03    Physical Exam Vitals and nursing note reviewed.  Constitutional:      Appearance: Normal appearance. She is obese.  HENT:     Head: Normocephalic.  Eyes:     Pupils: Pupils are equal, round, and reactive to light.  Cardiovascular:     Rate and Rhythm: Normal rate.  Pulmonary:     Effort: Pulmonary effort is normal.  Neurological:     General: No focal deficit present.     Mental Status: She is alert and oriented to person, place, and time.  Psychiatric:        Mood and Affect: Mood normal.        Behavior: Behavior normal.        Thought Content: Thought content normal.        Judgment: Judgment normal.      Results for  orders placed or performed in visit on 02/14/23  POC CREATINE & ALBUMIN,URINE  Result Value Ref Range   Microalbumin Ur, POC <30 mg/L   Creatinine, POC 50 mg/dL   Albumin/Creatinine Ratio, Urine, POC 10     Recent Results (from the past 2160 hour(s))  SARS CORONAVIRUS 2 (TAT 6-24 HRS) Anterior Nasal Swab  Status: None   Collection Time: 12/12/22 10:37 AM   Specimen: Anterior Nasal Swab  Result Value Ref Range   SARS Coronavirus 2 NEGATIVE NEGATIVE    Comment: (NOTE) SARS-CoV-2 target nucleic acids are NOT DETECTED.  The SARS-CoV-2 RNA is generally detectable in upper and lower respiratory specimens during the acute phase of infection. Negative results do not preclude SARS-CoV-2 infection, do not rule out co-infections with other pathogens, and should not be used as the sole basis for treatment or other patient management decisions. Negative results must be combined with clinical observations, patient history, and epidemiological information. The expected result is Negative.  Fact Sheet for Patients: HairSlick.no  Fact Sheet for Healthcare Providers: quierodirigir.com  This test is not yet approved or cleared by the [place] FDA and  has been authorized for detection and/or diagnosis of SARS-CoV-2 by FDA under an Emergency Use Authorization (EUA). This EUA will remain  in effect (meaning this test can be used) for the duration of the COVID-19 declaration under Se ction 564(b)(1) of the Act, 21 U.S.C. section 360bbb-3(b)(1), unless the authorization is terminated or revoked sooner.  Performed at [facility], [address], [place], [place] [address]   Lipid panel     Status: None   Collection Time: 01/08/23  9:49 AM  Result Value Ref Range   Cholesterol, Total 109 100 - 199 mg/dL   Triglycerides 88 0 - 149 mg/dL   HDL 50 >19 mg/dL   VLDL Cholesterol Cal 17 5 - 40 mg/dL   LDL Chol Calc (NIH) 42 0 - 99  mg/dL   Chol/HDL Ratio 2.2 0.0 - 4.4 ratio    Comment:                                   T. Chol/HDL Ratio                                             Men  Women                               1/2 Avg.Risk  3.4    3.3                                   Avg.Risk  5.0    4.4                                2X Avg.Risk  9.6    7.1                                3X Avg.Risk 23.4   11.0   VITAMIN D 25 Hydroxy (Vit-D Deficiency, Fractures)     Status: None   Collection Time: 01/08/23  9:49 AM  Result Value Ref Range   Vit D, 25-Hydroxy 66.3 30.0 - 100.0 ng/mL    Comment: Vitamin D deficiency has been defined by the Institute of Medicine and an Endocrine Society practice guideline as a level of serum 25-OH vitamin D less than 20 ng/mL (1,2). The Endocrine  Society went on to further define vitamin D insufficiency as a level between 21 and 29 ng/mL (2). 1. IOM (Institute of Medicine). 2010. Dietary reference    intakes for calcium and D. [place]: The    [employer]. 2. Holick MF, Binkley Bessemer, Bischoff-Ferrari HA, et al.    Evaluation, treatment, and prevention of vitamin D    deficiency: an Endocrine Society clinical practice    guideline. JCEM. 2011 Jul; 96(7):1911-30.   CBC With Differential     Status: None   Collection Time: 01/08/23  9:49 AM  Result Value Ref Range   WBC 6.8 3.4 - 10.8 x10E3/uL   RBC 4.67 3.77 - 5.28 x10E6/uL   Hemoglobin 14.9 11.1 - 15.9 g/dL   Hematocrit 86.5 78.4 - 46.6 %   MCV 93 79 - 97 fL   MCH 31.9 26.6 - 33.0 pg   MCHC 34.3 31.5 - 35.7 g/dL   RDW 69.6 29.5 - 28.4 %   Neutrophils 64 Not Estab. %   Lymphs 25 Not Estab. %   Monocytes 9 Not Estab. %   Eos 1 Not Estab. %   Basos 1 Not Estab. %   Neutrophils Absolute 4.4 1.4 - 7.0 x10E3/uL   Lymphocytes Absolute 1.7 0.7 - 3.1 x10E3/uL   Monocytes Absolute 0.6 0.1 - 0.9 x10E3/uL   EOS (ABSOLUTE) 0.1 0.0 - 0.4 x10E3/uL   Basophils Absolute 0.1 0.0 - 0.2 x10E3/uL   Immature Granulocytes 0 Not  Estab. %   Immature Grans (Abs) 0.0 0.0 - 0.1 x10E3/uL  CMP14+EGFR     Status: Abnormal   Collection Time: 01/08/23  9:49 AM  Result Value Ref Range   Glucose 113 (H) 70 - 99 mg/dL   BUN 15 6 - 24 mg/dL   Creatinine, Ser 1.32 0.57 - 1.00 mg/dL   eGFR 82 >44 WN/UUV/2.53   BUN/Creatinine Ratio 18 9 - 23   Sodium 143 134 - 144 mmol/L   Potassium 5.2 3.5 - 5.2 mmol/L   Chloride 101 96 - 106 mmol/L   CO2 25 20 - 29 mmol/L   Calcium 9.8 8.7 - 10.2 mg/dL   Total Protein 6.9 6.0 - 8.5 g/dL   Albumin 4.3 3.8 - 4.9 g/dL   Globulin, Total 2.6 1.5 - 4.5 g/dL   Albumin/Globulin Ratio 1.7 1.2 - 2.2   Bilirubin Total 0.3 0.0 - 1.2 mg/dL   Alkaline Phosphatase 122 (H) 44 - 121 IU/L   AST 22 0 - 40 IU/L   ALT 26 0 - 32 IU/L  TSH     Status: None   Collection Time: 01/08/23  9:49 AM  Result Value Ref Range   TSH 1.100 0.450 - 4.500 uIU/mL  Hemoglobin A1c     Status: Abnormal   Collection Time: 01/08/23  9:49 AM  Result Value Ref Range   Hgb A1c MFr Bld 6.0 (H) 4.8 - 5.6 %    Comment:          Prediabetes: 5.7 - 6.4          Diabetes: >6.4          Glycemic control for adults with diabetes: <7.0    Est. average glucose Bld gHb Est-mCnc 126 mg/dL  Vitamin G64     Status: None   Collection Time: 01/08/23  9:49 AM  Result Value Ref Range   Vitamin B-12 527 232 - 1,245 pg/mL  IGP, Aptima HPV, rfx 16/18,45     Status: None   Collection  Time: 01/15/23 12:00 AM  Result Value Ref Range   DIAGNOSIS: Comment     Comment: NEGATIVE FOR INTRAEPITHELIAL LESION OR MALIGNANCY.   Specimen adequacy: Comment     Comment: Satisfactory for evaluation. No endocervical component is identified.   Clinician Provided ICD10 Comment     Comment: Z00.00   Performed by: Comment     Comment: Daphene Jaeger, Cytotechnologist (ASCP)   PAP Smear Comment .    Note: Comment     Comment: The Pap smear is a screening test designed to aid in the detection of premalignant and malignant conditions of the uterine cervix.   It is not a diagnostic procedure and should not be used as the sole means of detecting cervical cancer.  Both false-positive and false-negative reports do occur.    Test Methodology Comment     Comment: This liquid based ThinPrep(R) pap test was screened with the use of an image guided system.    HPV Aptima Negative Negative    Comment: This nucleic acid amplification test detects fourteen high-risk HPV types (16,18,31,33,35,39,45,51,52,56,58,59,66,68) without differentiation.    HPV Genotype Reflex Comment     Comment: Criteria not met, HPV Genotype not performed.  Specimen status report     Status: None   Collection Time: 01/15/23 12:00 AM  Result Value Ref Range   specimen status report Comment     Comment: Please note Please note The date and/or time of collection was not indicated on the requisition as required by state and federal law.  The date of receipt of the specimen was used as the collection date if not supplied.   POC CREATINE & ALBUMIN,URINE     Status: Normal   Collection Time: 02/14/23 12:02 PM  Result Value Ref Range   Microalbumin Ur, POC <30 mg/L   Creatinine, POC 50 mg/dL   Albumin/Creatinine Ratio, Urine, POC 10        Assessment & Plan:   Problem List Items Addressed This Visit       Cardiovascular and Mediastinum   Essential hypertension, benign     Endocrine   Type 2 diabetes mellitus with hyperglycemia, without long-term current use of insulin (HCC) - Primary   Relevant Medications   tirzepatide (MOUNJARO) 15 MG/0.5ML Pen   Other Relevant Orders   POC CREATINE & ALBUMIN,URINE (Completed)     Other   Hyperlipidemia   Vitamin D deficiency, unspecified   Other Visit Diagnoses     B12 deficiency due to diet           Return in about 2 months (around 04/16/2023) for F/U.   Total time spent: 30 minutes  Miki Kins, FNP  02/14/2023

## 2023-02-19 ENCOUNTER — Telehealth: Payer: Self-pay

## 2023-02-19 NOTE — Telephone Encounter (Signed)
Pt called regarding issues with palpitations/anxiety, said it's been going on over the past few days & has gotten worse. She said she hasn't seen cardiology here (was going to one at [facility] but didn't like them) took BP this morning and it was 158/90, pulse 82. Took her bp meds and said it helped, but did you want her to see you or cardiology regarding this? Please advise

## 2023-02-23 ENCOUNTER — Ambulatory Visit (INDEPENDENT_AMBULATORY_CARE_PROVIDER_SITE_OTHER): Payer: 59 | Admitting: Cardiovascular Disease

## 2023-02-23 ENCOUNTER — Encounter: Payer: Self-pay | Admitting: Cardiovascular Disease

## 2023-02-23 ENCOUNTER — Institutional Professional Consult (permissible substitution): Payer: 59 | Admitting: Cardiovascular Disease

## 2023-02-23 VITALS — BP 126/86 | HR 81 | Ht 61.0 in | Wt 227.8 lb

## 2023-02-23 DIAGNOSIS — E782 Mixed hyperlipidemia: Secondary | ICD-10-CM

## 2023-02-23 DIAGNOSIS — I6523 Occlusion and stenosis of bilateral carotid arteries: Secondary | ICD-10-CM | POA: Diagnosis not present

## 2023-02-23 DIAGNOSIS — I10 Essential (primary) hypertension: Secondary | ICD-10-CM | POA: Diagnosis not present

## 2023-02-23 DIAGNOSIS — R0789 Other chest pain: Secondary | ICD-10-CM | POA: Insufficient documentation

## 2023-02-23 DIAGNOSIS — F17200 Nicotine dependence, unspecified, uncomplicated: Secondary | ICD-10-CM

## 2023-02-23 DIAGNOSIS — R002 Palpitations: Secondary | ICD-10-CM | POA: Insufficient documentation

## 2023-02-23 MED ORDER — METOPROLOL SUCCINATE ER 25 MG PO TB24: 25.0000 mg | ORAL_TABLET | Freq: Every day | ORAL | 2 refills | Status: DC

## 2023-02-23 MED ORDER — LISINOPRIL 10 MG PO TABS: 10.0000 mg | ORAL_TABLET | Freq: Every day | ORAL | 11 refills | Status: DC

## 2023-02-23 NOTE — Progress Notes (Signed)
Cardiology Office Note   Date:  02/23/2023   ID:  LORIEL MARRA, DOB 1965-12-05, MRN [id number]  PCP:  Miki Kins, FNP  Cardiologist:  Adrian Blackwater, MD      History of Present Illness: Terri Wood is a 57 y.o. female who presents for  Chief Complaint  Patient presents with   Establish Care    Cardiac consult    Patient in office to establish care. History of CVA. Complains of palpitations. Occasional chest pain. Shortness of breath with heavy exertion, fatigue.    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   Community acquired pneumonia 01/16/2021   Depression    Fibromyalgia    Foraminal stenosis of lumbar region 02/27/2018   GERD (gastroesophageal reflux disease)    Hyperkalemia 09/13/2017   Hyperlipidemia    Hypertension    Lumbar radiculopathy 12/15/2016   Multifocal pneumonia 06/05/2021   Osteoarthritis of left hip 2019   Pneumonia    01/16/2021 and 06/05/2021   Pre-diabetes    Restless leg syndrome 02/02/2015   Sepsis (HCC) 01/16/2021   Stroke (HCC) 08/05/2019   occlusion of right middle cerebral artery     Past Surgical History:  Procedure Laterality Date   ABDOMINAL SURGERY  1996   gastric bypass; stapling; surgilite   GASTRIC BYPASS OPEN  1996   PLANTAR FASCIA SURGERY Right 2009   TARSAL TUNNEL RELEASE  2009   TOTAL HIP ARTHROPLASTY Left 04/09/2018   Procedure: TOTAL HIP ARTHROPLASTY ANTERIOR APPROACH;  Surgeon: Kennedy Bucker, MD;  Location: [facility];  Service: Orthopedics;  Laterality: Left;   TOTAL KNEE ARTHROPLASTY Right 07/12/2021   Procedure: TOTAL KNEE ARTHROPLASTY;   Surgeon: Kennedy Bucker, MD;  Location: [facility];  Service: Orthopedics;  Laterality: Right;   TOTAL KNEE ARTHROPLASTY Left 02/16/2022   Procedure: TOTAL KNEE ARTHROPLASTY;  Surgeon: Kennedy Bucker, MD;  Location: [facility];  Service: Orthopedics;  Laterality: Left;     Current Outpatient Medications  Medication Sig Dispense Refill   ACCU-CHEK GUIDE test strip USE TO CHECK BLOOD GLUCOSE ONCE DAILY 100 strip 1   Ascorbic Acid (VITAMIN C WITH ROSE HIPS) 1000 MG tablet Take 1,000 mg by mouth daily.     aspirin 81 MG chewable tablet Chew 1 tablet (81 mg total) by mouth 2 (two) times daily. 30 tablet 0   atorvastatin (LIPITOR) 80 MG tablet Take 80 mg by mouth daily.     azelastine (ASTELIN) 0.1 % nasal spray Place 2 sprays into both nostrils 2 (two) times daily. Use in each nostril as directed 30 mL 12   B Complex-C (B-COMPLEX WITH VITAMIN C) tablet Take 1 tablet by mouth daily.     Biotin 5 MG CAPS Take 5 mg by mouth daily.     buPROPion (WELLBUTRIN XL) 150 MG 24 hr tablet Take 150 mg by mouth daily.     celecoxib (CELEBREX) 200 MG capsule TAKE 1 CAPSULE BY MOUTH TWICE A DAY 60 capsule 5   Cholecalciferol (VITAMIN D3) 125 MCG (5000 UT) CAPS Take 5,000 Units by mouth daily.     cloNIDine (CATAPRES) 0.1 MG tablet Take 0.1  mg by mouth at bedtime.     clopidogrel (PLAVIX) 75 MG tablet Take 75 mg by mouth daily.     diclofenac Sodium (VOLTAREN) 1 % GEL Apply 1 application topically 3 (three) times daily as needed (pain).     DULoxetine (CYMBALTA) 60 MG capsule Take 60 mg by mouth 2 (two) times daily.     fexofenadine (ALLEGRA) 180 MG tablet Take 1 tablet (180 mg total) by mouth daily. 90 tablet 1   gabapentin (NEURONTIN) 600 MG tablet Take 1 tablet (600 mg total) by mouth 3 (three) times daily. 90 tablet 1   GEMTESA 75 MG TABS Take 1 tablet by mouth daily.     HYDROcodone-acetaminophen (NORCO) 10-325 MG tablet Take 1 tablet by mouth every 4 (four) hours as needed.     lisinopril (ZESTRIL) 10 MG tablet  Take 1 tablet (10 mg total) by mouth daily. 30 tablet 11   metoprolol succinate (TOPROL XL) 25 MG 24 hr tablet Take 1 tablet (25 mg total) by mouth daily. 30 tablet 2   Multiple Vitamins-Minerals (MULTI FOR HER 50+ PO) Take 1 tablet by mouth daily.     Omega 3 1000 MG CAPS Take 1,000 mg by mouth daily with breakfast.      omeprazole (PRILOSEC) 40 MG capsule Take 40 mg by mouth daily.     pimecrolimus (ELIDEL) 1 % cream Apply 1 application. topically in the morning and at bedtime.     polyethylene glycol (MIRALAX / GLYCOLAX) 17 g packet Take 17 g by mouth daily as needed for mild constipation. 14 each 0   Specialty Vitamins Products (MENOPAUSE RELIEF PO) Take 2 tablets by mouth daily. Amberen     tirzepatide [facility]) 15 MG/0.5ML Pen Inject 15 mg into the skin once a week. 2 mL 3   vortioxetine HBr (TRINTELLIX) 5 MG TABS tablet Take 5 mg by mouth daily.     No current facility-administered medications for this visit.    Allergies:   Penicillins, Levofloxacin, Oxycodone, and Vilazodone    Social History:   reports that she quit smoking about 3 years ago. Her smoking use included cigarettes. She smoked an average of 1 pack per day. She has never used smokeless tobacco. She reports that she does not currently use alcohol. She reports that she does not currently use drugs after having used the following drugs: Marijuana.   Family History:  family history includes Aneurysm in her mother; Heart disease in her father.    ROS:     Review of Systems  Constitutional:  Positive for malaise/fatigue.  HENT: Negative.    Eyes: Negative.   Respiratory:  Positive for shortness of breath.   Cardiovascular:  Positive for chest pain and palpitations.  Gastrointestinal: Negative.   Genitourinary: Negative.   Musculoskeletal: Negative.   Skin: Negative.   Neurological: Negative.   Endo/Heme/Allergies: Negative.   Psychiatric/Behavioral: Negative.    All other systems reviewed and are negative.   All  other systems are reviewed and negative.   PHYSICAL EXAM: VS:  BP 126/86   Pulse 81   Ht 5\' 1"  (1.549 m)   Wt 227 lb 12.8 oz (103.3 kg)   LMP 04/01/2018 (Exact Date)   SpO2 97%   BMI 43.04 kg/m  , BMI Body mass index is 43.04 kg/m. Last weight:  Wt Readings from Last 3 Encounters:  02/23/23 227 lb 12.8 oz (103.3 kg)  02/14/23 238 lb 3.2 oz (108 kg)  01/15/23 232 lb 9.6 oz (105.5 kg)  Physical Exam Constitutional:      Appearance: Normal appearance.  Cardiovascular:     Rate and Rhythm: Normal rate and regular rhythm.     Heart sounds: Normal heart sounds.  Pulmonary:     Effort: Pulmonary effort is normal.     Breath sounds: Normal breath sounds.  Musculoskeletal:     Right lower leg: No edema.     Left lower leg: No edema.  Neurological:     Mental Status: She is alert.     EKG: sinus rhythm, HR 80 bpm, poor R wave progression, T wave abnormality  Recent Labs: 01/08/2023: ALT 26; BUN 15; Creatinine, Ser 0.84; Hemoglobin 14.9; Potassium 5.2; Sodium 143; TSH 1.100    Lipid Panel    Component Value Date/Time   CHOL 109 01/08/2023 0949   TRIG 88 01/08/2023 0949   HDL 50 01/08/2023 0949   CHOLHDL 2.2 01/08/2023 0949   CHOLHDL 3.6 08/16/2019 0516   VLDL 26 08/16/2019 0516   LDLCALC 42 01/08/2023 0949    ASSESSMENT AND PLAN:    ICD-10-CM   1. Essential hypertension, benign  I10 PCV ECHOCARDIOGRAM COMPLETE    MYOCARDIAL PERFUSION IMAGING    lisinopril (ZESTRIL) 10 MG tablet    2. Bilateral carotid artery stenosis  I65.23 MYOCARDIAL PERFUSION IMAGING    US Carotid Duplex Left    3. Mixed hyperlipidemia  E78.2 MYOCARDIAL PERFUSION IMAGING    4. Current smoker  F17.200 MYOCARDIAL PERFUSION IMAGING    5. Palpitations  R00.2 metoprolol succinate (TOPROL XL) 25 MG 24 hr tablet    Holter monitor - 72 hour    6. Other chest pain  R07.89        Problem List Items Addressed This Visit       Cardiovascular and Mediastinum   Essential hypertension, benign -  Primary    Patient complains of labile blood pressure. Taking amlodipine and lisinopril/HCTZ as needed for elevated b/p. Will stop both, add lisinopril 10 mg daily.       Relevant Medications   metoprolol succinate (TOPROL XL) 25 MG 24 hr tablet   lisinopril (ZESTRIL) 10 MG tablet   Other Relevant Orders   PCV ECHOCARDIOGRAM COMPLETE   MYOCARDIAL PERFUSION IMAGING   Carotid artery stenosis    CTA of the neck 2020, RICA completely occluded. Will order carotid ultrasound to check LICA.      Relevant Medications   metoprolol succinate (TOPROL XL) 25 MG 24 hr tablet   lisinopril (ZESTRIL) 10 MG tablet   Other Relevant Orders   MYOCARDIAL PERFUSION IMAGING   US Carotid Duplex Left     Other   Hyperlipidemia    12/2022 LDL 42, continue atrovastatin 80 mg daily.       Relevant Medications   metoprolol succinate (TOPROL XL) 25 MG 24 hr tablet   lisinopril (ZESTRIL) 10 MG tablet   Other Relevant Orders   MYOCARDIAL PERFUSION IMAGING   Current smoker    Smoking cessation instruction/counseling given:  counseled patient on the dangers of tobacco use, advised patient to stop smoking, and reviewed strategies to maximize success       Relevant Orders   MYOCARDIAL PERFUSION IMAGING   Palpitations    Palpitations with history of stroke. Will order 14 day event monitor to rule out atrial fibrillation. Echo to check heart function. Add metoprolol. Will order a sleep study at next visit after all other testing done.       Relevant Medications   metoprolol succinate (TOPROL XL)  25 MG 24 hr tablet   Other Relevant Orders   Holter monitor - 72 hour   Other chest pain    Patient reports occasional chest pain associated with palpitations. EKG abnormal, will order stress test to check for blockages.         Disposition:   Return in about 4 weeks (around 03/23/2023) for after testing.    Total time spent: 30 minutes  Signed,  Adrian Blackwater, MD  02/23/2023 9:37 AM    [facility]

## 2023-02-23 NOTE — Assessment & Plan Note (Addendum)
Palpitations with history of stroke. Will order 14 day event monitor to rule out atrial fibrillation. Echo to check heart function. Add metoprolol. Will order a sleep study at next visit after all other testing done.

## 2023-02-23 NOTE — Assessment & Plan Note (Signed)
Smoking cessation instruction/counseling given:  counseled patient on the dangers of tobacco use, advised patient to stop smoking, and reviewed strategies to maximize success 

## 2023-02-23 NOTE — Assessment & Plan Note (Signed)
Patient reports occasional chest pain associated with palpitations. EKG abnormal, will order stress test to check for blockages.

## 2023-02-23 NOTE — Assessment & Plan Note (Signed)
Patient complains of labile blood pressure. Taking amlodipine and lisinopril/HCTZ as needed for elevated b/p. Will stop both, add lisinopril 10 mg daily.

## 2023-02-23 NOTE — Assessment & Plan Note (Signed)
CTA of the neck 2020, RICA completely occluded. Will order carotid ultrasound to check LICA.

## 2023-02-23 NOTE — Assessment & Plan Note (Addendum)
12/2022 LDL 42, continue atrovastatin 80 mg daily.

## 2023-02-25 ENCOUNTER — Other Ambulatory Visit: Payer: Self-pay | Admitting: Family

## 2023-02-26 ENCOUNTER — Encounter: Payer: Self-pay | Admitting: Cardiovascular Disease

## 2023-03-08 ENCOUNTER — Other Ambulatory Visit: Payer: Self-pay

## 2023-03-09 ENCOUNTER — Ambulatory Visit (INDEPENDENT_AMBULATORY_CARE_PROVIDER_SITE_OTHER): Payer: 59

## 2023-03-09 DIAGNOSIS — I371 Nonrheumatic pulmonary valve insufficiency: Secondary | ICD-10-CM

## 2023-03-09 DIAGNOSIS — I361 Nonrheumatic tricuspid (valve) insufficiency: Secondary | ICD-10-CM

## 2023-03-09 DIAGNOSIS — I10 Essential (primary) hypertension: Secondary | ICD-10-CM

## 2023-03-09 DIAGNOSIS — I351 Nonrheumatic aortic (valve) insufficiency: Secondary | ICD-10-CM

## 2023-03-13 ENCOUNTER — Ambulatory Visit (INDEPENDENT_AMBULATORY_CARE_PROVIDER_SITE_OTHER): Payer: 59

## 2023-03-13 DIAGNOSIS — E782 Mixed hyperlipidemia: Secondary | ICD-10-CM

## 2023-03-13 DIAGNOSIS — I6523 Occlusion and stenosis of bilateral carotid arteries: Secondary | ICD-10-CM | POA: Diagnosis not present

## 2023-03-13 DIAGNOSIS — I2510 Atherosclerotic heart disease of native coronary artery without angina pectoris: Secondary | ICD-10-CM

## 2023-03-13 DIAGNOSIS — F17200 Nicotine dependence, unspecified, uncomplicated: Secondary | ICD-10-CM

## 2023-03-13 DIAGNOSIS — I10 Essential (primary) hypertension: Secondary | ICD-10-CM

## 2023-03-13 HISTORY — DX: Atherosclerotic heart disease of native coronary artery without angina pectoris: I25.10

## 2023-03-13 MED ORDER — TECHNETIUM TC 99M SESTAMIBI GENERIC - CARDIOLITE
9.9000 | Freq: Once | INTRAVENOUS | Status: AC | PRN
  Administered 2023-03-13: 9.9 via INTRAVENOUS

## 2023-03-13 MED ORDER — TECHNETIUM TC 99M SESTAMIBI GENERIC - CARDIOLITE
32.4000 | Freq: Once | INTRAVENOUS | Status: AC | PRN
  Administered 2023-03-13: 32.4 via INTRAVENOUS

## 2023-03-19 ENCOUNTER — Ambulatory Visit: Payer: 59

## 2023-03-19 DIAGNOSIS — I10 Essential (primary) hypertension: Secondary | ICD-10-CM

## 2023-03-19 NOTE — Chronic Care Management (AMB) (Signed)
Follow Up Pharmacist Visit (CCM)  Wood,Terri  57 years, Female  DOB: 10/13/1966  M: [phone] Clinical Summary Next Pharmacist Follow Up: FPO on October 14th 2pm Next AWV: to be scheduled Next PCP Visit: 05/14/23 Summary for PCP:  -Patient is currently dealing with tachycardia and is consulting with Dr.S.Khan. She has a F/U on 03/23/23. Will Follow her chart - She need referral for Cologuard, Mammogram and recommended shingles shot. . Patient's Chronic Conditions: Hypertension (HTN), Cardiovascular Disease (CVD), Chronic Pain, Hyperlipidemia/Dyslipidemia (HLD), Other, Diabetes (DM), Chronic Obstructive Pulmonary Disease (COPD), Gastroesophageal Reflux Disease (GERD), Anxiety, Osteoarthritis, Hypothyroidism, Depression List Other Conditions (separated by comma): Hyperglycemia, Fibromyalgia, Lumbar Radiculopathy, Acute Respiratory Failure, Sciatica, CVA, Acute Ischemic right MCA stroke . Engagement Notes Terri Wood, joni on 03/15/2023 09:15 AM COPD addressed in 5/1 PCP note. DX code not listed in problem list.  Anxiety, GERD, Hyperkalemia, Osteoarthritis, Lumbar Radiculopathy, Depression, Hypothyroidism and Fibromyalgia listed in 2023/01/25 office note, no DX codes.  . 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 Keeps herself busy.  does this 3 times a week. Otherwise, yard work, crafts get done. Lifestyle habits: Diet: meal is meat and vegs, salad. BF and dinner. BF yogurt, toast with peanut butter. 2 cups of coffee, tea and soda. Snacks are vegetables, chips, peanuts.  Exercise: occasional water aerobics. Smoking - quitting now - not smoking any but chewing gum now.  Sleep: she has sleep apnea, snoring, feels tired all the tired. She will check sleep study when she sees Cardiology. SDOH: Accountable Health Communities Health-Related Social Needs Screening  Tool ([email or web address]) SDOH questions were documented in [employer] within the past 12 months or since hospitalization?: Yes  . Medication Adherence Does the [facility] have access to medication refill history?: No Is Patient using [facility] pharmacy?: No Name and location of [facility] Current Rx insurance plan: [employer] Are meds delivered by current pharmacy?: No - delivery not available Would patient benefit from direct intervention of clinical lead in dispensing process to optimize clinical outcomes?: No Medication organization: weekly AM /PM box Assessment:: Potentially Non-adherent . Hypertension (HTN) Most Recent BP: 126/86 Most Recent HR: 81 taken on: 02/23/2023 Care Gap: Need BP documented or last BP 140/90 or higher: Addressed Assessed today?: Yes BP today is: 107/70 Heart Rate is: 68 Goal: <130/80 mmHG Is Patient checking BP at home?: Yes Has patient experienced hypotension, dizziness, falls or bradycardia?: No We discussed: Contacting PCP office for signs and symptoms of high or low blood pressure (hypotension, dizziness, falls, headaches, edema) Assessment:: Controlled Drug: Lisinopril 10mg  daily Pharmacist Assessment: Appropriate, Effective, Safe, Accessible Drug: Metoprolol Succinate 25mg - 1 tablet daily  Pharmacist Assessment: Appropriate, Effective, Safe, Accessible . Hyperlipidemia/Dyslipidemia (HLD) Last Lipid panel on: January 25, 2023 TC (Goal<200): 109 LDL: 42 HDL (Goal>40): 50 TG (Goal<150): 88 ASCVD 10-year risk?is:: N/A due to existing ASCVD Assessed today?: No Drug: Atorvastatin 80mg - 1 tablet daily.  Pharmacist Assessment: Appropriate, Effective, Safe, Accessible . Diabetes (DM) Most recent A1C: 6.0 taken on: 01-25-23 Most Recent GFR: 82 taken on: 01-25-2023 Type: 2 Most recent microalbumin ratio: N/A Care Gap: Statin therapy needed: Addressed Care Gap: Need  A1c documented or last A1c > 9 %: Addressed Care Gap: Need eye exam documented in EMR or by claim: Needs to be addressed Care Gap: Need eGFR and uACR for kidney health evaluation: Addressed Assessed today?: No Drug: Mounjaro 15mg /0.50ml- Inject 0.37ml (12.5mg ) under the skin once weekly.  Pharmacist Assessment: Appropriate, Effective, Safe, Accessible Chronic Obstructive Pulmonary Disease (COPD) Most recent FEV1/FVC: N/A Most recent FEV1: N/A Most recent Eosinophils: 0.1 taken on: 01/08/2023 CAT Score: N/A Assessed today?: No . Anxiety Most Recent GAD-7 Score: N/A Assessed today?: Yes Completing the GAD-7 Questionnaire today?: Yes GAD-7: Over the last 2 weeks, how often have you been bothered by the following problems?: Done Feeling nervous, anxious or on edge: 2 (More than half of the days) Not being able to stop or control worrying: 2 (More than half of the days) Worrying too much about different things: 0 (Not at all) Trouble relaxing: 0 (Not at all) Being so restless that it is hard to sit still: 1 (several days) Becoming easily annoyed or irritable: 1 (several days) Feeling afraid, as in something awful might happen: 0 (Not at all) Total GAD-7 Score (please total responses for questions above): 6 Anxiety Severity: Mild Anxiety (Score 5-9) In your opinion, how do you feel your anxiety symptoms have been controlled over the past 3 months?: Stable / stayed the same We discussed: Anxiety management techniques (e.g., relaxation, deep breathing, positive visualization, reassuring self-statements)., Impact of caffeine and/or nicotine on anxiety symptoms Assessment:: Uncontrolled Drug: Duloxetine 60mg - 1 tablet two times daily.  Pharmacist Assessment: Appropriate, Query Effectiveness Drug: gabapentin 600mg  Three times a day  Pharmacist Assessment: Appropriate, Query Effectiveness Drug: Trintellix 5mg  daily Pharmacist Assessment: Appropriate, Query Effectiveness Depression Most recent  PHQ-9 Score: 0 Assessed today?: No Drug: Duloxetine 60mg - 1 tablet two times daily.  Drug: Wellbutrin XL 150mg - 1 tablet daily. GERD Assessment Assessed today?: No Drug: Omeprazole 40mg - 1 tablet daily.  . Cardiovascular Disease (CVD) Care Gap: Moderate / high intensity statin therapy needed: Addressed Assessed today?: No Drug: Clopidogrel 75mg - 1 tablet daily Drug: Aspirin 81mg - 1 tablet daily.  Hypothyroidism Most recent TSH: 1.100 taken on: 01/08/2023 Most recent T4: N/A Most recent T3: N/A Assessed today?: No Osteoarthritis Assessed today?: No Chronic Pain Assessed today?: No Drug: Gabapentin 600mg - 1 tablet three times daily General Disease Assessment Assessed today?: No Preventative Health Care Gap: Colorectal cancer screening: Needs to be addressed Care Gap: Breast cancer screening: Needs to be addressed Care Gap: Annual Wellness Visit (AWV): Addressed Immunizations needed: Zoster Plan/Follow up: Order: Cologuard, Mammogram and Shingles vaccine. Terri Wood, joni on 03/15/2023 09:49 AM HC NONBILLABLE TIME: 0:40:00   Pharmacist Interventions Intervention Details Pharmacist Interventions discussed: Yes Started Therapy: Immunizations recommended, Preventative therapy . Lynann Bologna, PharmD  Chart review Office visit Documentation 

## 2023-03-23 ENCOUNTER — Encounter: Payer: Self-pay | Admitting: Cardiovascular Disease

## 2023-03-23 ENCOUNTER — Ambulatory Visit (INDEPENDENT_AMBULATORY_CARE_PROVIDER_SITE_OTHER): Payer: 59 | Admitting: Cardiovascular Disease

## 2023-03-23 VITALS — BP 126/74 | HR 73 | Ht 61.0 in | Wt 225.0 lb

## 2023-03-23 DIAGNOSIS — F17200 Nicotine dependence, unspecified, uncomplicated: Secondary | ICD-10-CM

## 2023-03-23 DIAGNOSIS — R002 Palpitations: Secondary | ICD-10-CM

## 2023-03-23 DIAGNOSIS — R0789 Other chest pain: Secondary | ICD-10-CM

## 2023-03-23 DIAGNOSIS — R42 Dizziness and giddiness: Secondary | ICD-10-CM

## 2023-03-23 DIAGNOSIS — I10 Essential (primary) hypertension: Secondary | ICD-10-CM

## 2023-03-23 DIAGNOSIS — I259 Chronic ischemic heart disease, unspecified: Secondary | ICD-10-CM

## 2023-03-23 DIAGNOSIS — I6523 Occlusion and stenosis of bilateral carotid arteries: Secondary | ICD-10-CM

## 2023-03-23 DIAGNOSIS — I63511 Cerebral infarction due to unspecified occlusion or stenosis of right middle cerebral artery: Secondary | ICD-10-CM

## 2023-03-23 DIAGNOSIS — E782 Mixed hyperlipidemia: Secondary | ICD-10-CM

## 2023-03-23 NOTE — Patient Instructions (Signed)
Take metoprolol 1 tab night before and 1 tab 90 minutes prior to ccta

## 2023-03-23 NOTE — Progress Notes (Signed)
Cardiology Office Note   Date:  03/23/2023   ID:  Terri Wood, DOB 02-04-1966, MRN [id number]  PCP:  Miki Kins, FNP  Cardiologist:  Adrian Blackwater, MD      History of Present Illness: Terri Wood is a 57 y.o. female who presents for  Chief Complaint  Patient presents with   Follow-up    NST & ECHO Results    Palpitations  This is a recurrent problem. The current episode started in the past 7 days. The problem has been unchanged.      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   Community acquired pneumonia 01/16/2021   Depression    Fibromyalgia    Foraminal stenosis of lumbar region 02/27/2018   GERD (gastroesophageal reflux disease)    Hyperkalemia 09/13/2017   Hyperlipidemia    Hypertension    Lumbar radiculopathy 12/15/2016   Multifocal pneumonia 06/05/2021   Osteoarthritis of left hip 2019   Pneumonia    01/16/2021 and 06/05/2021   Pre-diabetes    Restless leg syndrome 02/02/2015   Sepsis (HCC) 01/16/2021   Stroke (HCC) 08/05/2019   occlusion of right middle cerebral artery     Past Surgical History:  Procedure Laterality Date   ABDOMINAL SURGERY  1996   gastric bypass; stapling; surgilite   GASTRIC BYPASS OPEN  1996   PLANTAR FASCIA SURGERY Right 2009   TARSAL TUNNEL RELEASE  2009   TOTAL HIP ARTHROPLASTY Left 04/09/2018   Procedure: TOTAL HIP ARTHROPLASTY ANTERIOR APPROACH;  Surgeon: Kennedy Bucker, MD;  Location: [facility] ORS;  Service: Orthopedics;  Laterality: Left;   TOTAL KNEE ARTHROPLASTY Right 07/12/2021   Procedure: TOTAL KNEE ARTHROPLASTY;  Surgeon: Kennedy Bucker, MD;   Location: [facility] OR[facility];  Service: Orthopedics;  Laterality: Right;   TOTAL KNEE ARTHROPLASTY Left 02/16/2022   Procedure: TOTAL KNEE ARTHROPLASTY;  Surgeon: Kennedy Bucker, MD;  Location: [facility] OR[facility];  Service: Orthopedics;  Laterality: Left;     Current Outpatient Medications  Medication Sig Dispense Refill   ACCU-CHEK GUIDE test strip USE TO CHECK BLOOD GLUCOSE ONCE DAILY 100 strip 1   Ascorbic Acid (VITAMIN C WITH ROSE HIPS) 1000 MG tablet Take 1,000 mg by mouth daily.     aspirin 81 MG chewable tablet Chew 1 tablet (81 mg total) by mouth 2 (two) times daily. 30 tablet 0   atorvastatin (LIPITOR) 80 MG tablet Take 80 mg by mouth daily.     azelastine (ASTELIN) 0.1 % nasal spray Place 2 sprays into both nostrils 2 (two) times daily. Use in each nostril as directed 30 mL 12   B Complex-C (B-COMPLEX WITH VITAMIN C) tablet Take 1 tablet by mouth daily.     Biotin 5 MG CAPS Take 5 mg by mouth daily.     buPROPion (WELLBUTRIN XL) 150 MG 24 hr tablet Take 150 mg by mouth daily.     celecoxib (CELEBREX) 200 MG capsule TAKE 1 CAPSULE BY MOUTH TWICE A DAY 60 capsule 5   Cholecalciferol (VITAMIN D3) 125 MCG (5000 UT) CAPS Take 5,000 Units by mouth daily.     cloNIDine (CATAPRES) 0.1 MG tablet  Take 0.1 mg by mouth at bedtime.     clopidogrel (PLAVIX) 75 MG tablet Take 75 mg by mouth daily.     diclofenac Sodium (VOLTAREN) 1 % GEL Apply 1 application topically 3 (three) times daily as needed (pain).     DULoxetine (CYMBALTA) 60 MG capsule Take 60 mg by mouth 2 (two) times daily.     fexofenadine (ALLEGRA) 180 MG tablet Take 1 tablet (180 mg total) by mouth daily. 90 tablet 1   gabapentin (NEURONTIN) 600 MG tablet Take 1 tablet (600 mg total) by mouth 3 (three) times daily. 90 tablet 1   GEMTESA 75 MG TABS Take 1 tablet by mouth daily.     HYDROcodone-acetaminophen (NORCO) 10-325 MG tablet Take 1 tablet by mouth every 4 (four) hours as needed.     lisinopril (ZESTRIL) 10 MG tablet Take 1 tablet (10 mg total) by  mouth daily. 30 tablet 11   metoprolol succinate (TOPROL XL) 25 MG 24 hr tablet Take 1 tablet (25 mg total) by mouth daily. 30 tablet 2   Multiple Vitamins-Minerals (MULTI FOR HER 50+ PO) Take 1 tablet by mouth daily.     Omega 3 1000 MG CAPS Take 1,000 mg by mouth daily with breakfast.      omeprazole (PRILOSEC) 40 MG capsule Take 40 mg by mouth daily.     pimecrolimus (ELIDEL) 1 % cream Apply 1 application. topically in the morning and at bedtime. (Patient not taking: Reported on 03/19/2023)     polyethylene glycol (MIRALAX / GLYCOLAX) 17 g packet Take 17 g by mouth daily as needed for mild constipation. 14 each 0   Specialty Vitamins Products (MENOPAUSE RELIEF PO) Take 2 tablets by mouth daily. Amberen     tirzepatide (MOUNJARO) 12.5 MG/0.5ML Pen INJECT 0.5ML (12.5MG ) UNDER THE SKIN ONCE WEEKLY (Patient not taking: Reported on 03/19/2023) 2 mL 2   tirzepatide (MOUNJARO) 15 MG/0.5ML Pen Inject 15 mg into the skin once a week. 2 mL 3   vortioxetine HBr (TRINTELLIX) 5 MG TABS tablet Take 5 mg by mouth daily.     No current facility-administered medications for this visit.    Allergies:   Penicillins, Levofloxacin, Oxycodone, and Vilazodone    Social History:   reports that she quit smoking about 3 years ago. Her smoking use included cigarettes. She smoked an average of 1 pack per day. She has never used smokeless tobacco. She reports that she does not currently use alcohol. She reports that she does not currently use drugs after having used the following drugs: Marijuana.   Family History:  family history includes Aneurysm in her mother; Heart disease in her father.    ROS:     Review of Systems  Constitutional: Negative.   HENT: Negative.    Eyes: Negative.   Respiratory: Negative.    Cardiovascular:  Positive for palpitations.  Gastrointestinal: Negative.   Genitourinary: Negative.   Musculoskeletal: Negative.   Skin: Negative.   Neurological: Negative.   Endo/Heme/Allergies:  Negative.   Psychiatric/Behavioral: Negative.    All other systems reviewed and are negative.     All other systems are reviewed and negative.    PHYSICAL EXAM: VS:  BP 126/74   Pulse 73   Ht 5\' 1"  (1.549 m)   Wt 225 lb (102.1 kg)   LMP 04/01/2018 (Exact Date)   SpO2 94%   BMI 42.51 kg/m  , BMI Body mass index is 42.51 kg/m. Last weight:  Wt Readings from Last 3 Encounters:  03/23/23 225 lb (102.1 kg)  02/23/23 227 lb 12.8 oz (103.3 kg)  02/14/23 238 lb 3.2 oz (108 kg)     Physical Exam Constitutional:      Appearance: Normal appearance.  Cardiovascular:     Rate and Rhythm: Normal rate and regular rhythm.     Heart sounds: Normal heart sounds.  Pulmonary:     Effort: Pulmonary effort is normal.     Breath sounds: Normal breath sounds.  Musculoskeletal:     Right lower leg: No edema.     Left lower leg: No edema.  Neurological:     Mental Status: She is alert.       EKG:   Recent Labs: 01/08/2023: ALT 26; BUN 15; Creatinine, Ser 0.84; Hemoglobin 14.9; Potassium 5.2; Sodium 143; TSH 1.100    Lipid Panel    Component Value Date/Time   CHOL 109 01/08/2023 0949   TRIG 88 01/08/2023 0949   HDL 50 01/08/2023 0949   CHOLHDL 2.2 01/08/2023 0949   CHOLHDL 3.6 08/16/2019 0516   VLDL 26 08/16/2019 0516   LDLCALC 42 01/08/2023 0949      Other studies Reviewed: Additional studies/ records that were reviewed today include:  Review of the above records demonstrates:       No data to display            ASSESSMENT AND PLAN:    ICD-10-CM   1. Dizziness  R42 CT CORONARY MORPH W/CTA COR W/SCORE W/CA W/CM &/OR WO/CM    Basic metabolic panel    2. Palpitations  R00.2 CT CORONARY MORPH W/CTA COR W/SCORE W/CA W/CM &/OR WO/CM    Basic metabolic panel    3. Essential hypertension, benign  I10 CT CORONARY MORPH W/CTA COR W/SCORE W/CA W/CM &/OR WO/CM    Basic metabolic panel    4. Bilateral carotid artery stenosis  I65.23 CT CORONARY MORPH W/CTA COR W/SCORE  W/CA W/CM &/OR WO/CM    Basic metabolic panel    5. Cerebrovascular accident (CVA) due to occlusion of right middle cerebral artery (HCC)  I63.511 CT CORONARY MORPH W/CTA COR W/SCORE W/CA W/CM &/OR WO/CM    Basic metabolic panel    6. Mixed hyperlipidemia  E78.2 CT CORONARY MORPH W/CTA COR W/SCORE W/CA W/CM &/OR WO/CM    Basic metabolic panel    7. Smoking  F17.200 CT CORONARY MORPH W/CTA COR W/SCORE W/CA W/CM &/OR WO/CM    Basic metabolic panel    8. Other chest pain  R07.89 CT CORONARY MORPH W/CTA COR W/SCORE W/CA W/CM &/OR WO/CM    Basic metabolic panel   stress test showed ischaemia in LAD, advise CCTA    9. Chest pain due to myocardial ischemia, unspecified ischemic chest pain type  I25.9 CT CORONARY MORPH W/CTA COR W/SCORE W/CA W/CM &/OR WO/CM    Basic metabolic panel       Problem List Items Addressed This Visit       Cardiovascular and Mediastinum   Essential hypertension, benign   Relevant Orders   CT CORONARY MORPH W/CTA COR W/SCORE W/CA W/CM &/OR WO/CM   Basic metabolic panel   Cerebrovascular accident (CVA) due to occlusion of right middle cerebral artery (HCC)   Relevant Orders   CT CORONARY MORPH W/CTA COR W/SCORE W/CA W/CM &/OR WO/CM   Basic metabolic panel   Carotid artery stenosis   Relevant Orders   CT CORONARY MORPH W/CTA COR W/SCORE W/CA W/CM &/OR WO/CM   Basic metabolic panel     Other  Hyperlipidemia   Relevant Orders   CT CORONARY MORPH W/CTA COR W/SCORE W/CA W/CM &/OR WO/CM   Basic metabolic panel   Palpitations   Relevant Orders   CT CORONARY MORPH W/CTA COR W/SCORE W/CA W/CM &/OR WO/CM   Basic metabolic panel   Other chest pain   Relevant Orders   CT CORONARY MORPH W/CTA COR W/SCORE W/CA W/CM &/OR WO/CM   Basic metabolic panel   Other Visit Diagnoses     Dizziness    -  Primary   Relevant Orders   CT CORONARY MORPH W/CTA COR W/SCORE W/CA W/CM &/OR WO/CM   Basic metabolic panel   Smoking       Relevant Orders   CT CORONARY MORPH  W/CTA COR W/SCORE W/CA W/CM &/OR WO/CM   Basic metabolic panel   Chest pain due to myocardial ischemia, unspecified ischemic chest pain type       Relevant Orders   CT CORONARY MORPH W/CTA COR W/SCORE W/CA W/CM &/OR WO/CM   Basic metabolic panel          Disposition:   Return in about 2 weeks (around 04/06/2023) for ccta and f/u.    Total time spent: 30 minutes  Signed,  Adrian Blackwater, MD  03/23/2023 9:58 AM    [facility]

## 2023-03-24 LAB — BASIC METABOLIC PANEL
BUN/Creatinine Ratio: 18 (ref 9–23)
BUN: 16 mg/dL (ref 6–24)
CO2: 27 mmol/L (ref 20–29)
Calcium: 10.4 mg/dL — ABNORMAL HIGH (ref 8.7–10.2)
Chloride: 104 mmol/L (ref 96–106)
Creatinine, Ser: 0.87 mg/dL (ref 0.57–1.00)
Glucose: 89 mg/dL (ref 70–99)
Potassium: 6.4 mmol/L — ABNORMAL HIGH (ref 3.5–5.2)
Sodium: 144 mmol/L (ref 134–144)
eGFR: 78 mL/min/{1.73_m2} (ref >59)

## 2023-03-28 ENCOUNTER — Other Ambulatory Visit: Payer: Self-pay | Admitting: Cardiovascular Disease

## 2023-03-28 ENCOUNTER — Other Ambulatory Visit: Payer: 59

## 2023-03-28 ENCOUNTER — Other Ambulatory Visit: Payer: Self-pay | Admitting: Family

## 2023-03-28 DIAGNOSIS — R002 Palpitations: Secondary | ICD-10-CM

## 2023-03-28 DIAGNOSIS — I10 Essential (primary) hypertension: Secondary | ICD-10-CM

## 2023-03-29 ENCOUNTER — Ambulatory Visit (INDEPENDENT_AMBULATORY_CARE_PROVIDER_SITE_OTHER): Payer: 59

## 2023-03-29 DIAGNOSIS — E782 Mixed hyperlipidemia: Secondary | ICD-10-CM

## 2023-03-29 DIAGNOSIS — I6523 Occlusion and stenosis of bilateral carotid arteries: Secondary | ICD-10-CM

## 2023-03-29 DIAGNOSIS — R42 Dizziness and giddiness: Secondary | ICD-10-CM

## 2023-03-29 DIAGNOSIS — I63511 Cerebral infarction due to unspecified occlusion or stenosis of right middle cerebral artery: Secondary | ICD-10-CM

## 2023-03-29 DIAGNOSIS — R072 Precordial pain: Secondary | ICD-10-CM

## 2023-03-29 DIAGNOSIS — I10 Essential (primary) hypertension: Secondary | ICD-10-CM

## 2023-03-29 DIAGNOSIS — F17200 Nicotine dependence, unspecified, uncomplicated: Secondary | ICD-10-CM

## 2023-03-29 DIAGNOSIS — I259 Chronic ischemic heart disease, unspecified: Secondary | ICD-10-CM

## 2023-03-29 DIAGNOSIS — R002 Palpitations: Secondary | ICD-10-CM

## 2023-03-29 DIAGNOSIS — R0789 Other chest pain: Secondary | ICD-10-CM

## 2023-03-29 LAB — BASIC METABOLIC PANEL
BUN/Creatinine Ratio: 20 (ref 9–23)
BUN: 16 mg/dL (ref 6–24)
CO2: 28 mmol/L (ref 20–29)
Calcium: 9.9 mg/dL (ref 8.7–10.2)
Chloride: 100 mmol/L (ref 96–106)
Creatinine, Ser: 0.82 mg/dL (ref 0.57–1.00)
Glucose: 78 mg/dL (ref 70–99)
Potassium: 5.1 mmol/L (ref 3.5–5.2)
Sodium: 141 mmol/L (ref 134–144)
eGFR: 84 mL/min/{1.73_m2} (ref >59)

## 2023-03-29 MED ORDER — IOHEXOL 350 MG/ML SOLN
100.0000 mL | Freq: Once | INTRAVENOUS | Status: AC | PRN
  Administered 2023-03-29: 100 mL via INTRAVENOUS

## 2023-04-05 ENCOUNTER — Encounter: Payer: Self-pay | Admitting: Cardiovascular Disease

## 2023-04-05 ENCOUNTER — Ambulatory Visit (INDEPENDENT_AMBULATORY_CARE_PROVIDER_SITE_OTHER): Payer: 59 | Admitting: Cardiovascular Disease

## 2023-04-05 VITALS — BP 112/60 | HR 73 | Ht 61.0 in | Wt 225.0 lb

## 2023-04-05 DIAGNOSIS — R002 Palpitations: Secondary | ICD-10-CM | POA: Diagnosis not present

## 2023-04-05 DIAGNOSIS — I63511 Cerebral infarction due to unspecified occlusion or stenosis of right middle cerebral artery: Secondary | ICD-10-CM | POA: Diagnosis not present

## 2023-04-05 DIAGNOSIS — F17209 Nicotine dependence, unspecified, with unspecified nicotine-induced disorders: Secondary | ICD-10-CM | POA: Diagnosis not present

## 2023-04-05 DIAGNOSIS — E782 Mixed hyperlipidemia: Secondary | ICD-10-CM

## 2023-04-05 DIAGNOSIS — E1165 Type 2 diabetes mellitus with hyperglycemia: Secondary | ICD-10-CM

## 2023-04-05 DIAGNOSIS — I10 Essential (primary) hypertension: Secondary | ICD-10-CM | POA: Diagnosis not present

## 2023-04-05 DIAGNOSIS — M797 Fibromyalgia: Secondary | ICD-10-CM | POA: Diagnosis not present

## 2023-04-05 DIAGNOSIS — R0789 Other chest pain: Secondary | ICD-10-CM

## 2023-04-05 NOTE — Progress Notes (Signed)
Cardiology Office Note   Date:  04/05/2023   ID:  Terri Wood, DOB [birth year], MRN [id number]  PCP:  Miki Kins, FNP  Cardiologist:  Adrian Blackwater, MD      History of Present Illness: Terri Wood is a 57 y.o. female who presents for  Chief Complaint  Patient presents with   Follow-up    CCTA Results    Feels tires also.  Chest Pain  This is a new problem. The current episode started 1 to 4 weeks ago. The onset quality is sudden. The problem occurs 2 to 4 times per day. The pain is present in the lateral region. The pain is at a severity of 3/10. The pain is mild. The quality of the pain is described as sharp. The pain radiates to the right neck and right shoulder (after fall).      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   Community acquired pneumonia 01/16/2021   Depression    Fibromyalgia    Foraminal stenosis of lumbar region 02/27/2018   GERD (gastroesophageal reflux disease)    Hyperkalemia 09/13/2017   Hyperlipidemia    Hypertension    Lumbar radiculopathy 12/15/2016   Multifocal pneumonia 06/05/2021   Osteoarthritis of left hip 2019   Pneumonia    01/16/2021 and 06/05/2021   Pre-diabetes    Restless leg syndrome 02/02/2015   Sepsis (HCC) 01/16/2021   Stroke (HCC) 08/05/2019   occlusion of right middle cerebral artery     Past Surgical History:  Procedure Laterality Date   ABDOMINAL SURGERY  1996   gastric bypass; stapling; surgilite   GASTRIC BYPASS OPEN  1996   PLANTAR FASCIA SURGERY Right 2009   TARSAL TUNNEL RELEASE  2009   TOTAL HIP ARTHROPLASTY Left 04/09/2018    Procedure: TOTAL HIP ARTHROPLASTY ANTERIOR APPROACH;  Surgeon: Kennedy Bucker, MD;  Location: [facility];  Service: Orthopedics;  Laterality: Left;   TOTAL KNEE ARTHROPLASTY Right 07/12/2021   Procedure: TOTAL KNEE ARTHROPLASTY;  Surgeon: Kennedy Bucker, MD;  Location: [facility];  Service: Orthopedics;  Laterality: Right;   TOTAL KNEE ARTHROPLASTY Left 02/16/2022   Procedure: TOTAL KNEE ARTHROPLASTY;  Surgeon: Kennedy Bucker, MD;  Location: [facility];  Service: Orthopedics;  Laterality: Left;     Current Outpatient Medications  Medication Sig Dispense Refill   ACCU-CHEK GUIDE test strip USE TO CHECK BLOOD GLUCOSE ONCE DAILY 100 strip 1   Ascorbic Acid (VITAMIN C WITH ROSE HIPS) 1000 MG tablet Take 1,000 mg by mouth daily.     aspirin 81 MG chewable tablet Chew 1 tablet (81 mg total) by mouth 2 (two) times daily. 30 tablet 0   atorvastatin (LIPITOR) 80 MG tablet Take 80 mg by mouth daily.     azelastine (ASTELIN) 0.1 % nasal spray Place 2 sprays into both nostrils 2 (two) times daily. Use in each nostril as directed 30 mL 12   B Complex-C (B-COMPLEX WITH VITAMIN C) tablet Take 1 tablet by mouth daily.     Biotin 5 MG CAPS Take 5 mg by mouth daily.     buPROPion (WELLBUTRIN XL) 150 MG 24  hr tablet Take 150 mg by mouth daily.     celecoxib (CELEBREX) 200 MG capsule TAKE 1 CAPSULE BY MOUTH TWICE A DAY 60 capsule 5   Cholecalciferol (VITAMIN D3) 125 MCG (5000 UT) CAPS Take 5,000 Units by mouth daily.     cloNIDine (CATAPRES) 0.1 MG tablet Take 0.1 mg by mouth at bedtime.     clopidogrel (PLAVIX) 75 MG tablet Take 75 mg by mouth daily.     diclofenac Sodium (VOLTAREN) 1 % GEL Apply 1 application topically 3 (three) times daily as needed (pain).     DULoxetine (CYMBALTA) 60 MG capsule Take 60 mg by mouth 2 (two) times daily.     fexofenadine (ALLEGRA) 180 MG tablet Take 1 tablet (180 mg total) by mouth daily. 90 tablet 1   gabapentin (NEURONTIN) 600 MG tablet TAKE 1 TABLET BY MOUTH THREE TIMES A DAY 90  tablet 1   GEMTESA 75 MG TABS Take 1 tablet by mouth daily.     HYDROcodone-acetaminophen (NORCO) 10-325 MG tablet Take 1 tablet by mouth every 4 (four) hours as needed.     lisinopril (ZESTRIL) 10 MG tablet Take 1 tablet (10 mg total) by mouth daily. 30 tablet 11   metoprolol succinate (TOPROL-XL) 25 MG 24 hr tablet TAKE 1 TABLET (25 MG TOTAL) BY MOUTH DAILY. 90 tablet 0   Multiple Vitamins-Minerals (MULTI FOR HER 50+ PO) Take 1 tablet by mouth daily.     Omega 3 1000 MG CAPS Take 1,000 mg by mouth daily with breakfast.      omeprazole (PRILOSEC) 40 MG capsule Take 40 mg by mouth daily.     pimecrolimus (ELIDEL) 1 % cream Apply 1 application. topically in the morning and at bedtime. (Patient not taking: Reported on 03/19/2023)     polyethylene glycol (MIRALAX / GLYCOLAX) 17 g packet Take 17 g by mouth daily as needed for mild constipation. 14 each 0   Specialty Vitamins Products (MENOPAUSE RELIEF PO) Take 2 tablets by mouth daily. Amberen     tirzepatide (MOUNJARO) 12.5 MG/0.5ML Pen INJECT 0.5ML (12.5MG ) UNDER THE SKIN ONCE WEEKLY (Patient not taking: Reported on 03/19/2023) 2 mL 2   tirzepatide (MOUNJARO) 15 MG/0.5ML Pen Inject 15 mg into the skin once a week. 2 mL 3   vortioxetine HBr (TRINTELLIX) 5 MG TABS tablet Take 5 mg by mouth daily.     No current facility-administered medications for this visit.    Allergies:   Penicillins, Levofloxacin, Oxycodone, and Vilazodone    Social History:   reports that she quit smoking about 3 years ago. Her smoking use included cigarettes. She smoked an average of 1 pack per day. She has never used smokeless tobacco. She reports that she does not currently use alcohol. She reports that she does not currently use drugs after having used the following drugs: Marijuana.   Family History:  family history includes Aneurysm in her mother; Heart disease in her father.    ROS:     Review of Systems  Constitutional: Negative.   HENT: Negative.    Eyes:  Negative.   Respiratory: Negative.    Cardiovascular:  Positive for chest pain.  Gastrointestinal: Negative.   Genitourinary: Negative.   Musculoskeletal: Negative.   Skin: Negative.   Neurological: Negative.   Endo/Heme/Allergies: Negative.   Psychiatric/Behavioral: Negative.    All other systems reviewed and are negative.     All other systems are reviewed and negative.    PHYSICAL EXAM: VS:  BP 112/60  Pulse 73   Ht 5\' 1"  (1.549 m)   Wt 225 lb (102.1 kg)   LMP 04/01/2018 (Exact Date)   SpO2 96%   BMI 42.51 kg/m  , BMI Body mass index is 42.51 kg/m. Last weight:  Wt Readings from Last 3 Encounters:  04/05/23 225 lb (102.1 kg)  03/23/23 225 lb (102.1 kg)  02/23/23 227 lb 12.8 oz (103.3 kg)     Physical Exam Constitutional:      Appearance: Normal appearance.  Cardiovascular:     Rate and Rhythm: Normal rate and regular rhythm.     Heart sounds: Normal heart sounds.  Pulmonary:     Effort: Pulmonary effort is normal.     Breath sounds: Normal breath sounds.  Musculoskeletal:     Right lower leg: No edema.     Left lower leg: No edema.  Neurological:     Mental Status: She is alert.       EKG:   Recent Labs: 01/08/2023: ALT 26; Hemoglobin 14.9; TSH 1.100 03/28/2023: BUN 16; Creatinine, Ser 0.82; Potassium 5.1; Sodium 141    Lipid Panel    Component Value Date/Time   CHOL 109 01/08/2023 0949   TRIG 88 01/08/2023 0949   HDL 50 01/08/2023 0949   CHOLHDL 2.2 01/08/2023 0949   CHOLHDL 3.6 08/16/2019 0516   VLDL 26 08/16/2019 0516   LDLCALC 42 01/08/2023 0949      Other studies Reviewed: Additional studies/ records that were reviewed today include:  Review of the above records demonstrates:       No data to display            ASSESSMENT AND PLAN:    ICD-10-CM   1. Essential hypertension, benign  I10    BP stable    2. Acute ischemic right MCA stroke (HCC)  I63.511     3. Type 2 diabetes mellitus with hyperglycemia, without  long-term current use of insulin (HCC)  E11.65     4. Fibromyalgia  M79.7     5. Mixed hyperlipidemia  E78.2     6. Palpitations  R00.2 Holter monitor - 72 hour   hlter being placed, and tiredness due sleep apnea    7. Chest pain, non-cardiac  R07.89    ca score was over 600, with 40% disease in lCX, treat medically. Chest pain likely due to fall.       Problem List Items Addressed This Visit       Cardiovascular and Mediastinum   Acute ischemic right MCA stroke (HCC)   Essential hypertension, benign - Primary     Endocrine   Type 2 diabetes mellitus with hyperglycemia, without long-term current use of insulin (HCC)     Other   Hyperlipidemia   Fibromyalgia   Palpitations   Other Visit Diagnoses     Chest pain, non-cardiac       ca score was over 600, with 40% disease in lCX, treat medically. Chest pain likely due to fall.          Disposition:   Return in about 4 weeks (around 05/03/2023).    Total time spent: 35 minutes  Signed,  Adrian Blackwater, MD  04/05/2023 10:03 AM    [facility]

## 2023-04-11 ENCOUNTER — Encounter: Payer: Self-pay | Admitting: Family

## 2023-04-16 ENCOUNTER — Other Ambulatory Visit: Payer: Self-pay

## 2023-04-19 DIAGNOSIS — R002 Palpitations: Secondary | ICD-10-CM | POA: Diagnosis not present

## 2023-04-25 ENCOUNTER — Other Ambulatory Visit: Payer: Self-pay | Admitting: Sports Medicine

## 2023-04-25 ENCOUNTER — Ambulatory Visit (INDEPENDENT_AMBULATORY_CARE_PROVIDER_SITE_OTHER): Payer: 59 | Admitting: Family

## 2023-04-25 VITALS — BP 139/78 | HR 78 | Ht 61.0 in | Wt 230.6 lb

## 2023-04-25 DIAGNOSIS — G4733 Obstructive sleep apnea (adult) (pediatric): Secondary | ICD-10-CM

## 2023-04-25 DIAGNOSIS — E1165 Type 2 diabetes mellitus with hyperglycemia: Secondary | ICD-10-CM

## 2023-04-25 DIAGNOSIS — W19XXXA Unspecified fall, initial encounter: Secondary | ICD-10-CM

## 2023-04-25 DIAGNOSIS — M7551 Bursitis of right shoulder: Secondary | ICD-10-CM

## 2023-04-25 DIAGNOSIS — F331 Major depressive disorder, recurrent, moderate: Secondary | ICD-10-CM | POA: Diagnosis not present

## 2023-04-25 DIAGNOSIS — J441 Chronic obstructive pulmonary disease with (acute) exacerbation: Secondary | ICD-10-CM | POA: Insufficient documentation

## 2023-04-25 DIAGNOSIS — R7303 Prediabetes: Secondary | ICD-10-CM

## 2023-04-25 DIAGNOSIS — E782 Mixed hyperlipidemia: Secondary | ICD-10-CM

## 2023-04-25 DIAGNOSIS — M25511 Pain in right shoulder: Secondary | ICD-10-CM

## 2023-04-25 DIAGNOSIS — E559 Vitamin D deficiency, unspecified: Secondary | ICD-10-CM

## 2023-04-25 DIAGNOSIS — I10 Essential (primary) hypertension: Secondary | ICD-10-CM | POA: Diagnosis not present

## 2023-04-25 DIAGNOSIS — E538 Deficiency of other specified B group vitamins: Secondary | ICD-10-CM

## 2023-04-25 NOTE — Progress Notes (Signed)
Established Patient Office Visit  Subjective:  Patient ID: Terri Wood, female    DOB: [birth year]  Age: 57 y.o. MRN: [id number]  Chief Complaint  Patient presents with   Follow-up    2 mo F/U    Patient is here today for her 2 months follow up.  She has been feeling well since last appointment.   She does have additional concerns to discuss today.  Dr. Welton Flakes has asked that we get her set up for a Sleep Study, as he is concerned this may be contributing to cardiac issues.   Labs are due today. She needs refills.   I have reviewed her active problem list, medication list, allergies, notes from last encounter, lab results for her appointment today.      No other concerns at this time.   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   Community acquired pneumonia 01/16/2021   Depression    Fibromyalgia    Foraminal stenosis of lumbar region 02/27/2018   GERD (gastroesophageal reflux disease)    Hyperkalemia 09/13/2017   Hyperlipidemia    Hypertension    Lumbar radiculopathy 12/15/2016   Multifocal pneumonia 06/05/2021   Osteoarthritis of left hip 2019   Pneumonia    01/16/2021 and 06/05/2021   Pre-diabetes    Restless leg syndrome 02/02/2015   Sepsis (HCC) 01/16/2021   Stroke (HCC) 08/05/2019   occlusion of right middle cerebral artery    Past Surgical History:  Procedure Laterality Date   ABDOMINAL SURGERY  1996   gastric bypass; stapling; surgilite   GASTRIC BYPASS OPEN  1996   PLANTAR FASCIA SURGERY Right 2009   TARSAL TUNNEL RELEASE  2009   TOTAL HIP ARTHROPLASTY Left 04/09/2018   Procedure:  TOTAL HIP ARTHROPLASTY ANTERIOR APPROACH;  Surgeon: Kennedy Bucker, MD;  Location: [facility];  Service: Orthopedics;  Laterality: Left;   TOTAL KNEE ARTHROPLASTY Right 07/12/2021   Procedure: TOTAL KNEE ARTHROPLASTY;  Surgeon: Kennedy Bucker, MD;  Location: [facility];  Service: Orthopedics;  Laterality: Right;   TOTAL KNEE ARTHROPLASTY Left 02/16/2022   Procedure: TOTAL KNEE ARTHROPLASTY;  Surgeon: Kennedy Bucker, MD;  Location: [facility];  Service: Orthopedics;  Laterality: Left;    Social History   Socioeconomic History   Marital status: Legally Separated    Spouse name: Not on file   Number of children: 0   Years of education: Not on file   Highest education level: Not on file  Occupational History   Not on file  Tobacco Use   Smoking status: Former    Current packs/day: 0.00    Types: Cigarettes    Quit date: 07/2019    Years since quitting: 3.7   Smokeless tobacco: Never  Vaping Use   Vaping status: Never Used  Substance and Sexual Activity   Alcohol use: Not Currently   Drug use: Not Currently    Types: Marijuana    Comment: none since 2019   Sexual activity: Not Currently  Other Topics Concern   Not on file  Social History Narrative   Lives with mother   Social Determinants of Corporate investment banker  Strain: Low Risk  (01/16/2022)   Received from [facility], [facility]   Overall Financial Resource Strain (CARDIA)    Difficulty of Paying Living Expenses: Not very hard  Food Insecurity: Unknown (01/16/2022)   Received from [facility], [facility]   Hunger Vital Sign    Worried About Running Out of Food in the Last Year: Never true    Ran Out of Food in the Last Year: Not on file  Transportation Needs: No Transportation Needs (01/16/2022)   Received from [facility], [facility]   [facility] - Transportation    In the past 12 months, has lack of transportation kept  you from medical appointments or from getting medications?: No    Lack of Transportation (Non-Medical): No  Physical Activity: Inactive (01/16/2022)   Received from [facility], [facility]   Exercise Vital Sign    Days of Exercise per Week: 0 days    Minutes of Exercise per Session: 0 min  Stress: No Stress Concern Present (01/16/2022)   Received from [facility], [facility]   [employer] of Occupational Health - Occupational Stress Questionnaire    Feeling of Stress : Not at all  Social Connections: Moderately Integrated (01/16/2022)   Received from [facility], [facility]   Social Connection and Isolation Panel [NHANES]    Frequency of Communication with Friends and Family: More than three times a week    Frequency of Social Gatherings with Friends and Family: Never    Attends Religious Services: More than 4 times per year    Active Member of [employer] or Organizations: Yes    Attends Engineer, structural: More than 4 times per year    Marital Status: Separated  Intimate Partner Violence: Not on file    Family History  Problem Relation Age of Onset   Aneurysm Mother    Heart disease Father     Allergies  Allergen Reactions   Penicillins Hives, Other (See Comments) and Rash    TOLERATED ROCEPHIN AND CEFAZOLIN  Did it involve swelling of the face/tongue/throat, SOB, or low BP? Yes  Did it involve sudden or severe rash/hives, skin peeling, or any reaction on the inside of your mouth or nose? No  Did you need to seek medical attention at a hospital or doctor's office? No  When did it last happen? Within the past 10 years   If all above answers are "NO", may proceed with cephalosporin use.  Did it involve swelling of the face/tongue/throat, SOB, or low BP? Yes  Did it involve sudden or severe rash/hives, skin peeling, or any reaction on the inside of your mouth  or nose? No  Did you need to seek medical attention at a hospital or doctor's office? No  When did it last happen? Within the past 10 years   If all above answers are "NO", may proceed with cephalosporin use.  Has patient had a PCN reaction causing immediate rash, facial/tongue/throat swelling, SOB or lightheadedness with hypotension: Yes  Has patient had a PCN reaction causing severe rash involving mucus membranes or skin necrosis: No  Has patient had a PCN reaction that required hospitalization: No  Has patient had a PCN reaction occurring within the last 10 years: Yes  If all of the above answers are "NO", then may proceed with Cephalosporin use.   Levofloxacin Other (See  Comments)    Dizziness   Oxycodone    Vilazodone Other (See Comments)    Review of Systems  All other systems reviewed and are negative.      Objective:   BP 139/78   Pulse 78   Ht 5\' 1"  (1.549 m)   Wt 230 lb 9.6 oz (104.6 kg)   LMP 04/01/2018 (Exact Date)   SpO2 98%   BMI 43.57 kg/m   Vitals:   04/25/23 1122  BP: 139/78  Pulse: 78  Height: 5\' 1"  (1.549 m)  Weight: 230 lb 9.6 oz (104.6 kg)  SpO2: 98%  BMI (Calculated): 43.59    Physical Exam Vitals and nursing note reviewed.  Constitutional:      Appearance: Normal appearance. She is normal weight.  HENT:     Head: Normocephalic.  Eyes:     Extraocular Movements: Extraocular movements intact.     Conjunctiva/sclera: Conjunctivae normal.     Pupils: Pupils are equal, round, and reactive to light.  Cardiovascular:     Rate and Rhythm: Normal rate.  Pulmonary:     Effort: Pulmonary effort is normal.  Neurological:     General: No focal deficit present.     Mental Status: She is alert and oriented to person, place, and time. Mental status is at baseline.  Psychiatric:        Mood and Affect: Mood normal.        Behavior: Behavior normal.        Thought Content: Thought content normal.        Judgment: Judgment normal.      No  results found for any visits on 04/25/23.  Recent Results (from the past 2160 hour(s))  POC CREATINE & ALBUMIN,URINE     Status: Normal   Collection Time: 02/14/23 12:02 PM  Result Value Ref Range   Microalbumin Ur, POC <30 mg/L   Creatinine, POC 50 mg/dL   Albumin/Creatinine Ratio, Urine, POC 10   Basic metabolic panel     Status: Abnormal   Collection Time: 03/23/23 10:10 AM  Result Value Ref Range   Glucose 89 70 - 99 mg/dL   BUN 16 6 - 24 mg/dL   Creatinine, Ser 1.61 0.57 - 1.00 mg/dL   eGFR 78 >09 UE/AVW/0.98   BUN/Creatinine Ratio 18 9 - 23   Sodium 144 134 - 144 mmol/L   Potassium 6.4 (H) 3.5 - 5.2 mmol/L   Chloride 104 96 - 106 mmol/L   CO2 27 20 - 29 mmol/L   Calcium 10.4 (H) 8.7 - 10.2 mg/dL  Basic metabolic panel     Status: None   Collection Time: 03/28/23  2:27 PM  Result Value Ref Range   Glucose 78 70 - 99 mg/dL   BUN 16 6 - 24 mg/dL   Creatinine, Ser 1.19 0.57 - 1.00 mg/dL   eGFR 84 >14 NW/GNF/6.21   BUN/Creatinine Ratio 20 9 - 23   Sodium 141 134 - 144 mmol/L   Potassium 5.1 3.5 - 5.2 mmol/L   Chloride 100 96 - 106 mmol/L   CO2 28 20 - 29 mmol/L   Calcium 9.9 8.7 - 10.2 mg/dL  Vitamin H08     Status: None   Collection Time: 04/27/23 10:01 AM  Result Value Ref Range   Vitamin B-12 571 232 - 1,245 pg/mL  Hemoglobin A1c     Status: Abnormal   Collection Time: 04/27/23 10:01 AM  Result Value Ref Range   Hgb A1c MFr Bld 6.0 (  H) 4.8 - 5.6 %    Comment:          Prediabetes: 5.7 - 6.4          Diabetes: >6.4          Glycemic control for adults with diabetes: <7.0    Est. average glucose Bld gHb Est-mCnc 126 mg/dL  [id number]     Status: Abnormal   Collection Time: 04/27/23 10:01 AM  Result Value Ref Range   Glucose 78 70 - 99 mg/dL   BUN 17 6 - 24 mg/dL   Creatinine, Ser 0.86 0.57 - 1.00 mg/dL   eGFR 76 >57 QI/ONG/2.95   BUN/Creatinine Ratio 19 9 - 23   Sodium 137 134 - 144 mmol/L   Potassium 4.8 3.5 - 5.2 mmol/L   Chloride 98 96 - 106 mmol/L    CO2 24 20 - 29 mmol/L   Calcium 9.4 8.7 - 10.2 mg/dL   Total Protein 6.8 6.0 - 8.5 g/dL   Albumin 4.3 3.8 - 4.9 g/dL   Globulin, Total 2.5 1.5 - 4.5 g/dL   Bilirubin Total 0.4 0.0 - 1.2 mg/dL   Alkaline Phosphatase 126 (H) 44 - 121 IU/L   AST 21 0 - 40 IU/L   ALT 24 0 - 32 IU/L  CBC With Differential     Status: None   Collection Time: 04/27/23 10:01 AM  Result Value Ref Range   WBC 6.2 3.4 - 10.8 x10E3/uL   RBC 4.42 3.77 - 5.28 x10E6/uL   Hemoglobin 13.2 11.1 - 15.9 g/dL   Hematocrit 28.4 13.2 - 46.6 %   MCV 94 79 - 97 fL   MCH 29.9 26.6 - 33.0 pg   MCHC 31.9 31.5 - 35.7 g/dL   RDW 44.0 10.2 - 72.5 %   Neutrophils 52 Not Estab. %   Lymphs 35 Not Estab. %   Monocytes 10 Not Estab. %   Eos 2 Not Estab. %   Basos 1 Not Estab. %   Neutrophils Absolute 3.2 1.4 - 7.0 x10E3/uL   Lymphocytes Absolute 2.2 0.7 - 3.1 x10E3/uL   Monocytes Absolute 0.6 0.1 - 0.9 x10E3/uL   EOS (ABSOLUTE) 0.1 0.0 - 0.4 x10E3/uL   Basophils Absolute 0.0 0.0 - 0.2 x10E3/uL   Immature Granulocytes 0 Not Estab. %   Immature Grans (Abs) 0.0 0.0 - 0.1 x10E3/uL    Comment: **Effective May 14, 2023, profile [id number] CBC/Differential**   (No Platelet) will be made non-orderable. [facility] Offers:   [id number] CBC With Differential/Platelet   VITAMIN D 25 Hydroxy (Vit-D Deficiency, Fractures)     Status: None   Collection Time: 04/27/23 10:01 AM  Result Value Ref Range   Vit D, 25-Hydroxy 44.7 30.0 - 100.0 ng/mL    Comment: Vitamin D deficiency has been defined by the Institute of Medicine and an Endocrine Society practice guideline as a level of serum 25-OH vitamin D less than 20 ng/mL (1,2). The Endocrine Society went on to further define vitamin D insufficiency as a level between 21 and 29 ng/mL (2). 1. IOM (Institute of Medicine). 2010. Dietary reference    intakes for calcium and D. [place]: The    [employer]. 2. Holick MF, Binkley Boyce, Bischoff-Ferrari HA, et al.    Evaluation, treatment,  and prevention of vitamin D    deficiency: an Endocrine Society clinical practice    guideline. JCEM. 2011 Jul; 96(7):1911-30.   Lipid panel     Status: None   Collection Time: 04/27/23  10:01 AM  Result Value Ref Range   Cholesterol, Total 112 100 - 199 mg/dL   Triglycerides 79 0 - 149 mg/dL   HDL 55 >16 mg/dL   VLDL Cholesterol Cal 16 5 - 40 mg/dL   LDL Chol Calc (NIH) 41 0 - 99 mg/dL   Chol/HDL Ratio 2.0 0.0 - 4.4 ratio    Comment:                                   T. Chol/HDL Ratio                                             Men  Women                               1/2 Avg.Risk  3.4    3.3                                   Avg.Risk  5.0    4.4                                2X Avg.Risk  9.6    7.1                                3X Avg.Risk 23.4   11.0        Assessment & Plan:   Problem List Items Addressed This Visit       Active Problems   Essential hypertension, benign    Blood pressure well controlled with current medications.  Continue current therapy.  Will reassess at follow up.       Relevant Orders   CBC With Differential (Completed)   CMP14+EGFR (Completed)   Hyperlipidemia    Checking labs today.  Continue current therapy for lipid control. Will modify as needed based on labwork results.       Relevant Orders   Lipid panel (Completed)   CBC With Differential (Completed)   CMP14+EGFR (Completed)   Major depressive disorder, recurrent episode, moderate (HCC)    Patient stable.  Well controlled with current therapy.   Continue current meds.       Relevant Orders   CBC With Differential (Completed)   CMP14+EGFR (Completed)   Type 2 diabetes mellitus with hyperglycemia, without long-term current use of insulin (HCC)    Checking labs today. Will call pt. With results  Continue current diabetes POC, as patient has been well controlled on current regimen.  Will adjust meds if needed based on labs.       Relevant Orders   Hemoglobin A1c  (Completed)   Vitamin D deficiency, unspecified   Relevant Orders   VITAMIN D 25 Hydroxy (Vit-D Deficiency, Fractures) (Completed)   CBC With Differential (Completed)   CMP14+EGFR (Completed)   COPD with acute exacerbation (HCC) - Primary    Patient stable.  Well controlled with current therapy.   Continue current meds.       Relevant Orders   CBC With Differential (Completed)   CMP14+EGFR (Completed)  Other Visit Diagnoses     Obstructive sleep apnea       Sleep study ordered today.   Relevant Orders   Ambulatory referral to Sleep Studies   CBC With Differential (Completed)   CMP14+EGFR (Completed)   B12 deficiency due to diet       Relevant Orders   CBC With Differential (Completed)   CMP14+EGFR (Completed)   Vitamin B12 (Completed)       Return in about 3 months (around 07/26/2023) for F/U.   Total time spent: 30 minutes  Miki Kins, FNP  04/25/2023   This document may have been prepared by [facility] Voice Recognition software and as such may include unintentional dictation errors.

## 2023-04-27 ENCOUNTER — Other Ambulatory Visit: Payer: 59

## 2023-04-27 ENCOUNTER — Ambulatory Visit (INDEPENDENT_AMBULATORY_CARE_PROVIDER_SITE_OTHER): Payer: 59 | Admitting: Cardiovascular Disease

## 2023-04-27 ENCOUNTER — Encounter: Payer: Self-pay | Admitting: Cardiovascular Disease

## 2023-04-27 ENCOUNTER — Other Ambulatory Visit: Payer: Self-pay | Admitting: Family

## 2023-04-27 VITALS — BP 125/79 | HR 67 | Ht 61.0 in | Wt 231.4 lb

## 2023-04-27 DIAGNOSIS — R7303 Prediabetes: Secondary | ICD-10-CM

## 2023-04-27 DIAGNOSIS — I10 Essential (primary) hypertension: Secondary | ICD-10-CM

## 2023-04-27 DIAGNOSIS — I6523 Occlusion and stenosis of bilateral carotid arteries: Secondary | ICD-10-CM

## 2023-04-27 DIAGNOSIS — J441 Chronic obstructive pulmonary disease with (acute) exacerbation: Secondary | ICD-10-CM

## 2023-04-27 DIAGNOSIS — E538 Deficiency of other specified B group vitamins: Secondary | ICD-10-CM

## 2023-04-27 DIAGNOSIS — G4733 Obstructive sleep apnea (adult) (pediatric): Secondary | ICD-10-CM

## 2023-04-27 DIAGNOSIS — F331 Major depressive disorder, recurrent, moderate: Secondary | ICD-10-CM

## 2023-04-27 DIAGNOSIS — E559 Vitamin D deficiency, unspecified: Secondary | ICD-10-CM

## 2023-04-27 DIAGNOSIS — E782 Mixed hyperlipidemia: Secondary | ICD-10-CM

## 2023-04-27 DIAGNOSIS — I63511 Cerebral infarction due to unspecified occlusion or stenosis of right middle cerebral artery: Secondary | ICD-10-CM | POA: Diagnosis not present

## 2023-04-27 DIAGNOSIS — R739 Hyperglycemia, unspecified: Secondary | ICD-10-CM

## 2023-04-27 DIAGNOSIS — R0789 Other chest pain: Secondary | ICD-10-CM

## 2023-04-27 DIAGNOSIS — M797 Fibromyalgia: Secondary | ICD-10-CM

## 2023-04-27 MED ORDER — METOPROLOL TARTRATE 50 MG PO TABS
50.0000 mg | ORAL_TABLET | Freq: Two times a day (BID) | ORAL | 11 refills | Status: DC
Start: 2023-04-27 — End: 2023-06-11

## 2023-04-27 MED ORDER — PANTOPRAZOLE SODIUM 40 MG PO TBEC
40.0000 mg | DELAYED_RELEASE_TABLET | Freq: Every day | ORAL | 1 refills | Status: DC
Start: 2023-04-27 — End: 2023-05-21

## 2023-04-27 NOTE — Progress Notes (Signed)
Cardiology Office Note   Date:  04/27/2023   ID:  ANNECY STEFF, DOB [birth year], MRN [id number]  PCP:  Miki Kins, FNP  Cardiologist:  Adrian Blackwater, MD      History of Present Illness: Terri Wood is a 57 y.o. female who presents for  Chief Complaint  Patient presents with   Follow-up    Holter results    Has chest pain, and fell with bruises on chest.  Patient apparently fell and has bruises on the right side of the rib cage with some ecchymosis.  Chest "pain could be due to that or due to acid reflux.  Chest Pain  This is a recurrent problem. The current episode started more than 1 month ago. The problem occurs 2 to 4 times per day. The problem has been waxing and waning.      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   Community acquired pneumonia 01/16/2021   Depression    Fibromyalgia    Foraminal stenosis of lumbar region 02/27/2018   GERD (gastroesophageal reflux disease)    Hyperkalemia 09/13/2017   Hyperlipidemia    Hypertension    Lumbar radiculopathy 12/15/2016   Multifocal pneumonia 06/05/2021   Osteoarthritis of left hip 2019   Pneumonia    01/16/2021 and 06/05/2021   Pre-diabetes    Restless leg syndrome 02/02/2015   Sepsis (HCC) 01/16/2021   Stroke (HCC) 08/05/2019   occlusion of right middle cerebral artery     Past Surgical History:  Procedure Laterality Date   ABDOMINAL SURGERY  1996   gastric bypass; stapling; surgilite   GASTRIC BYPASS OPEN  1996   PLANTAR FASCIA SURGERY Right 2009   TARSAL TUNNEL RELEASE  2009   TOTAL HIP ARTHROPLASTY Left 04/09/2018    Procedure: TOTAL HIP ARTHROPLASTY ANTERIOR APPROACH;  Surgeon: Kennedy Bucker, MD;  Location: [facility];  Service: Orthopedics;  Laterality: Left;   TOTAL KNEE ARTHROPLASTY Right 07/12/2021   Procedure: TOTAL KNEE ARTHROPLASTY;  Surgeon: Kennedy Bucker, MD;  Location: [facility];  Service: Orthopedics;  Laterality: Right;   TOTAL KNEE ARTHROPLASTY Left 02/16/2022   Procedure: TOTAL KNEE ARTHROPLASTY;  Surgeon: Kennedy Bucker, MD;  Location: [facility];  Service: Orthopedics;  Laterality: Left;     Current Outpatient Medications  Medication Sig Dispense Refill   metoprolol tartrate (LOPRESSOR) 50 MG tablet Take 1 tablet (50 mg total) by mouth 2 (two) times daily. 60 tablet 11   pantoprazole (PROTONIX) 40 MG tablet Take 1 tablet (40 mg total) by mouth daily. 30 tablet 1   ACCU-CHEK GUIDE test strip USE TO CHECK BLOOD GLUCOSE ONCE DAILY 100 strip 1   Ascorbic Acid (VITAMIN C WITH ROSE HIPS) 1000 MG tablet Take 1,000 mg by mouth daily.     aspirin 81 MG chewable tablet Chew 1 tablet (81 mg total) by mouth 2 (two) times daily. 30 tablet 0   atorvastatin (LIPITOR) 80 MG tablet Take 80 mg by mouth daily.     azelastine (ASTELIN) 0.1 % nasal spray Place 2 sprays into both nostrils 2 (two) times daily. Use in each nostril as directed 30 mL  12   B Complex-C (B-COMPLEX WITH VITAMIN C) tablet Take 1 tablet by mouth daily.     Biotin 5 MG CAPS Take 5 mg by mouth daily.     buPROPion (WELLBUTRIN XL) 150 MG 24 hr tablet Take 150 mg by mouth daily.     celecoxib (CELEBREX) 200 MG capsule TAKE 1 CAPSULE BY MOUTH TWICE A DAY 60 capsule 5   Cholecalciferol (VITAMIN D3) 125 MCG (5000 UT) CAPS Take 5,000 Units by mouth daily.     cloNIDine (CATAPRES) 0.1 MG tablet Take 0.1 mg by mouth at bedtime.     clopidogrel (PLAVIX) 75 MG tablet Take 75 mg by mouth daily.     diclofenac Sodium (VOLTAREN) 1 % GEL Apply 1 application topically 3 (three) times daily as needed (pain).     DULoxetine (CYMBALTA) 60 MG capsule Take 60 mg by  mouth 2 (two) times daily.     fexofenadine (ALLEGRA) 180 MG tablet Take 1 tablet (180 mg total) by mouth daily. 90 tablet 1   gabapentin (NEURONTIN) 600 MG tablet TAKE 1 TABLET BY MOUTH THREE TIMES A DAY 90 tablet 1   GEMTESA 75 MG TABS Take 1 tablet by mouth daily.     HYDROcodone-acetaminophen (NORCO) 10-325 MG tablet Take 1 tablet by mouth every 4 (four) hours as needed.     lisinopril (ZESTRIL) 10 MG tablet Take 1 tablet (10 mg total) by mouth daily. 30 tablet 11   Multiple Vitamins-Minerals (MULTI FOR HER 50+ PO) Take 1 tablet by mouth daily.     Omega 3 1000 MG CAPS Take 1,000 mg by mouth daily with breakfast.      pimecrolimus (ELIDEL) 1 % cream Apply 1 application. topically in the morning and at bedtime. (Patient not taking: Reported on 03/19/2023)     polyethylene glycol (MIRALAX / GLYCOLAX) 17 g packet Take 17 g by mouth daily as needed for mild constipation. 14 each 0   Specialty Vitamins Products (MENOPAUSE RELIEF PO) Take 2 tablets by mouth daily. Amberen     tirzepatide [facility]) 12.5 MG/0.5ML Pen INJECT 0.5ML (12.5MG ) UNDER THE SKIN ONCE WEEKLY (Patient not taking: Reported on 03/19/2023) 2 mL 2   tirzepatide (MOUNJARO) 15 MG/0.5ML Pen Inject 15 mg into the skin once a week. 2 mL 3   vortioxetine HBr (TRINTELLIX) 5 MG TABS tablet Take 5 mg by mouth daily.     No current facility-administered medications for this visit.    Allergies:   Penicillins, Levofloxacin, Oxycodone, and Vilazodone    Social History:   reports that she quit smoking about 3 years ago. Her smoking use included cigarettes. She has never used smokeless tobacco. She reports that she does not currently use alcohol. She reports that she does not currently use drugs after having used the following drugs: Marijuana.   Family History:  family history includes Aneurysm in her mother; Heart disease in her father.    ROS:     Review of Systems  Constitutional: Negative.   HENT: Negative.    Eyes: Negative.    Respiratory: Negative.    Cardiovascular:  Positive for chest pain.  Gastrointestinal: Negative.   Genitourinary: Negative.   Musculoskeletal: Negative.   Skin: Negative.   Neurological: Negative.   Endo/Heme/Allergies: Negative.   Psychiatric/Behavioral: Negative.    All other systems reviewed and are negative.     All other systems are reviewed and negative.    PHYSICAL EXAM: VS:  BP 125/79   Pulse 67   Ht 5'  1" (1.549 m)   Wt 231 lb 6.4 oz (105 kg)   LMP 04/01/2018 (Exact Date)   SpO2 97%   BMI 43.72 kg/m  , BMI Body mass index is 43.72 kg/m. Last weight:  Wt Readings from Last 3 Encounters:  04/27/23 231 lb 6.4 oz (105 kg)  04/25/23 230 lb 9.6 oz (104.6 kg)  04/05/23 225 lb (102.1 kg)     Physical Exam Constitutional:      Appearance: Normal appearance.  Cardiovascular:     Rate and Rhythm: Normal rate and regular rhythm.     Heart sounds: Normal heart sounds.  Pulmonary:     Effort: Pulmonary effort is normal.     Breath sounds: Normal breath sounds.  Musculoskeletal:     Right lower leg: No edema.     Left lower leg: No edema.  Neurological:     Mental Status: She is alert.       EKG:   Recent Labs: 01/08/2023: ALT 26; Hemoglobin 14.9; TSH 1.100 03/28/2023: BUN 16; Creatinine, Ser 0.82; Potassium 5.1; Sodium 141    Lipid Panel    Component Value Date/Time   CHOL 109 01/08/2023 0949   TRIG 88 01/08/2023 0949   HDL 50 01/08/2023 0949   CHOLHDL 2.2 01/08/2023 0949   CHOLHDL 3.6 08/16/2019 0516   VLDL 26 08/16/2019 0516   LDLCALC 42 01/08/2023 0949      Other studies Reviewed: Additional studies/ records that were reviewed today include:  Review of the above records demonstrates:       No data to display            ASSESSMENT AND PLAN:    ICD-10-CM   1. Acute ischemic right MCA stroke (HCC)  I63.511 pantoprazole (PROTONIX) 40 MG tablet    metoprolol tartrate (LOPRESSOR) 50 MG tablet   Holter monitor showed sinus tachycardia  112 bpm no acute changes with no evidence of arrhythmia .  Advised changing metoprolol to 50 twice daily    2. Bilateral carotid artery stenosis  I65.23 pantoprazole (PROTONIX) 40 MG tablet    metoprolol tartrate (LOPRESSOR) 50 MG tablet    3. Essential hypertension, benign  I10 pantoprazole (PROTONIX) 40 MG tablet    metoprolol tartrate (LOPRESSOR) 50 MG tablet    4. Borderline hyperglycemia  R73.9 pantoprazole (PROTONIX) 40 MG tablet    metoprolol tartrate (LOPRESSOR) 50 MG tablet    5. Fibromyalgia  M79.7 pantoprazole (PROTONIX) 40 MG tablet    metoprolol tartrate (LOPRESSOR) 50 MG tablet    6. Mixed hyperlipidemia  E78.2 pantoprazole (PROTONIX) 40 MG tablet    metoprolol tartrate (LOPRESSOR) 50 MG tablet    7. Other chest pain  R07.89    Chest pain is most likely due to acid reflux.  Advised changing omeprazole to Protonix 40 once a day.       Problem List Items Addressed This Visit       Cardiovascular and Mediastinum   Acute ischemic right MCA stroke (HCC) - Primary   Relevant Medications   pantoprazole (PROTONIX) 40 MG tablet   metoprolol tartrate (LOPRESSOR) 50 MG tablet   Essential hypertension, benign   Relevant Medications   pantoprazole (PROTONIX) 40 MG tablet   metoprolol tartrate (LOPRESSOR) 50 MG tablet   Carotid artery stenosis   Relevant Medications   pantoprazole (PROTONIX) 40 MG tablet   metoprolol tartrate (LOPRESSOR) 50 MG tablet     Other   Hyperlipidemia   Relevant Medications   pantoprazole (PROTONIX) 40 MG tablet  metoprolol tartrate (LOPRESSOR) 50 MG tablet   Borderline hyperglycemia   Relevant Medications   pantoprazole (PROTONIX) 40 MG tablet   metoprolol tartrate (LOPRESSOR) 50 MG tablet   Fibromyalgia   Relevant Medications   pantoprazole (PROTONIX) 40 MG tablet   metoprolol tartrate (LOPRESSOR) 50 MG tablet   Other chest pain       Disposition:   Return in about 6 weeks (around 06/08/2023).    Total time spent: 30  minutes  Signed,  Adrian Blackwater, MD  04/27/2023 10:23 AM    [facility]

## 2023-04-28 LAB — CBC WITH DIFFERENTIAL
Basophils Absolute: 0 10*3/uL (ref 0.0–0.2)
Basos: 1 %
EOS (ABSOLUTE): 0.1 10*3/uL (ref 0.0–0.4)
Eos: 2 %
Hematocrit: 41.4 % (ref 34.0–46.6)
Hemoglobin: 13.2 g/dL (ref 11.1–15.9)
Immature Grans (Abs): 0 10*3/uL (ref 0.0–0.1)
Immature Granulocytes: 0 %
Lymphocytes Absolute: 2.2 10*3/uL (ref 0.7–3.1)
Lymphs: 35 %
MCH: 29.9 pg (ref 26.6–33.0)
MCHC: 31.9 g/dL (ref 31.5–35.7)
MCV: 94 fL (ref 79–97)
Monocytes Absolute: 0.6 10*3/uL (ref 0.1–0.9)
Monocytes: 10 %
Neutrophils Absolute: 3.2 10*3/uL (ref 1.4–7.0)
Neutrophils: 52 %
RBC: 4.42 x10E6/uL (ref 3.77–5.28)
RDW: 12.8 % (ref 11.7–15.4)
WBC: 6.2 10*3/uL (ref 3.4–10.8)

## 2023-04-28 LAB — CMP14+EGFR
ALT: 24 IU/L (ref 0–32)
AST: 21 IU/L (ref 0–40)
Albumin: 4.3 g/dL (ref 3.8–4.9)
Alkaline Phosphatase: 126 IU/L — ABNORMAL HIGH (ref 44–121)
BUN/Creatinine Ratio: 19 (ref 9–23)
BUN: 17 mg/dL (ref 6–24)
Bilirubin Total: 0.4 mg/dL (ref 0.0–1.2)
CO2: 24 mmol/L (ref 20–29)
Calcium: 9.4 mg/dL (ref 8.7–10.2)
Chloride: 98 mmol/L (ref 96–106)
Creatinine, Ser: 0.89 mg/dL (ref 0.57–1.00)
Globulin, Total: 2.5 g/dL (ref 1.5–4.5)
Glucose: 78 mg/dL (ref 70–99)
Potassium: 4.8 mmol/L (ref 3.5–5.2)
Sodium: 137 mmol/L (ref 134–144)
Total Protein: 6.8 g/dL (ref 6.0–8.5)
eGFR: 76 mL/min/{1.73_m2} (ref >59)

## 2023-04-28 LAB — LIPID PANEL
Chol/HDL Ratio: 2 ratio (ref 0.0–4.4)
Cholesterol, Total: 112 mg/dL (ref 100–199)
HDL: 55 mg/dL (ref >39)
LDL Chol Calc (NIH): 41 mg/dL (ref 0–99)
Triglycerides: 79 mg/dL (ref 0–149)
VLDL Cholesterol Cal: 16 mg/dL (ref 5–40)

## 2023-04-28 LAB — HEMOGLOBIN A1C
Est. average glucose Bld gHb Est-mCnc: 126 mg/dL
Hgb A1c MFr Bld: 6 % — ABNORMAL HIGH (ref 4.8–5.6)

## 2023-04-28 LAB — VITAMIN D 25 HYDROXY (VIT D DEFICIENCY, FRACTURES): Vit D, 25-Hydroxy: 44.7 ng/mL (ref 30.0–100.0)

## 2023-04-28 LAB — VITAMIN B12: Vitamin B-12: 571 pg/mL (ref 232–1245)

## 2023-04-29 ENCOUNTER — Encounter: Payer: Self-pay | Admitting: Family

## 2023-04-29 NOTE — Assessment & Plan Note (Signed)
Patient stable.  Well controlled with current therapy.   Continue current meds.  

## 2023-04-29 NOTE — Assessment & Plan Note (Signed)
Checking labs today.  Continue current therapy for lipid control. Will modify as needed based on labwork results.  

## 2023-04-29 NOTE — Assessment & Plan Note (Signed)
Blood pressure well controlled with current medications.  Continue current therapy.  Will reassess at follow up.  

## 2023-04-29 NOTE — Assessment & Plan Note (Signed)
Checking labs today. Will call pt. With results  Continue current diabetes POC, as patient has been well controlled on current regimen.  Will adjust meds if needed based on labs.  

## 2023-05-01 ENCOUNTER — Ambulatory Visit: Payer: 59 | Admitting: Plastic Surgery

## 2023-05-01 ENCOUNTER — Other Ambulatory Visit: Payer: Self-pay | Admitting: Family

## 2023-05-03 ENCOUNTER — Ambulatory Visit
Admission: RE | Admit: 2023-05-03 | Discharge: 2023-05-03 | Disposition: A | Discharge status: Home / Self Care | Payer: 59 | Source: Ambulatory Visit | Attending: Sports Medicine | Admitting: Sports Medicine

## 2023-05-03 DIAGNOSIS — Y939 Activity, unspecified: Secondary | ICD-10-CM | POA: Diagnosis not present

## 2023-05-03 DIAGNOSIS — M25511 Pain in right shoulder: Secondary | ICD-10-CM | POA: Insufficient documentation

## 2023-05-03 DIAGNOSIS — Y929 Unspecified place or not applicable: Secondary | ICD-10-CM | POA: Diagnosis not present

## 2023-05-03 DIAGNOSIS — M7551 Bursitis of right shoulder: Secondary | ICD-10-CM | POA: Diagnosis not present

## 2023-05-03 DIAGNOSIS — W19XXXA Unspecified fall, initial encounter: Secondary | ICD-10-CM | POA: Insufficient documentation

## 2023-05-10 ENCOUNTER — Other Ambulatory Visit: Payer: Self-pay

## 2023-05-11 ENCOUNTER — Ambulatory Visit
Admission: EM | Admit: 2023-05-11 | Discharge: 2023-05-11 | Disposition: A | Discharge status: Home / Self Care | Payer: 59 | Attending: Emergency Medicine | Admitting: Emergency Medicine

## 2023-05-11 ENCOUNTER — Ambulatory Visit (INDEPENDENT_AMBULATORY_CARE_PROVIDER_SITE_OTHER): Payer: 59

## 2023-05-11 DIAGNOSIS — M79671 Pain in right foot: Secondary | ICD-10-CM

## 2023-05-11 NOTE — Discharge Instructions (Signed)
Follow-up with your primary care provider or a podiatrist on Monday.

## 2023-05-11 NOTE — ED Triage Notes (Addendum)
Patient to Urgent Care with complaints of right sided foot pain. Pain starts in her heel and radiates into the arch of her foot. Woke up during the night with symptoms. Denies any known injury.  Reports she had surgery for plantar fasciitis years ago.

## 2023-05-11 NOTE — ED Provider Notes (Signed)
Renaldo Fiddler    CSN: [id number] Arrival date & time: 05/11/23  1434      History   Chief Complaint Chief Complaint  Patient presents with   Foot Pain    HPI Terri Wood is a 57 y.o. female.  Accompanied by her sister, patient presents with right foot pain since last night.  No falls or injury.  No wounds, redness, bruising, numbness, weakness, or other symptoms.  No treatments at home.  Patient reports history of plantar fasciitis surgery on this foot years ago.  Her medical history includes chronic back pain, CVA, hypertension, fibromyalgia, diabetes, COPD.  The history is provided by the patient and medical records.    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   Community acquired pneumonia 01/16/2021   Depression    Fibromyalgia    Foraminal stenosis of lumbar region 02/27/2018   GERD (gastroesophageal reflux disease)    Hyperkalemia 09/13/2017   Hyperlipidemia    Hypertension    Lumbar radiculopathy 12/15/2016   Multifocal pneumonia 06/05/2021   Osteoarthritis of left hip 2019   Pneumonia    01/16/2021 and 06/05/2021   Pre-diabetes    Restless leg syndrome 02/02/2015   Sepsis (HCC) 01/16/2021   Stroke (HCC) 08/05/2019   occlusion of right middle cerebral artery    Patient Active Problem List   Diagnosis Date Noted   COPD with acute exacerbation (HCC) 04/25/2023   Palpitations 02/23/2023   Other chest pain 02/23/2023   Vitamin D deficiency, unspecified 01/08/2023   Lumbosacral spondylosis without myelopathy 05/23/2022   S/P TKR (total knee replacement) using cement, left 02/16/2022    S/P TKR (total knee replacement) using cement, right 07/12/2021   Generalized weakness 06/05/2021   Hyperlipidemia 04/27/2021   CVA, old, hemiparesis (HCC) 01/16/2021   Cerebrovascular accident (CVA) due to occlusion of right middle cerebral artery (HCC) 05/03/2020   Acute ischemic right MCA stroke (HCC) 08/15/2019   Carotid artery stenosis 08/05/2019   Cerebrovascular disease 08/05/2019   Type 2 diabetes mellitus with hyperglycemia, without long-term current use of insulin (HCC) 04/07/2019   Myalgia 11/08/2018   Avascular necrosis of left femoral head (HCC) 04/09/2018   Pain, joint, multiple sites 02/28/2018   Primary osteoarthritis of right knee 08/07/2017   Current smoker 02/25/2017   Foraminal stenosis of lumbar region 12/15/2016   Perimenopausal symptoms 12/15/2016   Essential hypertension, benign 02/11/2016   Chronic midline low back pain with right-sided sciatica 02/11/2016   Body mass index 40.0-44.9, adult (HCC) 02/11/2016   Numbness and tingling in both hands 02/11/2016   Sciatica 06/25/2015   Major depressive disorder, recurrent episode, moderate (HCC) 02/02/2015   Restless legs syndrome 02/02/2015   Fibromyalgia 02/02/2015    Past Surgical History:  Procedure Laterality Date   ABDOMINAL SURGERY  1996   gastric bypass; stapling; surgilite   GASTRIC BYPASS OPEN  1996   PLANTAR FASCIA SURGERY Right 2009   TARSAL TUNNEL RELEASE  2009   TOTAL HIP ARTHROPLASTY Left 04/09/2018   Procedure: TOTAL HIP ARTHROPLASTY ANTERIOR APPROACH;  Surgeon: Kennedy Bucker, MD;  Location: [facility];  Service: Orthopedics;  Laterality: Left;  TOTAL KNEE ARTHROPLASTY Right 07/12/2021   Procedure: TOTAL KNEE ARTHROPLASTY;  Surgeon: Kennedy Bucker, MD;  Location: [facility];  Service: Orthopedics;  Laterality: Right;   TOTAL KNEE ARTHROPLASTY Left 02/16/2022   Procedure: TOTAL KNEE ARTHROPLASTY;  Surgeon: Kennedy Bucker, MD;  Location: [facility];  Service: Orthopedics;  Laterality: Left;    OB  History     Gravida  0   Para  0   Term  0   Preterm  0   AB  0   Living         SAB  0   IAB  0   Ectopic  0   Multiple      Live Births               Home Medications    Prior to Admission medications   Medication Sig Start Date End Date Taking? Authorizing Provider  ACCU-CHEK GUIDE test strip USE TO CHECK BLOOD GLUCOSE ONCE DAILY 12/27/22   Miki Kins, FNP  Ascorbic Acid (VITAMIN C WITH ROSE HIPS) 1000 MG tablet Take 1,000 mg by mouth daily.    [provider]  aspirin 81 MG chewable tablet Chew 1 tablet (81 mg total) by mouth 2 (two) times daily. 02/20/22   Evon Slack, PA-C  atorvastatin (LIPITOR) 80 MG tablet Take 80 mg by mouth daily. 01/31/22   [provider]  azelastine (ASTELIN) 0.1 % nasal spray Place 2 sprays into both nostrils 2 (two) times daily. Use in each nostril as directed 01/08/23   Miki Kins, FNP  B Complex-C (B-COMPLEX WITH VITAMIN C) tablet Take 1 tablet by mouth daily.    [provider]  Biotin 5 MG CAPS Take 5 mg by mouth daily.    [provider]  buPROPion (WELLBUTRIN XL) 150 MG 24 hr tablet Take 150 mg by mouth daily.    [provider]  celecoxib (CELEBREX) 200 MG capsule TAKE 1 CAPSULE BY MOUTH TWICE A DAY 02/14/23   Miki Kins, FNP  Cholecalciferol (VITAMIN D3) 125 MCG (5000 UT) CAPS Take 5,000 Units by mouth daily.    [provider]  cloNIDine (CATAPRES) 0.1 MG tablet Take 0.1 mg by mouth at bedtime. 01/31/22   [provider]  clopidogrel (PLAVIX) 75 MG tablet Take 75 mg by mouth daily.    [provider]  diclofenac Sodium (VOLTAREN) 1 % GEL Apply 1 application topically 3 (three) times daily as needed (pain).    [provider]  DULoxetine (CYMBALTA) 60 MG capsule Take 60 mg by mouth 2 (two) times daily.    [provider]  fexofenadine (ALLEGRA) 180 MG tablet Take 1 tablet (180 mg total) by mouth daily. 01/13/23   Miki Kins, FNP  gabapentin (NEURONTIN) 600 MG tablet TAKE 1 TABLET BY MOUTH THREE TIMES A DAY 03/29/23   Miki Kins, FNP  GEMTESA 75 MG TABS Take 1 tablet by mouth daily. 06/03/21   [provider]  HYDROcodone-acetaminophen (NORCO) 10-325 MG tablet Take 1 tablet by mouth every 4 (four) hours as needed. 02/06/23   [provider]  lisinopril (ZESTRIL) 10 MG tablet Take 1 tablet (10 mg total) by mouth daily. 02/23/23 02/23/24  Laurier Nancy, MD  lisinopril-hydrochlorothiazide (ZESTORETIC) 20-25 MG tablet TAKE 1 TABLET BY MOUTH EVERY DAY 05/01/23   Miki Kins, FNP  metoprolol tartrate (LOPRESSOR) 50 MG tablet Take 1 tablet (50 mg total) by mouth 2 (two) times daily. 04/27/23 04/26/24  Laurier Nancy, MD  Multiple Vitamins-Minerals (MULTI FOR HER 50+ PO) Take 1 tablet by mouth daily.    [provider]  Omega 3 1000 MG CAPS Take 1,000 mg by mouth daily with breakfast.     [provider]  pantoprazole (PROTONIX) 40 MG tablet Take 1 tablet (40 mg total) by mouth daily. 04/27/23 04/26/24  Laurier Nancy, MD  polyethylene glycol (MIRALAX / GLYCOLAX) 17 g packet Take 17 g by mouth daily as needed for mild constipation. 07/13/21   Evon Slack, PA-C  Specialty Vitamins Products (MENOPAUSE RELIEF PO) Take 2 tablets by mouth daily. Amberen    [provider]  tirzepatide [facility]gg[facility] Keen) 15 MG/0.5ML Pen Inject 15 mg into the skin once a week. 02/14/23   Miki Kins, FNP  vortioxetine HBr (TRINTELLIX) 5 MG TABS tablet Take 5 mg by mouth daily.    [provider]    Family History Family History  Problem Relation Age of Onset   Aneurysm Mother    Heart disease Father     Social History Social History   Tobacco Use   Smoking status: Former    Current packs/day: 0.00    Types: Cigarettes    Quit date: 07/2019    Years since quitting: 3.8   Smokeless tobacco: Never  Vaping Use   Vaping status: Never Used  Substance Use Topics    Alcohol use: Not Currently   Drug use: Not Currently    Types: Marijuana    Comment: none since 2019     Allergies   Penicillins, Levofloxacin, Oxycodone, and Vilazodone   Review of Systems Review of Systems  Constitutional:  Negative for chills and fever.  Musculoskeletal:  Positive for arthralgias. Negative for gait problem and joint swelling.  Skin:  Negative for color change, rash and wound.  Neurological:  Negative for weakness and numbness.     Physical Exam Triage Vital Signs ED Triage Vitals [05/11/23 1447]  Encounter Vitals Group     BP      Systolic BP Percentile      Diastolic BP Percentile      Pulse Rate 64     Resp 18     Temp 97.7 F (36.5 C)     Temp src      SpO2 95 %     Weight      Height      Head Circumference      Peak Flow      Pain Score      Pain Loc      Pain Education      Exclude from Growth Chart    No data found.  Updated Vital Signs BP 124/70   Pulse 64   Temp 97.7 F (36.5 C)   Resp 18   LMP 04/01/2018 (Exact Date)   SpO2 95%   Visual Acuity Right Eye Distance:   Left Eye Distance:   Bilateral Distance:    Right Eye Near:   Left Eye Near:    Bilateral Near:     Physical Exam Vitals and nursing note reviewed.  Constitutional:      General: She is not in acute distress.    Appearance: She is well-developed.  HENT:     Mouth/Throat:     Mouth: Mucous membranes are moist.  Cardiovascular:     Rate and Rhythm: Normal rate and regular rhythm.     Heart sounds: Normal heart sounds.  Pulmonary:     Effort:  Pulmonary effort is normal. No respiratory distress.     Breath sounds: Normal breath sounds.  Musculoskeletal:        General: Tenderness present. No swelling or deformity. Normal range of motion.     Cervical back: Neck supple.     Comments: Mild tenderness on plantar right foot.  No edema, wounds, erythema, ecchymosis.  Skin:    General: Skin is warm and dry.     Capillary Refill: Capillary refill takes  less than 2 seconds.     Findings: No bruising, erythema, lesion or rash.  Neurological:     General: No focal deficit present.     Mental Status: She is alert and oriented to person, place, and time.     Sensory: No sensory deficit.     Motor: No weakness.     Gait: Gait normal.      UC Treatments / Results  Labs (all labs ordered are listed, but only abnormal results are displayed) Labs Reviewed - No data to display  EKG   Radiology DG Foot Complete Right  Result Date: 05/11/2023 CLINICAL DATA:  Right foot pain, no known injury. EXAM: RIGHT FOOT COMPLETE - 3+ VIEW COMPARISON:  None Available. FINDINGS: Hammertoe deformity of the second toe. Otherwise normal alignment. No acute fracture. No erosive change. Midfoot degenerative change with dorsal spurring. Mild first metatarsal-phalangeal joint degenerative change. There is a moderate-sized plantar calcaneal spur. Small fragmented Achilles tendon enthesophyte. Suggestion of mild generalized soft tissue edema. IMPRESSION: 1. No acute findings. 2. Hammertoe deformity of the second toe. 3. Midfoot and first metatarsal-phalangeal joint degenerative change. 4. Moderate-sized plantar calcaneal spur. Electronically Signed   By: Narda Rutherford M.D.   On: 05/11/2023 15:39    Procedures Procedures (including critical care time)  Medications Ordered in UC Medications - No data to display  Initial Impression / Assessment and Plan / UC Course  I have reviewed the triage vital signs and the nursing notes.  Pertinent labs & imaging results that were available during my care of the patient were reviewed by me and considered in my medical decision making (see chart for details).    Right foot pain.  X-ray shows degenerative changes, hammertoe of second toe, calcaneal spur.  Discussed symptomatic treatment including rest and elevation.  Instructed patient to follow-up with her PCP or podiatrist on Monday.  Education provided on foot pain.  She  agrees to plan of care.  Final Clinical Impressions(s) / UC Diagnoses   Final diagnoses:  Right foot pain     Discharge Instructions      Follow-up with your primary care provider or a podiatrist on Monday.     ED Prescriptions   None    I have reviewed the PDMP during this encounter.   Mickie Bail, NP 05/11/23 [phone]

## 2023-05-14 ENCOUNTER — Ambulatory Visit: Payer: 59 | Admitting: Dietician

## 2023-05-17 ENCOUNTER — Telehealth: Payer: Self-pay

## 2023-05-17 NOTE — Telephone Encounter (Signed)
Per [facility] verbal patient labs look good!

## 2023-05-19 ENCOUNTER — Other Ambulatory Visit: Payer: Self-pay | Admitting: Cardiovascular Disease

## 2023-05-19 DIAGNOSIS — I63511 Cerebral infarction due to unspecified occlusion or stenosis of right middle cerebral artery: Secondary | ICD-10-CM

## 2023-05-19 DIAGNOSIS — I10 Essential (primary) hypertension: Secondary | ICD-10-CM

## 2023-05-19 DIAGNOSIS — I6523 Occlusion and stenosis of bilateral carotid arteries: Secondary | ICD-10-CM

## 2023-05-19 DIAGNOSIS — R739 Hyperglycemia, unspecified: Secondary | ICD-10-CM

## 2023-05-19 DIAGNOSIS — M797 Fibromyalgia: Secondary | ICD-10-CM

## 2023-05-19 DIAGNOSIS — E782 Mixed hyperlipidemia: Secondary | ICD-10-CM

## 2023-05-28 ENCOUNTER — Ambulatory Visit (INDEPENDENT_AMBULATORY_CARE_PROVIDER_SITE_OTHER): Payer: 59 | Admitting: Podiatry

## 2023-05-28 ENCOUNTER — Telehealth: Payer: Self-pay | Admitting: Family

## 2023-05-28 ENCOUNTER — Encounter: Payer: Self-pay | Admitting: Podiatry

## 2023-05-28 ENCOUNTER — Other Ambulatory Visit: Payer: Self-pay | Admitting: Family

## 2023-05-28 ENCOUNTER — Other Ambulatory Visit: Payer: Self-pay

## 2023-05-28 DIAGNOSIS — G5751 Tarsal tunnel syndrome, right lower limb: Secondary | ICD-10-CM

## 2023-05-28 MED ORDER — TRIAMCINOLONE ACETONIDE 40 MG/ML IJ SUSP
20.0000 mg | Freq: Once | INTRAMUSCULAR | Status: AC
Start: 2023-05-28 — End: 2023-05-28
  Administered 2023-05-28: 20 mg

## 2023-05-28 MED FILL — Tirzepatide Soln Auto-injector 15 MG/0.5ML: SUBCUTANEOUS | 28 days supply | Qty: 2 | Fill #0 | Status: CN

## 2023-05-28 NOTE — Progress Notes (Signed)
Subjective:  Patient ID: Terri Wood, female    DOB: 09/04/1966,  MRN: [id number] HPI Chief Complaint  Patient presents with   Foot Pain    Arch/lateral foot right - aching x 1 month, after being on it awhile it goes numb, Urgent Care xrayed (in [facility] 7/26), Rx'd naproxen and a brace to try, brace-no help some in the arch, previous fasciitis and nerve surgery    New Patient (Initial Visit)    Diabetic - last A1c was 6.1    57 y.o. female presents with the above complaint.   ROS: Denies fever chills nausea vomit muscle aches pains calf pain back pain chest pain shortness of breath.  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   Community acquired pneumonia 01/16/2021   Depression    Fibromyalgia    Foraminal stenosis of lumbar region 02/27/2018   GERD (gastroesophageal reflux disease)    Hyperkalemia 09/13/2017   Hyperlipidemia    Hypertension    Lumbar radiculopathy 12/15/2016   Multifocal pneumonia 06/05/2021   Osteoarthritis of left hip 2019   Pneumonia    01/16/2021 and 06/05/2021   Pre-diabetes    Restless leg syndrome 02/02/2015   Sepsis (HCC) 01/16/2021   Stroke (HCC) 08/05/2019   occlusion of right middle cerebral artery   Past Surgical History:  Procedure Laterality Date   ABDOMINAL SURGERY  1996   gastric bypass; stapling; surgilite   GASTRIC BYPASS OPEN  1996   PLANTAR FASCIA SURGERY Right 2009   TARSAL TUNNEL RELEASE  2009   TOTAL HIP ARTHROPLASTY Left 04/09/2018   Procedure: TOTAL HIP ARTHROPLASTY ANTERIOR APPROACH;  Surgeon: Kennedy Bucker, MD;  Location: [facility] ORS;  Wood: Orthopedics;   Laterality: Left;   TOTAL KNEE ARTHROPLASTY Right 07/12/2021   Procedure: TOTAL KNEE ARTHROPLASTY;  Surgeon: Kennedy Bucker, MD;  Location: [facility] ORS;  Wood: Orthopedics;  Laterality: Right;   TOTAL KNEE ARTHROPLASTY Left 02/16/2022   Procedure: TOTAL KNEE ARTHROPLASTY;  Surgeon: Kennedy Bucker, MD;  Location: [facility] ORS;  Wood: Orthopedics;  Laterality: Left;    Current Outpatient Medications:    metoprolol succinate (TOPROL-XL) 25 MG 24 hr tablet, Take 25 mg by mouth daily., Disp: , Rfl:    naproxen (NAPROSYN) 500 MG tablet, PLEASE SEE ATTACHED FOR DETAILED DIRECTIONS, Disp: , Rfl:    ACCU-CHEK GUIDE test strip, USE TO CHECK BLOOD GLUCOSE ONCE DAILY, Disp: 100 strip, Rfl: 1   Ascorbic Acid (VITAMIN C WITH ROSE HIPS) 1000 MG tablet, Take 1,000 mg by mouth daily., Disp: , Rfl:    aspirin 81 MG chewable tablet, Chew 1 tablet (81 mg total) by mouth 2 (two) times daily., Disp: 30 tablet, Rfl: 0   atorvastatin (LIPITOR) 80 MG tablet, Take 80 mg by mouth daily., Disp: , Rfl:    azelastine (ASTELIN) 0.1 % nasal spray, Place 2 sprays into both nostrils 2 (two) times daily. Use in each nostril as directed, Disp: 30 mL, Rfl: 12   B Complex-C (B-COMPLEX WITH VITAMIN C) tablet, Take 1 tablet by mouth daily., Disp: , Rfl:    Biotin 5 MG CAPS, Take 5 mg by mouth daily., Disp: , Rfl:  buPROPion (WELLBUTRIN XL) 150 MG 24 hr tablet, Take 150 mg by mouth daily., Disp: , Rfl:    celecoxib (CELEBREX) 200 MG capsule, TAKE 1 CAPSULE BY MOUTH TWICE A DAY, Disp: 60 capsule, Rfl: 5   Cholecalciferol (VITAMIN D3) 125 MCG (5000 UT) CAPS, Take 5,000 Units by mouth daily., Disp: , Rfl:    cloNIDine (CATAPRES) 0.1 MG tablet, Take 0.1 mg by mouth at bedtime., Disp: , Rfl:    clopidogrel (PLAVIX) 75 MG tablet, Take 75 mg by mouth daily., Disp: , Rfl:    diclofenac Sodium (VOLTAREN) 1 % GEL, Apply 1 application topically 3 (three) times daily as needed (pain)., Disp: , Rfl:    DULoxetine (CYMBALTA) 60 MG capsule, Take 60 mg  by mouth 2 (two) times daily., Disp: , Rfl:    fexofenadine (ALLEGRA) 180 MG tablet, Take 1 tablet (180 mg total) by mouth daily., Disp: 90 tablet, Rfl: 1   gabapentin (NEURONTIN) 600 MG tablet, TAKE 1 TABLET BY MOUTH THREE TIMES A DAY, Disp: 90 tablet, Rfl: 1   GEMTESA 75 MG TABS, Take 1 tablet by mouth daily., Disp: , Rfl:    HYDROcodone-acetaminophen (NORCO) 10-325 MG tablet, Take 1 tablet by mouth every 4 (four) hours as needed., Disp: , Rfl:    lisinopril (ZESTRIL) 10 MG tablet, Take 1 tablet (10 mg total) by mouth daily., Disp: 30 tablet, Rfl: 11   lisinopril-hydrochlorothiazide (ZESTORETIC) 20-25 MG tablet, TAKE 1 TABLET BY MOUTH EVERY DAY, Disp: 90 tablet, Rfl: 1   metoprolol tartrate (LOPRESSOR) 50 MG tablet, Take 1 tablet (50 mg total) by mouth 2 (two) times daily., Disp: 60 tablet, Rfl: 11   tirzepatide (MOUNJARO) 15 MG/0.5ML Pen, Inject 15 mg into the skin once a week., Disp: 2 mL, Rfl: 3   Multiple Vitamins-Minerals (MULTI FOR HER 50+ PO), Take 1 tablet by mouth daily., Disp: , Rfl:    Omega 3 1000 MG CAPS, Take 1,000 mg by mouth daily with breakfast. , Disp: , Rfl:    pantoprazole (PROTONIX) 40 MG tablet, TAKE 1 TABLET BY MOUTH EVERY DAY, Disp: 90 tablet, Rfl: 1   polyethylene glycol (MIRALAX / GLYCOLAX) 17 g packet, Take 17 g by mouth daily as needed for mild constipation., Disp: 14 each, Rfl: 0   Specialty Vitamins Products (MENOPAUSE RELIEF PO), Take 2 tablets by mouth daily. Amberen, Disp: , Rfl:    vortioxetine HBr (TRINTELLIX) 5 MG TABS tablet, Take 5 mg by mouth daily., Disp: , Rfl:   Allergies  Allergen Reactions   Penicillins Hives, Other (See Comments) and Rash    TOLERATED ROCEPHIN AND CEFAZOLIN  Did it involve swelling of the face/tongue/throat, SOB, or low BP? Yes  Did it involve sudden or severe rash/hives, skin peeling, or any reaction on the inside of your mouth or nose? No  Did you need to seek medical attention at a hospital or doctor's office? No  When did  it last happen? Within the past 10 years   If all above answers are "NO", may proceed with cephalosporin use.  Did it involve swelling of the face/tongue/throat, SOB, or low BP? Yes  Did it involve sudden or severe rash/hives, skin peeling, or any reaction on the inside of your mouth or nose? No  Did you need to seek medical attention at a hospital or doctor's office? No  When did it last happen? Within the past 10 years   If all above answers are "NO", may proceed with cephalosporin use.  Has patient had a  PCN reaction causing immediate rash, facial/tongue/throat swelling, SOB or lightheadedness with hypotension: Yes  Has patient had a PCN reaction causing severe rash involving mucus membranes or skin necrosis: No  Has patient had a PCN reaction that required hospitalization: No  Has patient had a PCN reaction occurring within the last 10 years: Yes  If all of the above answers are "NO", then may proceed with Cephalosporin use.   Levofloxacin Other (See Comments)    Dizziness   Oxycodone    Vilazodone Other (See Comments)   Review of Systems Objective:  There were no vitals filed for this visit.  General: Well developed, nourished, in no acute distress, alert and oriented x3   Dermatological: Skin is warm, dry and supple bilateral. Nails x 10 are well maintained; remaining integument appears unremarkable at this time. There are no open sores, no preulcerative lesions, no rash or signs of infection present.  Vascular: Dorsalis Pedis artery and Posterior Tibial artery pedal pulses are 2/4 bilateral with immedate capillary fill time. Pedal hair growth present. No varicosities and no lower extremity edema present bilateral.   Neruologic: Grossly intact via light touch bilateral. Vibratory intact via tuning fork bilateral. Protective threshold with Semmes Wienstein monofilament intact to all pedal sites bilateral. Patellar and Achilles deep tendon reflexes 2+ bilateral. No Babinski or clonus  noted bilateral.  Pain on palpation and vibratory sensation as well as percussion at the distal end of the porta pedis as the nerve exits the tarsal tunnel.  Musculoskeletal: No gross boney pedal deformities bilateral. No pain, crepitus, or limitation noted with foot and ankle range of motion bilateral. Muscular strength 5/5 in all groups tested bilateral.  Gait: Unassisted, Nonantalgic.    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  Plan: Injection to the nerve area that is most painful with 10 mg Kenalog 5 mg of Marcaine.  Discussed appropriate shoe gear.  Follow-up with her in 6 weeks      T. [place], [place]

## 2023-05-28 NOTE — Telephone Encounter (Signed)
Patient called in wondering if she should switch to [facility] since she has reached the maximum dose on the [facility]. I explained to her how Reginal Lutes works similar in the dosing. She would like to get Amanda's opinion on if she should switch or just stay on Mounjaro. If she should just stay on Mounjaro can you please respond to the Rx request for a new Rx for [facility].

## 2023-05-29 ENCOUNTER — Other Ambulatory Visit: Payer: Self-pay

## 2023-05-30 ENCOUNTER — Other Ambulatory Visit: Payer: Self-pay

## 2023-05-30 MED FILL — Tirzepatide Soln Auto-injector 15 MG/0.5ML: SUBCUTANEOUS | 28 days supply | Qty: 2 | Fill #0 | Status: AC

## 2023-06-04 DIAGNOSIS — M7581 Other shoulder lesions, right shoulder: Secondary | ICD-10-CM

## 2023-06-04 DIAGNOSIS — M7521 Bicipital tendinitis, right shoulder: Secondary | ICD-10-CM | POA: Insufficient documentation

## 2023-06-04 DIAGNOSIS — S46011A Strain of muscle(s) and tendon(s) of the rotator cuff of right shoulder, initial encounter: Secondary | ICD-10-CM

## 2023-06-04 HISTORY — DX: Strain of muscle(s) and tendon(s) of the rotator cuff of right shoulder, initial encounter: S46.011A

## 2023-06-04 HISTORY — DX: Bicipital tendinitis, right shoulder: M75.21

## 2023-06-04 HISTORY — DX: Other shoulder lesions, right shoulder: M75.81

## 2023-06-05 ENCOUNTER — Other Ambulatory Visit: Payer: Self-pay

## 2023-06-07 ENCOUNTER — Other Ambulatory Visit: Payer: Self-pay

## 2023-06-07 ENCOUNTER — Other Ambulatory Visit: Payer: Self-pay | Admitting: Family

## 2023-06-08 ENCOUNTER — Other Ambulatory Visit: Payer: Self-pay | Admitting: Family

## 2023-06-08 ENCOUNTER — Ambulatory Visit: Payer: 59 | Admitting: Cardiovascular Disease

## 2023-06-08 ENCOUNTER — Other Ambulatory Visit: Payer: Self-pay

## 2023-06-08 MED ORDER — GABAPENTIN 600 MG PO TABS
600.0000 mg | ORAL_TABLET | Freq: Three times a day (TID) | ORAL | 1 refills | Status: DC
  Filled 2023-06-08: qty 90, 30d supply, fill #0

## 2023-06-11 ENCOUNTER — Other Ambulatory Visit: Payer: Self-pay

## 2023-06-11 ENCOUNTER — Ambulatory Visit (INDEPENDENT_AMBULATORY_CARE_PROVIDER_SITE_OTHER): Payer: 59 | Admitting: Family

## 2023-06-11 VITALS — BP 129/62 | HR 64 | Ht 61.0 in | Wt 225.8 lb

## 2023-06-11 DIAGNOSIS — R5383 Other fatigue: Secondary | ICD-10-CM

## 2023-06-11 DIAGNOSIS — R232 Flushing: Secondary | ICD-10-CM

## 2023-06-11 DIAGNOSIS — G4719 Other hypersomnia: Secondary | ICD-10-CM | POA: Diagnosis not present

## 2023-06-11 MED ORDER — DULOXETINE HCL 60 MG PO CPEP: 60.0000 mg | ORAL_CAPSULE | Freq: Two times a day (BID) | ORAL | 1 refills | Status: DC

## 2023-06-11 MED ORDER — VEOZAH 45 MG PO TABS: 45.0000 mg | ORAL_TABLET | Freq: Every day | ORAL | 1 refills | Status: DC

## 2023-06-11 MED ORDER — DULOXETINE HCL 60 MG PO CPEP
60.0000 mg | ORAL_CAPSULE | Freq: Two times a day (BID) | ORAL | 1 refills | Status: DC
  Filled 2023-06-11: qty 180, 90d supply, fill #0

## 2023-06-11 MED ORDER — METOPROLOL SUCCINATE ER 25 MG PO TB24: 25.0000 mg | ORAL_TABLET | Freq: Every day | ORAL | 1 refills | Status: DC

## 2023-06-11 NOTE — Progress Notes (Signed)
Established Patient Office Visit  Subjective:  Patient ID: Terri Wood, female    DOB: [birth year]  Age: 57 y.o. MRN: [id number]  No chief complaint on file.   Patient here today with a few concerns:   1) She has continued to have significant fatigue. Asks if we might want to redo a sleep study.  2) Also has been having more hot flashes. She says she's been taking [employer].  Trying to take vitamins to help with menopause, but her hot flashes are getting much much worse.    No other concerns at this time.   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   Community acquired pneumonia 01/16/2021   Depression    Fibromyalgia    Foraminal stenosis of lumbar region 02/27/2018   GERD (gastroesophageal reflux disease)    Hyperkalemia 09/13/2017   Hyperlipidemia    Hypertension    Lumbar radiculopathy 12/15/2016   Multifocal pneumonia 06/05/2021   Osteoarthritis of left hip 2019   Pneumonia    01/16/2021 and 06/05/2021   Pre-diabetes    Restless leg syndrome 02/02/2015   Sepsis (HCC) 01/16/2021   Stroke (HCC) 08/05/2019   occlusion of right middle cerebral artery    Past Surgical History:  Procedure Laterality Date   ABDOMINAL SURGERY  1996   gastric bypass; stapling; surgilite   GASTRIC BYPASS OPEN  1996   PLANTAR FASCIA SURGERY Right 2009   TARSAL TUNNEL RELEASE  2009   TOTAL HIP ARTHROPLASTY Left 04/09/2018   Procedure: TOTAL HIP ARTHROPLASTY ANTERIOR APPROACH;  Surgeon: Kennedy Bucker, MD;  Location: [facility] ORS;  Service: Orthopedics;  Laterality: Left;   TOTAL KNEE ARTHROPLASTY Right 07/12/2021    Procedure: TOTAL KNEE ARTHROPLASTY;  Surgeon: Kennedy Bucker, MD;  Location: [facility] ORS;  Service: Orthopedics;  Laterality: Right;   TOTAL KNEE ARTHROPLASTY Left 02/16/2022   Procedure: TOTAL KNEE ARTHROPLASTY;  Surgeon: Kennedy Bucker, MD;  Location: [facility] ORS;  Service: Orthopedics;  Laterality: Left;    Social History   Socioeconomic History   Marital status: Legally Separated    Spouse name: Not on file   Number of children: 0   Years of education: Not on file   Highest education level: Not on file  Occupational History   Not on file  Tobacco Use   Smoking status: Former    Current packs/day: 0.00    Types: Cigarettes    Quit date: 07/2019    Years since quitting: 3.9   Smokeless tobacco: Never  Vaping Use   Vaping status: Never Used  Substance and Sexual Activity   Alcohol use: Not Currently   Drug use: Not Currently    Types: Marijuana    Comment: none since 2019   Sexual activity: Not Currently  Other Topics Concern   Not on file  Social History Narrative   Lives with mother   Social Determinants of Health   Financial Resource Strain: Low Risk  (01/16/2022)   Received from [facility], [facility]   Overall Financial Resource Strain (CARDIA)    Difficulty of Paying Living Expenses: Not very hard  Food Insecurity:  Unknown (01/16/2022)   Received from [facility], [facility]   Hunger Vital Sign    Worried About Running Out of Food in the Last Year: Never true    Ran Out of Food in the Last Year: Not on file  Transportation Needs: No Transportation Needs (01/16/2022)   Received from [facility], [facility]   [facility] - Transportation    In the past 12 months, has lack of transportation kept you from medical appointments or from getting medications?: No    Lack of Transportation (Non-Medical): No  Physical Activity: Inactive (01/16/2022)   Received from [facility], [facility]   Exercise Vital Sign    Days of Exercise per Week: 0 days    Minutes of Exercise per Session: 0 min  Stress: No Stress Concern Present (01/16/2022)   Received from [facility], [facility]   [employer] of Occupational Health - Occupational Stress Questionnaire    Feeling of Stress : Not at all  Social Connections: Moderately Integrated (01/16/2022)   Received from [facility], [facility]   Social Connection and Isolation Panel [NHANES]    Frequency of Communication with Friends and Family: More than three times a week    Frequency of Social Gatherings with Friends and Family: Never    Attends Religious Services: More than 4 times per year    Active Member of [employer] or Organizations: Yes    Attends Engineer, structural: More than 4 times per year    Marital Status: Separated  Intimate Partner Violence: Not on file    Family History  Problem Relation Age of Onset   Aneurysm Mother    Heart disease Father     Allergies  Allergen Reactions   Penicillins Hives, Other (See Comments) and Rash    TOLERATED ROCEPHIN AND CEFAZOLIN  Did it involve swelling of the face/tongue/throat, SOB, or low BP? Yes  Did it involve sudden or severe rash/hives, skin peeling, or any reaction on the inside of your mouth or nose? No  Did you need to seek medical attention at a hospital or doctor's office? No  When did it last happen? Within the past 10 years   If all above answers are "NO", may proceed with cephalosporin use.  Did it involve swelling of the face/tongue/throat, SOB, or low BP? Yes  Did it involve sudden or severe rash/hives, skin peeling, or any reaction on the inside of your mouth or nose? No  Did you need to seek medical attention at a hospital or doctor's office? No  When did it last happen? Within the past 10 years   If all above answers are "NO", may  proceed with cephalosporin use.  Has patient had a PCN reaction causing immediate rash, facial/tongue/throat swelling, SOB or lightheadedness with hypotension: Yes  Has patient had a PCN reaction causing severe rash involving mucus membranes or skin necrosis: No  Has patient had a PCN reaction that required hospitalization: No  Has patient had a PCN reaction occurring within the last 10 years: Yes  If all of the above answers are "NO", then may proceed with Cephalosporin use.   Levofloxacin Other (See Comments)    Dizziness   Oxycodone    Vilazodone Other (See Comments)    Review of Systems  Constitutional:  Positive for diaphoresis and malaise/fatigue.  Cardiovascular:  Palpitations: hot flashes.  All other  systems reviewed and are negative.      Objective:   BP 129/62   Pulse 64   Ht 5\' 1"  (1.549 m)   Wt 225 lb 12.8 oz (102.4 kg)   LMP 04/01/2018 (Exact Date)   SpO2 100%   BMI 42.66 kg/m   Vitals:   06/11/23 1036  BP: 129/62  Pulse: 64  Height: 5\' 1"  (1.549 m)  Weight: 225 lb 12.8 oz (102.4 kg)  SpO2: 100%  BMI (Calculated): 42.69    Physical Exam Vitals and nursing note reviewed.  Constitutional:      Appearance: Normal appearance. She is obese.  HENT:     Head: Normocephalic.  Eyes:     Extraocular Movements: Extraocular movements intact.     Conjunctiva/sclera: Conjunctivae normal.     Pupils: Pupils are equal, round, and reactive to light.  Cardiovascular:     Rate and Rhythm: Normal rate.  Pulmonary:     Effort: Pulmonary effort is normal.  Neurological:     General: No focal deficit present.     Mental Status: She is alert and oriented to person, place, and time. Mental status is at baseline.  Psychiatric:        Mood and Affect: Mood normal.        Behavior: Behavior normal.        Thought Content: Thought content normal.        Judgment: Judgment normal.      Results for orders placed or performed in visit on 06/11/23  Iron, TIBC and  Ferritin Panel  Result Value Ref Range   Total Iron Binding Capacity 310 250 - 450 ug/dL   UIBC 161 096 - 045 ug/dL   Iron 80 27 - 409 ug/dL   Iron Saturation 26 15 - 55 %   Ferritin 91 15 - 150 ng/mL    Recent Results (from the past 2160 hour(s))  Basic metabolic panel     Status: None   Collection Time: 03/28/23  2:27 PM  Result Value Ref Range   Glucose 78 70 - 99 mg/dL   BUN 16 6 - 24 mg/dL   Creatinine, Ser 8.11 0.57 - 1.00 mg/dL   eGFR 84 >91 YN/WGN/5.62   BUN/Creatinine Ratio 20 9 - 23   Sodium 141 134 - 144 mmol/L   Potassium 5.1 3.5 - 5.2 mmol/L   Chloride 100 96 - 106 mmol/L   CO2 28 20 - 29 mmol/L   Calcium 9.9 8.7 - 10.2 mg/dL  Vitamin Z30     Status: None   Collection Time: 04/27/23 10:01 AM  Result Value Ref Range   Vitamin B-12 571 232 - 1,245 pg/mL  Hemoglobin A1c     Status: Abnormal   Collection Time: 04/27/23 10:01 AM  Result Value Ref Range   Hgb A1c MFr Bld 6.0 (H) 4.8 - 5.6 %    Comment:          Prediabetes: 5.7 - 6.4          Diabetes: >6.4          Glycemic control for adults with diabetes: <7.0    Est. average glucose Bld gHb Est-mCnc 126 mg/dL  [id number]     Status: Abnormal   Collection Time: 04/27/23 10:01 AM  Result Value Ref Range   Glucose 78 70 - 99 mg/dL   BUN 17 6 - 24 mg/dL   Creatinine, Ser 2.95 0.57 - 1.00 mg/dL   eGFR 76 >28 UX/LKG/4.01  BUN/Creatinine Ratio 19 9 - 23   Sodium 137 134 - 144 mmol/L   Potassium 4.8 3.5 - 5.2 mmol/L   Chloride 98 96 - 106 mmol/L   CO2 24 20 - 29 mmol/L   Calcium 9.4 8.7 - 10.2 mg/dL   Total Protein 6.8 6.0 - 8.5 g/dL   Albumin 4.3 3.8 - 4.9 g/dL   Globulin, Total 2.5 1.5 - 4.5 g/dL   Bilirubin Total 0.4 0.0 - 1.2 mg/dL   Alkaline Phosphatase 126 (H) 44 - 121 IU/L   AST 21 0 - 40 IU/L   ALT 24 0 - 32 IU/L  CBC With Differential     Status: None   Collection Time: 04/27/23 10:01 AM  Result Value Ref Range   WBC 6.2 3.4 - 10.8 x10E3/uL   RBC 4.42 3.77 - 5.28 x10E6/uL   Hemoglobin 13.2  11.1 - 15.9 g/dL   Hematocrit 06.2 37.6 - 46.6 %   MCV 94 79 - 97 fL   MCH 29.9 26.6 - 33.0 pg   MCHC 31.9 31.5 - 35.7 g/dL   RDW 28.3 15.1 - 76.1 %   Neutrophils 52 Not Estab. %   Lymphs 35 Not Estab. %   Monocytes 10 Not Estab. %   Eos 2 Not Estab. %   Basos 1 Not Estab. %   Neutrophils Absolute 3.2 1.4 - 7.0 x10E3/uL   Lymphocytes Absolute 2.2 0.7 - 3.1 x10E3/uL   Monocytes Absolute 0.6 0.1 - 0.9 x10E3/uL   EOS (ABSOLUTE) 0.1 0.0 - 0.4 x10E3/uL   Basophils Absolute 0.0 0.0 - 0.2 x10E3/uL   Immature Granulocytes 0 Not Estab. %   Immature Grans (Abs) 0.0 0.0 - 0.1 x10E3/uL    Comment: **Effective May 14, 2023, profile [id number] CBC/Differential**   (No Platelet) will be made non-orderable. [facility] Offers:   [id number] CBC With Differential/Platelet   VITAMIN D 25 Hydroxy (Vit-D Deficiency, Fractures)     Status: None   Collection Time: 04/27/23 10:01 AM  Result Value Ref Range   Vit D, 25-Hydroxy 44.7 30.0 - 100.0 ng/mL    Comment: Vitamin D deficiency has been defined by the Institute of Medicine and an Endocrine Society practice guideline as a level of serum 25-OH vitamin D less than 20 ng/mL (1,2). The Endocrine Society went on to further define vitamin D insufficiency as a level between 21 and 29 ng/mL (2). 1. IOM (Institute of Medicine). 2010. Dietary reference    intakes for calcium and D. [place]: The    [employer]. 2. Holick MF, Binkley Calypso, Bischoff-Ferrari HA, et al.    Evaluation, treatment, and prevention of vitamin D    deficiency: an Endocrine Society clinical practice    guideline. JCEM. 2011 Jul; 96(7):1911-30.   Lipid panel     Status: None   Collection Time: 04/27/23 10:01 AM  Result Value Ref Range   Cholesterol, Total 112 100 - 199 mg/dL   Triglycerides 79 0 - 149 mg/dL   HDL 55 >06 mg/dL   VLDL Cholesterol Cal 16 5 - 40 mg/dL   LDL Chol Calc (NIH) 41 0 - 99 mg/dL   Chol/HDL Ratio 2.0 0.0 - 4.4 ratio    Comment:                                    T. Chol/HDL Ratio  Men  Women                               1/2 Avg.Risk  3.4    3.3                                   Avg.Risk  5.0    4.4                                2X Avg.Risk  9.6    7.1                                3X Avg.Risk 23.4   11.0   Iron, TIBC and Ferritin Panel     Status: None   Collection Time: 06/20/23  9:40 AM  Result Value Ref Range   Total Iron Binding Capacity 310 250 - 450 ug/dL   UIBC 161 096 - 045 ug/dL   Iron 80 27 - 409 ug/dL   Iron Saturation 26 15 - 55 %   Ferritin 91 15 - 150 ng/mL       Assessment & Plan:   Problem List Items Addressed This Visit   None Visit Diagnoses     Other fatigue    -  Primary   Checking Iron labs today.  Will call with results.   Relevant Orders   Iron, TIBC and Ferritin Panel (Completed)   Excessive daytime sleepiness       Ordering sleep study for pt.  She will let me know if she does not hear from them.   Hot flashes       Patient given [place] samples.  She will let me know if this works for her.  If so, I will send an RX for her.   Relevant Medications   metoprolol succinate (TOPROL-XL) 25 MG 24 hr tablet       Return in about 2 weeks (around 06/25/2023) for F/U.   Total time spent: 20 minutes  Miki Kins, FNP  06/11/2023   This document may have been prepared by [facility] Voice Recognition software and as such may include unintentional dictation errors.

## 2023-06-12 ENCOUNTER — Other Ambulatory Visit: Payer: Self-pay

## 2023-06-20 ENCOUNTER — Other Ambulatory Visit: Payer: 59

## 2023-06-21 ENCOUNTER — Encounter: Payer: Self-pay | Admitting: Cardiovascular Disease

## 2023-06-21 ENCOUNTER — Ambulatory Visit (INDEPENDENT_AMBULATORY_CARE_PROVIDER_SITE_OTHER): Payer: 59 | Admitting: Cardiovascular Disease

## 2023-06-21 VITALS — BP 131/61 | HR 70 | Ht 61.0 in | Wt 226.6 lb

## 2023-06-21 DIAGNOSIS — I679 Cerebrovascular disease, unspecified: Secondary | ICD-10-CM

## 2023-06-21 DIAGNOSIS — I6523 Occlusion and stenosis of bilateral carotid arteries: Secondary | ICD-10-CM

## 2023-06-21 DIAGNOSIS — E782 Mixed hyperlipidemia: Secondary | ICD-10-CM

## 2023-06-21 DIAGNOSIS — I63511 Cerebral infarction due to unspecified occlusion or stenosis of right middle cerebral artery: Secondary | ICD-10-CM

## 2023-06-21 DIAGNOSIS — I10 Essential (primary) hypertension: Secondary | ICD-10-CM | POA: Diagnosis not present

## 2023-06-21 DIAGNOSIS — Z8679 Personal history of other diseases of the circulatory system: Secondary | ICD-10-CM

## 2023-06-21 LAB — IRON,TIBC AND FERRITIN PANEL
Ferritin: 91 ng/mL (ref 15–150)
Iron Saturation: 26 % (ref 15–55)
Iron: 80 ug/dL (ref 27–159)
Total Iron Binding Capacity: 310 ug/dL (ref 250–450)
UIBC: 230 ug/dL (ref 131–425)

## 2023-06-21 MED ORDER — APIXABAN 5 MG PO TABS: 5.0000 mg | ORAL_TABLET | Freq: Two times a day (BID) | ORAL | 0 refills | Status: DC

## 2023-06-21 MED ORDER — AMIODARONE HCL 200 MG PO TABS
ORAL_TABLET | ORAL | 0 refills | Status: AC
Start: 2023-06-21 — End: ?

## 2023-06-21 NOTE — Progress Notes (Signed)
Cardiology Office Note   Date:  06/21/2023   ID:  Terri Wood, DOB 09/14/1966, MRN [id number]  PCP:  Miki Kins, FNP  Cardiologist:  Adrian Blackwater, MD      History of Present Illness: Terri Wood is a 57 y.o. female who presents for  Chief Complaint  Patient presents with   Follow-up    HPI    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   Community acquired pneumonia 01/16/2021   Depression    Fibromyalgia    Foraminal stenosis of lumbar region 02/27/2018   GERD (gastroesophageal reflux disease)    Hyperkalemia 09/13/2017   Hyperlipidemia    Hypertension    Lumbar radiculopathy 12/15/2016   Multifocal pneumonia 06/05/2021   Osteoarthritis of left hip 2019   Pneumonia    01/16/2021 and 06/05/2021   Pre-diabetes    Restless leg syndrome 02/02/2015   Sepsis (HCC) 01/16/2021   Stroke (HCC) 08/05/2019   occlusion of right middle cerebral artery     Past Surgical History:  Procedure Laterality Date   ABDOMINAL SURGERY  1996   gastric bypass; stapling; surgilite   GASTRIC BYPASS OPEN  1996   PLANTAR FASCIA SURGERY Right 2009   TARSAL TUNNEL RELEASE  2009   TOTAL HIP ARTHROPLASTY Left 04/09/2018   Procedure: TOTAL HIP ARTHROPLASTY ANTERIOR APPROACH;  Surgeon: Kennedy Bucker, MD;  Location: [facility];  Service: Orthopedics;  Laterality: Left;   TOTAL KNEE ARTHROPLASTY Right 07/12/2021   Procedure: TOTAL KNEE ARTHROPLASTY;  Surgeon: Kennedy Bucker, MD;  Location: [facility];  Service: Orthopedics;  Laterality: Right;   TOTAL KNEE ARTHROPLASTY Left 02/16/2022   Procedure: TOTAL KNEE ARTHROPLASTY;   Surgeon: Kennedy Bucker, MD;  Location: [facility];  Service: Orthopedics;  Laterality: Left;     Current Outpatient Medications  Medication Sig Dispense Refill   amiodarone (PACERONE) 200 MG tablet Take 4 tablets/day for 1 week, then 3 tablets/day for 1 week, then 2 tablets/day for 1 week. Then follow up. 70 tablet 0   apixaban (ELIQUIS) 5 MG TABS tablet Take 1 tablet (5 mg total) by mouth 2 (two) times daily. 60 tablet 0   ACCU-CHEK GUIDE test strip USE TO CHECK BLOOD GLUCOSE ONCE DAILY 100 strip 1   Ascorbic Acid (VITAMIN C WITH ROSE HIPS) 1000 MG tablet Take 1,000 mg by mouth daily.     atorvastatin (LIPITOR) 80 MG tablet Take 80 mg by mouth daily.     azelastine (ASTELIN) 0.1 % nasal spray Place 2 sprays into both nostrils 2 (two) times daily. Use in each nostril as directed 30 mL 12   B Complex-C (B-COMPLEX WITH VITAMIN C) tablet Take 1 tablet by mouth daily.     Biotin 5 MG CAPS Take 5 mg by mouth daily.     Cholecalciferol (VITAMIN D3) 125 MCG (5000 UT) CAPS Take 5,000 Units by mouth daily.     cloNIDine (CATAPRES) 0.1 MG tablet Take 0.1 mg by mouth at bedtime.     clopidogrel (PLAVIX) 75 MG tablet Take 75 mg by mouth daily.     diclofenac Sodium (VOLTAREN) 1 % GEL Apply  1 application topically 3 (three) times daily as needed (pain).     DULoxetine (CYMBALTA) 60 MG capsule Take 1 capsule (60 mg total) by mouth 2 (two) times daily. 180 capsule 1   DULoxetine (CYMBALTA) 60 MG capsule Take 1 capsule (60 mg total) by mouth 2 (two) times daily. 180 capsule 1   fexofenadine (ALLEGRA) 180 MG tablet Take 1 tablet (180 mg total) by mouth daily. 90 tablet 1   Fezolinetant (VEOZAH) 45 MG TABS Take 1 tablet (45 mg total) by mouth daily. 90 tablet 1   gabapentin (NEURONTIN) 600 MG tablet Take 1 tablet (600 mg total) by mouth 3 (three) times daily. 90 tablet 1   GEMTESA 75 MG TABS Take 1 tablet by mouth daily.     HYDROcodone-acetaminophen (NORCO) 10-325 MG tablet Take 1 tablet by mouth every 4 (four)  hours as needed.     lisinopril-hydrochlorothiazide (ZESTORETIC) 20-25 MG tablet TAKE 1 TABLET BY MOUTH EVERY DAY 90 tablet 1   metoprolol succinate (TOPROL-XL) 25 MG 24 hr tablet Take 1 tablet (25 mg total) by mouth daily. 90 tablet 1   Multiple Vitamins-Minerals (MULTI FOR HER 50+ PO) Take 1 tablet by mouth daily.     naproxen (NAPROSYN) 500 MG tablet PLEASE SEE ATTACHED FOR DETAILED DIRECTIONS     Omega 3 1000 MG CAPS Take 1,000 mg by mouth daily with breakfast.      pantoprazole (PROTONIX) 40 MG tablet TAKE 1 TABLET BY MOUTH EVERY DAY 90 tablet 1   polyethylene glycol (MIRALAX / GLYCOLAX) 17 g packet Take 17 g by mouth daily as needed for mild constipation. 14 each 0   Specialty Vitamins Products (MENOPAUSE RELIEF PO) Take 2 tablets by mouth daily. Amberen     tirzepatide [facility]) 15 MG/0.5ML Pen Inject 15 mg into the skin once a week. 2 mL 3   No current facility-administered medications for this visit.    Allergies:   Penicillins, Levofloxacin, Oxycodone, and Vilazodone    Social History:   reports that she quit smoking about 3 years ago. Her smoking use included cigarettes. She has never used smokeless tobacco. She reports that she does not currently use alcohol. She reports that she does not currently use drugs after having used the following drugs: Marijuana.   Family History:  family history includes Aneurysm in her mother; Heart disease in her father.    ROS:     ROS    All other systems are reviewed and negative.    PHYSICAL EXAM: VS:  BP 131/61   Pulse 70   Ht 5\' 1"  (1.549 m)   Wt 226 lb 9.6 oz (102.8 kg)   LMP 04/01/2018 (Exact Date)   SpO2 98%   BMI 42.82 kg/m  , BMI Body mass index is 42.82 kg/m. Last weight:  Wt Readings from Last 3 Encounters:  06/21/23 226 lb 9.6 oz (102.8 kg)  06/11/23 225 lb 12.8 oz (102.4 kg)  04/27/23 231 lb 6.4 oz (105 kg)     Physical Exam    EKG:   Recent Labs: 01/08/2023: TSH 1.100 04/27/2023: ALT 24; BUN 17;  Creatinine, Ser 0.89; Hemoglobin 13.2; Potassium 4.8; Sodium 137    Lipid Panel    Component Value Date/Time   CHOL 112 04/27/2023 1001   TRIG 79 04/27/2023 1001   HDL 55 04/27/2023 1001   CHOLHDL 2.0 04/27/2023 1001   CHOLHDL 3.6 08/16/2019 0516   VLDL 26 08/16/2019 0516   LDLCALC 41 04/27/2023 1001  Other studies Reviewed: Additional studies/ records that were reviewed today include:  Review of the above records demonstrates:       No data to display            ASSESSMENT AND PLAN:    ICD-10-CM   1. Essential hypertension, benign  I10 amiodarone (PACERONE) 200 MG tablet    2. Cerebrovascular disease  I67.9 amiodarone (PACERONE) 200 MG tablet    3. Bilateral carotid artery stenosis  I65.23 amiodarone (PACERONE) 200 MG tablet    4. Acute ischemic right MCA stroke (HCC)  I63.511 amiodarone (PACERONE) 200 MG tablet    5. Mixed hyperlipidemia  E78.2 amiodarone (PACERONE) 200 MG tablet    6. Atrial fibrillation, currently in sinus rhythm  Z86.79 amiodarone (PACERONE) 200 MG tablet   Holter has episodes of afin, not sinus tachycardia 118/min, advise stoping asp, start eliquis and amiodronel. Had 2 CVA., [place]       Problem List Items Addressed This Visit       Cardiovascular and Mediastinum   Acute ischemic right MCA stroke (HCC)   Relevant Medications   apixaban (ELIQUIS) 5 MG TABS tablet   amiodarone (PACERONE) 200 MG tablet   Essential hypertension, benign - Primary   Relevant Medications   apixaban (ELIQUIS) 5 MG TABS tablet   amiodarone (PACERONE) 200 MG tablet   Carotid artery stenosis   Relevant Medications   apixaban (ELIQUIS) 5 MG TABS tablet   amiodarone (PACERONE) 200 MG tablet   Cerebrovascular disease   Relevant Medications   apixaban (ELIQUIS) 5 MG TABS tablet   amiodarone (PACERONE) 200 MG tablet     Other   Hyperlipidemia   Relevant Medications   apixaban (ELIQUIS) 5 MG TABS tablet   amiodarone (PACERONE) 200 MG tablet    Other Visit Diagnoses     Atrial fibrillation, currently in sinus rhythm       Holter has episodes of afin, not sinus tachycardia 118/min, advise stoping asp, start eliquis and amiodronel. Had 2 CVA., high [place]   Relevant Medications   amiodarone (PACERONE) 200 MG tablet          Disposition:   Return in about 4 weeks (around 07/19/2023).    Total time spent: 40 minutes  Signed,  Adrian Blackwater, MD  06/21/2023 9:52 AM    [facility]

## 2023-06-23 ENCOUNTER — Encounter: Payer: Self-pay | Admitting: Family

## 2023-06-25 ENCOUNTER — Ambulatory Visit: Payer: 59 | Admitting: Family

## 2023-06-25 ENCOUNTER — Other Ambulatory Visit: Payer: Self-pay | Admitting: Family

## 2023-06-25 VITALS — BP 130/80 | HR 59 | Ht 61.0 in | Wt 229.6 lb

## 2023-06-25 DIAGNOSIS — E1165 Type 2 diabetes mellitus with hyperglycemia: Secondary | ICD-10-CM | POA: Diagnosis not present

## 2023-06-25 DIAGNOSIS — R232 Flushing: Secondary | ICD-10-CM | POA: Diagnosis not present

## 2023-06-25 DIAGNOSIS — G4719 Other hypersomnia: Secondary | ICD-10-CM

## 2023-06-25 DIAGNOSIS — J441 Chronic obstructive pulmonary disease with (acute) exacerbation: Secondary | ICD-10-CM

## 2023-06-25 DIAGNOSIS — R5383 Other fatigue: Secondary | ICD-10-CM

## 2023-06-25 DIAGNOSIS — F331 Major depressive disorder, recurrent, moderate: Secondary | ICD-10-CM

## 2023-06-25 LAB — POCT CBG (FASTING - GLUCOSE)-MANUAL ENTRY: Glucose Fasting, POC: 96 mg/dL (ref 70–99)

## 2023-06-25 NOTE — Progress Notes (Signed)
Established Patient Office Visit  Subjective:  Patient ID: Terri Wood, female    DOB: 1966-03-20  Age: 57 y.o. MRN: [id number]  Chief Complaint  Patient presents with   Follow-up    2 week f/u    Patient is here today for her 2 week follow up.  She has been feeling fairly well since last appointment.   She does have additional concerns to discuss today.  Can get 100 day supplies Needs PA for the [facility] - it's been helping.   Labs are not due today. She needs refills.   I have reviewed her active problem list, medication list, allergies, notes from last encounter, lab results for her appointment today.    No other concerns at this time.   Past Medical History:  Diagnosis Date   Acute ischemic right MCA stroke (HCC) 08/15/2019   Acute respiratory failure (HCC) 01/16/2021   AKI (acute kidney injury) (HCC) 06/05/2021   Anxiety    Avascular necrosis of left femoral head (HCC) 04/09/2018   Carotid artery stenosis 08/05/2019   right   Cerebrovascular accident (CVA) due to occlusion of right middle cerebral artery (HCC) 05/03/2020   Chronic midline low back pain with right-sided sciatica 02/11/2016   Community acquired pneumonia 01/16/2021   Depression    Fibromyalgia    Foraminal stenosis of lumbar region 02/27/2018   GERD (gastroesophageal reflux disease)    Hyperkalemia 09/13/2017   Hyperlipidemia    Hypertension    Lumbar radiculopathy 12/15/2016   Multifocal pneumonia 06/05/2021   Osteoarthritis of left hip 2019   Pneumonia    01/16/2021 and 06/05/2021   Pre-diabetes    Restless leg syndrome 02/02/2015   Sepsis (HCC) 01/16/2021   Stroke (HCC) 08/05/2019   occlusion of right middle cerebral artery    Past Surgical History:  Procedure Laterality Date   ABDOMINAL SURGERY  1996   gastric bypass; stapling; surgilite   GASTRIC BYPASS OPEN  1996   PLANTAR FASCIA SURGERY Right 2009   TARSAL TUNNEL RELEASE  2009   TOTAL HIP ARTHROPLASTY Left 04/09/2018    Procedure: TOTAL HIP ARTHROPLASTY ANTERIOR APPROACH;  Surgeon: Kennedy Bucker, MD;  Location: [facility];  Service: Orthopedics;  Laterality: Left;   TOTAL KNEE ARTHROPLASTY Right 07/12/2021   Procedure: TOTAL KNEE ARTHROPLASTY;  Surgeon: Kennedy Bucker, MD;  Location: [facility];  Service: Orthopedics;  Laterality: Right;   TOTAL KNEE ARTHROPLASTY Left 02/16/2022   Procedure: TOTAL KNEE ARTHROPLASTY;  Surgeon: Kennedy Bucker, MD;  Location: [facility];  Service: Orthopedics;  Laterality: Left;    Social History   Socioeconomic History   Marital status: Legally Separated    Spouse name: Not on file   Number of children: 0   Years of education: Not on file   Highest education level: Not on file  Occupational History   Not on file  Tobacco Use   Smoking status: Former    Current packs/day: 0.00    Types: Cigarettes    Quit date: 07/2019    Years since quitting: 3.9   Smokeless tobacco: Never  Vaping Use   Vaping status: Never Used  Substance and Sexual Activity   Alcohol use: Not Currently   Drug use: Not Currently    Types: Marijuana    Comment: none since 2019   Sexual activity: Not Currently  Other Topics Concern   Not on file  Social History Narrative   Lives with mother   Social Determinants of Health   Financial Resource Strain: Low  Risk  (01/16/2022)   Received from [facility], [facility]   Overall Financial Resource Strain (CARDIA)    Difficulty of Paying Living Expenses: Not very hard  Food Insecurity: Unknown (01/16/2022)   Received from [facility], [facility]   Hunger Vital Sign    Worried About Running Out of Food in the Last Year: Never true    Ran Out of Food in the Last Year: Not on file  Transportation Needs: No Transportation Needs (01/16/2022)   Received from [facility], [facility]   [facility] - Transportation    In the past 12 months, has lack of  transportation kept you from medical appointments or from getting medications?: No    Lack of Transportation (Non-Medical): No  Physical Activity: Inactive (01/16/2022)   Received from [facility], [facility]   Exercise Vital Sign    Days of Exercise per Week: 0 days    Minutes of Exercise per Session: 0 min  Stress: No Stress Concern Present (01/16/2022)   Received from [facility], [facility]   [employer] of Occupational Health - Occupational Stress Questionnaire    Feeling of Stress : Not at all  Social Connections: Moderately Integrated (01/16/2022)   Received from [facility], [facility]   Social Connection and Isolation Panel [NHANES]    Frequency of Communication with Friends and Family: More than three times a week    Frequency of Social Gatherings with Friends and Family: Never    Attends Religious Services: More than 4 times per year    Active Member of [employer] or Organizations: Yes    Attends Engineer, structural: More than 4 times per year    Marital Status: Separated  Intimate Partner Violence: Not on file    Family History  Problem Relation Age of Onset   Aneurysm Mother    Heart disease Father     Allergies  Allergen Reactions   Penicillins Hives, Other (See Comments) and Rash    TOLERATED ROCEPHIN AND CEFAZOLIN  Did it involve swelling of the face/tongue/throat, SOB, or low BP? Yes  Did it involve sudden or severe rash/hives, skin peeling, or any reaction on the inside of your mouth or nose? No  Did you need to seek medical attention at a hospital or doctor's office? No  When did it last happen? Within the past 10 years   If all above answers are "NO", may proceed with cephalosporin use.  Did it involve swelling of the face/tongue/throat, SOB, or low BP? Yes  Did it involve sudden or severe rash/hives, skin peeling, or any reaction on the  inside of your mouth or nose? No  Did you need to seek medical attention at a hospital or doctor's office? No  When did it last happen? Within the past 10 years   If all above answers are "NO", may proceed with cephalosporin use.  Has patient had a PCN reaction causing immediate rash, facial/tongue/throat swelling, SOB or lightheadedness with hypotension: Yes  Has patient had a PCN reaction causing severe rash involving mucus membranes or skin necrosis: No  Has patient had a PCN reaction that required hospitalization: No  Has patient had a PCN reaction occurring within the last 10 years: Yes  If all of the above answers are "NO", then may proceed with Cephalosporin use.   Levofloxacin Other (See Comments)  Dizziness   Oxycodone    Vilazodone Other (See Comments)    Review of Systems  Constitutional:  Positive for malaise/fatigue.  All other systems reviewed and are negative.      Objective:   BP 130/80   Pulse (!) 59   Ht 5\' 1"  (1.549 m)   Wt 229 lb 9.6 oz (104.1 kg)   LMP 04/01/2018 (Exact Date)   SpO2 93%   BMI 43.38 kg/m   Vitals:   06/25/23 1134  BP: 130/80  Pulse: (!) 59  Height: 5\' 1"  (1.549 m)  Weight: 229 lb 9.6 oz (104.1 kg)  SpO2: 93%  BMI (Calculated): 43.4    Physical Exam Vitals and nursing note reviewed.  Constitutional:      Appearance: Normal appearance. She is normal weight.  HENT:     Head: Normocephalic.  Eyes:     Pupils: Pupils are equal, round, and reactive to light.  Cardiovascular:     Rate and Rhythm: Normal rate.  Pulmonary:     Effort: Pulmonary effort is normal.  Neurological:     Mental Status: She is alert.      Results for orders placed or performed in visit on 06/25/23  POCT CBG (Fasting - Glucose)  Result Value Ref Range   Glucose Fasting, POC 96 70 - 99 mg/dL    Recent Results (from the past 2160 hour(s))  Vitamin B12     Status: None   Collection Time: 04/27/23 10:01 AM  Result Value Ref Range   Vitamin B-12  571 232 - 1,245 pg/mL  Hemoglobin A1c     Status: Abnormal   Collection Time: 04/27/23 10:01 AM  Result Value Ref Range   Hgb A1c MFr Bld 6.0 (H) 4.8 - 5.6 %    Comment:          Prediabetes: 5.7 - 6.4          Diabetes: >6.4          Glycemic control for adults with diabetes: <7.0    Est. average glucose Bld gHb Est-mCnc 126 mg/dL  [id number]     Status: Abnormal   Collection Time: 04/27/23 10:01 AM  Result Value Ref Range   Glucose 78 70 - 99 mg/dL   BUN 17 6 - 24 mg/dL   Creatinine, Ser 8.29 0.57 - 1.00 mg/dL   eGFR 76 >56 OZ/HYQ/6.57   BUN/Creatinine Ratio 19 9 - 23   Sodium 137 134 - 144 mmol/L   Potassium 4.8 3.5 - 5.2 mmol/L   Chloride 98 96 - 106 mmol/L   CO2 24 20 - 29 mmol/L   Calcium 9.4 8.7 - 10.2 mg/dL   Total Protein 6.8 6.0 - 8.5 g/dL   Albumin 4.3 3.8 - 4.9 g/dL   Globulin, Total 2.5 1.5 - 4.5 g/dL   Bilirubin Total 0.4 0.0 - 1.2 mg/dL   Alkaline Phosphatase 126 (H) 44 - 121 IU/L   AST 21 0 - 40 IU/L   ALT 24 0 - 32 IU/L  CBC With Differential     Status: None   Collection Time: 04/27/23 10:01 AM  Result Value Ref Range   WBC 6.2 3.4 - 10.8 x10E3/uL   RBC 4.42 3.77 - 5.28 x10E6/uL   Hemoglobin 13.2 11.1 - 15.9 g/dL   Hematocrit 84.6 96.2 - 46.6 %   MCV 94 79 - 97 fL   MCH 29.9 26.6 - 33.0 pg   MCHC 31.9 31.5 - 35.7 g/dL   RDW 95.2 84.1 -  15.4 %   Neutrophils 52 Not Estab. %   Lymphs 35 Not Estab. %   Monocytes 10 Not Estab. %   Eos 2 Not Estab. %   Basos 1 Not Estab. %   Neutrophils Absolute 3.2 1.4 - 7.0 x10E3/uL   Lymphocytes Absolute 2.2 0.7 - 3.1 x10E3/uL   Monocytes Absolute 0.6 0.1 - 0.9 x10E3/uL   EOS (ABSOLUTE) 0.1 0.0 - 0.4 x10E3/uL   Basophils Absolute 0.0 0.0 - 0.2 x10E3/uL   Immature Granulocytes 0 Not Estab. %   Immature Grans (Abs) 0.0 0.0 - 0.1 x10E3/uL    Comment: **Effective May 14, 2023, profile [id number] CBC/Differential**   (No Platelet) will be made non-orderable. [facility] Offers:   [id number] CBC With Differential/Platelet    VITAMIN D 25 Hydroxy (Vit-D Deficiency, Fractures)     Status: None   Collection Time: 04/27/23 10:01 AM  Result Value Ref Range   Vit D, 25-Hydroxy 44.7 30.0 - 100.0 ng/mL    Comment: Vitamin D deficiency has been defined by the Institute of Medicine and an Endocrine Society practice guideline as a level of serum 25-OH vitamin D less than 20 ng/mL (1,2). The Endocrine Society went on to further define vitamin D insufficiency as a level between 21 and 29 ng/mL (2). 1. IOM (Institute of Medicine). 2010. Dietary reference    intakes for calcium and D. [place]: The    [employer]. 2. Holick MF, Binkley [place], Bischoff-Ferrari HA, et al.    Evaluation, treatment, and prevention of vitamin D    deficiency: an Endocrine Society clinical practice    guideline. JCEM. 2011 Jul; 96(7):1911-30.   Lipid panel     Status: None   Collection Time: 04/27/23 10:01 AM  Result Value Ref Range   Cholesterol, Total 112 100 - 199 mg/dL   Triglycerides 79 0 - 149 mg/dL   HDL 55 >40 mg/dL   VLDL Cholesterol Cal 16 5 - 40 mg/dL   LDL Chol Calc (NIH) 41 0 - 99 mg/dL   Chol/HDL Ratio 2.0 0.0 - 4.4 ratio    Comment:                                   T. Chol/HDL Ratio                                             Men  Women                               1/2 Avg.Risk  3.4    3.3                                   Avg.Risk  5.0    4.4                                2X Avg.Risk  9.6    7.1                                3X Avg.Risk 23.4   11.0  Iron, TIBC and Ferritin Panel     Status: None   Collection Time: 06/20/23  9:40 AM  Result Value Ref Range   Total Iron Binding Capacity 310 250 - 450 ug/dL   UIBC 098 119 - 147 ug/dL   Iron 80 27 - 829 ug/dL   Iron Saturation 26 15 - 55 %   Ferritin 91 15 - 150 ng/mL  POCT CBG (Fasting - Glucose)     Status: None   Collection Time: 06/25/23 11:40 AM  Result Value Ref Range   Glucose Fasting, POC 96 70 - 99 mg/dL       Assessment & Plan:    Problem List Items Addressed This Visit       Active Problems   Major depressive disorder, recurrent episode, moderate (HCC)    Patient stable.  Well controlled with current therapy.   Continue current meds.        Type 2 diabetes mellitus with hyperglycemia, without long-term current use of insulin (HCC) - Primary   Relevant Orders   POCT CBG (Fasting - Glucose) (Completed)   COPD with acute exacerbation (HCC)    Patient stable.  Well controlled with current therapy.   Continue current meds.       Other Visit Diagnoses     Other fatigue       Excessive daytime sleepiness       Hot flashes       Allyne Gee has been helping, will try to get this approved through her insurance.  I have given her additional samples to help until we are able to.       Return in about 2 months (around 08/25/2023).   Total time spent: 20 minutes  Miki Kins, FNP  06/25/2023   This document may have been prepared by [facility] Voice Recognition software and as such may include unintentional dictation errors.

## 2023-07-02 ENCOUNTER — Telehealth: Payer: Self-pay | Admitting: Family

## 2023-07-02 ENCOUNTER — Other Ambulatory Visit: Payer: Self-pay

## 2023-07-02 MED ORDER — MOUNJARO 12.5 MG/0.5ML ~~LOC~~ SOAJ
12.5000 mg | SUBCUTANEOUS | 2 refills | Status: DC
  Filled 2023-07-02: qty 2, 28d supply, fill #0

## 2023-07-02 MED FILL — Tirzepatide Soln Auto-injector 15 MG/0.5ML: SUBCUTANEOUS | 28 days supply | Qty: 2 | Fill #0 | Status: CN

## 2023-07-02 NOTE — Telephone Encounter (Signed)
Patient left VM that her insurance will not cover the [place]. Also states that whatever new medication she is on is causing her to break out in hives. I only see that SK sent in Eliquis and amiodarone and those are new - but I don't see anything new from [employer]. Could either of those be causing hives or a rash?

## 2023-07-03 ENCOUNTER — Other Ambulatory Visit: Payer: Self-pay

## 2023-07-03 ENCOUNTER — Telehealth: Payer: Self-pay

## 2023-07-03 MED ORDER — MOUNJARO 15 MG/0.5ML ~~LOC~~ SOAJ
15.0000 mg | SUBCUTANEOUS | 1 refills | Status: DC
  Filled 2023-07-03: qty 2, 28d supply, fill #0
  Filled 2023-07-30: qty 2, 28d supply, fill #1

## 2023-07-03 NOTE — Telephone Encounter (Signed)
Sending to pharmcy per [employer]

## 2023-07-04 ENCOUNTER — Other Ambulatory Visit: Payer: Self-pay

## 2023-07-04 MED ORDER — FLULAVAL 0.5 ML IM SUSY
0.5000 mL | PREFILLED_SYRINGE | Freq: Once | INTRAMUSCULAR | 0 refills | Status: AC
  Filled 2023-07-04: qty 0.5, 1d supply, fill #0

## 2023-07-05 ENCOUNTER — Encounter: Payer: Self-pay | Admitting: Cardiovascular Disease

## 2023-07-05 ENCOUNTER — Ambulatory Visit (INDEPENDENT_AMBULATORY_CARE_PROVIDER_SITE_OTHER): Payer: 59 | Admitting: Cardiovascular Disease

## 2023-07-05 VITALS — BP 118/71 | HR 67 | Ht 61.0 in | Wt 227.0 lb

## 2023-07-05 DIAGNOSIS — I6523 Occlusion and stenosis of bilateral carotid arteries: Secondary | ICD-10-CM

## 2023-07-05 DIAGNOSIS — Z8679 Personal history of other diseases of the circulatory system: Secondary | ICD-10-CM

## 2023-07-05 DIAGNOSIS — I10 Essential (primary) hypertension: Secondary | ICD-10-CM

## 2023-07-05 DIAGNOSIS — E1165 Type 2 diabetes mellitus with hyperglycemia: Secondary | ICD-10-CM

## 2023-07-05 DIAGNOSIS — I63511 Cerebral infarction due to unspecified occlusion or stenosis of right middle cerebral artery: Secondary | ICD-10-CM

## 2023-07-05 DIAGNOSIS — R42 Dizziness and giddiness: Secondary | ICD-10-CM

## 2023-07-05 DIAGNOSIS — E782 Mixed hyperlipidemia: Secondary | ICD-10-CM | POA: Diagnosis not present

## 2023-07-05 MED ORDER — LISINOPRIL-HYDROCHLOROTHIAZIDE 10-12.5 MG PO TABS
1.0000 | ORAL_TABLET | Freq: Every day | ORAL | 11 refills | Status: DC
Start: 2023-07-05 — End: 2023-08-23

## 2023-07-05 MED ORDER — RIVAROXABAN 20 MG PO TABS: 20.0000 mg | ORAL_TABLET | Freq: Every day | ORAL | 3 refills | Status: DC

## 2023-07-05 NOTE — Progress Notes (Signed)
Cardiology Office Note   Date:  07/05/2023   ID:  Terri Wood, DOB 1966-04-09, MRN [id number]  PCP:  Terri Kins, FNP  Cardiologist:  Adrian Blackwater, MD      History of Present Illness: Terri Wood is a 57 y.o. female who presents for  Chief Complaint  Patient presents with  . Follow-up    Stopped Eliquis due to hives reaction    Dizziness This is a chronic problem. The current episode started 1 to 4 weeks ago.  Shortness of Breath This is a chronic problem. The current episode started more than 1 month ago.      Past Medical History:  Diagnosis Date  . Acute respiratory failure (HCC) 01/16/2021  . AKI (acute kidney injury) (HCC) 06/05/2021  . Anxiety   . Avascular necrosis of left femoral head (HCC) 04/09/2018  . Carotid artery stenosis 08/05/2019   right  . Cerebrovascular accident (CVA) due to occlusion of right middle cerebral artery (HCC) 05/03/2020  . Chronic midline low back pain with right-sided sciatica 02/11/2016  . Community acquired pneumonia 01/16/2021  . Depression   . Fibromyalgia   . Foraminal stenosis of lumbar region 02/27/2018  . GERD (gastroesophageal reflux disease)   . Hyperkalemia 09/13/2017  . Hyperlipidemia   . Hypertension   . Lumbar radiculopathy 12/15/2016  . Multifocal pneumonia 06/05/2021  . Osteoarthritis of left hip 2019  . Pneumonia    01/16/2021 and 06/05/2021  . Pre-diabetes   . Restless leg syndrome 02/02/2015  . Sepsis (HCC) 01/16/2021  . Stroke [facility]) 08/05/2019   occlusion of right middle cerebral artery     Past Surgical History:  Procedure Laterality Date  . ABDOMINAL SURGERY  1996   gastric bypass; stapling; surgilite  . GASTRIC BYPASS OPEN  1996  . PLANTAR FASCIA SURGERY Right 2009  . TARSAL TUNNEL RELEASE  2009  . TOTAL HIP ARTHROPLASTY Left 04/09/2018   Procedure: TOTAL HIP ARTHROPLASTY ANTERIOR APPROACH;  Surgeon: Kennedy Bucker, MD;  Location: [facility] ORS;  Service: Orthopedics;  Laterality: Left;   . TOTAL KNEE ARTHROPLASTY Right 07/12/2021   Procedure: TOTAL KNEE ARTHROPLASTY;  Surgeon: Kennedy Bucker, MD;  Location: [facility] ORS;  Service: Orthopedics;  Laterality: Right;  . TOTAL KNEE ARTHROPLASTY Left 02/16/2022   Procedure: TOTAL KNEE ARTHROPLASTY;  Surgeon: Kennedy Bucker, MD;  Location: [facility] ORS;  Service: Orthopedics;  Laterality: Left;     Current Outpatient Medications  Medication Sig Dispense Refill  . lisinopril-hydrochlorothiazide (ZESTORETIC) 10-12.5 MG tablet Take 1 tablet by mouth daily. 30 tablet 11  . rivaroxaban (XARELTO) 20 MG TABS tablet Take 1 tablet (20 mg total) by mouth daily with supper. 30 tablet 3  . ACCU-CHEK GUIDE test strip USE TO CHECK BLOOD GLUCOSE ONCE DAILY 100 strip 1  . amiodarone (PACERONE) 200 MG tablet Take 4 tablets/day for 1 week, then 3 tablets/day for 1 week, then 2 tablets/day for 1 week. Then follow up. 70 tablet 0  . Ascorbic Acid (VITAMIN C WITH ROSE HIPS) 1000 MG tablet Take 1,000 mg by mouth daily.    [name] Kitchen atorvastatin (LIPITOR) 80 MG tablet TAKE 1 TABLET BY MOUTH EVERY DAY FOR CHOLESTEROL 90 tablet 3  . azelastine (ASTELIN) 0.1 % nasal spray Place 2 sprays into both nostrils 2 (two) times daily. Use in each nostril as directed 30 mL 12  . B Complex-C (B-COMPLEX WITH VITAMIN C) tablet Take 1 tablet by mouth daily.    . Biotin 5 MG CAPS Take 5 mg by  mouth daily.    . Cholecalciferol (VITAMIN D3) 125 MCG (5000 UT) CAPS Take 5,000 Units by mouth daily.    . cloNIDine (CATAPRES) 0.1 MG tablet Take 0.1 mg by mouth at bedtime.    . clopidogrel (PLAVIX) 75 MG tablet Take 75 mg by mouth daily.    . diclofenac Sodium (VOLTAREN) 1 % GEL Apply 1 application topically 3 (three) times daily as needed (pain).    . DULoxetine (CYMBALTA) 60 MG capsule Take 1 capsule (60 mg total) by mouth 2 (two) times daily. 180 capsule 1  . DULoxetine (CYMBALTA) 60 MG capsule Take 1 capsule (60 mg total) by mouth 2 (two) times daily. 180 capsule 1  . fexofenadine (ALLEGRA) 180  MG tablet Take 1 tablet (180 mg total) by mouth daily. 90 tablet 1  . Fezolinetant (VEOZAH) 45 MG TABS Take 1 tablet (45 mg total) by mouth daily. 90 tablet 1  . gabapentin (NEURONTIN) 600 MG tablet Take 1 tablet (600 mg total) by mouth 3 (three) times daily. 90 tablet 1  . GEMTESA 75 MG TABS Take 1 tablet by mouth daily.    [name] Kitchen HYDROcodone-acetaminophen (NORCO) 10-325 MG tablet Take 1 tablet by mouth every 4 (four) hours as needed.    . influenza vac split trivalent PF (FLULAVAL) 0.5 ML injection Inject 0.5 mLs into the muscle once for 1 dose. 0.5 mL 0  . metoprolol succinate (TOPROL-XL) 25 MG 24 hr tablet Take 1 tablet (25 mg total) by mouth daily. 90 tablet 1  . Multiple Vitamins-Minerals (MULTI FOR HER 50+ PO) Take 1 tablet by mouth daily.    . naproxen (NAPROSYN) 500 MG tablet PLEASE SEE ATTACHED FOR DETAILED DIRECTIONS    . Omega 3 1000 MG CAPS Take 1,000 mg by mouth daily with breakfast.     . pantoprazole (PROTONIX) 40 MG tablet TAKE 1 TABLET BY MOUTH EVERY DAY 90 tablet 1  . polyethylene glycol (MIRALAX / GLYCOLAX) 17 g packet Take 17 g by mouth daily as needed for mild constipation. 14 each 0  . Specialty Vitamins Products (MENOPAUSE RELIEF PO) Take 2 tablets by mouth daily. Amberen    . tirzepatide (MOUNJARO) 15 MG/0.5ML Pen Inject 15 mg into the skin once a week. 6 mL 1   No current facility-administered medications for this visit.    Allergies:   Penicillins, Levofloxacin, Oxycodone, and Vilazodone    Social History:   reports that she quit smoking about 3 years ago. Her smoking use included cigarettes. She has never used smokeless tobacco. She reports that she does not currently use alcohol. She reports that she does not currently use drugs after having used the following drugs: Marijuana.   Family History:  family history includes Aneurysm in her mother; Heart disease in her father.    ROS:     Review of Systems  Constitutional: Negative.   HENT: Negative.    Eyes:  Negative.   Respiratory:  Positive for shortness of breath.   Gastrointestinal: Negative.   Genitourinary: Negative.   Musculoskeletal: Negative.   Skin: Negative.   Neurological:  Positive for dizziness.  Endo/Heme/Allergies: Negative.   Psychiatric/Behavioral: Negative.    All other systems reviewed and are negative.     All other systems are reviewed and negative.    PHYSICAL EXAM: VS:  BP 118/71   Pulse 67   Ht 5\' 1"  (1.549 m)   Wt 227 lb (103 kg)   LMP 04/01/2018 (Exact Date)   SpO2 94%   BMI 42.89  kg/m  , BMI Body mass index is 42.89 kg/m. Last weight:  Wt Readings from Last 3 Encounters:  07/05/23 227 lb (103 kg)  06/25/23 229 lb 9.6 oz (104.1 kg)  06/21/23 226 lb 9.6 oz (102.8 kg)     Physical Exam Constitutional:      Appearance: Normal appearance.  Cardiovascular:     Rate and Rhythm: Normal rate and regular rhythm.     Heart sounds: Normal heart sounds.  Pulmonary:     Effort: Pulmonary effort is normal.     Breath sounds: Normal breath sounds.  Musculoskeletal:     Right lower leg: No edema.     Left lower leg: No edema.  Neurological:     Mental Status: She is alert.      EKG:  NSR 62/min lateral and inferior old MI Recent Labs: 01/08/2023: TSH 1.100 04/27/2023: ALT 24; BUN 17; Creatinine, Ser 0.89; Hemoglobin 13.2; Potassium 4.8; Sodium 137    Lipid Panel    Component Value Date/Time   CHOL 112 04/27/2023 1001   TRIG 79 04/27/2023 1001   HDL 55 04/27/2023 1001   CHOLHDL 2.0 04/27/2023 1001   CHOLHDL 3.6 08/16/2019 0516   VLDL 26 08/16/2019 0516   LDLCALC 41 04/27/2023 1001      Other studies Reviewed: Additional studies/ records that were reviewed today include:  Review of the above records demonstrates:       No data to display            ASSESSMENT AND PLAN:    ICD-10-CM   1. Essential hypertension, benign  I10 rivaroxaban (XARELTO) 20 MG TABS tablet    lisinopril-hydrochlorothiazide (ZESTORETIC) 10-12.5 MG tablet     2. Bilateral carotid artery stenosis  I65.23 rivaroxaban (XARELTO) 20 MG TABS tablet    lisinopril-hydrochlorothiazide (ZESTORETIC) 10-12.5 MG tablet    3. Atrial fibrillation, currently in sinus rhythm  Z86.79 rivaroxaban (XARELTO) 20 MG TABS tablet    lisinopril-hydrochlorothiazide (ZESTORETIC) 10-12.5 MG tablet   Had hives with Elliquis, and changed to xarelto 20    4. Mixed hyperlipidemia  E78.2 rivaroxaban (XARELTO) 20 MG TABS tablet    lisinopril-hydrochlorothiazide (ZESTORETIC) 10-12.5 MG tablet    5. Acute ischemic right MCA stroke (HCC)  I63.511 rivaroxaban (XARELTO) 20 MG TABS tablet    lisinopril-hydrochlorothiazide (ZESTORETIC) 10-12.5 MG tablet    6. Type 2 diabetes mellitus with hyperglycemia, without long-term current use of insulin (HCC)  E11.65 rivaroxaban (XARELTO) 20 MG TABS tablet    lisinopril-hydrochlorothiazide (ZESTORETIC) 10-12.5 MG tablet    7. Dizziness  R42 rivaroxaban (XARELTO) 20 MG TABS tablet    lisinopril-hydrochlorothiazide (ZESTORETIC) 10-12.5 MG tablet   Dizzy and tired, decrease amio 200 and BP meds, if still symptomatic, do cath       Problem List Items Addressed This Visit       Cardiovascular and Mediastinum   Acute ischemic right MCA stroke (HCC)   Relevant Medications   rivaroxaban (XARELTO) 20 MG TABS tablet   lisinopril-hydrochlorothiazide (ZESTORETIC) 10-12.5 MG tablet   Essential hypertension, benign - Primary   Relevant Medications   rivaroxaban (XARELTO) 20 MG TABS tablet   lisinopril-hydrochlorothiazide (ZESTORETIC) 10-12.5 MG tablet   Carotid artery stenosis   Relevant Medications   rivaroxaban (XARELTO) 20 MG TABS tablet   lisinopril-hydrochlorothiazide (ZESTORETIC) 10-12.5 MG tablet     Endocrine   Type 2 diabetes mellitus with hyperglycemia, without long-term current use of insulin (HCC)   Relevant Medications   rivaroxaban (XARELTO) 20 MG TABS tablet  lisinopril-hydrochlorothiazide (ZESTORETIC) 10-12.5 MG tablet      Other   Hyperlipidemia   Relevant Medications   rivaroxaban (XARELTO) 20 MG TABS tablet   lisinopril-hydrochlorothiazide (ZESTORETIC) 10-12.5 MG tablet   Other Visit Diagnoses     Atrial fibrillation, currently in sinus rhythm       Had hives with Elliquis, and changed to xarelto 20   Relevant Medications   rivaroxaban (XARELTO) 20 MG TABS tablet   lisinopril-hydrochlorothiazide (ZESTORETIC) 10-12.5 MG tablet   Dizziness       Dizzy and tired, decrease amio 200 and BP meds, if still symptomatic, do cath   Relevant Medications   rivaroxaban (XARELTO) 20 MG TABS tablet   lisinopril-hydrochlorothiazide (ZESTORETIC) 10-12.5 MG tablet          Disposition:   Return in about 4 weeks (around 08/02/2023).    Total time spent: 30 minutes  Signed,  Adrian Blackwater, MD  07/05/2023 11:56 AM    [facility]

## 2023-07-09 ENCOUNTER — Ambulatory Visit: Payer: 59 | Admitting: Podiatry

## 2023-07-11 ENCOUNTER — Other Ambulatory Visit: Payer: Self-pay

## 2023-07-12 ENCOUNTER — Other Ambulatory Visit: Payer: Self-pay | Admitting: Family

## 2023-07-12 MED ORDER — GABAPENTIN 600 MG PO TABS: 600.0000 mg | ORAL_TABLET | Freq: Three times a day (TID) | ORAL | 1 refills | Status: DC

## 2023-07-13 ENCOUNTER — Other Ambulatory Visit: Payer: Self-pay | Admitting: Cardiovascular Disease

## 2023-07-13 DIAGNOSIS — I679 Cerebrovascular disease, unspecified: Secondary | ICD-10-CM

## 2023-07-13 DIAGNOSIS — I6523 Occlusion and stenosis of bilateral carotid arteries: Secondary | ICD-10-CM

## 2023-07-13 DIAGNOSIS — I63511 Cerebral infarction due to unspecified occlusion or stenosis of right middle cerebral artery: Secondary | ICD-10-CM

## 2023-07-13 DIAGNOSIS — I10 Essential (primary) hypertension: Secondary | ICD-10-CM

## 2023-07-13 DIAGNOSIS — Z8679 Personal history of other diseases of the circulatory system: Secondary | ICD-10-CM

## 2023-07-13 DIAGNOSIS — E782 Mixed hyperlipidemia: Secondary | ICD-10-CM

## 2023-07-14 ENCOUNTER — Encounter: Payer: Self-pay | Admitting: Family

## 2023-07-14 NOTE — Assessment & Plan Note (Signed)
Patient stable.  Well controlled with current therapy.   Continue current meds.  

## 2023-07-23 ENCOUNTER — Telehealth: Payer: Self-pay | Admitting: Family

## 2023-07-23 NOTE — Telephone Encounter (Signed)
Patient left VM that she wants to move her pain doctor from [facility] to somewhere local to [place].   She also said that the menopause medicine she tried was causing her hives. Just FYI.

## 2023-07-24 NOTE — Telephone Encounter (Signed)
Please ask patient what the name was for the Menopause medication that's causing her to have an outbreak of hives

## 2023-07-25 ENCOUNTER — Ambulatory Visit: Payer: 59 | Admitting: Family

## 2023-07-26 ENCOUNTER — Ambulatory Visit: Payer: 59 | Admitting: Family

## 2023-07-30 ENCOUNTER — Other Ambulatory Visit: Payer: Self-pay

## 2023-08-02 ENCOUNTER — Encounter: Payer: Self-pay | Admitting: Cardiovascular Disease

## 2023-08-02 ENCOUNTER — Ambulatory Visit: Payer: 59 | Admitting: Cardiovascular Disease

## 2023-08-02 VITALS — BP 120/75 | HR 70 | Ht 61.0 in | Wt 232.8 lb

## 2023-08-02 DIAGNOSIS — R002 Palpitations: Secondary | ICD-10-CM

## 2023-08-02 DIAGNOSIS — I679 Cerebrovascular disease, unspecified: Secondary | ICD-10-CM | POA: Diagnosis not present

## 2023-08-02 DIAGNOSIS — Z01810 Encounter for preprocedural cardiovascular examination: Secondary | ICD-10-CM | POA: Diagnosis not present

## 2023-08-02 DIAGNOSIS — E782 Mixed hyperlipidemia: Secondary | ICD-10-CM

## 2023-08-02 DIAGNOSIS — I6523 Occlusion and stenosis of bilateral carotid arteries: Secondary | ICD-10-CM

## 2023-08-02 DIAGNOSIS — E1165 Type 2 diabetes mellitus with hyperglycemia: Secondary | ICD-10-CM

## 2023-08-02 DIAGNOSIS — I10 Essential (primary) hypertension: Secondary | ICD-10-CM | POA: Diagnosis not present

## 2023-08-02 NOTE — Progress Notes (Signed)
Cardiology Office Note   Date:  08/02/2023   ID:  Terri Wood, DOB 1966-06-04, MRN [id number]  PCP:  Miki Kins, FNP  Cardiologist:  Adrian Blackwater, MD      History of Present Illness: Terri Wood is a 57 y.o. female who presents for  Chief Complaint  Patient presents with   Follow-up    4 week    Doing well      Past Medical History:  Diagnosis Date   Acute ischemic right MCA stroke (HCC) 08/15/2019   Acute respiratory failure (HCC) 01/16/2021   AKI (acute kidney injury) (HCC) 06/05/2021   Anxiety    Avascular necrosis of left femoral head (HCC) 04/09/2018   Carotid artery stenosis 08/05/2019   right   Cerebrovascular accident (CVA) due to occlusion of right middle cerebral artery (HCC) 05/03/2020   Chronic midline low back pain with right-sided sciatica 02/11/2016   Community acquired pneumonia 01/16/2021   Depression    Fibromyalgia    Foraminal stenosis of lumbar region 02/27/2018   GERD (gastroesophageal reflux disease)    Hyperkalemia 09/13/2017   Hyperlipidemia    Hypertension    Lumbar radiculopathy 12/15/2016   Multifocal pneumonia 06/05/2021   Osteoarthritis of left hip 2019   Pneumonia    01/16/2021 and 06/05/2021   Pre-diabetes    Restless leg syndrome 02/02/2015   Sepsis (HCC) 01/16/2021   Stroke (HCC) 08/05/2019   occlusion of right middle cerebral artery     Past Surgical History:  Procedure Laterality Date   ABDOMINAL SURGERY  1996   gastric bypass; stapling; surgilite   GASTRIC BYPASS OPEN  1996   PLANTAR FASCIA SURGERY Right 2009   TARSAL TUNNEL RELEASE  2009   TOTAL HIP ARTHROPLASTY Left 04/09/2018   Procedure: TOTAL HIP ARTHROPLASTY ANTERIOR APPROACH;  Surgeon: Kennedy Bucker, MD;  Location: [facility] ORS;  Service: Orthopedics;  Laterality: Left;   TOTAL KNEE ARTHROPLASTY Right 07/12/2021   Procedure: TOTAL KNEE ARTHROPLASTY;  Surgeon: Kennedy Bucker, MD;  Location: [facility] ORS;  Service: Orthopedics;  Laterality: Right;    TOTAL KNEE ARTHROPLASTY Left 02/16/2022   Procedure: TOTAL KNEE ARTHROPLASTY;  Surgeon: Kennedy Bucker, MD;  Location: [facility] ORS;  Service: Orthopedics;  Laterality: Left;     Current Outpatient Medications  Medication Sig Dispense Refill   ACCU-CHEK GUIDE test strip USE TO CHECK BLOOD GLUCOSE ONCE DAILY 100 strip 1   amiodarone (PACERONE) 200 MG tablet TAKE 4 TABLETS/DAY FOR 1 WEEK, THEN 3 TABLETS/DAY FOR 1 WEEK, THEN 2 TABLETS/DAY FOR 1 WEEK. THEN FOLLOW UP. 210 tablet 1   Ascorbic Acid (VITAMIN C WITH ROSE HIPS) 1000 MG tablet Take 1,000 mg by mouth daily.     atorvastatin (LIPITOR) 80 MG tablet TAKE 1 TABLET BY MOUTH EVERY DAY FOR CHOLESTEROL 90 tablet 3   azelastine (ASTELIN) 0.1 % nasal spray Place 2 sprays into both nostrils 2 (two) times daily. Use in each nostril as directed 30 mL 12   B Complex-C (B-COMPLEX WITH VITAMIN C) tablet Take 1 tablet by mouth daily.     Biotin 5 MG CAPS Take 5 mg by mouth daily.     Cholecalciferol (VITAMIN D3) 125 MCG (5000 UT) CAPS Take 5,000 Units by mouth daily.     cloNIDine (CATAPRES) 0.1 MG tablet Take 0.1 mg by mouth at bedtime.     clopidogrel (PLAVIX) 75 MG tablet Take 75 mg by mouth daily.     diclofenac Sodium (VOLTAREN) 1 % GEL Apply 1 application  topically 3 (three) times daily as needed (pain).     DULoxetine (CYMBALTA) 60 MG capsule Take 1 capsule (60 mg total) by mouth 2 (two) times daily. 180 capsule 1   DULoxetine (CYMBALTA) 60 MG capsule Take 1 capsule (60 mg total) by mouth 2 (two) times daily. 180 capsule 1   fexofenadine (ALLEGRA) 180 MG tablet Take 1 tablet (180 mg total) by mouth daily. 90 tablet 1   gabapentin (NEURONTIN) 600 MG tablet Take 1 tablet (600 mg total) by mouth 3 (three) times daily. 270 tablet 1   GEMTESA 75 MG TABS Take 1 tablet by mouth daily.     HYDROcodone-acetaminophen (NORCO) 10-325 MG tablet Take 1 tablet by mouth every 4 (four) hours as needed.     lisinopril-hydrochlorothiazide (ZESTORETIC) 10-12.5 MG tablet  Take 1 tablet by mouth daily. 30 tablet 11   metoprolol succinate (TOPROL-XL) 25 MG 24 hr tablet Take 1 tablet (25 mg total) by mouth daily. 90 tablet 1   Multiple Vitamins-Minerals (MULTI FOR HER 50+ PO) Take 1 tablet by mouth daily.     naproxen (NAPROSYN) 500 MG tablet PLEASE SEE ATTACHED FOR DETAILED DIRECTIONS     Omega 3 1000 MG CAPS Take 1,000 mg by mouth daily with breakfast.      pantoprazole (PROTONIX) 40 MG tablet TAKE 1 TABLET BY MOUTH EVERY DAY 90 tablet 1   polyethylene glycol (MIRALAX / GLYCOLAX) 17 g packet Take 17 g by mouth daily as needed for mild constipation. 14 each 0   rivaroxaban (XARELTO) 20 MG TABS tablet Take 1 tablet (20 mg total) by mouth daily with supper. 30 tablet 3   Specialty Vitamins Products (MENOPAUSE RELIEF PO) Take 2 tablets by mouth daily. Amberen     tirzepatide [facility]) 15 MG/0.5ML Pen Inject 15 mg into the skin once a week. 6 mL 1   No current facility-administered medications for this visit.    Allergies:   Penicillins, Levofloxacin, Oxycodone, and Vilazodone    Social History:   reports that she quit smoking about 4 years ago. Her smoking use included cigarettes. She has never used smokeless tobacco. She reports that she does not currently use alcohol. She reports that she does not currently use drugs after having used the following drugs: Marijuana.   Family History:  family history includes Aneurysm in her mother; Heart disease in her father.    ROS:     Review of Systems  Constitutional: Negative.   HENT: Negative.    Eyes: Negative.   Respiratory: Negative.    Gastrointestinal: Negative.   Genitourinary: Negative.   Musculoskeletal: Negative.   Skin: Negative.   Neurological: Negative.   Endo/Heme/Allergies: Negative.   Psychiatric/Behavioral: Negative.    All other systems reviewed and are negative.     All other systems are reviewed and negative.    PHYSICAL EXAM: VS:  BP 120/75   Pulse 70   Ht 5\' 1"  (1.549 m)   Wt 232  lb 12.8 oz (105.6 kg)   LMP 04/01/2018 (Exact Date)   SpO2 94%   BMI 43.99 kg/m  , BMI Body mass index is 43.99 kg/m. Last weight:  Wt Readings from Last 3 Encounters:  08/02/23 232 lb 12.8 oz (105.6 kg)  07/05/23 227 lb (103 kg)  06/25/23 229 lb 9.6 oz (104.1 kg)     Physical Exam    EKG:   Recent Labs: 01/08/2023: TSH 1.100 04/27/2023: ALT 24; BUN 17; Creatinine, Ser 0.89; Hemoglobin 13.2; Potassium 4.8; Sodium 137  Lipid Panel    Component Value Date/Time   CHOL 112 04/27/2023 1001   TRIG 79 04/27/2023 1001   HDL 55 04/27/2023 1001   CHOLHDL 2.0 04/27/2023 1001   CHOLHDL 3.6 08/16/2019 0516   VLDL 26 08/16/2019 0516   LDLCALC 41 04/27/2023 1001      Other studies Reviewed: Additional studies/ records that were reviewed today include:  Review of the above records demonstrates:       No data to display            ASSESSMENT AND PLAN:    ICD-10-CM   1. Preoperative cardiovascular examination  Z01.810    Right shoulder surgery, advise stop plavix 7 days prior to surgery    2. Essential hypertension, benign  I10     3. Cerebrovascular disease  I67.9     4. Bilateral carotid artery stenosis  I65.23     5. Type 2 diabetes mellitus with hyperglycemia, without long-term current use of insulin (HCC)  E11.65     6. Mixed hyperlipidemia  E78.2     7. Palpitations  R00.2        Problem List Items Addressed This Visit       Cardiovascular and Mediastinum   Essential hypertension, benign   Carotid artery stenosis   Cerebrovascular disease     Endocrine   Type 2 diabetes mellitus with hyperglycemia, without long-term current use of insulin (HCC)     Other   Hyperlipidemia   Palpitations   Other Visit Diagnoses     Preoperative cardiovascular examination    -  Primary   Right shoulder surgery, advise stop plavix 7 days prior to surgery          Disposition:   Return in about 6 weeks (around 09/13/2023).    Total time spent: 30  minutes  Signed,  Adrian Blackwater, MD  08/02/2023 11:39 AM    [facility]

## 2023-08-10 ENCOUNTER — Other Ambulatory Visit: Payer: Self-pay | Admitting: Surgery

## 2023-08-10 ENCOUNTER — Other Ambulatory Visit: Payer: Self-pay | Admitting: Cardiovascular Disease

## 2023-08-10 DIAGNOSIS — I679 Cerebrovascular disease, unspecified: Secondary | ICD-10-CM

## 2023-08-10 DIAGNOSIS — I63511 Cerebral infarction due to unspecified occlusion or stenosis of right middle cerebral artery: Secondary | ICD-10-CM

## 2023-08-10 DIAGNOSIS — Z8679 Personal history of other diseases of the circulatory system: Secondary | ICD-10-CM

## 2023-08-10 DIAGNOSIS — I6523 Occlusion and stenosis of bilateral carotid arteries: Secondary | ICD-10-CM

## 2023-08-10 DIAGNOSIS — I10 Essential (primary) hypertension: Secondary | ICD-10-CM

## 2023-08-10 DIAGNOSIS — E782 Mixed hyperlipidemia: Secondary | ICD-10-CM

## 2023-08-13 ENCOUNTER — Telehealth: Payer: Self-pay | Admitting: Family

## 2023-08-13 NOTE — Telephone Encounter (Signed)
Patient left VM that she is having shoulder surgery on 11/7. She knows that she has to stop her Plavix 7 days prior to surgery but was wanting to know if Xarelto is the other blood thinner that she is on.   Spoke with patient and advised her that Xarelto is her other blood thinner. Patient verbalized understanding.

## 2023-08-14 ENCOUNTER — Other Ambulatory Visit: Payer: Self-pay

## 2023-08-14 MED ORDER — SOD FLUORIDE-POTASSIUM NITRATE 1.1-5 % DT GEL
1.0000 | Freq: Two times a day (BID) | DENTAL | 3 refills | Status: AC
  Filled 2023-08-14: qty 100, 30d supply, fill #0
  Filled 2023-09-19 – 2023-10-01 (×2): qty 100, 30d supply, fill #1

## 2023-08-15 ENCOUNTER — Other Ambulatory Visit: Payer: Self-pay

## 2023-08-15 ENCOUNTER — Encounter
Admission: RE | Admit: 2023-08-15 | Discharge: 2023-08-15 | Disposition: A | Discharge status: Home / Self Care | Payer: 59 | Source: Ambulatory Visit | Attending: Surgery | Admitting: Surgery

## 2023-08-15 DIAGNOSIS — G4733 Obstructive sleep apnea (adult) (pediatric): Secondary | ICD-10-CM | POA: Diagnosis not present

## 2023-08-15 DIAGNOSIS — E782 Mixed hyperlipidemia: Secondary | ICD-10-CM | POA: Insufficient documentation

## 2023-08-15 DIAGNOSIS — F331 Major depressive disorder, recurrent, moderate: Secondary | ICD-10-CM | POA: Diagnosis not present

## 2023-08-15 DIAGNOSIS — Z01812 Encounter for preprocedural laboratory examination: Secondary | ICD-10-CM | POA: Diagnosis present

## 2023-08-15 DIAGNOSIS — E559 Vitamin D deficiency, unspecified: Secondary | ICD-10-CM | POA: Diagnosis not present

## 2023-08-15 DIAGNOSIS — I10 Essential (primary) hypertension: Secondary | ICD-10-CM | POA: Diagnosis not present

## 2023-08-15 DIAGNOSIS — J441 Chronic obstructive pulmonary disease with (acute) exacerbation: Secondary | ICD-10-CM | POA: Insufficient documentation

## 2023-08-15 DIAGNOSIS — R7303 Prediabetes: Secondary | ICD-10-CM | POA: Insufficient documentation

## 2023-08-15 DIAGNOSIS — E538 Deficiency of other specified B group vitamins: Secondary | ICD-10-CM | POA: Diagnosis not present

## 2023-08-15 HISTORY — DX: Essential (primary) hypertension: I10

## 2023-08-15 HISTORY — DX: Mixed hyperlipidemia: E78.2

## 2023-08-15 HISTORY — DX: Unspecified atrial fibrillation: I48.91

## 2023-08-15 LAB — CBC WITH DIFFERENTIAL/PLATELET
Abs Immature Granulocytes: 0.02 10*3/uL (ref 0.00–0.07)
Basophils Absolute: 0.1 10*3/uL (ref 0.0–0.1)
Basophils Relative: 1 %
Eosinophils Absolute: 0.1 10*3/uL (ref 0.0–0.5)
Eosinophils Relative: 2 %
HCT: 40.4 % (ref 36.0–46.0)
Hemoglobin: 13.1 g/dL (ref 12.0–15.0)
Immature Granulocytes: 0 %
Lymphocytes Relative: 25 %
Lymphs Abs: 1.7 10*3/uL (ref 0.7–4.0)
MCH: 31 pg (ref 26.0–34.0)
MCHC: 32.4 g/dL (ref 30.0–36.0)
MCV: 95.7 fL (ref 80.0–100.0)
Monocytes Absolute: 0.7 10*3/uL (ref 0.1–1.0)
Monocytes Relative: 10 %
Neutro Abs: 4.2 10*3/uL (ref 1.7–7.7)
Neutrophils Relative %: 62 %
Platelets: 339 10*3/uL (ref 150–400)
RBC: 4.22 MIL/uL (ref 3.87–5.11)
RDW: 13.2 % (ref 11.5–15.5)
WBC: 6.7 10*3/uL (ref 4.0–10.5)
nRBC: 0 % (ref 0.0–0.2)

## 2023-08-15 LAB — URINALYSIS, ROUTINE W REFLEX MICROSCOPIC
Bilirubin Urine: NEGATIVE
Glucose, UA: NEGATIVE mg/dL
Hgb urine dipstick: NEGATIVE
Ketones, ur: NEGATIVE mg/dL
Leukocytes,Ua: NEGATIVE
Nitrite: NEGATIVE
Protein, ur: NEGATIVE mg/dL
Specific Gravity, Urine: 1.021 (ref 1.005–1.030)
pH: 6 (ref 5.0–8.0)

## 2023-08-15 LAB — COMPREHENSIVE METABOLIC PANEL
ALT: 24 U/L (ref 0–44)
AST: 22 U/L (ref 15–41)
Albumin: 4.1 g/dL (ref 3.5–5.0)
Alkaline Phosphatase: 94 U/L (ref 38–126)
Anion gap: 8 (ref 5–15)
BUN: 18 mg/dL (ref 6–20)
CO2: 30 mmol/L (ref 22–32)
Calcium: 8.8 mg/dL — ABNORMAL LOW (ref 8.9–10.3)
Chloride: 100 mmol/L (ref 98–111)
Creatinine, Ser: 0.99 mg/dL (ref 0.44–1.00)
GFR, Estimated: >60 mL/min (ref >60)
Glucose, Bld: 86 mg/dL (ref 70–99)
Potassium: 4.3 mmol/L (ref 3.5–5.1)
Sodium: 138 mmol/L (ref 135–145)
Total Bilirubin: 0.3 mg/dL (ref 0.3–1.2)
Total Protein: 7.1 g/dL (ref 6.5–8.1)

## 2023-08-15 LAB — SURGICAL PCR SCREEN
MRSA, PCR: NEGATIVE
Staphylococcus aureus: NEGATIVE

## 2023-08-15 NOTE — Patient Instructions (Addendum)
Your procedure is scheduled on: Thursday, November 7 Report to the Registration Desk on the 1st floor of the [facility] To find out your arrival time, please call [phone] between 1PM - 3PM on: Wednesday, November 6 If your arrival time is 6:00 am, do not arrive before that time as the [facility] entrance doors do not open until 6:00 am.  REMEMBER: Instructions that are not followed completely may result in serious medical risk, up to and including death; or upon the discretion of your surgeon and anesthesiologist your surgery may need to be rescheduled.  Do not eat food after midnight the night before surgery.  No gum chewing or hard candies.  You may however, drink CLEAR liquids up to 2 hours before you are scheduled to arrive for your surgery. Do not drink anything within 2 hours of your scheduled arrival time.  Clear liquids include: - water  - apple juice without pulp - gatorade (not RED colors) - black coffee or tea (Do NOT add milk or creamers to the coffee or tea) Do NOT drink anything that is not on this list.  In addition, your doctor has ordered for you to drink the provided:  Ensure Pre-Surgery Clear Carbohydrate Drink  Drinking this carbohydrate drink up to two hours before surgery helps to reduce insulin resistance and improve patient outcomes. Please complete drinking 2 hours before scheduled arrival time.  One week prior to surgery: starting October 31 Stop Anti-inflammatories (NSAIDS) such as Advil, Aleve, Ibuprofen, Motrin, Naproxen, Naprosyn and Aspirin based products such as Excedrin, Goody's Powder, BC Powder. Stop ANY and ALL OVER THE COUNTER supplements until after surgery.  You may however, continue to take Tylenol if needed for pain up until the day of surgery.  Clopidogrel (Plavix) stop 7 days before surgery. Last day to take plavix is Wednesday, October 30. Resume AFTER surgery per surgeon's instruction.  Rivaroxaban (Xarelto) - stop 3 days before  surgery. Last day to take Xarelto is Sunday, November 3. Resume AFTER surgery per surgeon's instruction.  Tirzepatide Greggory Keen) - hold for 7 days before surgery. Do not take Mounjaro on Monday November 4. Resume AFTER surgery on your regular weekly day, Monday, November 11.  Continue taking all of your other prescription medications up until the day of surgery.  ON THE DAY OF SURGERY ONLY TAKE THESE MEDICATIONS WITH SIPS OF WATER:  Atorvastatin Bupropion  (Wellbutrin) Duloxetine (Cymbalta) Gabapentin Metoprolol Pantoprazole (Protonix) Vortioxetine (Trintellix)  No Alcohol for 24 hours before or after surgery.  No Smoking including e-cigarettes for 24 hours before surgery.  No chewable tobacco products for at least 6 hours before surgery.  No nicotine patches on the day of surgery.  Do not use any "recreational" drugs for at least a week (preferably 2 weeks) before your surgery.  Please be advised that the combination of cocaine and anesthesia may have negative outcomes, up to and including death. If you test positive for cocaine, your surgery will be cancelled.  On the morning of surgery brush your teeth with toothpaste and water, you may rinse your mouth with mouthwash if you wish. Do not swallow any toothpaste or mouthwash.  Use CHG Soap as directed on instruction sheet.  Do not wear jewelry, make-up, hairpins, clips or nail polish.  For welded (permanent) jewelry: bracelets, anklets, waist bands, etc.  Please have this removed prior to surgery.  If it is not removed, there is a chance that hospital personnel will need to cut it off on the day of  surgery.  Do not wear lotions, powders, or perfumes.   Do not shave body hair from the neck down 48 hours before surgery.  Contact lenses, hearing aids and dentures may not be worn into surgery.  Do not bring valuables to the hospital. [facility] is not responsible for any missing/lost belongings or valuables.   Total Shoulder  Arthroplasty:  use Benzoyl Peroxide 5% Gel as directed on instruction sheet.  Notify your doctor if there is any change in your medical condition (cold, fever, infection).  Wear comfortable clothing (specific to your surgery type) to the hospital.  After surgery, you can help prevent lung complications by doing breathing exercises.  Take deep breaths and cough every 1-2 hours. Your doctor may order a device called an Incentive Spirometer to help you take deep breaths.  If you are being admitted to the hospital overnight, leave your suitcase in the car. After surgery it may be brought to your room.  In case of increased patient census, it may be necessary for you, the patient, to continue your postoperative care in the Same Day Surgery department.  If you are being discharged the day of surgery, you will not be allowed to drive home. You will need a responsible individual to drive you home and stay with you for 24 hours after surgery.   If you are taking public transportation, you will need to have a responsible individual with you.  Please call the Pre-admissions Testing Dept. at [phone] if you have any questions about these instructions.  Surgery Visitation Policy:  Patients having surgery or a procedure may have two visitors.  Children under the age of 9 must have an adult with them who is not the patient.  Inpatient Visitation:    Visiting hours are 7 a.m. to 8 p.m. Up to four visitors are allowed at one time in a patient room. The visitors may rotate out with other people during the day.  One visitor age 93 or older may stay with the patient overnight and must be in the room by 8 p.m.       Pre-operative 5 CHG Bath Instructions   You can play a key role in reducing the risk of infection after surgery. Your skin needs to be as free of germs as possible. You can reduce the number of germs on your skin by washing with CHG (chlorhexidine gluconate) soap before surgery.  CHG is an antiseptic soap that kills germs and continues to kill germs even after washing.   DO NOT use if you have an allergy to chlorhexidine/CHG or antibacterial soaps. If your skin becomes reddened or irritated, stop using the CHG and notify one of our RNs at [phone].   Please shower with the CHG soap starting 4 days before surgery using the following schedule:     Please keep in mind the following:  DO NOT shave, including legs and underarms, starting the day of your first shower.   You may shave your face at any point before/day of surgery.  Place clean sheets on your bed the day you start using CHG soap. Use a clean washcloth (not used since being washed) for each shower. DO NOT sleep with pets once you start using the CHG.   CHG Shower Instructions:  If you choose to wash your hair and private area, wash first with your normal shampoo/soap.  After you use shampoo/soap, rinse your hair and body thoroughly to remove shampoo/soap residue.  Turn the water OFF and apply  about 3 tablespoons (45 ml) of CHG soap to a CLEAN washcloth.  Apply CHG soap ONLY FROM YOUR NECK DOWN TO YOUR TOES (washing for 3-5 minutes)  DO NOT use CHG soap on face, private areas, open wounds, or sores.  Pay special attention to the area where your surgery is being performed.  If you are having back surgery, having someone wash your back for you may be helpful. Wait 2 minutes after CHG soap is applied, then you may rinse off the CHG soap.  Pat dry with a clean towel  Put on clean clothes/pajamas   If you choose to wear lotion, please use ONLY the CHG-compatible lotions on the back of this paper.     Additional instructions for the day of surgery: DO NOT APPLY any lotions, deodorants, cologne, or perfumes.   Put on clean/comfortable clothes.  Brush your teeth.  Ask your nurse before applying any prescription medications to the skin.      CHG Compatible Lotions   Aveeno Moisturizing lotion   Cetaphil Moisturizing Cream  Cetaphil Moisturizing Lotion  Clairol Herbal Essence Moisturizing Lotion, Dry Skin  Clairol Herbal Essence Moisturizing Lotion, Extra Dry Skin  Clairol Herbal Essence Moisturizing Lotion, Normal Skin  Curel Age Defying Therapeutic Moisturizing Lotion with Alpha Hydroxy  Curel Extreme Care Body Lotion  Curel Soothing Hands Moisturizing Hand Lotion  Curel Therapeutic Moisturizing Cream, Fragrance-Free  Curel Therapeutic Moisturizing Lotion, Fragrance-Free  Curel Therapeutic Moisturizing Lotion, Original Formula  Eucerin Daily Replenishing Lotion  Eucerin Dry Skin Therapy Plus Alpha Hydroxy Crme  Eucerin Dry Skin Therapy Plus Alpha Hydroxy Lotion  Eucerin Original Crme  Eucerin Original Lotion  Eucerin Plus Crme Eucerin Plus Lotion  Eucerin TriLipid Replenishing Lotion  Keri Anti-Bacterial Hand Lotion  Keri Deep Conditioning Original Lotion Dry Skin Formula Softly Scented  Keri Deep Conditioning Original Lotion, Fragrance Free Sensitive Skin Formula  Keri Lotion Fast Absorbing Fragrance Free Sensitive Skin Formula  Keri Lotion Fast Absorbing Softly Scented Dry Skin Formula  Keri Original Lotion  Keri Skin Renewal Lotion Keri Silky Smooth Lotion  Keri Silky Smooth Sensitive Skin Lotion  Nivea Body Creamy Conditioning Oil  Nivea Body Extra Enriched Lotion  Nivea Body Original Lotion  Nivea Body Sheer Moisturizing Lotion Nivea Crme  Nivea Skin Firming Lotion  NutraDerm 30 Skin Lotion  NutraDerm Skin Lotion  NutraDerm Therapeutic Skin Cream  NutraDerm Therapeutic Skin Lotion  ProShield Protective Hand Cream  Provon moisturizing lotion    Preparing for Total Shoulder Arthroplasty  Before surgery, you can play an important role by reducing the number of germs on your skin by using the following products:  Benzoyl Peroxide Gel  o Reduces the number of germs present on the skin  o Applied twice a day to shoulder area starting two days before  surgery  Chlorhexidine Gluconate (CHG) Soap  o An antiseptic cleaner that kills germs and bonds with the skin to continue killing germs even after washing  o Used for showering the night before surgery and morning of surgery  BENZOYL PEROXIDE 5% GEL  Please do not use if you have an allergy to benzoyl peroxide. If your skin becomes reddened/irritated stop using the benzoyl peroxide.  Starting two days before surgery, apply as follows:  1. Apply benzoyl peroxide in the morning and at night. Apply after taking a shower. If you are not taking a shower, clean entire shoulder front, back, and side along with the armpit with a clean wet washcloth.  2. Place a quarter-sized dollop  on your shoulder and rub in thoroughly, making sure to cover the front, back, and side of your shoulder, along with the armpit.  2 days before ____ AM ____ PM 1 day before ____ AM ____ PM  3. Do this twice a day for two days. (Last application is the night before surgery, AFTER using the CHG soap).  4. Do NOT apply benzoyl peroxide gel on the day of surgery.

## 2023-08-16 ENCOUNTER — Other Ambulatory Visit: Payer: Self-pay

## 2023-08-20 ENCOUNTER — Encounter: Payer: Self-pay | Admitting: Surgery

## 2023-08-21 NOTE — Progress Notes (Signed)
Perioperative / Anesthesia Services  Pre-Admission Testing Clinical Review / Pre-Operative Anesthesia Consult  Date: 08/22/23  Patient Demographics:  Name: Terri Wood DOB:   04-13-1966 MRN:   [id number]  Planned Surgical Procedure(s):    Case: [id number] Date/Time: 08/23/23 0715   Procedure: REVERSE SHOULDER ARTHROPLASTY WITH BICEPS TENODESIS (Right: Shoulder)   Anesthesia type: Choice   Pre-op diagnosis:      Traumatic complete tear of right rotator cuff, subsequent encounter S46.011D     Tendinitis of upper biceps tendon of right shoulder M75.21     Rotator cuff tendinitis, right M75.81   Location: [facility] OR ROOM 02 / [facility]   Surgeons: Christena Flake, MD     NOTE: Available PAT nursing documentation and vital signs have been reviewed. Clinical nursing staff has updated patient's PMH/PSHx, current medication list, and drug allergies/intolerances to ensure comprehensive history available to assist in medical decision making as it pertains to the aforementioned surgical procedure and anticipated anesthetic course. Extensive review of available clinical information personally performed. [facility] and [facility] updated with any diagnoses/procedures that  may have been inadvertently omitted during her intake with the pre-admission testing department's nursing staff.  Clinical Discussion:  Terri Wood is a 57 y.o. female who is submitted for pre-surgical anesthesia review and clearance prior to her undergoing the above procedure. Patient is a Former Smoker (quit 07/2019). Pertinent PMH includes: CAD, atrial fibrillation, carotid artery disease, CVA, HTN, HLD, T2DM, GERD (on daily PPI), fibromyalgia, chronic low back pain secondary to lumbar stenosis, RLS, anxiety, depression.  Patient is followed by cardiology Welton Flakes, MD). She was last seen in the cardiology clinic on 08/02/2023; notes reviewed. At the time of her clinic visit, patient doing well overall from a  cardiovascular perspective. Patient denied any chest pain, shortness of breath, PND, orthopnea, palpitations, significant peripheral edema, weakness, fatigue, vertiginous symptoms, or presyncope/syncope. Patient with a past medical history significant for cardiovascular diagnoses. Documented physical exam was grossly benign, providing no evidence of acute exacerbation and/or decompensation of the patient's known cardiovascular conditions.  Patient suffered an acute RIGHT MCA territory CVA on 08/15/2019. Carotid Doppler study was performed on 08/16/2019 revealing a 40-59% stenosis of the LICA.  Total thrombotic occlusion, as opposed to soft plaque, was noted.  Patient has no significant deficits following neurological event  Most recent TTE was performed on 03/09/2023 revealing a normal left ventricular systolic function with an EF of >55%.  There was mild concentric LVH.  No regional wall motion abnormalities noted.  Right ventricular size and function was normal.  PASP was normal.  There was trivial pulmonary and tricuspid valve regurgitation observed. All transvalvular gradients were noted to be normal providing no evidence suggestive of valvular stenosis. Aorta normal in size with no evidence of aneurysmal dilatation.  Myocardial perfusion imaging study was performed on 03/13/2023 revealing a normal left ventricular systolic function with an EF of 62%.  Imaging demonstrated a small mild reversible basal, mid anteroseptal, inferoseptal, and apex wall perfusion defects.  Findings concerning for ischemia in the LAD.  Further evaluation was recommended.  Coronary CTA was performed on 03/29/2023 that demonstrated an Agatston coronary artery calcium score of 673.3. This placed patient in the 99th percentile for age, sex, and race matched controls. Calcium depositions noted to be isolated mainly in the proximal LCx distribution.  LAD and RCA with minor luminal irregularities study demonstrates normal coronary  origin with RIGHT dominance.  Patient with an atrial fibrillation diagnosis; CHA2DS2-VASc Score =  5 (sex, HTN, CVA x 2, vascular disease history). Her rate and rhythm are currently being maintained on oral amiodarone + metoprolol. She is chronically anticoagulated using rivaroxaban.  Additionally, patient is on chronic clopidogrel therapy. Patient is reported to be reported to be compliant with her prescribed anticoagulation and antithrombotic therapies with no evidence or reports of GI/GU bleeding.  Blood pressure well controlled at 120/75 mmHg on currently prescribed alpha blocker (clonidine), ACEi (lisinopril), diuretic (HCTZ), and beta-blocker (metoprolol) therapies. She is on atorvastatin + omega-3 fatty acid capsule for her HLD diagnosis and further ASCVD prevention.  T2DM well-controlled with diet lifestyle modification alone.  Last hemoglobin A1c was 6.0% when checked on 04/27/2023. Patient is able to complete all of her  ADL/IADLs without cardiovascular limitation.  Per the DASI, patient is able to achieve at least 4 METS of physical activity without experiencing any significant degree of angina/anginal equivalent symptoms.  No changes were made to her medication regimen.  Patient to follow-up with outpatient cardiology in 6 weeks or sooner if needed.  Terri Wood is scheduled for an elective REVERSE SHOULDER ARTHROPLASTY WITH BICEPS TENODESIS (Right: Shoulder) on 08/23/2023 with Dr. Leron Croak, MD.  Given patient's past medical history significant for cardiovascular diagnoses, presurgical cardiac clearance was sought by the PAT team.  Per cardiology, "this patient is optimized for surgery and may proceed with the planned procedural course with an ACCEPTABLE risk of significant perioperative cardiovascular complications".  Again, this patient is on both daily anticoagulation and antithrombotic therapies.  She has been instructed on the need to hold her rivaroxaban for 3 days (last dose 08/19/2023)  and her clopidogrel for 7 days (last dose 09/15/2023) prior to her procedure with plans to restart since postoperatively respectively minimized by her primary attending surgeon.  Patient denies previous perioperative complications with anesthesia in the past. In review of the available records, it is noted that patient underwent a neuraxial anesthetic course here at [facility] ([facility]) in 02/2022 without documented complications.      08/15/2023    9:01 AM 08/02/2023   11:20 AM 07/05/2023   10:57 AM  Vitals with BMI  Height 5\' 1"  5\' 1"  5\' 1"   Weight 233 lbs 232 lbs 13 oz 227 lbs  BMI 44.05 44.01 42.91  Systolic 104 120 086  Diastolic 71 75 71  Pulse 65 70 67    Providers/Specialists:   NOTE: Primary physician provider listed below. Patient may have been seen by APP or partner within same practice.   PROVIDER ROLE / SPECIALTY LAST OV  Poggi, Excell Seltzer, MD Orthopedics (Surgeon) 07/27/2023  Miki Kins, FNP Primary Care Provider 06/25/2023  Adrian Blackwater, MD Cardiology 08/02/2023   Allergies:  Penicillins, Levofloxacin, Oxycodone, Veozah [fezolinetant], and Vilazodone  Current Home Medications:   No current facility-administered medications for this encounter.    Ascorbic Acid (VITAMIN C WITH ROSE HIPS) 1000 MG tablet   ASHWAGANDHA PO   atorvastatin (LIPITOR) 80 MG tablet   azelastine (ASTELIN) 0.1 % nasal spray   B Complex-C (B-COMPLEX WITH VITAMIN C) tablet   BIOTIN PO   BLACK COHOSH EXTRACT PO   buPROPion (WELLBUTRIN XL) 150 MG 24 hr tablet   Cholecalciferol (VITAMIN D3) 125 MCG (5000 UT) CAPS   cloNIDine (CATAPRES) 0.1 MG tablet   DULoxetine (CYMBALTA) 60 MG capsule   gabapentin (NEURONTIN) 600 MG tablet   GEMTESA 75 MG TABS   HYDROcodone-acetaminophen (NORCO) 10-325 MG tablet   Lactobacillus-Inulin (PROBIOTIC DIGESTIVE SUPPORT PO)  lisinopril-hydrochlorothiazide (ZESTORETIC) 10-12.5 MG tablet   Magnesium 500 MG CAPS    metoprolol succinate (TOPROL-XL) 25 MG 24 hr tablet   metoprolol tartrate (LOPRESSOR) 50 MG tablet   pantoprazole (PROTONIX) 40 MG tablet   polyethylene glycol (MIRALAX / GLYCOLAX) 17 g packet   rivaroxaban (XARELTO) 20 MG TABS tablet   tirzepatide (MOUNJARO) 15 MG/0.5ML Pen   VITAMIN E PO   vortioxetine HBr (TRINTELLIX) 5 MG TABS tablet   ACCU-CHEK GUIDE test strip   amiodarone (PACERONE) 200 MG tablet   clopidogrel (PLAVIX) 75 MG tablet   MILK THISTLE PO   Omega 3 1000 MG CAPS   Sod Fluoride-Potassium Nitrate (PREVIDENT 5000 ENAMEL PROTECT) 1.1-5 % GEL   History:   Past Medical History:  Diagnosis Date   Acute ischemic right MCA stroke (HCC) 08/15/2019   Acute respiratory failure (HCC) 01/16/2021   a.) in setting of CAP   Anxiety    Aortic atherosclerosis (HCC)    Atrial fibrillation (HCC)    a.) CHA2DS2-VASc = 5 (sex, HTN, CVA x2, vascular disease history) as of 08/20/2023; b.) cardiac rate/rhythm maintained on oral amiodarone + metoprolol; chronically anticoagulated using rivaroxaban; on chronic clopidogrel   Avascular necrosis of left femoral head (HCC) 04/09/2018   CAD (coronary artery disease) 03/13/2023   a.) MV 03/13/2023: small mild reversible bas/mid anterosep/inferosep/apex defects c/w isch; b.) cCTA 03/29/2023: Ca2+ = 673.3 (99th %'ile)   Carotid artery disease (HCC) 08/16/2019   a.) doppler 08/16/2019: 40-59% LICA   Chronic midline low back pain with right-sided sciatica 02/11/2016   Community acquired pneumonia 01/16/2021   Depression    Essential hypertension    Fibromyalgia    Foraminal stenosis of lumbar region 02/27/2018   GERD (gastroesophageal reflux disease)    Long term current use of amiodarone    Lumbar radiculopathy 12/15/2016   Mixed hyperlipidemia    Multifocal pneumonia 06/05/2021   OAB (overactive bladder)    On chronic clopidogrel therapy    On rivaroxaban therapy    Osteoarthritis of left hip 2019   Restless leg syndrome 02/02/2015    Sepsis (HCC) 01/16/2021   T2DM (type 2 diabetes mellitus) (HCC)    Past Surgical History:  Procedure Laterality Date   ABDOMINAL SURGERY  1996   gastric bypass; stapling; surgilite   GASTRIC BYPASS OPEN  1996   PLANTAR FASCIA SURGERY Right 2009   TARSAL TUNNEL RELEASE Right 2009   TOTAL HIP ARTHROPLASTY Left 04/09/2018   Procedure: TOTAL HIP ARTHROPLASTY ANTERIOR APPROACH;  Surgeon: Kennedy Bucker, MD;  Location: [facility] ORS;  Service: Orthopedics;  Laterality: Left;   TOTAL KNEE ARTHROPLASTY Right 07/12/2021   Procedure: TOTAL KNEE ARTHROPLASTY;  Surgeon: Kennedy Bucker, MD;  Location: [facility] ORS;  Service: Orthopedics;  Laterality: Right;   TOTAL KNEE ARTHROPLASTY Left 02/16/2022   Procedure: TOTAL KNEE ARTHROPLASTY;  Surgeon: Kennedy Bucker, MD;  Location: [facility] ORS;  Service: Orthopedics;  Laterality: Left;   Family History  Problem Relation Age of Onset   Aneurysm Mother    Heart disease Father    Social History   Tobacco Use   Smoking status: Former    Current packs/day: 0.00    Types: Cigarettes    Quit date: 07/2019    Years since quitting: 4.1   Smokeless tobacco: Never  Vaping Use   Vaping status: Never Used  Substance Use Topics   Alcohol use: Not Currently   Drug use: Not Currently    Types: Marijuana    Comment: none since 2019  Pertinent Clinical Results:  LABS:   No visits with results within 3 Day(s) from this visit.  Latest known visit with results is:  Hospital Outpatient Visit on 08/15/2023  Component Date Value Ref Range Status   MRSA, PCR 08/15/2023 NEGATIVE  NEGATIVE Final   Staphylococcus aureus 08/15/2023 NEGATIVE  NEGATIVE Final   Comment: (NOTE) The Xpert SA Assay (FDA approved for NASAL specimens in patients 39 years of age and older), is one component of a comprehensive surveillance program. It is not intended to diagnose infection nor to guide or monitor treatment. Performed at [facility], [address] [place], [place]  [address]    WBC 08/15/2023 6.7  4.0 - 10.5 K/uL Final   RBC 08/15/2023 4.22  3.87 - 5.11 MIL/uL Final   Hemoglobin 08/15/2023 13.1  12.0 - 15.0 g/dL Final   HCT 60/45/4098 40.4  36.0 - 46.0 % Final   MCV 08/15/2023 95.7  80.0 - 100.0 fL Final   MCH 08/15/2023 31.0  26.0 - 34.0 pg Final   MCHC 08/15/2023 32.4  30.0 - 36.0 g/dL Final   RDW 11/91/4782 13.2  11.5 - 15.5 % Final   Platelets 08/15/2023 339  150 - 400 K/uL Final   nRBC 08/15/2023 0.0  0.0 - 0.2 % Final   Neutrophils Relative % 08/15/2023 62  % Final   Neutro Abs 08/15/2023 4.2  1.7 - 7.7 K/uL Final   Lymphocytes Relative 08/15/2023 25  % Final   Lymphs Abs 08/15/2023 1.7  0.7 - 4.0 K/uL Final   Monocytes Relative 08/15/2023 10  % Final   Monocytes Absolute 08/15/2023 0.7  0.1 - 1.0 K/uL Final   Eosinophils Relative 08/15/2023 2  % Final   Eosinophils Absolute 08/15/2023 0.1  0.0 - 0.5 K/uL Final   Basophils Relative 08/15/2023 1  % Final   Basophils Absolute 08/15/2023 0.1  0.0 - 0.1 K/uL Final   Immature Granulocytes 08/15/2023 0  % Final   Abs Immature Granulocytes 08/15/2023 0.02  0.00 - 0.07 K/uL Final   Performed at [facility], [address] [place], [place] [address]   Sodium 08/15/2023 138  135 - 145 mmol/L Final   Potassium 08/15/2023 4.3  3.5 - 5.1 mmol/L Final   Chloride 08/15/2023 100  98 - 111 mmol/L Final   CO2 08/15/2023 30  22 - 32 mmol/L Final   Glucose, Bld 08/15/2023 86  70 - 99 mg/dL Final   Glucose reference range applies only to samples taken after fasting for at least 8 hours.   BUN 08/15/2023 18  6 - 20 mg/dL Final   Creatinine, Ser 08/15/2023 0.99  0.44 - 1.00 mg/dL Final   Calcium 30/86/5784 8.8 (L)  8.9 - 10.3 mg/dL Final   Total Protein 69/62/9528 7.1  6.5 - 8.1 g/dL Final   Albumin 41/32/4401 4.1  3.5 - 5.0 g/dL Final   AST 02/72/5366 22  15 - 41 U/L Final   ALT 08/15/2023 24  0 - 44 U/L Final   Alkaline Phosphatase 08/15/2023 94  38 - 126 U/L Final   Total Bilirubin 08/15/2023  0.3  0.3 - 1.2 mg/dL Final   GFR, Estimated 08/15/2023 >60  >60 mL/min Final   Comment: (NOTE) Calculated using the CKD-EPI Creatinine Equation (2021)    Anion gap 08/15/2023 8  5 - 15 Final   Performed at [facility], [address] [place], [place] [address]   Color, Urine 08/15/2023 YELLOW (A)  YELLOW Final  APPearance 08/15/2023 HAZY (A)  CLEAR Final   Specific Gravity, Urine 08/15/2023 1.021  1.005 - 1.030 Final   pH 08/15/2023 6.0  5.0 - 8.0 Final   Glucose, UA 08/15/2023 NEGATIVE  NEGATIVE mg/dL Final   Hgb urine dipstick 08/15/2023 NEGATIVE  NEGATIVE Final   Bilirubin Urine 08/15/2023 NEGATIVE  NEGATIVE Final   Ketones, ur 08/15/2023 NEGATIVE  NEGATIVE mg/dL Final   Protein, ur 16/07/9603 NEGATIVE  NEGATIVE mg/dL Final   Nitrite 54/06/8118 NEGATIVE  NEGATIVE Final   Leukocytes,Ua 08/15/2023 NEGATIVE  NEGATIVE Final   Performed at [facility], [address] [place], [place] [address]    ECG: Date: 07/05/2023 Time ECG obtained: 1019 AM Rate: 62 bpm Rhythm: normal sinus Axis (leads I and aVF): Normal ST segment and T wave changes: No evidence of acute ST segment elevation or depression.  Evidence of a possible age undetermined inferolateral infarct present. Comparison: Similar to previous tracing obtained on 02/23/2023   IMAGING / PROCEDURES: MR SHOULDER RIGHT WO CONTRAST performed on 05/03/2023 Complete tear of the supraspinatus tendon with 2.4 cm of retraction. Severe tendinosis of the infraspinatus tendon. Moderate tendinosis of the subscapularis tendon with a partial-thickness tear. Moderate tendinosis of the intra-articular portion of the long head of the biceps tendon.  CT CORONARY MORPH W/CTA COR W/SCORE W/CA W/CM &/OR WO/CM performed on 03/29/2023 Coronary calcium score of 673.3; 99th percentile for age, sex, and race matched control. Normal coronary origin with RIGHT-sided dominance Proximal LCx calcified with 40% disease LAD and RCA  with mild luminal irregularities Recommendations: medical management  MYOCARDIAL PERFUSION IMAGING STUDY (LEXISCAN) performed on 03/13/2023 Normal left ventricular systolic function with an EF of 62% There were no significant wall motion abnormalities SPECT images demonstrated a small mild reversible basal, mid anteroseptal, inferoseptal, and apex wall defects. Findings consistent with ischemia in the LAD territory with normal LVEF  TRANSTHORACIC ECHOCARDIOGRAM performed on 03/09/2023 Normal left ventricular systolic function with an EF of >55% Mild LVH No regional wall motion abnormalities Right ventricular size and function normal Trivial pulmonary and tricuspid valve regurgitation Mild mitral valve regurgitation No pericardial effusion  Impression and Plan:  Terri Wood has been referred for pre-anesthesia review and clearance prior to her undergoing the planned anesthetic and procedural courses. Available labs, pertinent testing, and imaging results were personally reviewed by me in preparation for upcoming operative/procedural course. [facility] medical record has been updated following extensive record review and patient interview with PAT staff.   This patient has been appropriately cleared by cardiology with an overall ACCEPTABLE risk of experiencing significant perioperative cardiovascular complications. Based on clinical review performed today (08/22/23), barring any significant acute changes in the patient's overall condition, it is anticipated that she will be able to proceed with the planned surgical intervention. Any acute changes in clinical condition may necessitate her procedure being postponed and/or cancelled. Patient will meet with anesthesia team (MD and/or CRNA) on the day of her procedure for preoperative evaluation/assessment. Questions regarding anesthetic course will be fielded at that time.   Pre-surgical instructions were reviewed with the patient during her  PAT appointment, and questions were fielded to satisfaction by PAT clinical staff. She has been instructed on which medications that she will need to hold prior to surgery, as well as the ones that have been deemed safe/appropriate to take on the day of her procedure. As part of the general education provided by PAT, patient made aware both verbally and in writing, that she would need to abstain  from the use of any illegal substances during her perioperative course.  She was advised that failure to follow the provided instructions could necessitate case cancellation or result in serious perioperative complications up to and including death. Patient encouraged to contact PAT and/or her surgeon's office to discuss any questions or concerns that may arise prior to surgery; verbalized understanding.   Quentin Mulling, MSN, APRN, FNP-C, CEN [facility]  Perioperative Services Nurse Practitioner Phone: [phone] Fax: [phone] 08/22/23 12:15 PM  NOTE: This note has been prepared using Dragon dictation software. Despite my best ability to proofread, there is always the potential that unintentional transcriptional errors may still occur from this process.

## 2023-08-22 ENCOUNTER — Encounter: Payer: Self-pay | Admitting: Surgery

## 2023-08-22 MED ORDER — LACTATED RINGERS IV SOLN: INTRAVENOUS | Status: DC

## 2023-08-22 MED ORDER — CHLORHEXIDINE GLUCONATE 0.12 % MT SOLN
15.0000 mL | Freq: Once | OROMUCOSAL | Status: AC
  Administered 2023-08-23: 15 mL via OROMUCOSAL

## 2023-08-22 MED ORDER — CEFAZOLIN SODIUM-DEXTROSE 2-4 GM/100ML-% IV SOLN
2.0000 g | INTRAVENOUS | Status: AC
  Administered 2023-08-23: 2 g via INTRAVENOUS

## 2023-08-22 MED ORDER — ORAL CARE MOUTH RINSE: 15.0000 mL | Freq: Once | OROMUCOSAL | Status: AC

## 2023-08-23 ENCOUNTER — Other Ambulatory Visit: Payer: Self-pay

## 2023-08-23 ENCOUNTER — Ambulatory Visit: Payer: 59 | Admitting: Urgent Care

## 2023-08-23 ENCOUNTER — Encounter
Admission: RE | Disposition: A | Discharge status: Home / Self Care | Payer: Self-pay | Source: Ambulatory Visit | Attending: Surgery

## 2023-08-23 ENCOUNTER — Ambulatory Visit: Payer: 59

## 2023-08-23 ENCOUNTER — Encounter: Payer: Self-pay | Admitting: Surgery

## 2023-08-23 ENCOUNTER — Ambulatory Visit
Admission: RE | Admit: 2023-08-23 | Discharge: 2023-08-23 | Disposition: A | Discharge status: Home / Self Care | Payer: 59 | Source: Ambulatory Visit | Attending: Surgery | Admitting: Surgery

## 2023-08-23 DIAGNOSIS — I10 Essential (primary) hypertension: Secondary | ICD-10-CM | POA: Insufficient documentation

## 2023-08-23 DIAGNOSIS — Z79899 Other long term (current) drug therapy: Secondary | ICD-10-CM | POA: Insufficient documentation

## 2023-08-23 DIAGNOSIS — F419 Anxiety disorder, unspecified: Secondary | ICD-10-CM | POA: Insufficient documentation

## 2023-08-23 DIAGNOSIS — K219 Gastro-esophageal reflux disease without esophagitis: Secondary | ICD-10-CM | POA: Diagnosis not present

## 2023-08-23 DIAGNOSIS — F32A Depression, unspecified: Secondary | ICD-10-CM | POA: Insufficient documentation

## 2023-08-23 DIAGNOSIS — R42 Dizziness and giddiness: Secondary | ICD-10-CM

## 2023-08-23 DIAGNOSIS — Z8679 Personal history of other diseases of the circulatory system: Secondary | ICD-10-CM

## 2023-08-23 DIAGNOSIS — F1120 Opioid dependence, uncomplicated: Secondary | ICD-10-CM | POA: Insufficient documentation

## 2023-08-23 DIAGNOSIS — E119 Type 2 diabetes mellitus without complications: Secondary | ICD-10-CM | POA: Insufficient documentation

## 2023-08-23 DIAGNOSIS — G2581 Restless legs syndrome: Secondary | ICD-10-CM | POA: Diagnosis not present

## 2023-08-23 DIAGNOSIS — M7521 Bicipital tendinitis, right shoulder: Secondary | ICD-10-CM | POA: Diagnosis not present

## 2023-08-23 DIAGNOSIS — S46011A Strain of muscle(s) and tendon(s) of the rotator cuff of right shoulder, initial encounter: Secondary | ICD-10-CM | POA: Insufficient documentation

## 2023-08-23 DIAGNOSIS — Z87891 Personal history of nicotine dependence: Secondary | ICD-10-CM | POA: Diagnosis not present

## 2023-08-23 DIAGNOSIS — I6523 Occlusion and stenosis of bilateral carotid arteries: Secondary | ICD-10-CM

## 2023-08-23 DIAGNOSIS — I2510 Atherosclerotic heart disease of native coronary artery without angina pectoris: Secondary | ICD-10-CM | POA: Insufficient documentation

## 2023-08-23 DIAGNOSIS — Z8673 Personal history of transient ischemic attack (TIA), and cerebral infarction without residual deficits: Secondary | ICD-10-CM | POA: Insufficient documentation

## 2023-08-23 DIAGNOSIS — X58XXXA Exposure to other specified factors, initial encounter: Secondary | ICD-10-CM | POA: Diagnosis not present

## 2023-08-23 DIAGNOSIS — M797 Fibromyalgia: Secondary | ICD-10-CM | POA: Diagnosis not present

## 2023-08-23 DIAGNOSIS — E782 Mixed hyperlipidemia: Secondary | ICD-10-CM | POA: Insufficient documentation

## 2023-08-23 DIAGNOSIS — I739 Peripheral vascular disease, unspecified: Secondary | ICD-10-CM | POA: Diagnosis not present

## 2023-08-23 DIAGNOSIS — E1165 Type 2 diabetes mellitus with hyperglycemia: Secondary | ICD-10-CM

## 2023-08-23 DIAGNOSIS — Z01812 Encounter for preprocedural laboratory examination: Secondary | ICD-10-CM

## 2023-08-23 DIAGNOSIS — I4891 Unspecified atrial fibrillation: Secondary | ICD-10-CM | POA: Diagnosis not present

## 2023-08-23 DIAGNOSIS — M7581 Other shoulder lesions, right shoulder: Secondary | ICD-10-CM | POA: Diagnosis not present

## 2023-08-23 DIAGNOSIS — Z7901 Long term (current) use of anticoagulants: Secondary | ICD-10-CM | POA: Insufficient documentation

## 2023-08-23 DIAGNOSIS — I679 Cerebrovascular disease, unspecified: Secondary | ICD-10-CM

## 2023-08-23 DIAGNOSIS — I63511 Cerebral infarction due to unspecified occlusion or stenosis of right middle cerebral artery: Secondary | ICD-10-CM

## 2023-08-23 HISTORY — DX: Atherosclerosis of aorta: I70.0

## 2023-08-23 HISTORY — DX: Long term (current) use of anticoagulants: Z79.01

## 2023-08-23 HISTORY — DX: Type 2 diabetes mellitus without complications: E11.9

## 2023-08-23 HISTORY — DX: Other long term (current) drug therapy: Z79.899

## 2023-08-23 HISTORY — DX: Overactive bladder: N32.81

## 2023-08-23 HISTORY — PX: REVERSE SHOULDER ARTHROPLASTY: SHX5054

## 2023-08-23 LAB — GLUCOSE, CAPILLARY
Glucose-Capillary: 62 mg/dL — ABNORMAL LOW (ref 70–99)
Glucose-Capillary: 63 mg/dL — ABNORMAL LOW (ref 70–99)
Glucose-Capillary: 119 mg/dL — ABNORMAL HIGH (ref 70–99)
Glucose-Capillary: 98 mg/dL (ref 70–99)

## 2023-08-23 SURGERY — ARTHROPLASTY, SHOULDER, TOTAL, REVERSE
Anesthesia: General | Site: Shoulder | Laterality: Right

## 2023-08-23 MED ORDER — EPHEDRINE 5 MG/ML INJ
INTRAVENOUS | Status: AC
  Filled 2023-08-23: qty 5

## 2023-08-23 MED ORDER — BUPIVACAINE HCL (PF) 0.5 % IJ SOLN
INTRAMUSCULAR | Status: AC
  Filled 2023-08-23: qty 10

## 2023-08-23 MED ORDER — DEXTROSE 50 % IV SOLN
INTRAVENOUS | Status: AC
  Filled 2023-08-23: qty 50

## 2023-08-23 MED ORDER — MIDAZOLAM HCL 2 MG/2ML IJ SOLN
INTRAMUSCULAR | Status: AC
  Filled 2023-08-23: qty 2

## 2023-08-23 MED ORDER — SODIUM CHLORIDE 0.9 % IV SOLN: INTRAVENOUS | Status: DC

## 2023-08-23 MED ORDER — FENTANYL CITRATE (PF) 100 MCG/2ML IJ SOLN
INTRAMUSCULAR | Status: DC | PRN
  Administered 2023-08-23: 50 ug via INTRAVENOUS

## 2023-08-23 MED ORDER — BUPIVACAINE LIPOSOME 1.3 % IJ SUSP
INTRAMUSCULAR | Status: AC
  Filled 2023-08-23: qty 20

## 2023-08-23 MED ORDER — DEXAMETHASONE SODIUM PHOSPHATE 10 MG/ML IJ SOLN
INTRAMUSCULAR | Status: AC
  Filled 2023-08-23: qty 1

## 2023-08-23 MED ORDER — CEFAZOLIN SODIUM-DEXTROSE 2-4 GM/100ML-% IV SOLN
2.0000 g | Freq: Four times a day (QID) | INTRAVENOUS | Status: DC
  Administered 2023-08-23: 2 g via INTRAVENOUS

## 2023-08-23 MED ORDER — METOCLOPRAMIDE HCL 5 MG/ML IJ SOLN: 5.0000 mg | Freq: Three times a day (TID) | INTRAMUSCULAR | Status: DC | PRN

## 2023-08-23 MED ORDER — HYDROCODONE-ACETAMINOPHEN 5-325 MG PO TABS: 1.5000 | ORAL_TABLET | ORAL | Status: DC | PRN

## 2023-08-23 MED ORDER — DEXTROSE 50 % IV SOLN
INTRAVENOUS | Status: DC | PRN
  Administered 2023-08-23: 1 via INTRAVENOUS

## 2023-08-23 MED ORDER — TRANEXAMIC ACID-NACL 1000-0.7 MG/100ML-% IV SOLN
INTRAVENOUS | Status: AC
  Filled 2023-08-23: qty 100

## 2023-08-23 MED ORDER — ONDANSETRON HCL 4 MG/2ML IJ SOLN
INTRAMUSCULAR | Status: AC
  Filled 2023-08-23: qty 2

## 2023-08-23 MED ORDER — CEFAZOLIN SODIUM-DEXTROSE 2-4 GM/100ML-% IV SOLN
INTRAVENOUS | Status: AC
  Filled 2023-08-23: qty 100

## 2023-08-23 MED ORDER — SODIUM CHLORIDE 0.9 % IR SOLN
Status: DC | PRN
  Administered 2023-08-23: 3000 mL

## 2023-08-23 MED ORDER — SUGAMMADEX SODIUM 200 MG/2ML IV SOLN
INTRAVENOUS | Status: DC | PRN
  Administered 2023-08-23: 210 mg via INTRAVENOUS

## 2023-08-23 MED ORDER — CHLORHEXIDINE GLUCONATE 0.12 % MT SOLN
OROMUCOSAL | Status: AC
  Filled 2023-08-23: qty 15

## 2023-08-23 MED ORDER — TRIAMCINOLONE ACETONIDE 40 MG/ML IJ SUSP
INTRAMUSCULAR | Status: AC
  Filled 2023-08-23: qty 2

## 2023-08-23 MED ORDER — DEXTROSE 50 % IV SOLN
1.0000 | Freq: Once | INTRAVENOUS | Status: AC
  Administered 2023-08-23: 50 mL via INTRAVENOUS

## 2023-08-23 MED ORDER — AMIODARONE HCL 200 MG PO TABS: 200.0000 mg | ORAL_TABLET | Freq: Every day | ORAL | Status: DC | PRN

## 2023-08-23 MED ORDER — CEFAZOLIN SODIUM-DEXTROSE 2-4 GM/100ML-% IV SOLN
INTRAVENOUS | Status: AC
Start: 2023-08-23 — End: ?
  Filled 2023-08-23: qty 100

## 2023-08-23 MED ORDER — ROCURONIUM BROMIDE 100 MG/10ML IV SOLN
INTRAVENOUS | Status: DC | PRN
  Administered 2023-08-23: 10 mg via INTRAVENOUS
  Administered 2023-08-23: 50 mg via INTRAVENOUS

## 2023-08-23 MED ORDER — LIDOCAINE HCL (PF) 2 % IJ SOLN
INTRAMUSCULAR | Status: AC
  Filled 2023-08-23: qty 5

## 2023-08-23 MED ORDER — PHENYLEPHRINE HCL-NACL 20-0.9 MG/250ML-% IV SOLN
INTRAVENOUS | Status: DC | PRN
  Administered 2023-08-23: 50 ug/min via INTRAVENOUS

## 2023-08-23 MED ORDER — PHENYLEPHRINE 80 MCG/ML (10ML) SYRINGE FOR IV PUSH (FOR BLOOD PRESSURE SUPPORT)
PREFILLED_SYRINGE | INTRAVENOUS | Status: DC | PRN
  Administered 2023-08-23 (×3): 80 ug via INTRAVENOUS

## 2023-08-23 MED ORDER — LISINOPRIL-HYDROCHLOROTHIAZIDE 10-12.5 MG PO TABS: 1.0000 | ORAL_TABLET | Freq: Every day | ORAL | Status: DC

## 2023-08-23 MED ORDER — KETOROLAC TROMETHAMINE 30 MG/ML IJ SOLN
INTRAMUSCULAR | Status: AC
  Filled 2023-08-23: qty 1

## 2023-08-23 MED ORDER — ONDANSETRON HCL 4 MG PO TABS: 4.0000 mg | ORAL_TABLET | Freq: Four times a day (QID) | ORAL | Status: DC | PRN

## 2023-08-23 MED ORDER — ONDANSETRON HCL 4 MG/2ML IJ SOLN: 4.0000 mg | Freq: Four times a day (QID) | INTRAMUSCULAR | Status: DC | PRN

## 2023-08-23 MED ORDER — BUPIVACAINE-EPINEPHRINE (PF) 0.5% -1:200000 IJ SOLN
INTRAMUSCULAR | Status: DC | PRN
  Administered 2023-08-23: 30 mL

## 2023-08-23 MED ORDER — PROPOFOL 10 MG/ML IV BOLUS
INTRAVENOUS | Status: DC | PRN
  Administered 2023-08-23: 100 mg via INTRAVENOUS

## 2023-08-23 MED ORDER — FENTANYL CITRATE (PF) 100 MCG/2ML IJ SOLN: 25.0000 ug | INTRAMUSCULAR | Status: DC | PRN

## 2023-08-23 MED ORDER — BUPIVACAINE HCL (PF) 0.5 % IJ SOLN
INTRAMUSCULAR | Status: DC | PRN
  Administered 2023-08-23: 10 mL via PERINEURAL

## 2023-08-23 MED ORDER — FENTANYL CITRATE (PF) 100 MCG/2ML IJ SOLN
INTRAMUSCULAR | Status: AC
  Filled 2023-08-23: qty 2

## 2023-08-23 MED ORDER — PHENYLEPHRINE HCL-NACL 20-0.9 MG/250ML-% IV SOLN
INTRAVENOUS | Status: AC
  Filled 2023-08-23: qty 250

## 2023-08-23 MED ORDER — MIDAZOLAM HCL 2 MG/2ML IJ SOLN
INTRAMUSCULAR | Status: DC | PRN
  Administered 2023-08-23: 2 mg via INTRAVENOUS

## 2023-08-23 MED ORDER — HYDROCODONE-ACETAMINOPHEN 10-325 MG PO TABS: 1.0000 | ORAL_TABLET | ORAL | 0 refills | Status: AC | PRN

## 2023-08-23 MED ORDER — GABAPENTIN 600 MG PO TABS: 600.0000 mg | ORAL_TABLET | Freq: Three times a day (TID) | ORAL | Status: DC

## 2023-08-23 MED ORDER — 0.9 % SODIUM CHLORIDE (POUR BTL) OPTIME
TOPICAL | Status: DC | PRN
  Administered 2023-08-23: 1000 mL

## 2023-08-23 MED ORDER — ROCURONIUM BROMIDE 10 MG/ML (PF) SYRINGE
PREFILLED_SYRINGE | INTRAVENOUS | Status: AC
  Filled 2023-08-23: qty 10

## 2023-08-23 MED ORDER — TRANEXAMIC ACID-NACL 1000-0.7 MG/100ML-% IV SOLN
1000.0000 mg | INTRAVENOUS | Status: AC
  Administered 2023-08-23: 1000 mg via INTRAVENOUS

## 2023-08-23 MED ORDER — MOUNJARO 15 MG/0.5ML ~~LOC~~ SOAJ: 15.0000 mg | SUBCUTANEOUS | Status: DC

## 2023-08-23 MED ORDER — BUPIVACAINE-EPINEPHRINE (PF) 0.5% -1:200000 IJ SOLN
INTRAMUSCULAR | Status: AC
  Filled 2023-08-23: qty 10

## 2023-08-23 MED ORDER — HYDROCODONE-ACETAMINOPHEN 10-325 MG PO TABS
1.0000 | ORAL_TABLET | ORAL | 0 refills | Status: DC | PRN
  Filled 2023-08-23: qty 40, 7d supply, fill #0

## 2023-08-23 MED ORDER — METOCLOPRAMIDE HCL 10 MG PO TABS: 5.0000 mg | ORAL_TABLET | Freq: Three times a day (TID) | ORAL | Status: DC | PRN

## 2023-08-23 MED ORDER — LIDOCAINE HCL (CARDIAC) PF 100 MG/5ML IV SOSY
PREFILLED_SYRINGE | INTRAVENOUS | Status: DC | PRN
  Administered 2023-08-23: 40 mg via INTRAVENOUS

## 2023-08-23 MED ORDER — ACETAMINOPHEN 325 MG PO TABS: 325.0000 mg | ORAL_TABLET | Freq: Four times a day (QID) | ORAL | Status: DC | PRN

## 2023-08-23 MED ORDER — EPHEDRINE SULFATE-NACL 50-0.9 MG/10ML-% IV SOSY
PREFILLED_SYRINGE | INTRAVENOUS | Status: DC | PRN
  Administered 2023-08-23 (×5): 10 mg via INTRAVENOUS

## 2023-08-23 MED ORDER — KETOROLAC TROMETHAMINE 30 MG/ML IJ SOLN
30.0000 mg | Freq: Once | INTRAMUSCULAR | Status: AC
  Administered 2023-08-23: 30 mg via INTRAVENOUS

## 2023-08-23 MED ORDER — PROPOFOL 10 MG/ML IV BOLUS
INTRAVENOUS | Status: AC
  Filled 2023-08-23: qty 20

## 2023-08-23 MED ORDER — ACETAMINOPHEN 10 MG/ML IV SOLN: 1000.0000 mg | Freq: Once | INTRAVENOUS | Status: DC | PRN

## 2023-08-23 MED ORDER — ONDANSETRON HCL 4 MG/2ML IJ SOLN
INTRAMUSCULAR | Status: DC | PRN
  Administered 2023-08-23: 4 mg via INTRAVENOUS

## 2023-08-23 MED ORDER — DEXAMETHASONE SODIUM PHOSPHATE 10 MG/ML IJ SOLN
INTRAMUSCULAR | Status: DC | PRN
  Administered 2023-08-23: 8 mg via INTRAVENOUS

## 2023-08-23 MED ORDER — BUPIVACAINE LIPOSOME 1.3 % IJ SUSP
INTRAMUSCULAR | Status: DC | PRN
Start: 2023-08-23 — End: 2023-08-23
  Administered 2023-08-23: 20 mL via PERINEURAL

## 2023-08-23 MED ORDER — FENTANYL CITRATE PF 50 MCG/ML IJ SOSY
PREFILLED_SYRINGE | INTRAMUSCULAR | Status: AC
  Filled 2023-08-23: qty 1

## 2023-08-23 MED ORDER — DROPERIDOL 2.5 MG/ML IJ SOLN: 0.6250 mg | Freq: Once | INTRAMUSCULAR | Status: DC | PRN

## 2023-08-23 SURGICAL SUPPLY — 70 items
APL PRP STRL LF DISP 70% ISPRP (MISCELLANEOUS) ×2
BIT DRILL 3 FOR 4.5 SCREW (BIT) ×1
BIT DRILL 3MM FOR 4.5 SCREW (BIT) IMPLANT
BLADE SAW SAG 25X90X1.19 (BLADE) ×1 IMPLANT
BSPLAT GLND RVRS SHLDR CENTRD (Plate) ×1 IMPLANT
CHLORAPREP W/TINT 26 (MISCELLANEOUS) ×1 IMPLANT
COOLER POLAR GLACIER W/PUMP (MISCELLANEOUS) ×1 IMPLANT
DRAPE INCISE IOBAN 66X45 STRL (DRAPES) ×1 IMPLANT
DRAPE SHEET LG 3/4 BI-LAMINATE (DRAPES) ×1 IMPLANT
DRAPE TABLE BACK 80X90 (DRAPES) ×1 IMPLANT
DRILL BIT 3MM FOR 4.5 SCREW (BIT) ×1
DRSG OPSITE POSTOP 4X8 (GAUZE/BANDAGES/DRESSINGS) ×1 IMPLANT
ELECT BLADE 6.5 EXT (BLADE) IMPLANT
ELECT CAUTERY BLADE 6.4 (BLADE) ×1 IMPLANT
ELECT REM PT RETURN 9FT ADLT (ELECTROSURGICAL) ×1
ELECTRODE REM PT RTRN 9FT ADLT (ELECTROSURGICAL) ×1 IMPLANT
GAUZE XEROFORM 1X8 LF (GAUZE/BANDAGES/DRESSINGS) ×1 IMPLANT
GLENOID BSPLAT RVS SHLDR CNTRD (Plate) IMPLANT
GLENOSPHERE CON AETOS 34 RSS (Shoulder) IMPLANT
GLOVE BIO SURGEON STRL SZ7.5 (GLOVE) ×4 IMPLANT
GLOVE BIO SURGEON STRL SZ8 (GLOVE) ×4 IMPLANT
GLOVE BIOGEL PI IND STRL 8 (GLOVE) ×2 IMPLANT
GLOVE INDICATOR 8.0 STRL GRN (GLOVE) ×1 IMPLANT
GOWN STRL REUS W/ TWL LRG LVL3 (GOWN DISPOSABLE) ×1 IMPLANT
GOWN STRL REUS W/ TWL XL LVL3 (GOWN DISPOSABLE) ×1 IMPLANT
GOWN STRL REUS W/TWL LRG LVL3 (GOWN DISPOSABLE) ×1
GOWN STRL REUS W/TWL XL LVL3 (GOWN DISPOSABLE) ×2
GUIDE PIN GLENOID 2.5 NT (PIN) ×1
GUIDE PIN NUMERAL 3.5 NT (PIN) ×1
HANDLE YANKAUER SUCT OPEN TIP (MISCELLANEOUS) ×1 IMPLANT
HOOD PEEL AWAY T7 (MISCELLANEOUS) ×3 IMPLANT
IV NS IRRIG 3000ML ARTHROMATIC (IV SOLUTION) ×1 IMPLANT
KIT STABILIZATION SHOULDER (MISCELLANEOUS) ×1 IMPLANT
KIT TURNOVER KIT A (KITS) ×1 IMPLANT
LINER REVERSE RSS 34 SM +3 (Liner) IMPLANT
MANIFOLD NEPTUNE II (INSTRUMENTS) ×1 IMPLANT
MASK FACE SPIDER DISP (MASK) ×1 IMPLANT
MAT ABSORB FLUID 56X50 GRAY (MISCELLANEOUS) ×1 IMPLANT
NDL MAYO CATGUT SZ1 (NEEDLE) IMPLANT
NDL SAFETY ECLIPSE 18X1.5 (NEEDLE) ×1 IMPLANT
NDL SPNL 20GX3.5 QUINCKE YW (NEEDLE) ×1 IMPLANT
NEEDLE MAYO CATGUT SZ1 (NEEDLE) IMPLANT
NEEDLE SPNL 20GX3.5 QUINCKE YW (NEEDLE) ×1 IMPLANT
NS IRRIG 500ML POUR BTL (IV SOLUTION) ×1 IMPLANT
PACK ARTHROSCOPY SHOULDER (MISCELLANEOUS) ×1 IMPLANT
PAD ARMBOARD 7.5X6 YLW CONV (MISCELLANEOUS) ×1 IMPLANT
PAD WRAPON POLAR SHDR UNIV (MISCELLANEOUS) ×1 IMPLANT
PIN GUIDE GLENOID 2.5 NT (PIN) IMPLANT
PIN GUIDE NUMERAL 3.5 NT (PIN) IMPLANT
PULSAVAC PLUS IRRIG FAN TIP (DISPOSABLE) ×1
SCREW CENTER 4.5X30 RSS (Screw) IMPLANT
SCREW LOCK 4.5X15 RSS (Screw) IMPLANT
SCREW LOCKING 4.5X40 RSS (Screw) IMPLANT
SLING ULTRA II M (MISCELLANEOUS) IMPLANT
SPONGE T-LAP 18X18 ~~LOC~~+RFID (SPONGE) ×2 IMPLANT
STAPLER SKIN PROX 35W (STAPLE) ×1 IMPLANT
STEM HUM AETOS SZ1 SM RSS (Stem) IMPLANT
SUT ETHIBOND 0 MO6 C/R (SUTURE) ×1 IMPLANT
SUT FIBERWIRE #2 38 BLUE 1/2 (SUTURE) ×4
SUT VIC AB 0 CT1 36 (SUTURE) ×1 IMPLANT
SUT VIC AB 2-0 CT1 27 (SUTURE) ×2
SUT VIC AB 2-0 CT1 TAPERPNT 27 (SUTURE) ×2 IMPLANT
SUTURE FIBERWR #2 38 BLUE 1/2 (SUTURE) ×4 IMPLANT
SYR 10ML LL (SYRINGE) ×1 IMPLANT
SYR 30ML LL (SYRINGE) ×1 IMPLANT
SYR TOOMEY 50ML (SYRINGE) ×1 IMPLANT
TIP FAN IRRIG PULSAVAC PLUS (DISPOSABLE) ×1 IMPLANT
TRAP FLUID SMOKE EVACUATOR (MISCELLANEOUS) ×1 IMPLANT
WATER STERILE IRR 500ML POUR (IV SOLUTION) ×1 IMPLANT
WRAPON POLAR PAD SHDR UNIV (MISCELLANEOUS) ×1

## 2023-08-23 NOTE — Anesthesia Preprocedure Evaluation (Addendum)
Anesthesia Evaluation  Patient identified by MRN, date of birth, ID band Patient awake    Reviewed: Allergy & Precautions, NPO status , Patient's Chart, lab work & pertinent test results, reviewed documented beta blocker date and time   History of Anesthesia Complications Negative for: history of anesthetic complications  Airway Mallampati: II  TM Distance: >3 FB Neck ROM: Full    Dental  (+) Partial Upper, Partial Lower   Pulmonary neg sleep apnea, neg COPD, Patient abstained from smoking.Not current smoker, former smoker   Pulmonary exam normal breath sounds clear to auscultation       Cardiovascular Exercise Tolerance: Good METShypertension, Pt. on medications and Pt. on home beta blockers + CAD and + Peripheral Vascular Disease (Carotid artery disease- doppler 08/16/2019: 40-59% LICA)  (-) Past MI + dysrhythmias Atrial Fibrillation  Rhythm:Regular Rate:Normal - Systolic murmurs MV 03/13/2023: small mild reversible bas/mid anterosep/inferosep/apex defects c/w isch; b.) cCTA 03/29/2023: Ca2+ = 673.3 (99th %'ile)  Afib- cardiac rate/rhythm maintained on oral amiodarone + metoprolol; chronically anticoagulated using rivaroxaban; on chronic clopidogrel    ECHO- 03/09/2023 revealing a normal left ventricular systolic function with an EF of >55%.  There was mild concentric LVH.  No regional wall motion abnormalities noted.  Right ventricular size and function was normal.  PASP was normal.  There was trivial pulmonary and tricuspid valve regurgitation observed. All transvalvular gradients were noted to be normal providing no evidence suggestive of valvular stenosis. Aorta normal in size with no evidence of aneurysmal dilatation.    Myocardial perfusion imaging study was performed on 03/13/2023 revealing a normal left ventricular systolic function with an EF of 62%.  Imaging demonstrated a small mild reversible basal, mid anteroseptal,  inferoseptal, and apex wall perfusion defects.  Findings concerning for ischemia in the LAD.  Further evaluation was recommended.    Coronary CTA was performed on 03/29/2023 that demonstrated an Agatston coronary artery calcium score of 673.3. This placed patient in the 99th percentile for age, sex, and race matched controls. Calcium depositions noted to be isolated mainly in the proximal LCx distribution.  LAD and RCA with minor luminal irregularities study demonstrates normal coronary origin with RIGHT dominance.    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 Pt was hypoglycemic today with slight nervous shakes which was treated with D50.   Past Medical History: No date: Anxiety 08/05/2019: Carotid artery stenosis     Comment:  right No date: Depression No date: Fibromyalgia No date: GERD (gastroesophageal reflux disease) No date: Hyperlipidemia No date: Hypertension 2019: Osteoarthritis of left hip No date: Pneumonia     Comment:  01/16/2021 and 06/05/2021 No date: Pre-diabetes 08/05/2019: Stroke [facility])     Comment:  occlusion of right middle cerebral artery  Reproductive/Obstetrics                              Patient identified by MRN, date of birth, ID band Patient awake    Reviewed: Allergy & Precautions, H&P , NPO  status , Patient's Chart, lab work & pertinent test results, reviewed documented beta blocker date and time   History of Anesthesia Complications Negative for: history of anesthetic  complications  Airway Mallampati: I  TM Distance: >3 FB Neck ROM: full    Dental  (+) Dental Advidsory Given, Missing, Chipped, Poor Dentition, Teeth Intact   Pulmonary neg shortness of breath, neg sleep apnea, neg COPD, neg recent URI, Current Smoker,           Cardiovascular Exercise Tolerance: Good hypertension, (-) angina(-) CAD, (-) Past MI, (-) Cardiac Stents and (-) CABG (-) dysrhythmias (-) Valvular Problems/Murmurs     Neuro/Psych neg Seizures PSYCHIATRIC DISORDERS Anxiety  Neuromuscular disease (fibromyalgia)    GI/Hepatic Neg liver ROS, GERD  ,  Endo/Other  neg diabetesMorbid obesity  Renal/GU negative Renal ROS  negative genitourinary   Musculoskeletal   Abdominal   Peds  Hematology negative hematology ROS (+)   Anesthesia Other Findings Past Medical History: No date: Anxiety No date: Fibromyalgia No date: Hypertension 2019: Osteoarthritis of left hip   Reproductive/Obstetrics negative OB ROS                             Anesthesia Physical Anesthesia Plan  ASA: III  Anesthesia Plan: Spinal   Post-op Pain Management:    Induction: Intravenous  PONV Risk Score and Plan: 1  Airway Management Planned: Simple Face Mask and Natural Airway  Additional Equipment:   Intra-op Plan:   Post-operative Plan:   Informed Consent: I have reviewed the patients History and Physical, chart, labs and discussed the procedure including the risks, benefits and alternatives for the proposed anesthesia with the patient or authorized representative who has indicated his/her understanding and acceptance.   Dental Advisory Given  Plan Discussed with: Anesthesiologist, CRNA and Surgeon  Anesthesia Plan Comments:         Anesthesia Quick Evaluation  Anesthesia Physical Anesthesia Plan  ASA: 2  Anesthesia Plan: General   Post-op Pain Management: Regional block*   Induction: Intravenous  PONV Risk Score and  Plan: 3 and Ondansetron and Dexamethasone  Airway Management Planned: Oral ETT  Additional Equipment: None  Intra-op Plan:   Post-operative Plan: Extubation in OR  Informed Consent: I have reviewed the patients History and Physical, chart, labs and discussed the procedure including the risks, benefits and alternatives for the proposed anesthesia with the patient or authorized representative who has indicated his/her understanding and acceptance.       Plan Discussed with: CRNA and Surgeon  Anesthesia Plan Comments:          Anesthesia Quick Evaluation

## 2023-08-23 NOTE — Transfer of Care (Signed)
Immediate Anesthesia Transfer of Care Note  Patient: Terri Wood  Procedure(s) Performed: REVERSE SHOULDER ARTHROPLASTY WITH BICEPS TENODESIS (Right: Shoulder)  Patient Location: PACU  Anesthesia Type:General  Level of Consciousness: awake, alert , and oriented  Airway & Oxygen Therapy: Patient Spontanous Breathing and Patient connected to face mask oxygen  Post-op Assessment: Report given to RN and Post -op Vital signs reviewed and stable  Post vital signs: Reviewed and stable  Last Vitals:  Vitals Value Taken Time  BP 101/44 08/23/23 1007  Temp    Pulse 65 08/23/23 1010  Resp 17 08/23/23 1010  SpO2 100 % 08/23/23 1010  Vitals shown include unfiled device data.  Last Pain:  Vitals:   08/23/23 0635  TempSrc: Tympanic  PainSc: 4       Patients Stated Pain Goal: 0 (08/23/23 0981)  Complications: No notable events documented.

## 2023-08-23 NOTE — Anesthesia Procedure Notes (Signed)
Procedure Name: Intubation Date/Time: 08/23/2023 7:42 AM  Performed by: Nelle Don, CRNAPre-anesthesia Checklist: Patient identified, Emergency Drugs available, Suction available and Patient being monitored Patient Re-evaluated:Patient Re-evaluated prior to induction Oxygen Delivery Method: Circle system utilized Preoxygenation: Pre-oxygenation with 100% oxygen Induction Type: IV induction Ventilation: Mask ventilation without difficulty Laryngoscope Size: McGraph and 3 Grade View: Grade I Tube type: Oral Tube size: 7.0 mm Number of attempts: 1 Airway Equipment and Method: Stylet and Video-laryngoscopy Placement Confirmation: ETT inserted through vocal cords under direct vision, positive ETCO2 and breath sounds checked- equal and bilateral Secured at: 20 cm Tube secured with: Tape Dental Injury: Teeth and Oropharynx as per pre-operative assessment  Comments: Elective mcgraph

## 2023-08-23 NOTE — Op Note (Addendum)
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  Post-Op Diagnosis:   Same  Procedure:   Reverse right total shoulder arthroplasty with biceps tenodesis.  Surgeon:   Maryagnes Amos, MD  Assistant:   Horris Latino, PA-C  Anesthesia:   General endotracheal with an interscalene block using Exparel placed preoperatively by the anesthesiologist.  Findings:   As above.  Complications:   None  EBL:   75 cc  Fluids:   1000 cc crystalloid  UOP:   None  TT:   None  Drains:   None  Closure:   Staples  Implants:   All press-fit [employer] AETOS system with a size 1 small Meta humeral stem, a 34 mm +3 mm liner, a centered baseplate, and a 34 mm concentric +1 mm laterally offset glenosphere.  Brief Clinical Note:   The patient is a 57 year old female with a history of progressively worsening pain and weakness of her right shoulder. Her symptoms have persisted despite medications, activity modification, etc. Her history and examination are consistent with a massive rotator cuff tear and early cuff arthropathy, all of which were confirmed by preoperative MRI scanning. The patient presents at this time for a reverse right total shoulder arthroplasty with biceps tenodesis.  Procedure:   The patient underwent placement of an interscalene block using Exparel by the anesthesiologist in the preoperative holding area before being brought into the operating room and lain in the supine position. The patient then underwent general endotracheal intubation and anesthesia before the patient was repositioned in the beach chair position using the beach chair positioner. The right shoulder and upper extremity were prepped with ChloraPrep solution before being draped sterilely. Preoperative antibiotics were administered.   A timeout was performed to verify the appropriate  surgical site before a standard anterior approach to the shoulder was made through an approximately 4-5 inch incision. The incision was carried down through the subcutaneous tissues to expose the deltopectoral fascia. The interval between the deltoid and pectoralis muscles was identified and this plane developed, retracting the cephalic vein laterally with the deltoid muscle. The conjoined tendon was identified. Its lateral margin was dissected and the Kolbel self-retraining retractor inserted. The "three sisters" were identified and cauterized. Bursal tissues were removed to improve visualization.   The biceps tendon was identified near the inferior aspect of the bicipital groove. A soft tissue tenodesis was performed by attaching the biceps tendon to the adjacent pectoralis major tendon using two #0 Ethibond interrupted sutures. The biceps tendon was then transected just proximal to the tenodesis site. The subscapularis tendon was released from its attachment to the lesser tuberosity 1 cm proximal to its insertion and several tagging sutures placed. The inferior capsule was released with care after identifying and protecting the axillary nerve. The proximal humeral cut was made at approximately 20 of retroversion using the extra-medullary guide.   Attention was directed to the humeral side. The humeral cut surface was sized and found to be optimally replicated by the #1 sized component.  The central guide wire was inserted before the metaphyseal region was reamed with the appropriate reamer.  The punch was then inserted over the guidewire before the keel was inserted to create the proper position for the fins.  The trial #1 metaphyseal humeral stem was inserted, then covered with the appropriate cover.   Attention was redirected to the  glenoid. The labrum was debrided circumferentially before the center of the glenoid was identified. The guidewire was drilled into the glenoid neck using the appropriate  guide. After verifying its position, it was overreamed with the mini-baseplate reamer to create a flat surface before the stem reamer was utilized. The permanent mini-baseplate was impacted into place. It was stabilized with a 30 x 4.5 mm central screw and four peripheral locking screws. The permanent 34 mm concentric glenosphere with +1 mm of lateral offset was then impacted into place and its Morse taper locking mechanism verified using manual distraction.  The central set screw was then advanced and tightened securely.  Returning to the humeral side, the cover plate was removed and first the +0 mm insert and then the +3 mm insert was positioned. The shoulder was then reduced and placed through a range of motion. With the +3 mm insert, the arm demonstrated excellent range of motion as the hand could be brought across the chest to the opposite shoulder and brought to the top of the patient's head and to the patient's ear. The shoulder appeared stable throughout this range of motion. The joint was dislocated and the trial components removed. The permanent size 1 Meta humeral stem was impacted into place with care taken to maintain the appropriate version and valgus alignment. The permanent 34 mm +3 mm insert was impacted into place. After verifying its locking mechanism, the shoulder was relocated using two finger pressure and again placed through a range of motion with the findings as described above.  The wound was copiously irrigated with sterile saline solution using the jet lavage system before a total of 30 cc of 0.5% Sensorcaine with epinephrine was injected into the pericapsular and peri-incisional tissues to help with postoperative analgesia. The subscapularis tendon was reapproximated using #2 FiberWire interrupted sutures. The deltopectoral interval was closed using #0 Vicryl interrupted sutures before the subcutaneous tissues were closed using 2-0 Vicryl interrupted sutures. The skin was closed using  staples. A sterile occlusive dressing was applied to the wound before the arm was placed into a shoulder immobilizer with an abduction pillow. A Polar Care system also was applied to the shoulder. The patient was then transferred back to a hospital bed before being awakened, extubated, and returned to the recovery room in satisfactory condition after tolerating the procedure well.

## 2023-08-23 NOTE — Anesthesia Procedure Notes (Signed)
Anesthesia Regional Block: Interscalene brachial plexus block   Pre-Anesthetic Checklist: , timeout performed,  Correct Patient, Correct Site, Correct Laterality,  Correct Procedure, Correct Position, site marked,  Risks and benefits discussed,  Surgical consent,  Pre-op evaluation,  At surgeon's request and post-op pain management  Laterality: Upper and Right  Prep: chloraprep       Needles:  Injection technique: Single-shot  Needle Type: Stimiplex     Needle Length: 9cm  Needle Gauge: 22     Additional Needles:   Procedures:,,,, ultrasound used (permanent image in chart),,    Narrative:  Start time: 08/23/2023 7:27 AM End time: 08/23/2023 7:29 AM Injection made incrementally with aspirations every 5 mL.  Performed by: Personally  Anesthesiologist: Foye Deer, MD  Additional Notes: Patient consented for risk and benefits of nerve block including but not limited to nerve damage, failed block, bleeding and infection.  Patient voiced understanding.  Functioning IV was confirmed and monitors were applied.  Timeout done prior to procedure and prior to any sedation being given to the patient.  Patient confirmed procedure site prior to any sedation given to the patient. Sterile prep,hand hygiene and sterile gloves were used.  Minimal sedation used for procedure.  No paresthesia endorsed by patient during the procedure.  Negative aspiration and negative test dose prior to incremental administration of local anesthetic. The patient tolerated the procedure well with no immediate complications.

## 2023-08-23 NOTE — H&P (Signed)
History of Present Illness:  Terri Wood is a 57 y.o. female who presents for follow-up of her right shoulder pain secondary to impingement/tendinopathy with an MRI documented full-thickness tear of her rotator cuff. The patient was last seen for these symptoms 8 weeks ago. At the time, the patient did not wish to consider surgical intervention so she was started in formal physical therapy. However, she notes that these exercises only aggravated her symptoms. She continues to note moderate to severe pain in her shoulder which she rates at 7/10. Her symptoms are worse with activities at or above shoulder level, as well as when trying to reach behind her back. She also has pain at night and is unable to sleep on her right side. She denies any reinjury to the shoulder, and denies any numbness or paresthesias down her arm to her hand. She is ready to discuss more aggressive treatment options at this time.  Current Outpatient Medications:  AMIOdarone (PACERONE) 200 MG tablet TAKE 4 TABLETS/DAY FOR 1 WEEK, THEN 3 TABLETS/DAY FOR 1 WEEK, THEN 2 TABLETS/DAY FOR 1 WEEK. THEN FOLLOW UP.  lisinopriL-hydroCHLOROthiazide (ZESTORETIC) 10-12.5 mg tablet Take 1 tablet by mouth once daily  metoprolol succinate (TOPROL-XL) 25 MG XL tablet Take 1 tablet by mouth once daily  rivaroxaban (XARELTO) 20 mg tablet Take 20 mg by mouth daily with dinner  acetaminophen (TYLENOL) 500 MG tablet Take 1 tablet by mouth 3 (three) times a day  amLODIPine (NORVASC) 10 MG tablet Take by mouth  aspirin 325 MG EC tablet Take 1 tablet by mouth once daily  atorvastatin (LIPITOR) 40 MG tablet Take 1 tablet by mouth once daily  azelastine (ASTELIN) 137 mcg nasal spray PLACE 2 SPRAYS INTO BOTH NOSTRILS 2 (TWO) TIMES DAILY. USE IN EACH NOSTRIL AS DIRECTED  baclofen (LIORESAL) 10 MG tablet Take 1 tablet (10 mg total) by mouth at bedtime as needed  buPROPion (WELLBUTRIN XL) 150 MG XL tablet Take 150 mg by mouth every morning  celecoxib  (CELEBREX) 200 MG capsule Take 200 mg by mouth once daily  cholecalciferol, vitamin D3, 10 mcg (400 unit) Cap Take 1 capsule by mouth once daily  cloNIDine HCL (CATAPRES) 0.1 MG tablet  clopidogreL (PLAVIX) 75 mg tablet Take 75 mg by mouth once daily  cyanocobalamin, vitamin B-12, 1,000 mcg/mL Drop Take 1,000 mcg by mouth once daily  diclofenac (VOLTAREN) 1 % topical gel Apply topically 3 (three) times daily as needed  DULoxetine (CYMBALTA) 60 MG DR capsule Take 1 capsule (60 mg total) by mouth once daily 30 capsule 5  escitalopram oxalate (LEXAPRO) 10 MG tablet Take 1 tablet by mouth once daily  gabapentin (NEURONTIN) 600 MG tablet Take 600 mg by mouth 3 (three) times daily  GEMTESA 75 mg Tab once daily  icosapent ethyL (VASCEPA) 1 gram capsule Take 2 capsules by mouth 2 (two) times daily  lisinopriL (ZESTRIL) 10 MG tablet Take 1 tablet (10 mg total) by mouth at bedtime  methocarbamoL (ROBAXIN) 500 MG tablet Take 1 tablet (500 mg total) by mouth 4 (four) times daily 60 tablet 0  metoprolol tartrate (LOPRESSOR) 50 MG tablet Take 1 tablet by mouth 2 (two) times daily  MULTIVITS,CA,MINERALS/IRON/FA (ONE-A-DAY WOMENS FORMULA ORAL) Take 1 tablet by mouth once daily  omeprazole (PRILOSEC) 40 MG DR capsule Take 1 capsule by mouth once daily  ondansetron (ZOFRAN-ODT) 4 MG disintegrating tablet Take 4 mg by mouth every 8 (eight) hours as needed  OZEMPIC 0.25 mg or 0.5 mg(2 mg/1.5 mL) pen injector as directed  pantoprazole (PROTONIX) 40 MG DR tablet Take 40 mg by mouth once daily  pregabalin (LYRICA) 150 MG capsule Take 1 capsule by mouth 2 (two) times daily  SENNA PLUS 8.6-50 mg tablet Take 1 tablet by mouth 2 (two) times daily  tirzepatide 12.5 mg/0.5 mL PnIj Inject 12.5 mg subcutaneously every 7 (seven) days  tirzepatide 15 mg/0.5 mL PnIj Inject 15 mg subcutaneously every 7 (seven) days  vortioxetine (TRINTELLIX) 5 mg tablet Take 1 tablet (5 mg total) by mouth once daily   Allergies:   Levofloxacin - Dizziness  Oxycodone - Itching  Penicillin - Rash and Hives  Vilazodone Other (See Comments)   Past Medical History:  Acute CVA (cerebrovascular accident) (CMS/HHS-HCC) 08/15/2019  Acute hip pain, left 02/28/2018  Acute respiratory failure (CMS/HHS-HCC) 01/16/2021  Anxiety  Avascular necrosis of left femoral head (CMS/HHS-HCC) 04/09/2018  Body mass index 40.0-44.9, adult (CMS/HHS-HCC) 02/11/2016  Carotid artery stenosis 08/05/2019  Note: right - near complete  Cerebrovascular accident (CVA) due to occlusion of right middle cerebral artery (CMS/HHS-HCC) 05/03/2020  Cerebrovascular disease 08/05/2019  Note: right MCA blockage  Chronic midline low back pain with right-sided sciatica 02/11/2016  Community acquired pneumonia 01/16/2021  CVA, old, hemiparesis (CMS/HHS-HCC) 01/16/2021  Depression  Depression, major, single episode, complete remission (CMS-HCC) 11/05/2015  Depressive disorder 02/02/2015  Essential hypertension 02/11/2016  Fibromyalgia  Foraminal stenosis of lumbar region 02/27/2018  Generalized osteoarthrosis, involving multiple sites 08/07/2015  Hyperkalemia 09/13/2017  Hyperlipidemia 04/27/2021  Hypertension  Joint pain  Lumbar radiculopathy 12/15/2016  Major depressive disorder, recurrent episode, moderate (CMS/HHS-HCC) 11/08/2018  Morbid obesity (CMS/HHS-HCC) 01/16/2021  Myalgia and myositis 11/08/2018  Numbness and tingling in both hands 02/11/2016  Osteoarthritis  Pain, joint, multiple sites 02/28/2018  Perimenopausal symptoms 12/15/2016  Prediabetes 04/07/2019  Note: Unchanged  Primary osteoarthritis of right knee 08/07/2017  Restless leg syndrome  Restless legs syndrome 02/02/2015  Sciatica 06/25/2015   Past Surgical History:  GASTRIC BYPASS OPEN 1996  TARSAL TUNNEL RELEASE 2009  Total hip arhtroplasty anterior approach Left 04/09/2018 (Dr. Rosita Kea)  ARTHROPLASTY TOTAL KNEE Right 07/12/2021 (Dr. Rosita Kea)  ARTHROPLASTY TOTAL KNEE Left 02/16/2022 (Dr. Rosita Kea)  Right  Plantar Fascia surgery   Family History:  Osteoarthritis Mother  High blood pressure (Hypertension) Mother  Arthritis Father  Colon cancer Paternal Grandfather   Social History:   Socioeconomic History:  Marital status: Unknown  Occupational History  Occupation: Engineer, water  Tobacco Use  Smoking status: Former  Current packs/day: 1.00  Types: Cigarettes  Smokeless tobacco: Never  Vaping Use  Vaping status: Former  Substance and Sexual Activity  Alcohol use: Not Currently  Alcohol/week: 3.0 standard drinks of alcohol  Types: 3 Cans of beer per week  Drug use: No  Sexual activity: Not Currently   Social Determinants of Health   Financial Resource Strain: Low Risk (01/16/2022)  Overall Financial Resource Strain (CARDIA)  Difficulty of Paying Living Expenses: Not very hard  Food Insecurity: Unknown (01/16/2022)  Hunger Vital Sign  Worried About Running Out of Food in the Last Year: Never true  Transportation Needs: No Transportation Needs (01/16/2022)  PRAPARE - Risk analyst (Medical): No  Lack of Transportation (Non-Medical): No   Review of Systems:  A comprehensive 14 point ROS was performed, reviewed, and the pertinent orthopaedic findings are documented in the HPI.  Physical Exam: Vitals:  07/27/23 1055 07/27/23 1057  BP: 120/80  Weight: (!) 102.1 kg (225 lb)  Height: 152.4 cm (5')  PainSc: 7 7  PainLoc: Shoulder Shoulder   General/Constitutional:  Pleasant significantly overweight middle-age female in no acute distress. Neuro/Psych: Normal mood and affect, oriented to person, place and time. Eyes: Non-icteric. Pupils are equal, round, and reactive to light, and exhibit synchronous movement. ENT: Unremarkable. Lymphatic: No palpable adenopathy. Respiratory: Lungs clear to auscultation, Normal chest excursion, No wheezes, and Non-labored breathing Cardiovascular: Regular rate and rhythm. No murmurs. and No edema, swelling or tenderness, except  as noted in detailed exam. Integumentary: No impressive skin lesions present, except as noted in detailed exam. Musculoskeletal: Unremarkable, except as noted in detailed exam.  Right shoulder exam: SKIN: Normal SWELLING: None WARMTH: None LYMPH NODES: No adenopathy palpable CREPITUS: None TENDERNESS: Mildly tender over anterior shoulder region ROM (active):  Forward flexion: 95 degrees Abduction: 100 degrees Internal rotation: Right buttock ROM (passive):  Forward flexion: 125 degrees Abduction: 120 degrees  ER/IR at 90 abd: 90 degrees/50 degrees  She describes moderate pain with forward flexion, abduction, and internal rotation.  STRENGTH: Forward flexion: 4-4+/5 Abduction: 4-4+/5 External rotation: 4-4+/5 Internal rotation: 4+/5 Pain with RC testing: Mild pain with resisted forward flexion and abduction more so than with resisted external rotation.  STABILITY: Normal  SPECIAL TESTS: Juanetta Gosling' test: Moderately positive Speed's test: Positive Capsulitis - pain w/ passive ER: No Crossed arm test: Mildly positive Crank: Not evaluated Anterior apprehension: Negative Posterior apprehension: Not evaluated  She remains neurovascularly intact to the right upper extremity and hand.  Imaging:  Shoulder X-Ray Imaging: Recent true AP, Y-scapular, and axillary views of the right shoulder are available for review and have been reviewed by myself. These films demonstrate no evidence for fractures, lytic lesions, or significant degenerative changes. The subacromial space is mildly decreased. There is no subacromial or infra-clavicular spurring. She demonstrates a Type I-II acromion.  Right Shoulder Imaging, MRI: MRI Shoulder Cartilage: No cartilage abnormality. MRI Shoulder Rotator Cuff: Full thickness tear of the supraspinatus. Retracted to the glenohumeral joint. Partial-thickness tears involving anterior portion of infraspinatus and superior portion of the subscapularis tendons also  are noted. MRI Shoulder Labrum / Biceps: Biceps tendinopathy. MRI Shoulder Bone: Normal bone.  Both the films and report were reviewed by myself and discussed with the patient.   Assessment: 1. Traumatic complete tear of right rotator cuff. 2. Tendinitis of upper biceps tendon of right shoulder.  3. Rotator cuff tendinitis, right.   Plan: The treatment options were discussed with the patient and her mother. In addition, patient educational materials were provided regarding the diagnosis and treatment options. Regarding her right shoulder , the patient is quite frustrated by her persistent symptoms and functional limitations, and is ready to consider more aggressive treatment options. Therefore, I have recommended a surgical procedure, specifically a reverse right total shoulder arthroplasty with biceps tenodesis. The procedure was discussed with the patient, as were the potential risks (including bleeding, infection, nerve and/or blood vessel injury, persistent or recurrent pain, loosening and/or failure of the components, dislocation, need for further surgery, blood clots, strokes, heart attacks and/or arhythmias, pneumonia, etc.) and benefits. The patient states his/her understanding and wishes to proceed. All of the patient's questions and concerns were answered. She can call any time with further concerns. She will follow up post-surgery, routine.    H&P reviewed and patient re-examined. No changes.

## 2023-08-23 NOTE — Anesthesia Postprocedure Evaluation (Signed)
Anesthesia Post Note  Patient: Ladina Shutters Kole  Procedure(s) Performed: REVERSE SHOULDER ARTHROPLASTY WITH BICEPS TENODESIS (Right: Shoulder)  Patient location during evaluation: PACU Anesthesia Type: General Level of consciousness: awake and alert Pain management: pain level controlled Vital Signs Assessment: post-procedure vital signs reviewed and stable Respiratory status: spontaneous breathing, nonlabored ventilation and respiratory function stable Cardiovascular status: blood pressure returned to baseline and stable Postop Assessment: no apparent nausea or vomiting Anesthetic complications: no   No notable events documented.   Last Vitals:  Vitals:   08/23/23 1030 08/23/23 1054  BP: (!) 118/93 94/60  Pulse: 68 69  Resp: 20 18  Temp: (!) 36.3 C (!) 36.4 C  SpO2: 97% 95%    Last Pain:  Vitals:   08/23/23 1054  TempSrc: Temporal  PainSc: 0-No pain                 Foye Deer

## 2023-08-23 NOTE — Evaluation (Addendum)
Occupational Therapy Evaluation Patient Details Name: Terri Wood MRN: [id number] DOB: September 19, 1966 Today's Date: 08/23/2023   History of Present Illness Terri Wood is a 57 y.o. female who presents for follow-up of her right shoulder pain secondary to impingement/tendinopathy with an MRI documented full-thickness tear of her rotator cuff. S/p R RSA on 08/23/23. PMH: CVA with mild residual L weakness, HTN   Clinical Impression   Terri Wood was seen for OT evaluation this date. Prior to hospital admission, pt was IND. Pt lives with her mother. Pt currently requires MAX A don/doff dress, polar care, and sling in sititng. MIN A don underwear, assist for pulling up over rear. SUPERVISION for ADL transfer and navigating x4 stairs. Pt instructed in polar care mgt, compression stockings mgt, sling/immobilizer mgt, ROM exercises for RUE (with instructions for no shoulder exercises until full sensation has returned), RUE precautions, adaptive strategies for bathing/dressing/toileting/grooming, positioning and considerations for sleep, and home/routines modifications to maximize falls prevention, safety, and independence. Handout provided. All education complete, will sign off. Upon hospital discharge, recommend no OT follow up.     If plan is discharge home, recommend the following: A lot of help with bathing/dressing/bathroom;Help with stairs or ramp for entrance    Functional Status Assessment  Patient has had a recent decline in their functional status and demonstrates the ability to make significant improvements in function in a reasonable and predictable amount of time.  Equipment Recommendations  None recommended by OT    Recommendations for Other Services       Precautions / Restrictions Precautions Precautions: Fall;Shoulder Shoulder Interventions: Shoulder sling/immobilizer;Shoulder abduction pillow;Off for dressing/bathing/exercises Precaution Booklet Issued: Yes (comment) Required  Braces or Orthoses: Sling Restrictions Weight Bearing Restrictions: Yes RUE Weight Bearing: Non weight bearing      Mobility Bed Mobility               General bed mobility comments: not tested    Transfers Overall transfer level: Needs assistance Equipment used: None Transfers: Sit to/from Stand Sit to Stand: Supervision                  Balance Overall balance assessment: Needs assistance Sitting-balance support: No upper extremity supported, Feet supported Sitting balance-Leahy Scale: Good     Standing balance support: No upper extremity supported, During functional activity Standing balance-Leahy Scale: Fair                             ADL either performed or assessed with clinical judgement   ADL Overall ADL's : Needs assistance/impaired                                       General ADL Comments: MAX A don/doff dress, polar care, and sling in sititng. MIN A don underwear, assist for pulling up over rear      Pertinent Vitals/Pain Pain Assessment Pain Assessment: No/denies pain     Extremity/Trunk Assessment Upper Extremity Assessment Upper Extremity Assessment: Right hand dominant;RUE deficits/detail RUE: Unable to fully assess due to immobilization   Lower Extremity Assessment Lower Extremity Assessment: Overall WFL for tasks assessed       Communication Communication Communication: No apparent difficulties   Cognition Arousal: Alert Behavior During Therapy: WFL for tasks assessed/performed Overall Cognitive Status: Within Functional Limits for tasks assessed  Home Living Family/patient expects to be discharged to:: Private residence Living Arrangements: Parent Available Help at Discharge: Family;Available 24 hours/day Type of Home: House Home Access: Ramped entrance     Home Layout: One level     Bathroom Shower/Tub: Tub/shower  unit         Home Equipment: Agricultural consultant (2 wheels);Cane - single point          Prior Functioning/Environment Prior Level of Function : Independent/Modified Independent                        OT Problem List: Decreased range of motion;Decreased activity tolerance;Impaired UE functional use         OT Goals(Current goals can be found in the care plan section) Acute Rehab OT Goals Patient Stated Goal: to go home OT Goal Formulation: With patient/family Time For Goal Achievement: 09/06/23 Potential to Achieve Goals: Good   AM-PAC OT "6 Clicks" Daily Activity     Outcome Measure Help from another person eating meals?: None Help from another person taking care of personal grooming?: None Help from another person toileting, which includes using toliet, bedpan, or urinal?: A Little Help from another person bathing (including washing, rinsing, drying)?: A Lot Help from another person to put on and taking off regular upper body clothing?: A Lot Help from another person to put on and taking off regular lower body clothing?: A Little 6 Click Score: 18   End of Session Nurse Communication: Mobility status  Activity Tolerance: Patient tolerated treatment well Patient left: in chair;with call bell/phone within reach  OT Visit Diagnosis: Unsteadiness on feet (R26.81)                Time: 1236-1310 OT Time Calculation (min): 34 min Charges:  OT General Charges $OT Visit: 1 Visit OT Evaluation $OT Eval Low Complexity: 1 Low OT Treatments $Self Care/Home Management : 8-22 mins  Terri Wood, M.S. OTR/L  08/23/23, 2:58 PM  ascom [phone]

## 2023-08-23 NOTE — Discharge Instructions (Addendum)
Orthopedic discharge instructions: May shower with intact OpSite dressing once nerve block has worn off (around Monday).  Apply ice frequently to shoulder or use Polar Care device. Take hydrocodone as prescribed when needed.  May supplement with ES Tylenol if necessary. May resume Xarelto as prescribed tomorrow. Keep shoulder immobilizer on at all times except may remove for bathing purposes. Follow-up in 10-14 days or as scheduled.  POLAR CARE INFORMATION  MassAdvertisement.it  How to use [facility] Polar Care [facility] Therapy System?  YouTube   ShippingScam.co.uk  OPERATING INSTRUCTIONS  Start the product With dry hands, connect the transformer to the electrical connection located on the top of the cooler. Next, plug the transformer into an appropriate electrical outlet. The unit will automatically start running at this point.  To stop the pump, disconnect electrical power.  Unplug to stop the product when not in use. Unplugging the Polar Care unit turns it off. Always unplug immediately after use. Never leave it plugged in while unattended. Remove pad.    FIRST ADD WATER TO FILL LINE, THEN ICE---Replace ice when existing ice is almost melted  1 Discuss Treatment with your Licensed Health Care Practitioner and Use Only as Prescribed 2 Apply Insulation Barrier & Cold Therapy Pad 3 Check for Moisture 4 Inspect Skin Regularly  Tips and Trouble Shooting Usage Tips 1. Use cubed or chunked ice for optimal performance. 2. It is recommended to drain the Pad between uses. To drain the pad, hold the Pad upright with the hose pointed toward the ground. Depress the black plunger and allow water to drain out. 3. You may disconnect the Pad from the unit without removing the pad from the affected area by depressing the silver tabs on the hose coupling and gently pulling the hoses apart. The Pad and unit will seal itself and will not leak. Note: Some dripping during release is  normal. 4. DO NOT RUN PUMP WITHOUT WATER! The pump in this unit is designed to run with water. Running the unit without water will cause permanent damage to the pump. 5. Unplug unit before removing lid.  TROUBLESHOOTING GUIDE Pump not running, Water not flowing to the pad, Pad is not getting cold 1. Make sure the transformer is plugged into the wall outlet. 2. Confirm that the ice and water are filled to the indicated levels. 3. Make sure there are no kinks in the pad. 4. Gently pull on the blue tube to make sure the tube/pad junction is straight. 5. Remove the pad from the treatment site and ll it while the pad is lying at; then reapply. 6. Confirm that the pad couplings are securely attached to the unit. Listen for the double clicks (Figure 1) to confirm the pad couplings are securely attached.  Leaks    Note: Some condensation on the lines, controller, and pads is unavoidable, especially in warmer climates. 1. If using a Breg Polar Care Cold Therapy unit with a detachable Cold Therapy Pad, and a leak exists (other than condensation on the lines) disconnect the pad couplings. Make sure the silver tabs on the couplings are depressed before reconnecting the pad to the pump hose; then confirm both sides of the coupling are properly clicked in. 2. If the coupling continues to leak or a leak is detected in the pad itself, stop using it and call B[employer] Customer Care at [phone].  Cleaning After use, empty and dry the unit with a soft cloth. Warm water and mild detergent may be used occasionally to  clean the pump and tubes.  WARNING: The Polar Care Cube can be cold enough to cause serious injury, including full skin necrosis. Follow these Operating Instructions, and carefully read the Product Insert (see pouch on side of unit) and the Cold Therapy Pad Fitting Instructions (provided with each Cold Therapy Pad) prior to use.  SHOULDER SLING IMMOBILIZER   VIDEO Slingshot 2 Shoulder Brace  Application - YouTube ---https://www.porter.info/  INSTRUCTIONS While supporting the injured arm, slide the forearm into the sling. Wrap the adjustable shoulder strap around the neck and shoulders and attach the strap end to the sling using  the "alligator strap tab."  Adjust the shoulder strap to the required length. Position the shoulder pad behind the neck. To secure the shoulder pad location (optional), pull the shoulder strap away from the shoulder pad, unfold the hook material on the top of the pad, then press the shoulder strap back onto the hook material to secure the pad in place. Attach the closure strap across the open top of the sling. Position the strap so that it holds the arm securely in the sling. Next, attach the thumb strap to the open end of the sling between the thumb and fingers. After sling has been fit, it may be easily removed and reapplied using the quick release buckle on shoulder strap. If a neutral pillow or 15 abduction pillow is included, place the pillow at the waistline. Attach the sling to the pillow, lining up hook material on the pillow with the loop on sling. Adjust the waist strap to fit.  If waist strap is too long, cut it to fit. Use the small piece of double sided hook material (located on top of the pillow) to secure the strap end. Place the double sided hook material on the inside of the cut strap end and secure it to the waist strap.     If no pillow is included, attach the waist strap to the sling and adjust to fit.    Washing Instructions: Straps and sling must be removed and cleaned regularly depending on your activity level and perspiration. Hand wash straps and sling in cold water with mild detergent, rinse, air dry        Interscalene Nerve Block with Exparel   For your surgery you have received an Interscalene Nerve Block with Exparel. Nerve Blocks affect many types of nerves, including nerves that control movement, pain and  normal sensation.  You may experience feelings such as numbness, tingling, heaviness, weakness or the inability to move your arm or the feeling or sensation that your arm has "fallen asleep". A nerve block with Exparel can last up to 5 days.  Usually the weakness wears off first.  The tingling and heaviness usually wear off next.  Finally you may start to notice pain.  Keep in mind that this may occur in any order.  Once a nerve block starts to wear off it is usually completely gone within 60 minutes. ISNB may cause mild shortness of breath, a hoarse voice, blurry vision, unequal pupils, or drooping of the face on the same side as the nerve block.  These symptoms will usually resolve with the numbness.  Very rarely the procedure itself can cause mild seizures. If needed, your surgeon will give you a prescription for pain medication.  It will take about 60 minutes for the oral pain medication to become fully effective.  So, it is recommended that you start taking this medication before the nerve block first begins  to wear off, or when you first begin to feel discomfort. Take your pain medication only as prescribed.  Pain medication can cause sedation and decrease your breathing if you take more than you need for the level of pain that you have. Nausea is a common side effect of many pain medications.  You may want to eat something before taking your pain medicine to prevent nausea. After an Interscalene nerve block, you cannot feel pain, pressure or extremes in temperature in the effected arm.  Because your arm is numb it is at an increased risk for injury.  To decrease the possibility of injury, please practice the following:  While you are awake change the position of your arm frequently to prevent too much pressure on any one area for prolonged periods of time.  If you have a cast or tight dressing, check the color or your fingers every couple of hours.  Call your surgeon with the appearance of any  discoloration (white or blue). If you are given a sling to wear before you go home, please wear it  at all times until the block has completely worn off.  Do not get up at night without your sling. Please contact [facility] or your surgeon if you do not begin to regain sensation after 7 days from the surgery.  Anesthesia may be contacted by calling the Same Day Surgery Department, Mon. through Fri., 6 am to 4 pm at [phone].   If you experience any other problems or concerns, please contact your surgeon's office. If you experience severe or prolonged shortness of breath go to the nearest emergency department.

## 2023-08-24 ENCOUNTER — Other Ambulatory Visit: Payer: Self-pay | Admitting: Family

## 2023-08-24 ENCOUNTER — Encounter: Payer: Self-pay | Admitting: Surgery

## 2023-08-24 MED ORDER — CLOPIDOGREL BISULFATE 75 MG PO TABS: 75.0000 mg | ORAL_TABLET | Freq: Every day | ORAL | 1 refills | Status: DC

## 2023-08-27 ENCOUNTER — Ambulatory Visit: Payer: 59 | Admitting: Family

## 2023-08-27 DIAGNOSIS — Z96611 Presence of right artificial shoulder joint: Secondary | ICD-10-CM | POA: Insufficient documentation

## 2023-08-31 ENCOUNTER — Other Ambulatory Visit: Payer: Self-pay

## 2023-08-31 ENCOUNTER — Other Ambulatory Visit: Payer: Self-pay | Admitting: Family

## 2023-08-31 MED FILL — Tirzepatide Soln Auto-injector 15 MG/0.5ML: SUBCUTANEOUS | 28 days supply | Qty: 2 | Fill #0 | Status: AC

## 2023-09-03 ENCOUNTER — Other Ambulatory Visit: Payer: Self-pay

## 2023-09-04 ENCOUNTER — Encounter: Payer: Self-pay | Admitting: Family

## 2023-09-04 ENCOUNTER — Ambulatory Visit: Payer: 59 | Admitting: Family

## 2023-09-04 VITALS — BP 110/60 | HR 62 | Ht 61.0 in | Wt 236.0 lb

## 2023-09-04 DIAGNOSIS — R739 Hyperglycemia, unspecified: Secondary | ICD-10-CM

## 2023-09-04 DIAGNOSIS — E1165 Type 2 diabetes mellitus with hyperglycemia: Secondary | ICD-10-CM

## 2023-09-04 DIAGNOSIS — I679 Cerebrovascular disease, unspecified: Secondary | ICD-10-CM

## 2023-09-04 DIAGNOSIS — I10 Essential (primary) hypertension: Secondary | ICD-10-CM | POA: Diagnosis not present

## 2023-09-04 DIAGNOSIS — I63511 Cerebral infarction due to unspecified occlusion or stenosis of right middle cerebral artery: Secondary | ICD-10-CM

## 2023-09-04 DIAGNOSIS — I6523 Occlusion and stenosis of bilateral carotid arteries: Secondary | ICD-10-CM

## 2023-09-04 DIAGNOSIS — M797 Fibromyalgia: Secondary | ICD-10-CM

## 2023-09-04 DIAGNOSIS — E782 Mixed hyperlipidemia: Secondary | ICD-10-CM

## 2023-09-04 DIAGNOSIS — Z8679 Personal history of other diseases of the circulatory system: Secondary | ICD-10-CM

## 2023-09-04 LAB — GLUCOSE, POCT (MANUAL RESULT ENTRY): POC Glucose: 103 mg/dL — AB (ref 70–99)

## 2023-09-04 MED ORDER — PANTOPRAZOLE SODIUM 40 MG PO TBEC: 40.0000 mg | DELAYED_RELEASE_TABLET | Freq: Every day | ORAL | 1 refills | Status: DC

## 2023-09-04 MED ORDER — GEMTESA 75 MG PO TABS: 75.0000 mg | ORAL_TABLET | Freq: Every day | ORAL | 3 refills | Status: DC

## 2023-09-05 ENCOUNTER — Other Ambulatory Visit: Payer: Self-pay

## 2023-09-17 ENCOUNTER — Encounter: Payer: Self-pay | Admitting: Cardiovascular Disease

## 2023-09-17 ENCOUNTER — Ambulatory Visit (INDEPENDENT_AMBULATORY_CARE_PROVIDER_SITE_OTHER): Payer: 59 | Admitting: Cardiovascular Disease

## 2023-09-17 VITALS — BP 121/75 | HR 75 | Ht 61.0 in | Wt 230.2 lb

## 2023-09-17 DIAGNOSIS — I6523 Occlusion and stenosis of bilateral carotid arteries: Secondary | ICD-10-CM | POA: Diagnosis not present

## 2023-09-17 DIAGNOSIS — R42 Dizziness and giddiness: Secondary | ICD-10-CM | POA: Diagnosis not present

## 2023-09-17 DIAGNOSIS — I10 Essential (primary) hypertension: Secondary | ICD-10-CM

## 2023-09-17 DIAGNOSIS — E782 Mixed hyperlipidemia: Secondary | ICD-10-CM

## 2023-09-17 NOTE — Progress Notes (Signed)
Cardiology Office Note   Date:  09/17/2023   ID:  Terri Wood, DOB 1966/02/05, MRN [id number]  PCP:  Miki Kins, FNP  Cardiologist:  Adrian Blackwater, MD      History of Present Illness: Terri Wood is a 57 y.o. female who presents for  Chief Complaint  Patient presents with   Follow-up    Feels dizzy at times      Past Medical History:  Diagnosis Date   Acute ischemic right MCA stroke (HCC) 08/15/2019   Acute respiratory failure (HCC) 01/16/2021   a.) in setting of CAP   Anxiety    Aortic atherosclerosis (HCC)    Atrial fibrillation (HCC)    a.) CHA2DS2-VASc = 5 (sex, HTN, CVA x2, vascular disease history) as of 08/20/2023; b.) cardiac rate/rhythm maintained on oral amiodarone + metoprolol; chronically anticoagulated using rivaroxaban; on chronic clopidogrel   Avascular necrosis of left femoral head (HCC) 04/09/2018   CAD (coronary artery disease) 03/13/2023   a.) MV 03/13/2023: small mild reversible bas/mid anterosep/inferosep/apex defects c/w isch; b.) cCTA 03/29/2023: Ca2+ = 673.3 (99th %'ile)   Carotid artery disease (HCC) 08/16/2019   a.) doppler 08/16/2019: 40-59% LICA   Chronic midline low back pain with right-sided sciatica 02/11/2016   Community acquired pneumonia 01/16/2021   Depression    Essential hypertension    Fibromyalgia    Foraminal stenosis of lumbar region 02/27/2018   GERD (gastroesophageal reflux disease)    Long term current use of amiodarone    Lumbar radiculopathy 12/15/2016   Mixed hyperlipidemia    Multifocal pneumonia 06/05/2021   OAB (overactive bladder)    On chronic clopidogrel therapy    On rivaroxaban therapy    Osteoarthritis of left hip 2019   Restless leg syndrome 02/02/2015   Sepsis (HCC) 01/16/2021   T2DM (type 2 diabetes mellitus) (HCC)      Past Surgical History:  Procedure Laterality Date   ABDOMINAL SURGERY  1996   gastric bypass; stapling; surgilite   GASTRIC BYPASS OPEN  1996   PLANTAR FASCIA  SURGERY Right 2009   REVERSE SHOULDER ARTHROPLASTY Right 08/23/2023   Procedure: REVERSE SHOULDER ARTHROPLASTY WITH BICEPS TENODESIS;  Surgeon: Christena Flake, MD;  Location: [facility];  Service: Orthopedics;  Laterality: Right;   TARSAL TUNNEL RELEASE Right 2009   TOTAL HIP ARTHROPLASTY Left 04/09/2018   Procedure: TOTAL HIP ARTHROPLASTY ANTERIOR APPROACH;  Surgeon: Kennedy Bucker, MD;  Location: [facility];  Service: Orthopedics;  Laterality: Left;   TOTAL KNEE ARTHROPLASTY Right 07/12/2021   Procedure: TOTAL KNEE ARTHROPLASTY;  Surgeon: Kennedy Bucker, MD;  Location: [facility];  Service: Orthopedics;  Laterality: Right;   TOTAL KNEE ARTHROPLASTY Left 02/16/2022   Procedure: TOTAL KNEE ARTHROPLASTY;  Surgeon: Kennedy Bucker, MD;  Location: [facility];  Service: Orthopedics;  Laterality: Left;     Current Outpatient Medications  Medication Sig Dispense Refill   ACCU-CHEK GUIDE test strip USE TO CHECK BLOOD GLUCOSE ONCE DAILY 100 strip 1   amiodarone (PACERONE) 200 MG tablet Take 1 tablet (200 mg total) by mouth daily as needed.     Ascorbic Acid (VITAMIN C WITH ROSE HIPS) 1000 MG tablet Take 1,000 mg by mouth daily.     ASHWAGANDHA PO Take 1 capsule by mouth in the morning. Gummy     atorvastatin (LIPITOR) 80 MG tablet TAKE 1 TABLET BY MOUTH EVERY DAY FOR CHOLESTEROL 90 tablet 3   azelastine (ASTELIN) 0.1 % nasal spray Place 2 sprays into both nostrils 2 (  two) times daily. Use in each nostril as directed 30 mL 12   B Complex-C (B-COMPLEX WITH VITAMIN C) tablet Take 1 tablet by mouth daily.     BIOTIN PO Take 1 drop by mouth. With collagen     BLACK COHOSH EXTRACT PO Take 1 tablet by mouth daily.     buPROPion (WELLBUTRIN XL) 150 MG 24 hr tablet Take 150 mg by mouth in the morning.     Cholecalciferol (VITAMIN D3) 125 MCG (5000 UT) CAPS Take 5,000 Units by mouth daily.     cloNIDine (CATAPRES) 0.1 MG tablet Take 0.1 mg by mouth at bedtime.     clopidogrel (PLAVIX) 75 MG tablet Take 1 tablet (75 mg  total) by mouth daily. 90 tablet 1   DULoxetine (CYMBALTA) 60 MG capsule Take 1 capsule (60 mg total) by mouth 2 (two) times daily. 180 capsule 1   gabapentin (NEURONTIN) 600 MG tablet Take 1 tablet (600 mg total) by mouth 3 (three) times daily. Take 1 tablet (300 mg) by mouth in the morning & take 2 tablets (600 mg) by mouth at night.     GEMTESA 75 MG TABS Take 1 tablet (75 mg total) by mouth at bedtime. 30 tablet 3   HYDROcodone-acetaminophen (NORCO) 10-325 MG tablet Take 1 tablet by mouth every 4 (four) hours as needed for moderate pain (pain score 4-6) or severe pain (pain score 7-10) (pain). 40 tablet 0   Lactobacillus-Inulin (PROBIOTIC DIGESTIVE SUPPORT PO) Take by mouth.     lisinopril-hydrochlorothiazide (ZESTORETIC) 10-12.5 MG tablet Take 1 tablet by mouth daily with supper.     Magnesium 500 MG CAPS Take 500 mg by mouth in the morning.     metoprolol succinate (TOPROL-XL) 25 MG 24 hr tablet Take 1 tablet (25 mg total) by mouth daily. 90 tablet 1   metoprolol tartrate (LOPRESSOR) 50 MG tablet Take 50 mg by mouth 2 (two) times daily.     pantoprazole (PROTONIX) 40 MG tablet Take 1 tablet (40 mg total) by mouth daily. 90 tablet 1   polyethylene glycol (MIRALAX / GLYCOLAX) 17 g packet Take 17 g by mouth daily as needed for mild constipation. 14 each 0   rivaroxaban (XARELTO) 20 MG TABS tablet Take 1 tablet (20 mg total) by mouth daily with supper. 30 tablet 3   Sod Fluoride-Potassium Nitrate (PREVIDENT 5000 ENAMEL PROTECT) 1.1-5 % GEL Use paste to brush teeth 2 (two) times daily morning and night. Do not eat or drink 30 minutes after use. 100 mL 3   tirzepatide (MOUNJARO) 15 MG/0.5ML Pen Inject 15 mg into the skin once a week. 6 mL 1   VITAMIN E PO Take 1 capsule by mouth every evening.     vortioxetine HBr (TRINTELLIX) 5 MG TABS tablet Take 5 mg by mouth in the morning.     No current facility-administered medications for this visit.    Allergies:   Penicillins, Levofloxacin, Oxycodone,  Veozah [fezolinetant], and Vilazodone    Social History:   reports that she quit smoking about 4 years ago. Her smoking use included cigarettes. She has never used smokeless tobacco. She reports that she does not currently use alcohol. She reports that she does not currently use drugs after having used the following drugs: Marijuana.   Family History:  family history includes Aneurysm in her mother; Heart disease in her father.    ROS:     Review of Systems  Constitutional: Negative.   HENT: Negative.    Eyes: Negative.  Respiratory: Negative.    Gastrointestinal: Negative.   Genitourinary: Negative.   Musculoskeletal: Negative.   Skin: Negative.   Neurological: Negative.   Endo/Heme/Allergies: Negative.   Psychiatric/Behavioral: Negative.    All other systems reviewed and are negative.     All other systems are reviewed and negative.    PHYSICAL EXAM: VS:  BP 121/75   Pulse 75   Ht 5\' 1"  (1.549 m)   Wt 230 lb 3.2 oz (104.4 kg)   LMP 04/01/2018 (Exact Date)   SpO2 99%   BMI 43.50 kg/m  , BMI Body mass index is 43.5 kg/m. Last weight:  Wt Readings from Last 3 Encounters:  09/17/23 230 lb 3.2 oz (104.4 kg)  09/04/23 236 lb (107 kg)  08/23/23 230 lb (104.3 kg)     Physical Exam Constitutional:      Appearance: Normal appearance.  Cardiovascular:     Rate and Rhythm: Normal rate and regular rhythm.     Heart sounds: Normal heart sounds.  Pulmonary:     Effort: Pulmonary effort is normal.     Breath sounds: Normal breath sounds.  Musculoskeletal:     Right lower leg: No edema.     Left lower leg: No edema.  Neurological:     Mental Status: She is alert.       EKG:   Recent Labs: 01/08/2023: TSH 1.100 08/15/2023: ALT 24; BUN 18; Creatinine, Ser 0.99; Hemoglobin 13.1; Platelets 339; Potassium 4.3; Sodium 138    Lipid Panel    Component Value Date/Time   CHOL 112 04/27/2023 1001   TRIG 79 04/27/2023 1001   HDL 55 04/27/2023 1001   CHOLHDL 2.0  04/27/2023 1001   CHOLHDL 3.6 08/16/2019 0516   VLDL 26 08/16/2019 0516   LDLCALC 41 04/27/2023 1001      Other studies Reviewed: Additional studies/ records that were reviewed today include:  Review of the above records demonstrates:       No data to display            ASSESSMENT AND PLAN:    ICD-10-CM   1. Dizziness  R42    Holter recently was fine, has orthostatic hypotension as cause of it. Hold on to something getting up.    2. Essential hypertension, benign  I10     3. Bilateral carotid artery stenosis  I65.23     4. Mixed hyperlipidemia  E78.2        Problem List Items Addressed This Visit       Cardiovascular and Mediastinum   Essential hypertension, benign   Carotid artery stenosis     Other   Hyperlipidemia   Other Visit Diagnoses     Dizziness    -  Primary   Holter recently was fine, has orthostatic hypotension as cause of it. Hold on to something getting up.          Disposition:   Return in about 3 months (around 12/16/2023).    Total time spent: 30 minutes  Signed,  Adrian Blackwater, MD  09/17/2023 11:20 AM    [facility]

## 2023-09-19 ENCOUNTER — Other Ambulatory Visit: Payer: Self-pay

## 2023-09-25 ENCOUNTER — Other Ambulatory Visit: Payer: Self-pay

## 2023-09-25 MED FILL — Tirzepatide Soln Auto-injector 15 MG/0.5ML: SUBCUTANEOUS | 28 days supply | Qty: 2 | Fill #1 | Status: AC

## 2023-10-01 ENCOUNTER — Telehealth: Payer: Self-pay

## 2023-10-01 ENCOUNTER — Other Ambulatory Visit: Payer: Self-pay

## 2023-10-01 NOTE — Telephone Encounter (Signed)
Cala Bradford, a case worker, from [facility] called regarding this patient. She needs someone to call this patient and help her set up a mammogram for either December or first of next year. Patient phone number is [phone]

## 2023-10-11 ENCOUNTER — Other Ambulatory Visit: Payer: Self-pay | Admitting: Family

## 2023-10-11 MED ORDER — BUPROPION HCL ER (XL) 150 MG PO TB24: 150.0000 mg | ORAL_TABLET | Freq: Every morning | ORAL | 1 refills | Status: DC

## 2023-10-11 MED ORDER — DULOXETINE HCL 60 MG PO CPEP: 60.0000 mg | ORAL_CAPSULE | Freq: Two times a day (BID) | ORAL | 1 refills | Status: DC

## 2023-10-15 ENCOUNTER — Ambulatory Visit (INDEPENDENT_AMBULATORY_CARE_PROVIDER_SITE_OTHER): Payer: 59 | Admitting: Family

## 2023-10-15 ENCOUNTER — Encounter: Payer: Self-pay | Admitting: Family

## 2023-10-15 VITALS — BP 120/66 | HR 81 | Ht 61.0 in | Wt 230.0 lb

## 2023-10-15 DIAGNOSIS — J441 Chronic obstructive pulmonary disease with (acute) exacerbation: Secondary | ICD-10-CM

## 2023-10-15 DIAGNOSIS — Z0130 Encounter for examination of blood pressure without abnormal findings: Secondary | ICD-10-CM

## 2023-10-15 DIAGNOSIS — F331 Major depressive disorder, recurrent, moderate: Secondary | ICD-10-CM

## 2023-10-15 DIAGNOSIS — H6690 Otitis media, unspecified, unspecified ear: Secondary | ICD-10-CM

## 2023-10-15 DIAGNOSIS — Z1231 Encounter for screening mammogram for malignant neoplasm of breast: Secondary | ICD-10-CM

## 2023-10-15 MED ORDER — DOXYCYCLINE HYCLATE 100 MG PO CAPS: 100.0000 mg | ORAL_CAPSULE | Freq: Two times a day (BID) | ORAL | 0 refills | Status: DC

## 2023-10-15 NOTE — Telephone Encounter (Signed)
Will take care of today at her visit

## 2023-10-15 NOTE — Progress Notes (Signed)
Acute Office Visit  Subjective:     Patient ID: Terri Wood, female    DOB: 01/03/1966, 57 y.o.   MRN: [id number]  Patient is in today for  Chief Complaint  Patient presents with   Otalgia    Right ear ache, x 3 days.    Otalgia  There is pain in the right ear. This is a new problem. The current episode started in the past 7 days. The problem occurs constantly. The problem has been gradually worsening. There has been no fever. The pain is at a severity of 7/10. The pain is moderate. Associated symptoms include headaches and hearing loss. She has tried cold packs, heat packs, ear drops, NSAIDs and acetaminophen for the symptoms. The treatment provided no relief.     Review of Systems  HENT:  Positive for ear pain and hearing loss.   Neurological:  Positive for headaches.        Objective:    BP 120/66   Pulse 81   Ht 5\' 1"  (1.549 m)   Wt 230 lb (104.3 kg)   LMP 04/01/2018 (Exact Date)   SpO2 96%   BMI 43.46 kg/m   Physical Exam  No results found for any visits on 10/15/23.  Recent Results (from the past 2160 hours)  CBC WITH DIFFERENTIAL     Status: None   Collection Time: 08/15/23  9:07 AM  Result Value Ref Range   WBC 6.7 4.0 - 10.5 K/uL   RBC 4.22 3.87 - 5.11 MIL/uL   Hemoglobin 13.1 12.0 - 15.0 g/dL   HCT 41.6 60.6 - 30.1 %   MCV 95.7 80.0 - 100.0 fL   MCH 31.0 26.0 - 34.0 pg   MCHC 32.4 30.0 - 36.0 g/dL   RDW 60.1 09.3 - 23.5 %   Platelets 339 150 - 400 K/uL   nRBC 0.0 0.0 - 0.2 %   Neutrophils Relative % 62 %   Neutro Abs 4.2 1.7 - 7.7 K/uL   Lymphocytes Relative 25 %   Lymphs Abs 1.7 0.7 - 4.0 K/uL   Monocytes Relative 10 %   Monocytes Absolute 0.7 0.1 - 1.0 K/uL   Eosinophils Relative 2 %   Eosinophils Absolute 0.1 0.0 - 0.5 K/uL   Basophils Relative 1 %   Basophils Absolute 0.1 0.0 - 0.1 K/uL   Immature Granulocytes 0 %   Abs Immature Granulocytes 0.02 0.00 - 0.07 K/uL    Comment: Performed at [facility], [address]  [place], [place] [address]  Comprehensive metabolic panel     Status: Abnormal   Collection Time: 08/15/23  9:07 AM  Result Value Ref Range   Sodium 138 135 - 145 mmol/L   Potassium 4.3 3.5 - 5.1 mmol/L   Chloride 100 98 - 111 mmol/L   CO2 30 22 - 32 mmol/L   Glucose, Bld 86 70 - 99 mg/dL    Comment: Glucose reference range applies only to samples taken after fasting for at least 8 hours.   BUN 18 6 - 20 mg/dL   Creatinine, Ser 0.25 0.44 - 1.00 mg/dL   Calcium 8.8 (L) 8.9 - 10.3 mg/dL   Total Protein 7.1 6.5 - 8.1 g/dL   Albumin 4.1 3.5 - 5.0 g/dL   AST 22 15 - 41 U/L   ALT 24 0 - 44 U/L   Alkaline Phosphatase 94 38 - 126 U/L   Total Bilirubin 0.3 0.3 - 1.2 mg/dL   GFR, Estimated >42 >  60 mL/min    Comment: (NOTE) Calculated using the CKD-EPI Creatinine Equation (2021)    Anion gap 8 5 - 15    Comment: Performed at [facility], [address] [place], [place] [address]  Surgical pcr screen     Status: None   Collection Time: 08/15/23  9:09 AM   Specimen: Nasal Mucosa; Nasal Swab  Result Value Ref Range   MRSA, PCR NEGATIVE NEGATIVE   Staphylococcus aureus NEGATIVE NEGATIVE    Comment: (NOTE) The Xpert SA Assay (FDA approved for NASAL specimens in patients 53 years of age and older), is one component of a comprehensive surveillance program. It is not intended to diagnose infection nor to guide or monitor treatment. Performed at [facility], [address] [place], [place] [address]   Urinalysis, Routine w reflex microscopic -Urine, Clean Catch     Status: Abnormal   Collection Time: 08/15/23  9:09 AM  Result Value Ref Range   Color, Urine YELLOW (A) YELLOW   APPearance HAZY (A) CLEAR   Specific Gravity, Urine 1.021 1.005 - 1.030   pH 6.0 5.0 - 8.0   Glucose, UA NEGATIVE NEGATIVE mg/dL   Hgb urine dipstick NEGATIVE NEGATIVE   Bilirubin Urine NEGATIVE NEGATIVE   Ketones, ur NEGATIVE NEGATIVE mg/dL   Protein, ur NEGATIVE NEGATIVE mg/dL   Nitrite  NEGATIVE NEGATIVE   Leukocytes,Ua NEGATIVE NEGATIVE    Comment: Performed at [facility], [address] [place], [place] [address]  Glucose, capillary     Status: Abnormal   Collection Time: 08/23/23  6:54 AM  Result Value Ref Range   Glucose-Capillary 62 (L) 70 - 99 mg/dL    Comment: Glucose reference range applies only to samples taken after fasting for at least 8 hours.  Glucose, capillary     Status: Abnormal   Collection Time: 08/23/23  8:24 AM  Result Value Ref Range   Glucose-Capillary 63 (L) 70 - 99 mg/dL    Comment: Glucose reference range applies only to samples taken after fasting for at least 8 hours.  Glucose, capillary     Status: Abnormal   Collection Time: 08/23/23  9:28 AM  Result Value Ref Range   Glucose-Capillary 119 (H) 70 - 99 mg/dL    Comment: Glucose reference range applies only to samples taken after fasting for at least 8 hours.  Glucose, capillary     Status: None   Collection Time: 08/23/23 10:30 AM  Result Value Ref Range   Glucose-Capillary 98 70 - 99 mg/dL    Comment: Glucose reference range applies only to samples taken after fasting for at least 8 hours.  POCT Glucose (CBG)     Status: Abnormal   Collection Time: 09/04/23 11:29 AM  Result Value Ref Range   POC Glucose 103 (A) 70 - 99 mg/dl    Allergies as of 91/47/8295       Reactions   Penicillins Hives, Other (See Comments), Rash   TOLERATED ROCEPHIN AND CEFAZOLIN Did it involve swelling of the face/tongue/throat, SOB, or low BP? Yes Did it involve sudden or severe rash/hives, skin peeling, or any reaction on the inside of your mouth or nose? No Did you need to seek medical attention at a hospital or doctor's office? No When did it last happen? Within the past 10 years  If all above answers are "NO", may proceed with cephalosporin use. Did it involve swelling of the face/tongue/throat, SOB, or low BP? Yes  Did it involve sudden or  severe rash/hives, skin peeling, or any reaction  on the inside of your mouth or nose? No  Did you need to seek medical attention at a hospital or doctor's office? No  When did it last happen? Within the past 10 years   If all above answers are "NO", may proceed with cephalosporin use.  Has patient had a PCN reaction causing immediate rash, facial/tongue/throat swelling, SOB or lightheadedness with hypotension: Yes  Has patient had a PCN reaction causing severe rash involving mucus membranes or skin necrosis: No  Has patient had a PCN reaction that required hospitalization: No  Has patient had a PCN reaction occurring within the last 10 years: Yes  If all of the above answers are "NO", then may proceed with Cephalosporin use.   Levofloxacin Other (See Comments)   Dizziness   Oxycodone Itching   Veozah [fezolinetant] Rash   Vilazodone    unknown        Medication List        Accurate as of October 15, 2023 11:59 PM. If you have any questions, ask your nurse or doctor.          Accu-Chek Guide test strip Generic drug: glucose blood USE TO CHECK BLOOD GLUCOSE ONCE DAILY   amiodarone 200 MG tablet Commonly known as: PACERONE Take 1 tablet (200 mg total) by mouth daily as needed.   ASHWAGANDHA PO Take 1 capsule by mouth in the morning. Gummy   atorvastatin 80 MG tablet Commonly known as: LIPITOR TAKE 1 TABLET BY MOUTH EVERY DAY FOR CHOLESTEROL   azelastine 0.1 % nasal spray Commonly known as: ASTELIN Place 2 sprays into both nostrils 2 (two) times daily. Use in each nostril as directed   B-complex with vitamin C tablet Take 1 tablet by mouth daily.   BIOTIN PO Take 1 drop by mouth. With collagen   BLACK COHOSH EXTRACT PO Take 1 tablet by mouth daily.   buPROPion 150 MG 24 hr tablet Commonly known as: WELLBUTRIN XL Take 1 tablet (150 mg total) by mouth in the morning.   cloNIDine 0.1 MG tablet Commonly known as: CATAPRES Take 0.1 mg by mouth at bedtime.   clopidogrel 75 MG tablet Commonly known as:  PLAVIX Take 1 tablet (75 mg total) by mouth daily.   doxycycline 100 MG capsule Commonly known as: VIBRAMYCIN Take 1 capsule (100 mg total) by mouth 2 (two) times daily. Started by: Miki Kins   DULoxetine 60 MG capsule Commonly known as: CYMBALTA Take 1 capsule (60 mg total) by mouth 2 (two) times daily.   gabapentin 600 MG tablet Commonly known as: NEURONTIN Take 1 tablet (600 mg total) by mouth 3 (three) times daily. Take 1 tablet (300 mg) by mouth in the morning & take 2 tablets (600 mg) by mouth at night.   Gemtesa 75 MG Tabs Generic drug: Vibegron Take 1 tablet (75 mg total) by mouth at bedtime.   HYDROcodone-acetaminophen 10-325 MG tablet Commonly known as: NORCO Take 1 tablet by mouth every 4 (four) hours as needed for moderate pain (pain score 4-6) or severe pain (pain score 7-10) (pain).   lisinopril-hydrochlorothiazide 10-12.5 MG tablet Commonly known as: Zestoretic Take 1 tablet by mouth daily with supper.   Magnesium 500 MG Caps Take 500 mg by mouth in the morning.   metoprolol succinate 25 MG 24 hr tablet Commonly known as: TOPROL-XL Take 1 tablet (25 mg total) by mouth daily.   metoprolol tartrate 50 MG tablet Commonly known as: LOPRESSOR  Take 50 mg by mouth 2 (two) times daily.   Mounjaro 15 MG/0.5ML Pen Generic drug: tirzepatide Inject 15 mg into the skin once a week.   pantoprazole 40 MG tablet Commonly known as: PROTONIX Take 1 tablet (40 mg total) by mouth daily.   polyethylene glycol 17 g packet Commonly known as: MIRALAX / GLYCOLAX Take 17 g by mouth daily as needed for mild constipation.   PROBIOTIC DIGESTIVE SUPPORT PO Take by mouth.   rivaroxaban 20 MG Tabs tablet Commonly known as: XARELTO Take 1 tablet (20 mg total) by mouth daily with supper.   Sodium Fluoride 5000 Enamel 1.1-5 % Gel Generic drug: Sod Fluoride-Potassium Nitrate Use paste to brush teeth 2 (two) times daily morning and night. Do not eat or drink 30 minutes  after use.   Trintellix 5 MG Tabs tablet Generic drug: vortioxetine HBr Take 5 mg by mouth in the morning.   vitamin C with rose hips 1000 MG tablet Take 1,000 mg by mouth daily.   Vitamin D3 125 MCG (5000 UT) Caps Take 5,000 Units by mouth daily.   VITAMIN E PO Take 1 capsule by mouth every evening.            Assessment & Plan:   Problem List Items Addressed This Visit   None Visit Diagnoses       Acute otitis media, unspecified otitis media type    -  Primary   sending some antibiotics for her.  Also suggested she continue using her allergy medications.  recheck at f/u   Relevant Medications   doxycycline (VIBRAMYCIN) 100 MG capsule     Encounter for screening mammogram for breast cancer       Mammogram ordered today.  will contact pt. with results.   Relevant Orders   MM 3D SCREENING MAMMOGRAM BILATERAL BREAST        Return as previously scheduled.  Total time spent: 20 minutes  Miki Kins, FNP  10/15/2023   This document may have been prepared by [facility] Voice Recognition software and as such may include unintentional dictation errors.

## 2023-10-16 ENCOUNTER — Encounter: Payer: Self-pay | Admitting: Family

## 2023-10-18 ENCOUNTER — Telehealth: Payer: Self-pay

## 2023-10-18 NOTE — Telephone Encounter (Signed)
 Patient saw you last week for an ear ache. It is not any better. Getting worse. Please advise.

## 2023-10-22 ENCOUNTER — Other Ambulatory Visit: Payer: Self-pay

## 2023-10-23 ENCOUNTER — Other Ambulatory Visit: Payer: Self-pay

## 2023-10-23 MED ORDER — AZITHROMYCIN 250 MG PO TABS: ORAL_TABLET | ORAL | 0 refills | Status: AC

## 2023-10-23 NOTE — Telephone Encounter (Signed)
 Sent portal message and new medication

## 2023-10-25 ENCOUNTER — Encounter: Payer: Self-pay | Admitting: Family

## 2023-10-25 ENCOUNTER — Ambulatory Visit: Payer: 59 | Admitting: Family

## 2023-10-25 VITALS — BP 112/68 | HR 62 | Ht 61.0 in | Wt 230.0 lb

## 2023-10-25 DIAGNOSIS — E559 Vitamin D deficiency, unspecified: Secondary | ICD-10-CM

## 2023-10-25 DIAGNOSIS — I10 Essential (primary) hypertension: Secondary | ICD-10-CM | POA: Diagnosis not present

## 2023-10-25 DIAGNOSIS — E1165 Type 2 diabetes mellitus with hyperglycemia: Secondary | ICD-10-CM | POA: Diagnosis not present

## 2023-10-25 DIAGNOSIS — R300 Dysuria: Secondary | ICD-10-CM

## 2023-10-25 DIAGNOSIS — R5383 Other fatigue: Secondary | ICD-10-CM

## 2023-10-25 DIAGNOSIS — Z1159 Encounter for screening for other viral diseases: Secondary | ICD-10-CM

## 2023-10-25 DIAGNOSIS — F331 Major depressive disorder, recurrent, moderate: Secondary | ICD-10-CM

## 2023-10-25 DIAGNOSIS — E538 Deficiency of other specified B group vitamins: Secondary | ICD-10-CM | POA: Diagnosis not present

## 2023-10-25 DIAGNOSIS — I69359 Hemiplegia and hemiparesis following cerebral infarction affecting unspecified side: Secondary | ICD-10-CM

## 2023-10-25 DIAGNOSIS — E782 Mixed hyperlipidemia: Secondary | ICD-10-CM

## 2023-10-25 DIAGNOSIS — M546 Pain in thoracic spine: Secondary | ICD-10-CM | POA: Insufficient documentation

## 2023-10-25 DIAGNOSIS — J441 Chronic obstructive pulmonary disease with (acute) exacerbation: Secondary | ICD-10-CM

## 2023-10-25 LAB — POCT URINALYSIS DIPSTICK
Bilirubin, UA: NEGATIVE
Blood, UA: NEGATIVE
Glucose, UA: NEGATIVE
Leukocytes, UA: NEGATIVE
Nitrite, UA: NEGATIVE
Protein, UA: NEGATIVE
Spec Grav, UA: 1.02 (ref 1.010–1.025)
Urobilinogen, UA: 0.2 U/dL
pH, UA: 6 (ref 5.0–8.0)

## 2023-10-25 MED FILL — Tirzepatide Soln Auto-injector 15 MG/0.5ML: SUBCUTANEOUS | 28 days supply | Qty: 2 | Fill #2 | Status: AC

## 2023-10-25 NOTE — Progress Notes (Signed)
 Established Patient Office Visit  Subjective:  Patient ID: Terri Wood, female    DOB: [birth year]  Age: 58 y.o. MRN: [id number]  Chief Complaint  Patient presents with   Follow-up    2 month follow up    Patient is here today for her 2 months follow up.  She has been feeling fairly well since last appointment.   She does have additional concerns to discuss today.  She thinks she might have a UTI, having urinary urgency and frequency.   Labs are due today. She needs refills.   I have reviewed her active problem list, medication list, allergies, health maintenance, notes from last encounter, lab results for her appointment today.      No other concerns at this time.   Past Medical History:  Diagnosis Date   Acute ischemic right MCA stroke (HCC) 08/15/2019   Acute respiratory failure (HCC) 01/16/2021   a.) in setting of CAP   Anxiety    Aortic atherosclerosis (HCC)    Atrial fibrillation (HCC)    a.) CHA2DS2-VASc = 5 (sex, HTN, CVA x2, vascular disease history) as of 08/20/2023; b.) cardiac rate/rhythm maintained on oral amiodarone  + metoprolol ; chronically anticoagulated using rivaroxaban ; on chronic clopidogrel    Avascular necrosis of left femoral head (HCC) 04/09/2018   CAD (coronary artery disease) 03/13/2023   a.) MV 03/13/2023: small mild reversible bas/mid anterosep/inferosep/apex defects c/w isch; b.) cCTA 03/29/2023: Ca2+ = 673.3 (99th %'ile)   Carotid artery disease (HCC) 08/16/2019   a.) doppler 08/16/2019: 40-59% LICA   Chronic midline low back pain with right-sided sciatica 02/11/2016   Community acquired pneumonia 01/16/2021   Depression    Essential hypertension    Fibromyalgia    Foraminal stenosis of lumbar region 02/27/2018   GERD (gastroesophageal reflux disease)    Long term current use of amiodarone     Lumbar radiculopathy 12/15/2016   Mixed hyperlipidemia    Multifocal pneumonia 06/05/2021   OAB (overactive bladder)    On chronic  clopidogrel  therapy    On rivaroxaban  therapy    Osteoarthritis of left hip 2019   Restless leg syndrome 02/02/2015   Rotator cuff tendinitis, right 06/04/2023   Sepsis (HCC) 01/16/2021   T2DM (type 2 diabetes mellitus) (HCC)    Tendinitis of upper biceps tendon of right shoulder 06/04/2023   Traumatic complete tear of right rotator cuff 06/04/2023    Past Surgical History:  Procedure Laterality Date   ABDOMINAL SURGERY  1996   gastric bypass; stapling; surgilite   GASTRIC BYPASS OPEN  1996   PLANTAR FASCIA SURGERY Right 2009   REVERSE SHOULDER ARTHROPLASTY Right 08/23/2023   Procedure: REVERSE SHOULDER ARTHROPLASTY WITH BICEPS TENODESIS;  Surgeon: Edie Norleen JINNY, MD;  Location: [facility];  Service: Orthopedics;  Laterality: Right;   TARSAL TUNNEL RELEASE Right 2009   TOTAL HIP ARTHROPLASTY Left 04/09/2018   Procedure: TOTAL HIP ARTHROPLASTY ANTERIOR APPROACH;  Surgeon: Kathlynn Sharper, MD;  Location: [facility];  Service: Orthopedics;  Laterality: Left;   TOTAL KNEE ARTHROPLASTY Right 07/12/2021   Procedure: TOTAL KNEE ARTHROPLASTY;  Surgeon: Kathlynn Sharper, MD;  Location: [facility];  Service: Orthopedics;  Laterality: Right;   TOTAL KNEE ARTHROPLASTY Left 02/16/2022   Procedure: TOTAL KNEE ARTHROPLASTY;  Surgeon: Kathlynn Sharper, MD;  Location: [facility];  Service: Orthopedics;  Laterality: Left;    Social History   Socioeconomic History   Marital status: Legally Separated    Spouse name: Not on file   Number of children: 0   Years  of education: Not on file   Highest education level: Not on file  Occupational History   Not on file  Tobacco Use   Smoking status: Former    Current packs/day: 0.00    Types: Cigarettes    Quit date: 07/2019    Years since quitting: 4.4   Smokeless tobacco: Never  Vaping Use   Vaping status: Never Used  Substance and Sexual Activity   Alcohol use: Not Currently   Drug use: Not Currently    Types: Marijuana    Comment: none since 2019   Sexual  activity: Not Currently  Other Topics Concern   Not on file  Social History Narrative   Lives with mother   Social Drivers of Health   Financial Resource Strain: Low Risk  (01/16/2022)   Received from [facility]   Overall Financial Resource Strain (CARDIA)    Difficulty of Paying Living Expenses: Not very hard  Food Insecurity: Unknown (01/16/2022)   Received from [facility]   Hunger Vital Sign    Worried About Running Out of Food in the Last Year: Never true    Ran Out of Food in the Last Year: Not on file  Transportation Needs: No Transportation Needs (01/16/2022)   Received from [facility] - Transportation    In the past 12 months, has lack of transportation kept you from medical appointments or from getting medications?: No    Lack of Transportation (Non-Medical): No  Physical Activity: Inactive (01/16/2022)   Received from [facility]   Exercise Vital Sign    Days of Exercise per Week: 0 days    Minutes of Exercise per Session: 0 min  Stress: No Stress Concern Present (01/16/2022)   Received from [facility] - Occupational Stress Questionnaire    Feeling of Stress : Not at all  Social Connections: Moderately Integrated (01/16/2022)   Received from [facility]   Social Connection and Isolation Panel [NHANES]    Frequency of Communication with Friends and Family: More than three times a week    Frequency of Social Gatherings with Friends and Family: Never    Attends Religious Services: More than 4 times per year    Active Member of [employer] or Organizations: Yes    Attends Engineer, Structural: More than 4 times per year    Marital Status: Separated  Intimate Partner Violence: Not on file    Family History  Problem Relation Age of Onset   Aneurysm Mother    Heart disease Father     Allergies  Allergen  Reactions   Penicillins Hives, Other (See Comments) and Rash    TOLERATED ROCEPHIN  AND CEFAZOLIN   Did it involve swelling of the face/tongue/throat, SOB, or low BP? Yes  Did it involve sudden or severe rash/hives, skin peeling, or any reaction on the inside of your mouth or nose? No  Did you need to seek medical attention at a hospital or doctor's office? No  When did it last happen? Within the past 10 years   If all above answers are NO, may proceed with cephalosporin use.  Did it involve swelling of the face/tongue/throat, SOB, or low BP? Yes  Did it involve sudden or severe rash/hives, skin peeling, or any reaction on the inside of your mouth or nose? No  Did you need to seek medical attention at a hospital or doctor's  office? No  When did it last happen? Within the past 10 years   If all above answers are "NO", may proceed with cephalosporin use.  Has patient had a PCN reaction causing immediate rash, facial/tongue/throat swelling, SOB or lightheadedness with hypotension: Yes  Has patient had a PCN reaction causing severe rash involving mucus membranes or skin necrosis: No  Has patient had a PCN reaction that required hospitalization: No  Has patient had a PCN reaction occurring within the last 10 years: Yes  If all of the above answers are NO, then may proceed with Cephalosporin use.  Did it involve swelling of the face/tongue/throat, SOB, or low BP? Yes    Did it involve sudden or severe rash/hives, skin peeling, or any reaction on the inside of your mouth or nose? No    Did you need to seek medical attention at a hospital or doctor's office? No    When did it last happen? Within the past 10 years     If all above answers are "NO", may proceed with cephalosporin use.    Has patient had a PCN reaction causing immediate rash, facial/tongue/throat swelling, SOB or lightheadedness with hypotension: Yes    Has patient had a PCN reaction causing severe rash involving mucus  membranes or skin necrosis: No    Has patient had a PCN reaction that required hospitalization: No    Has patient had a PCN reaction occurring within the last 10 years: Yes    If all of the above answers are NO, then may proceed with Cephalosporin use.    TOLERATED ROCEPHIN  AND CEFAZOLIN   Did it involve swelling of the face/tongue/throat, SOB, or low BP? Yes  Did it involve sudden or severe rash/hives, skin peeling, or any reaction on the inside of your mouth or nose? No  Did you need to seek medical attention at a hospital or doctor's office? No  When did it last happen? Within the past 10 years   If all above answers are NO, may proceed with cephalosporin use.  Did it involve swelling of the face/tongue/throat, SOB, or low BP? Yes Did it involve sudden or severe rash/hives, skin peeling, or any reaction on the inside of your mouth or nose? No Did you need to seek medical attention at a hospital or docto... (TRUNCATED)   Levofloxacin  Other (See Comments)    Dizziness   Oxycodone  Itching   [place]ozah  [Fezolinetant ] Rash   Vilazodone Other (See Comments)    unknown    Review of Systems  All other systems reviewed and are negative.      Objective:   BP 112/68   Pulse 62   Ht 5' 1 (1.549 m)   Wt 230 lb (104.3 kg)   LMP 04/01/2018 (Exact Date)   SpO2 97%   BMI 43.46 kg/m   Vitals:   10/25/23 1106  BP: 112/68  Pulse: 62  Height: 5' 1 (1.549 m)  Weight: 230 lb (104.3 kg)  SpO2: 97%  BMI (Calculated): 43.48    Physical Exam Vitals and nursing note reviewed.  Constitutional:      Appearance: Normal appearance. She is normal weight.  HENT:     Head: Normocephalic.  Eyes:     Extraocular Movements: Extraocular movements intact.     Conjunctiva/sclera: Conjunctivae normal.     Pupils: Pupils are equal, round, and reactive to light.  Cardiovascular:     Rate and Rhythm: Normal rate.  Pulmonary:     Effort: Pulmonary effort is normal.  Musculoskeletal:        General:  Normal range of motion.  Neurological:     General: No focal deficit present.     Mental Status: She is alert and oriented to person, place, and time. Mental status is at baseline.  Psychiatric:        Mood and Affect: Mood normal.        Behavior: Behavior normal.        Thought Content: Thought content normal.        Judgment: Judgment normal.      Results for orders placed or performed in visit on 10/25/23  Urine Culture   Specimen: Urine, Clean Catch   UC  Result Value Ref Range   Urine Culture, Routine Final report    Organism ID, Bacteria Comment   Lipid panel  Result Value Ref Range   Cholesterol, Total 125 100 - 199 mg/dL   Triglycerides 93 0 - 149 mg/dL   HDL 54 >60 mg/dL   VLDL Cholesterol Cal 18 5 - 40 mg/dL   LDL Chol Calc (NIH) 53 0 - 99 mg/dL   Chol/HDL Ratio 2.3 0.0 - 4.4 ratio  VITAMIN D  25 Hydroxy (Vit-D Deficiency, Fractures)  Result Value Ref Range   Vit D, 25-Hydroxy 44.9 30.0 - 100.0 ng/mL  CMP14+EGFR  Result Value Ref Range   Glucose 82 70 - 99 mg/dL   BUN 20 6 - 24 mg/dL   Creatinine, Ser 9.06 0.57 - 1.00 mg/dL   eGFR 72 >40 fO/fpw/8.26   BUN/Creatinine Ratio 22 9 - 23   Sodium 148 (H) 134 - 144 mmol/L   Potassium 5.6 (H) 3.5 - 5.2 mmol/L   Chloride 104 96 - 106 mmol/L   CO2 23 20 - 29 mmol/L   Calcium  9.9 8.7 - 10.2 mg/dL   Total Protein 6.8 6.0 - 8.5 g/dL   Albumin 4.4 3.8 - 4.9 g/dL   Globulin, Total 2.4 1.5 - 4.5 g/dL   Bilirubin Total 0.3 0.0 - 1.2 mg/dL   Alkaline Phosphatase 128 (H) 44 - 121 IU/L   AST 23 0 - 40 IU/L   ALT 21 0 - 32 IU/L  TSH  Result Value Ref Range   TSH 1.340 0.450 - 4.500 uIU/mL  Hemoglobin A1c  Result Value Ref Range   Hgb A1c MFr Bld 5.8 (H) 4.8 - 5.6 %   Est. average glucose Bld gHb Est-mCnc 120 mg/dL  Vitamin A87  Result Value Ref Range   Vitamin B-12 495 232 - 1,245 pg/mL  CBC with Diff  Result Value Ref Range   WBC 8.2 3.4 - 10.8 x10E3/uL   RBC 4.25 3.77 - 5.28 x10E6/uL   Hemoglobin 12.8 11.1 -  15.9 g/dL   Hematocrit 59.4 65.9 - 46.6 %   MCV 95 79 - 97 fL   MCH 30.1 26.6 - 33.0 pg   MCHC 31.6 31.5 - 35.7 g/dL   RDW 87.4 88.2 - 84.5 %   Platelets 381 150 - 450 x10E3/uL   Neutrophils 56 Not Estab. %   Lymphs 31 Not Estab. %   Monocytes 11 Not Estab. %   Eos 1 Not Estab. %   Basos 1 Not Estab. %   Neutrophils Absolute 4.6 1.4 - 7.0 x10E3/uL   Lymphocytes Absolute 2.5 0.7 - 3.1 x10E3/uL   Monocytes Absolute 0.9 0.1 - 0.9 x10E3/uL   EOS (ABSOLUTE) 0.1 0.0 - 0.4 x10E3/uL   Basophils Absolute 0.1 0.0 - 0.2 x10E3/uL   Immature Granulocytes  0 Not Estab. %   Immature Grans (Abs) 0.0 0.0 - 0.1 x10E3/uL  Hepatitis C Ab reflex to Quant PCR  Result Value Ref Range   HCV Ab Non Reactive Non Reactive  Urinalysis, Routine w reflex microscopic  Result Value Ref Range   Specific Gravity, UA 1.022 1.005 - 1.030   pH, UA 6.0 5.0 - 7.5   Color, UA Yellow Yellow   Appearance Ur Clear Clear   Leukocytes,UA Negative Negative   Protein,UA Negative Negative/Trace   Glucose, UA Negative Negative   Ketones, UA Negative Negative   RBC, UA Negative Negative   Bilirubin, UA Negative Negative   Urobilinogen, Ur 0.2 0.2 - 1.0 mg/dL   Nitrite, UA Negative Negative   Microscopic Examination Comment   Interpretation:  Result Value Ref Range   HCV Interp 1: Comment   POCT Urinalysis Dipstick (18997)  Result Value Ref Range   Color, UA yellow    Clarity, UA clear    Glucose, UA Negative Negative   Bilirubin, UA neg    Ketones, UA trace    Spec Grav, UA 1.020 1.010 - 1.025   Blood, UA neg    pH, UA 6.0 5.0 - 8.0   Protein, UA Negative Negative   Urobilinogen, UA 0.2 0.2 or 1.0 E.U./dL   Nitrite, UA neg    Leukocytes, UA Negative Negative   Appearance clear    Odor yes     Recent Results (from the past 2160 hours)  POCT Urinalysis Dipstick (18997)     Status: None   Collection Time: 10/25/23 11:45 AM  Result Value Ref Range   Color, UA yellow    Clarity, UA clear    Glucose, UA  Negative Negative   Bilirubin, UA neg    Ketones, UA trace    Spec Grav, UA 1.020 1.010 - 1.025   Blood, UA neg    pH, UA 6.0 5.0 - 8.0   Protein, UA Negative Negative   Urobilinogen, UA 0.2 0.2 or 1.0 E.U./dL   Nitrite, UA neg    Leukocytes, UA Negative Negative   Appearance clear    Odor yes   Lipid panel     Status: None   Collection Time: 10/25/23 12:04 PM  Result Value Ref Range   Cholesterol, Total 125 100 - 199 mg/dL   Triglycerides 93 0 - 149 mg/dL   HDL 54 >60 mg/dL   VLDL Cholesterol Cal 18 5 - 40 mg/dL   LDL Chol Calc (NIH) 53 0 - 99 mg/dL   Chol/HDL Ratio 2.3 0.0 - 4.4 ratio    Comment:                                   T. Chol/HDL Ratio                                             Men  Women                               1/2 Avg.Risk  3.4    3.3  Avg.Risk  5.0    4.4                                2X Avg.Risk  9.6    7.1                                3X Avg.Risk 23.4   11.0   VITAMIN D  25 Hydroxy (Vit-D Deficiency, Fractures)     Status: None   Collection Time: 10/25/23 12:04 PM  Result Value Ref Range   Vit D, 25-Hydroxy 44.9 30.0 - 100.0 ng/mL    Comment: Vitamin D  deficiency has been defined by the Institute of Medicine and an Endocrine Society practice guideline as a level of serum 25-OH vitamin D  less than 20 ng/mL (1,2). The Endocrine Society went on to further define vitamin D  insufficiency as a level between 21 and 29 ng/mL (2). 1. IOM (Institute of Medicine). 2010. Dietary reference    intakes for calcium  and D. [place]: The    [employer]. 2. Holick MF, Binkley [place], Bischoff-Ferrari HA, et al.    Evaluation, treatment, and prevention of vitamin D     deficiency: an Endocrine Society clinical practice    guideline. JCEM. 2011 Jul; 96(7):1911-30.   CMP14+EGFR     Status: Abnormal   Collection Time: 10/25/23 12:04 PM  Result Value Ref Range   Glucose 82 70 - 99 mg/dL   BUN 20 6 - 24 mg/dL    Creatinine, Ser 9.06 0.57 - 1.00 mg/dL   eGFR 72 >40 fO/fpw/8.26   BUN/Creatinine Ratio 22 9 - 23   Sodium 148 (H) 134 - 144 mmol/L   Potassium 5.6 (H) 3.5 - 5.2 mmol/L   Chloride 104 96 - 106 mmol/L   CO2 23 20 - 29 mmol/L   Calcium  9.9 8.7 - 10.2 mg/dL   Total Protein 6.8 6.0 - 8.5 g/dL   Albumin 4.4 3.8 - 4.9 g/dL   Globulin, Total 2.4 1.5 - 4.5 g/dL   Bilirubin Total 0.3 0.0 - 1.2 mg/dL   Alkaline Phosphatase 128 (H) 44 - 121 IU/L   AST 23 0 - 40 IU/L   ALT 21 0 - 32 IU/L  TSH     Status: None   Collection Time: 10/25/23 12:04 PM  Result Value Ref Range   TSH 1.340 0.450 - 4.500 uIU/mL  Hemoglobin A1c     Status: Abnormal   Collection Time: 10/25/23 12:04 PM  Result Value Ref Range   Hgb A1c MFr Bld 5.8 (H) 4.8 - 5.6 %    Comment:          Prediabetes: 5.7 - 6.4          Diabetes: >6.4          Glycemic control for adults with diabetes: <7.0    Est. average glucose Bld gHb Est-mCnc 120 mg/dL  Vitamin B12     Status: None   Collection Time: 10/25/23 12:04 PM  Result Value Ref Range   Vitamin B-12 495 232 - 1,245 pg/mL  CBC with Diff     Status: None   Collection Time: 10/25/23 12:04 PM  Result Value Ref Range   WBC 8.2 3.4 - 10.8 x10E3/uL   RBC 4.25 3.77 - 5.28 x10E6/uL   Hemoglobin 12.8 11.1 - 15.9 g/dL   Hematocrit 59.4 65.9 - 46.6 %  MCV 95 79 - 97 fL   MCH 30.1 26.6 - 33.0 pg   MCHC 31.6 31.5 - 35.7 g/dL   RDW 87.4 88.2 - 84.5 %   Platelets 381 150 - 450 x10E3/uL   Neutrophils 56 Not Estab. %   Lymphs 31 Not Estab. %   Monocytes 11 Not Estab. %   Eos 1 Not Estab. %   Basos 1 Not Estab. %   Neutrophils Absolute 4.6 1.4 - 7.0 x10E3/uL   Lymphocytes Absolute 2.5 0.7 - 3.1 x10E3/uL   Monocytes Absolute 0.9 0.1 - 0.9 x10E3/uL   EOS (ABSOLUTE) 0.1 0.0 - 0.4 x10E3/uL   Basophils Absolute 0.1 0.0 - 0.2 x10E3/uL   Immature Granulocytes 0 Not Estab. %   Immature Grans (Abs) 0.0 0.0 - 0.1 x10E3/uL  Hepatitis C Ab reflex to Quant PCR     Status: None   Collection  Time: 10/25/23 12:04 PM  Result Value Ref Range   HCV Ab Non Reactive Non Reactive  Interpretation:     Status: None   Collection Time: 10/25/23 12:04 PM  Result Value Ref Range   HCV Interp 1: Comment     Comment: Not infected with HCV unless early or acute infection is suspected (which may be delayed in an immunocompromised individual), or other evidence exists to indicate HCV infection.   Urine Culture     Status: None   Collection Time: 10/25/23  4:05 PM   Specimen: Urine, Clean Catch   UC  Result Value Ref Range   Urine Culture, Routine Final report    Organism ID, Bacteria Comment     Comment: Mixed urogenital flora 10,000-25,000 colony forming units per mL   Urinalysis, Routine w reflex microscopic     Status: None   Collection Time: 10/25/23  4:07 PM  Result Value Ref Range   Specific Gravity, UA 1.022 1.005 - 1.030   pH, UA 6.0 5.0 - 7.5   Color, UA Yellow Yellow   Appearance Ur Clear Clear   Leukocytes,UA Negative Negative   Protein,UA Negative Negative/Trace   Glucose, UA Negative Negative   Ketones, UA Negative Negative   RBC, UA Negative Negative   Bilirubin, UA Negative Negative   Urobilinogen, Ur 0.2 0.2 - 1.0 mg/dL   Nitrite, UA Negative Negative   Microscopic Examination Comment     Comment: Microscopic not indicated and not performed.  POCT Urinalysis Dipstick (18997)     Status: Abnormal   Collection Time: 11/19/23 11:45 AM  Result Value Ref Range   Color, UA orange    Clarity, UA cloudy    Glucose, UA Negative Negative   Bilirubin, UA small    Ketones, UA trace    Spec Grav, UA 1.020 1.010 - 1.025   Blood, UA neg    pH, UA 7.0 5.0 - 8.0   Protein, UA Negative Negative   Urobilinogen, UA 1.0 0.2 or 1.0 E.U./dL   Nitrite, UA neg    Leukocytes, UA Trace (A) Negative   Appearance cloudy    Odor yes   Urine Culture     Status: None   Collection Time: 11/19/23  2:00 PM   Specimen: Urine   UR  Result Value Ref Range   Urine Culture, Routine  Final report    Organism ID, Bacteria Lactobacillus species     Comment: 50,000-100,000 colony forming units per mL Susceptibility not normally performed on this organism.   Potassium     Status: None   Collection Time: 11/19/23  2:06 PM  Result Value Ref Range   Potassium 4.4 3.5 - 5.2 mmol/L  POC Influenza A&B (Binax test)     Status: None   Collection Time: 12/12/23 11:49 AM  Result Value Ref Range   Influenza A, POC Negative Negative   Influenza B, POC Negative Negative       Assessment & Plan:   Problem List Items Addressed This Visit       Cardiovascular and Mediastinum   Essential hypertension, benign   Blood pressure well controlled with current medications.  Continue current therapy.  Will reassess at follow up.        Relevant Orders   CMP14+EGFR (Completed)   CBC with Diff (Completed)     Respiratory   COPD with acute exacerbation (HCC)     Endocrine   Type 2 diabetes mellitus with hyperglycemia, without long-term current use of insulin  (HCC) - Primary   Checking labs today. Will call pt. With results  Continue current diabetes POC, as patient has been well controlled on current regimen.  Will adjust meds if needed based on labs.        Relevant Orders   CMP14+EGFR (Completed)   Hemoglobin A1c (Completed)   CBC with Diff (Completed)     Nervous and Auditory   CVA, old, hemiparesis (HCC)     Other   Hyperlipidemia   Checking labs today.  Continue current therapy for lipid control. Will modify as needed based on labwork results.        Relevant Orders   Lipid panel (Completed)   CMP14+EGFR (Completed)   CBC with Diff (Completed)   Major depressive disorder, recurrent episode, moderate (HCC)   Vitamin D  deficiency, unspecified   Relevant Orders   VITAMIN D  25 Hydroxy (Vit-D Deficiency, Fractures) (Completed)   CMP14+EGFR (Completed)   CBC with Diff (Completed)   Other Visit Diagnoses       B12 deficiency due to diet       Checking  labs today.  Will continue supplements as needed.   Relevant Orders   CMP14+EGFR (Completed)   Vitamin B12 (Completed)   CBC with Diff (Completed)     Other fatigue       Relevant Orders   CMP14+EGFR (Completed)   TSH (Completed)   CBC with Diff (Completed)     Need for hepatitis C screening test       Test ordered in office today. Will call with results.   Relevant Orders   CMP14+EGFR (Completed)   CBC with Diff (Completed)   Hepatitis C Ab reflex to Quant PCR (Completed)     Dysuria       Relevant Orders   POCT Urinalysis Dipstick (18997) (Completed)   Urinalysis, Routine w reflex microscopic (Completed)   Urine Culture (Completed)     Obesity, Class III, BMI 40-49.9 (morbid obesity) (HCC)   (Chronic)         Return in about 3 months (around 01/23/2024) for F/U.   Total time spent: 20 minutes  ALAN CHRISTELLA ARRANT, FNP  10/25/2023   This document may have been prepared by [facility] Voice Recognition software and as such may include unintentional dictation errors.

## 2023-10-26 ENCOUNTER — Telehealth: Payer: Self-pay

## 2023-10-26 LAB — URINALYSIS, ROUTINE W REFLEX MICROSCOPIC
Bilirubin, UA: NEGATIVE
Glucose, UA: NEGATIVE
Ketones, UA: NEGATIVE
Leukocytes,UA: NEGATIVE
Nitrite, UA: NEGATIVE
Protein,UA: NEGATIVE
RBC, UA: NEGATIVE
Specific Gravity, UA: 1.022 (ref 1.005–1.030)
Urobilinogen, Ur: 0.2 mg/dL (ref 0.2–1.0)
pH, UA: 6 (ref 5.0–7.5)

## 2023-10-26 NOTE — Telephone Encounter (Signed)
 Patient called asking why she's  taking 2 blood thinners , she's asking if she needs to be on both of them, she said she can bump into something and it takes forever to stop bleeding

## 2023-10-27 LAB — CBC WITH DIFFERENTIAL/PLATELET
Basophils Absolute: 0.1 10*3/uL (ref 0.0–0.2)
Basos: 1 %
EOS (ABSOLUTE): 0.1 10*3/uL (ref 0.0–0.4)
Eos: 1 %
Hematocrit: 40.5 % (ref 34.0–46.6)
Hemoglobin: 12.8 g/dL (ref 11.1–15.9)
Immature Grans (Abs): 0 10*3/uL (ref 0.0–0.1)
Immature Granulocytes: 0 %
Lymphocytes Absolute: 2.5 10*3/uL (ref 0.7–3.1)
Lymphs: 31 %
MCH: 30.1 pg (ref 26.6–33.0)
MCHC: 31.6 g/dL (ref 31.5–35.7)
MCV: 95 fL (ref 79–97)
Monocytes Absolute: 0.9 10*3/uL (ref 0.1–0.9)
Monocytes: 11 %
Neutrophils Absolute: 4.6 10*3/uL (ref 1.4–7.0)
Neutrophils: 56 %
Platelets: 381 10*3/uL (ref 150–450)
RBC: 4.25 x10E6/uL (ref 3.77–5.28)
RDW: 12.5 % (ref 11.7–15.4)
WBC: 8.2 10*3/uL (ref 3.4–10.8)

## 2023-10-27 LAB — CMP14+EGFR
ALT: 21 [IU]/L (ref 0–32)
AST: 23 [IU]/L (ref 0–40)
Albumin: 4.4 g/dL (ref 3.8–4.9)
Alkaline Phosphatase: 128 [IU]/L — ABNORMAL HIGH (ref 44–121)
BUN/Creatinine Ratio: 22 (ref 9–23)
BUN: 20 mg/dL (ref 6–24)
Bilirubin Total: 0.3 mg/dL (ref 0.0–1.2)
CO2: 23 mmol/L (ref 20–29)
Calcium: 9.9 mg/dL (ref 8.7–10.2)
Chloride: 104 mmol/L (ref 96–106)
Creatinine, Ser: 0.93 mg/dL (ref 0.57–1.00)
Globulin, Total: 2.4 g/dL (ref 1.5–4.5)
Glucose: 82 mg/dL (ref 70–99)
Potassium: 5.6 mmol/L — ABNORMAL HIGH (ref 3.5–5.2)
Sodium: 148 mmol/L — ABNORMAL HIGH (ref 134–144)
Total Protein: 6.8 g/dL (ref 6.0–8.5)
eGFR: 72 mL/min/{1.73_m2} (ref >59)

## 2023-10-27 LAB — LIPID PANEL
Chol/HDL Ratio: 2.3 {ratio} (ref 0.0–4.4)
Cholesterol, Total: 125 mg/dL (ref 100–199)
HDL: 54 mg/dL (ref >39)
LDL Chol Calc (NIH): 53 mg/dL (ref 0–99)
Triglycerides: 93 mg/dL (ref 0–149)
VLDL Cholesterol Cal: 18 mg/dL (ref 5–40)

## 2023-10-27 LAB — HEMOGLOBIN A1C
Est. average glucose Bld gHb Est-mCnc: 120 mg/dL
Hgb A1c MFr Bld: 5.8 % — ABNORMAL HIGH (ref 4.8–5.6)

## 2023-10-27 LAB — URINE CULTURE

## 2023-10-27 LAB — HCV AB W REFLEX TO QUANT PCR: HCV Ab: NONREACTIVE

## 2023-10-27 LAB — HCV INTERPRETATION

## 2023-10-27 LAB — VITAMIN D 25 HYDROXY (VIT D DEFICIENCY, FRACTURES): Vit D, 25-Hydroxy: 44.9 ng/mL (ref 30.0–100.0)

## 2023-10-27 LAB — VITAMIN B12: Vitamin B-12: 495 pg/mL (ref 232–1245)

## 2023-10-27 LAB — TSH: TSH: 1.34 u[IU]/mL (ref 0.450–4.500)

## 2023-10-29 NOTE — Telephone Encounter (Signed)
 Patient informed and will contact neuro

## 2023-11-02 ENCOUNTER — Telehealth: Payer: Self-pay | Admitting: Family

## 2023-11-02 NOTE — Telephone Encounter (Signed)
Patient left VM requesting her results of her recent urine culture and labs. Also stated the Greggory Keen is making her nauseous and wants to know what to do about it. Please advise.

## 2023-11-04 ENCOUNTER — Encounter: Payer: Self-pay | Admitting: Family

## 2023-11-04 NOTE — Progress Notes (Signed)
Established Patient Office Visit  Subjective:  Patient ID: Terri Wood, female    DOB: 23-Mar-1966  Age: 58 y.o. MRN: [id number]  Chief Complaint  Patient presents with   Follow-up    Hospital follow up    Patient is here today for her hospital follow up visit.  She was recently discharged from Inpatient visit at [facility].  She was seen for Rotator Cuff Surgery.   She was asked to follow up with [place] today for: Regular follow up.   She  is feeling better after her recent hospital visit.  Pain in shoulder has improved greatly.    No other concerns at this time.   Past Medical History:  Diagnosis Date   Acute ischemic right MCA stroke (HCC) 08/15/2019   Acute respiratory failure (HCC) 01/16/2021   a.) in setting of CAP   Anxiety    Aortic atherosclerosis (HCC)    Atrial fibrillation (HCC)    a.) CHA2DS2-VASc = 5 (sex, HTN, CVA x2, vascular disease history) as of 08/20/2023; b.) cardiac rate/rhythm maintained on oral amiodarone + metoprolol; chronically anticoagulated using rivaroxaban; on chronic clopidogrel   Avascular necrosis of left femoral head (HCC) 04/09/2018   CAD (coronary artery disease) 03/13/2023   a.) MV 03/13/2023: small mild reversible bas/mid anterosep/inferosep/apex defects c/w isch; b.) cCTA 03/29/2023: Ca2+ = 673.3 (99th %'ile)   Carotid artery disease (HCC) 08/16/2019   a.) doppler 08/16/2019: 40-59% LICA   Chronic midline low back pain with right-sided sciatica 02/11/2016   Community acquired pneumonia 01/16/2021   Depression    Essential hypertension    Fibromyalgia    Foraminal stenosis of lumbar region 02/27/2018   GERD (gastroesophageal reflux disease)    Long term current use of amiodarone    Lumbar radiculopathy 12/15/2016   Mixed hyperlipidemia    Multifocal pneumonia 06/05/2021   OAB (overactive bladder)    On chronic clopidogrel therapy    On rivaroxaban therapy    Osteoarthritis of left hip 2019   Restless leg syndrome 02/02/2015    Rotator cuff tendinitis, right 06/04/2023   Sepsis (HCC) 01/16/2021   T2DM (type 2 diabetes mellitus) (HCC)    Tendinitis of upper biceps tendon of right shoulder 06/04/2023   Traumatic complete tear of right rotator cuff 06/04/2023    Past Surgical History:  Procedure Laterality Date   ABDOMINAL SURGERY  1996   gastric bypass; stapling; surgilite   GASTRIC BYPASS OPEN  1996   PLANTAR FASCIA SURGERY Right 2009   REVERSE SHOULDER ARTHROPLASTY Right 08/23/2023   Procedure: REVERSE SHOULDER ARTHROPLASTY WITH BICEPS TENODESIS;  Surgeon: Christena Flake, MD;  Location: [facility] OR[facility];  Service: Orthopedics;  Laterality: Right;   TARSAL TUNNEL RELEASE Right 2009   TOTAL HIP ARTHROPLASTY Left 04/09/2018   Procedure: TOTAL HIP ARTHROPLASTY ANTERIOR APPROACH;  Surgeon: Kennedy Bucker, MD;  Location: [facility];  Service: Orthopedics;  Laterality: Left;   TOTAL KNEE ARTHROPLASTY Right 07/12/2021   Procedure: TOTAL KNEE ARTHROPLASTY;  Surgeon: Kennedy Bucker, MD;  Location: [facility];  Service: Orthopedics;  Laterality: Right;   TOTAL KNEE ARTHROPLASTY Left 02/16/2022   Procedure: TOTAL KNEE ARTHROPLASTY;  Surgeon: Kennedy Bucker, MD;  Location: [facility];  Service: Orthopedics;  Laterality: Left;    Social History   Socioeconomic History   Marital status: Legally Separated    Spouse name: Not on file   Number of children: 0   Years of education: Not on file   Highest education level: Not on file  Occupational History  Not on file  Tobacco Use   Smoking status: Former    Current packs/day: 0.00    Types: Cigarettes    Quit date: 07/2019    Years since quitting: 4.3   Smokeless tobacco: Never  Vaping Use   Vaping status: Never Used  Substance and Sexual Activity   Alcohol use: Not Currently   Drug use: Not Currently    Types: Marijuana    Comment: none since 2019   Sexual activity: Not Currently  Other Topics Concern   Not on file  Social History Narrative   Lives with mother   Social  Drivers of Health   Financial Resource Strain: Low Risk  (01/16/2022)   Received from [facility], [facility]   Overall Financial Resource Strain (CARDIA)    Difficulty of Paying Living Expenses: Not very hard  Food Insecurity: Unknown (01/16/2022)   Received from [facility], [facility]   Hunger Vital Sign    Worried About Running Out of Food in the Last Year: Never true    Ran Out of Food in the Last Year: Not on file  Transportation Needs: No Transportation Needs (01/16/2022)   Received from [facility], [facility]   [facility] - Transportation    In the past 12 months, has lack of transportation kept you from medical appointments or from getting medications?: No    Lack of Transportation (Non-Medical): No  Physical Activity: Inactive (01/16/2022)   Received from [facility], [facility]   Exercise Vital Sign    Days of Exercise per Week: 0 days    Minutes of Exercise per Session: 0 min  Stress: No Stress Concern Present (01/16/2022)   Received from [facility], [facility]   [employer] [facility] - Occupational Stress Questionnaire    Feeling of Stress : Not at all  Social Connections: Moderately Integrated (01/16/2022)   Received from [facility], [facility]   Social Connection and Isolation Panel [NHANES]    Frequency of Communication with Friends and Family: More than three times a week    Frequency of Social Gatherings with Friends and Family: Never    Attends Religious Services: More than 4 times per year    Active Member of [employer] or Organizations: Yes    Attends Engineer, structural: More than 4 times per year    Marital Status: Separated  Intimate Partner Violence: Not on file    Family History  Problem Relation Age of Onset   Aneurysm  Mother    Heart disease Father     Allergies  Allergen Reactions   Penicillins Hives, Other (See Comments) and Rash    TOLERATED ROCEPHIN AND CEFAZOLIN  Did it involve swelling of the face/tongue/throat, SOB, or low BP? Yes  Did it involve sudden or severe rash/hives, skin peeling, or any reaction on the inside of your mouth or nose? No  Did you need to seek medical attention at a hospital or doctor's office? No  When did it last happen? Within the past 10 years   If all above answers are "NO", may proceed with cephalosporin use.  Did it involve swelling of the face/tongue/throat, SOB, or low BP? Yes  Did it involve sudden or severe rash/hives, skin peeling, or any reaction on the inside of your mouth or nose? No  Did you need to seek medical  attention at a hospital or doctor's office? No  When did it last happen? Within the past 10 years   If all above answers are "NO", may proceed with cephalosporin use.  Has patient had a PCN reaction causing immediate rash, facial/tongue/throat swelling, SOB or lightheadedness with hypotension: Yes  Has patient had a PCN reaction causing severe rash involving mucus membranes or skin necrosis: No  Has patient had a PCN reaction that required hospitalization: No  Has patient had a PCN reaction occurring within the last 10 years: Yes  If all of the above answers are "NO", then may proceed with Cephalosporin use.  Did it involve swelling of the face/tongue/throat, SOB, or low BP? Yes    Did it involve sudden or severe rash/hives, skin peeling, or any reaction on the inside of your mouth or nose? No    Did you need to seek medical attention at a hospital or doctor's office? No    When did it last happen? Within the past 10 years     If all above answers are "NO", may proceed with cephalosporin use.    Has patient had a PCN reaction causing immediate rash, facial/tongue/throat swelling, SOB or lightheadedness with hypotension: Yes    Has  patient had a PCN reaction causing severe rash involving mucus membranes or skin necrosis: No    Has patient had a PCN reaction that required hospitalization: No    Has patient had a PCN reaction occurring within the last 10 years: Yes    If all of the above answers are "NO", then may proceed with Cephalosporin use.    TOLERATED ROCEPHIN AND CEFAZOLIN  Did it involve swelling of the face/tongue/throat, SOB, or low BP? Yes  Did it involve sudden or severe rash/hives, skin peeling, or any reaction on the inside of your mouth or nose? No  Did you need to seek medical attention at a hospital or doctor's office? No  When did it last happen? Within the past 10 years   If all above answers are "NO", may proceed with cephalosporin use.  Did it involve swelling of the face/tongue/throat, SOB, or low BP? Yes Did it involve sudden or severe rash/hives, skin peeling, or any reaction on the inside of your mouth or nose? No Did you need to seek medical attention at a hospital or docto... (TRUNCATED)   Levofloxacin Other (See Comments)    Dizziness   Oxycodone Itching   Veozah [Fezolinetant] Rash   Vilazodone Other (See Comments)    unknown    Review of Systems  All other systems reviewed and are negative.      Objective:   BP 110/60   Pulse 62   Ht 5\' 1"  (1.549 m)   Wt 236 lb (107 kg)   LMP 04/01/2018 (Exact Date)   SpO2 98%   BMI 44.59 kg/m   Vitals:   09/04/23 1122  BP: 110/60  Pulse: 62  Height: 5\' 1"  (1.549 m)  Weight: 236 lb (107 kg)  SpO2: 98%  BMI (Calculated): 44.61    Physical Exam Vitals and nursing note reviewed.  Constitutional:      Appearance: Normal appearance. She is normal weight.  HENT:     Head: Normocephalic.  Eyes:     Extraocular Movements: Extraocular movements intact.     Conjunctiva/sclera: Conjunctivae normal.     Pupils: Pupils are equal, round, and reactive to light.  Cardiovascular:     Rate and Rhythm: Normal rate.  Pulmonary:  Effort:  Pulmonary effort is normal.  Neurological:     General: No focal deficit present.     Mental Status: She is alert and oriented to person, place, and time. Mental status is at baseline.  Psychiatric:        Mood and Affect: Mood normal.        Behavior: Behavior normal.        Thought Content: Thought content normal.        Judgment: Judgment normal.      Results for orders placed or performed in visit on 09/04/23  POCT Glucose (CBG)  Result Value Ref Range   POC Glucose 103 (A) 70 - 99 mg/dl    Recent Results (from the past 2160 hours)  CBC WITH DIFFERENTIAL     Status: None   Collection Time: 08/15/23  9:07 AM  Result Value Ref Range   WBC 6.7 4.0 - 10.5 K/uL   RBC 4.22 3.87 - 5.11 MIL/uL   Hemoglobin 13.1 12.0 - 15.0 g/dL   HCT 24.4 01.0 - 27.2 %   MCV 95.7 80.0 - 100.0 fL   MCH 31.0 26.0 - 34.0 pg   MCHC 32.4 30.0 - 36.0 g/dL   RDW 53.6 64.4 - 03.4 %   Platelets 339 150 - 400 K/uL   nRBC 0.0 0.0 - 0.2 %   Neutrophils Relative % 62 %   Neutro Abs 4.2 1.7 - 7.7 K/uL   Lymphocytes Relative 25 %   Lymphs Abs 1.7 0.7 - 4.0 K/uL   Monocytes Relative 10 %   Monocytes Absolute 0.7 0.1 - 1.0 K/uL   Eosinophils Relative 2 %   Eosinophils Absolute 0.1 0.0 - 0.5 K/uL   Basophils Relative 1 %   Basophils Absolute 0.1 0.0 - 0.1 K/uL   Immature Granulocytes 0 %   Abs Immature Granulocytes 0.02 0.00 - 0.07 K/uL    Comment: Performed at [facility], [address] [place], [place] [address]  Comprehensive metabolic panel     Status: Abnormal   Collection Time: 08/15/23  9:07 AM  Result Value Ref Range   Sodium 138 135 - 145 mmol/L   Potassium 4.3 3.5 - 5.1 mmol/L   Chloride 100 98 - 111 mmol/L   CO2 30 22 - 32 mmol/L   Glucose, Bld 86 70 - 99 mg/dL    Comment: Glucose reference range applies only to samples taken after fasting for at least 8 hours.   BUN 18 6 - 20 mg/dL   Creatinine, Ser 5.63 0.44 - 1.00 mg/dL   Calcium 8.8 (L) 8.9 - 10.3 mg/dL   Total Protein  7.1 6.5 - 8.1 g/dL   Albumin 4.1 3.5 - 5.0 g/dL   AST 22 15 - 41 U/L   ALT 24 0 - 44 U/L   Alkaline Phosphatase 94 38 - 126 U/L   Total Bilirubin 0.3 0.3 - 1.2 mg/dL   GFR, Estimated >87 >56 mL/min    Comment: (NOTE) Calculated using the CKD-EPI Creatinine Equation (2021)    Anion gap 8 5 - 15    Comment: Performed at [facility], [address] [place], [place] [address]  Surgical pcr screen     Status: None   Collection Time: 08/15/23  9:09 AM   Specimen: Nasal Mucosa; Nasal Swab  Result Value Ref Range   MRSA, PCR NEGATIVE NEGATIVE   Staphylococcus aureus NEGATIVE NEGATIVE    Comment: (NOTE) The Xpert SA Assay (FDA approved for NASAL specimens  in patients 53 years of age and older), is one component of a comprehensive surveillance program. It is not intended to diagnose infection nor to guide or monitor treatment. Performed at [facility], [address] [place], [place] [address]   Urinalysis, Routine w reflex microscopic -Urine, Clean Catch     Status: Abnormal   Collection Time: 08/15/23  9:09 AM  Result Value Ref Range   Color, Urine YELLOW (A) YELLOW   APPearance HAZY (A) CLEAR   Specific Gravity, Urine 1.021 1.005 - 1.030   pH 6.0 5.0 - 8.0   Glucose, UA NEGATIVE NEGATIVE mg/dL   Hgb urine dipstick NEGATIVE NEGATIVE   Bilirubin Urine NEGATIVE NEGATIVE   Ketones, ur NEGATIVE NEGATIVE mg/dL   Protein, ur NEGATIVE NEGATIVE mg/dL   Nitrite NEGATIVE NEGATIVE   Leukocytes,Ua NEGATIVE NEGATIVE    Comment: Performed at [facility], [address] [place], [place] [address]  Glucose, capillary     Status: Abnormal   Collection Time: 08/23/23  6:54 AM  Result Value Ref Range   Glucose-Capillary 62 (L) 70 - 99 mg/dL    Comment: Glucose reference range applies only to samples taken after fasting for at least 8 hours.  Glucose, capillary     Status: Abnormal   Collection Time: 08/23/23  8:24 AM  Result Value Ref Range    Glucose-Capillary 63 (L) 70 - 99 mg/dL    Comment: Glucose reference range applies only to samples taken after fasting for at least 8 hours.  Glucose, capillary     Status: Abnormal   Collection Time: 08/23/23  9:28 AM  Result Value Ref Range   Glucose-Capillary 119 (H) 70 - 99 mg/dL    Comment: Glucose reference range applies only to samples taken after fasting for at least 8 hours.  Glucose, capillary     Status: None   Collection Time: 08/23/23 10:30 AM  Result Value Ref Range   Glucose-Capillary 98 70 - 99 mg/dL    Comment: Glucose reference range applies only to samples taken after fasting for at least 8 hours.  POCT Glucose (CBG)     Status: Abnormal   Collection Time: 09/04/23 11:29 AM  Result Value Ref Range   POC Glucose 103 (A) 70 - 99 mg/dl  POCT Urinalysis Dipstick (24401)     Status: None   Collection Time: 10/25/23 11:45 AM  Result Value Ref Range   Color, UA yellow    Clarity, UA clear    Glucose, UA Negative Negative   Bilirubin, UA neg    Ketones, UA trace    Spec Grav, UA 1.020 1.010 - 1.025   Blood, UA neg    pH, UA 6.0 5.0 - 8.0   Protein, UA Negative Negative   Urobilinogen, UA 0.2 0.2 or 1.0 E.U./dL   Nitrite, UA neg    Leukocytes, UA Negative Negative   Appearance clear    Odor yes   Lipid panel     Status: None   Collection Time: 10/25/23 12:04 PM  Result Value Ref Range   Cholesterol, Total 125 100 - 199 mg/dL   Triglycerides 93 0 - 149 mg/dL   HDL 54 >02 mg/dL   VLDL Cholesterol Cal 18 5 - 40 mg/dL   LDL Chol Calc (NIH) 53 0 - 99 mg/dL   Chol/HDL Ratio 2.3 0.0 - 4.4 ratio    Comment:  T. Chol/HDL Ratio                                             Men  Women                               1/2 Avg.Risk  3.4    3.3                                   Avg.Risk  5.0    4.4                                2X Avg.Risk  9.6    7.1                                3X Avg.Risk 23.4   11.0   VITAMIN D 25 Hydroxy (Vit-D  Deficiency, Fractures)     Status: None   Collection Time: 10/25/23 12:04 PM  Result Value Ref Range   Vit D, 25-Hydroxy 44.9 30.0 - 100.0 ng/mL    Comment: Vitamin D deficiency has been defined by the Institute of Medicine and an Endocrine Society practice guideline as a level of serum 25-OH vitamin D less than 20 ng/mL (1,2). The Endocrine Society went on to further define vitamin D insufficiency as a level between 21 and 29 ng/mL (2). 1. IOM (Institute of Medicine). 2010. Dietary reference    intakes for calcium and D. [place]: The    [employer]. 2. Holick MF, Binkley Tenino, Bischoff-Ferrari HA, et al.    Evaluation, treatment, and prevention of vitamin D    deficiency: an Endocrine Society clinical practice    guideline. JCEM. 2011 Jul; 96(7):1911-30.   CMP14+EGFR     Status: Abnormal   Collection Time: 10/25/23 12:04 PM  Result Value Ref Range   Glucose 82 70 - 99 mg/dL   BUN 20 6 - 24 mg/dL   Creatinine, Ser 9.56 0.57 - 1.00 mg/dL   eGFR 72 >21 HY/QMV/7.84   BUN/Creatinine Ratio 22 9 - 23   Sodium 148 (H) 134 - 144 mmol/L   Potassium 5.6 (H) 3.5 - 5.2 mmol/L   Chloride 104 96 - 106 mmol/L   CO2 23 20 - 29 mmol/L   Calcium 9.9 8.7 - 10.2 mg/dL   Total Protein 6.8 6.0 - 8.5 g/dL   Albumin 4.4 3.8 - 4.9 g/dL   Globulin, Total 2.4 1.5 - 4.5 g/dL   Bilirubin Total 0.3 0.0 - 1.2 mg/dL   Alkaline Phosphatase 128 (H) 44 - 121 IU/L   AST 23 0 - 40 IU/L   ALT 21 0 - 32 IU/L  TSH     Status: None   Collection Time: 10/25/23 12:04 PM  Result Value Ref Range   TSH 1.340 0.450 - 4.500 uIU/mL  Hemoglobin A1c     Status: Abnormal   Collection Time: 10/25/23 12:04 PM  Result Value Ref Range   Hgb A1c MFr Bld 5.8 (H) 4.8 - 5.6 %    Comment:          Prediabetes: 5.7 - 6.4  Diabetes: >6.4          Glycemic control for adults with diabetes: <7.0    Est. average glucose Bld gHb Est-mCnc 120 mg/dL  Vitamin Q46     Status: None   Collection Time: 10/25/23  12:04 PM  Result Value Ref Range   Vitamin B-12 495 232 - 1,245 pg/mL  CBC with Diff     Status: None   Collection Time: 10/25/23 12:04 PM  Result Value Ref Range   WBC 8.2 3.4 - 10.8 x10E3/uL   RBC 4.25 3.77 - 5.28 x10E6/uL   Hemoglobin 12.8 11.1 - 15.9 g/dL   Hematocrit 96.2 95.2 - 46.6 %   MCV 95 79 - 97 fL   MCH 30.1 26.6 - 33.0 pg   MCHC 31.6 31.5 - 35.7 g/dL   RDW 84.1 32.4 - 40.1 %   Platelets 381 150 - 450 x10E3/uL   Neutrophils 56 Not Estab. %   Lymphs 31 Not Estab. %   Monocytes 11 Not Estab. %   Eos 1 Not Estab. %   Basos 1 Not Estab. %   Neutrophils Absolute 4.6 1.4 - 7.0 x10E3/uL   Lymphocytes Absolute 2.5 0.7 - 3.1 x10E3/uL   Monocytes Absolute 0.9 0.1 - 0.9 x10E3/uL   EOS (ABSOLUTE) 0.1 0.0 - 0.4 x10E3/uL   Basophils Absolute 0.1 0.0 - 0.2 x10E3/uL   Immature Granulocytes 0 Not Estab. %   Immature Grans (Abs) 0.0 0.0 - 0.1 x10E3/uL  Hepatitis C Ab reflex to Quant PCR     Status: None   Collection Time: 10/25/23 12:04 PM  Result Value Ref Range   HCV Ab Non Reactive Non Reactive  Interpretation:     Status: None   Collection Time: 10/25/23 12:04 PM  Result Value Ref Range   HCV Interp 1: Comment     Comment: Not infected with HCV unless early or acute infection is suspected (which may be delayed in an immunocompromised individual), or other evidence exists to indicate HCV infection.   Urine Culture     Status: None   Collection Time: 10/25/23  4:05 PM   Specimen: Urine, Clean Catch   UC  Result Value Ref Range   Urine Culture, Routine Final report    Organism ID, Bacteria Comment     Comment: Mixed urogenital flora 10,000-25,000 colony forming units per mL   Urinalysis, Routine w reflex microscopic     Status: None   Collection Time: 10/25/23  4:07 PM  Result Value Ref Range   Specific Gravity, UA 1.022 1.005 - 1.030   pH, UA 6.0 5.0 - 7.5   Color, UA Yellow Yellow   Appearance Ur Clear Clear   Leukocytes,UA Negative Negative   Protein,UA  Negative Negative/Trace   Glucose, UA Negative Negative   Ketones, UA Negative Negative   RBC, UA Negative Negative   Bilirubin, UA Negative Negative   Urobilinogen, Ur 0.2 0.2 - 1.0 mg/dL   Nitrite, UA Negative Negative   Microscopic Examination Comment     Comment: Microscopic not indicated and not performed.       Assessment & Plan:   Problem List Items Addressed This Visit       Cardiovascular and Mediastinum   Essential hypertension, benign   Relevant Medications   pantoprazole (PROTONIX) 40 MG tablet     Endocrine   Type 2 diabetes mellitus with hyperglycemia, without long-term current use of insulin (HCC) - Primary   Relevant Orders   POCT Glucose (CBG) (Completed)  Other   Hyperlipidemia   Relevant Medications   pantoprazole (PROTONIX) 40 MG tablet   Fibromyalgia   Relevant Medications   pantoprazole (PROTONIX) 40 MG tablet   Patient doing well after her previous hospitalization for her surgery. She is recovering well.   Return for F/U.   Total time spent: 30 minutes  Miki Kins, FNP  09/04/2023   This document may have been prepared by [facility] Voice Recognition software and as such may include unintentional dictation errors.

## 2023-11-05 NOTE — Progress Notes (Signed)
Informed via Mychart message

## 2023-11-09 ENCOUNTER — Other Ambulatory Visit: Payer: Self-pay

## 2023-11-09 MED FILL — Tirzepatide Soln Auto-injector 15 MG/0.5ML: SUBCUTANEOUS | 28 days supply | Qty: 2 | Fill #3 | Status: CN

## 2023-11-16 ENCOUNTER — Telehealth: Payer: Self-pay | Admitting: Family

## 2023-11-16 NOTE — Telephone Encounter (Signed)
Patient left VM that she is having severe back pain and her urine has a strong odor that is causing her vaginal area to smell as well. Please advise.

## 2023-11-19 ENCOUNTER — Ambulatory Visit: Payer: 59 | Admitting: Family

## 2023-11-19 ENCOUNTER — Other Ambulatory Visit: Payer: 59

## 2023-11-19 ENCOUNTER — Other Ambulatory Visit: Payer: Self-pay

## 2023-11-19 DIAGNOSIS — Z96611 Presence of right artificial shoulder joint: Secondary | ICD-10-CM | POA: Diagnosis not present

## 2023-11-19 DIAGNOSIS — I259 Chronic ischemic heart disease, unspecified: Secondary | ICD-10-CM

## 2023-11-19 DIAGNOSIS — M545 Low back pain, unspecified: Secondary | ICD-10-CM

## 2023-11-19 DIAGNOSIS — I10 Essential (primary) hypertension: Secondary | ICD-10-CM

## 2023-11-19 DIAGNOSIS — M1811 Unilateral primary osteoarthritis of first carpometacarpal joint, right hand: Secondary | ICD-10-CM | POA: Diagnosis not present

## 2023-11-19 DIAGNOSIS — M7521 Bicipital tendinitis, right shoulder: Secondary | ICD-10-CM | POA: Diagnosis not present

## 2023-11-19 DIAGNOSIS — S46011S Strain of muscle(s) and tendon(s) of the rotator cuff of right shoulder, sequela: Secondary | ICD-10-CM | POA: Diagnosis not present

## 2023-11-19 LAB — POCT URINALYSIS DIPSTICK
Blood, UA: NEGATIVE
Glucose, UA: NEGATIVE
Nitrite, UA: NEGATIVE
Protein, UA: NEGATIVE
Spec Grav, UA: 1.02 (ref 1.010–1.025)
Urobilinogen, UA: 1 U/dL
pH, UA: 7 (ref 5.0–8.0)

## 2023-11-19 MED FILL — Tirzepatide Soln Auto-injector 15 MG/0.5ML: SUBCUTANEOUS | 28 days supply | Qty: 2 | Fill #3 | Status: AC

## 2023-11-20 LAB — POTASSIUM: Potassium: 4.4 mmol/L (ref 3.5–5.2)

## 2023-11-21 LAB — URINE CULTURE

## 2023-11-22 ENCOUNTER — Telehealth: Payer: Self-pay | Admitting: Family

## 2023-11-22 NOTE — Telephone Encounter (Signed)
 Patient left VM want her UA and lab results. Please advise.

## 2023-11-23 DIAGNOSIS — M25621 Stiffness of right elbow, not elsewhere classified: Secondary | ICD-10-CM | POA: Diagnosis not present

## 2023-11-27 DIAGNOSIS — M25621 Stiffness of right elbow, not elsewhere classified: Secondary | ICD-10-CM | POA: Diagnosis not present

## 2023-11-28 NOTE — Telephone Encounter (Signed)
Spoke with patient about potassium being normal and urine results were clear. Patient is still having odor, itching and low back pain. Will come do another urine specimen tomorrow. We discussed doing a back x-ray but she is going to wait and see if her new pain doctor orders one.

## 2023-11-29 NOTE — Progress Notes (Signed)
   CHIEF COMPLAINT  UA/ only visit fot UTI     REASON FOR VISIT  Possible UTI, UA Visit Only      ASSESSMENT & PLAN Diagnoses and all orders for this visit:  Low back pain, unspecified back pain laterality, unspecified chronicity, unspecified whether sciatica present -     POCT Urinalysis Dipstick (09811) -     Urine Culture     Patient notified.  Total time spent: 5 minutes  Miki Kins, FNP 11/19/2023

## 2023-12-02 ENCOUNTER — Other Ambulatory Visit: Payer: Self-pay | Admitting: Family

## 2023-12-03 DIAGNOSIS — M25621 Stiffness of right elbow, not elsewhere classified: Secondary | ICD-10-CM | POA: Diagnosis not present

## 2023-12-06 ENCOUNTER — Ambulatory Visit: Payer: Self-pay | Admitting: Student in an Organized Health Care Education/Training Program

## 2023-12-12 ENCOUNTER — Other Ambulatory Visit: Payer: Self-pay | Admitting: Family

## 2023-12-12 ENCOUNTER — Ambulatory Visit: Payer: 59 | Admitting: Family

## 2023-12-12 DIAGNOSIS — R6889 Other general symptoms and signs: Secondary | ICD-10-CM | POA: Diagnosis not present

## 2023-12-12 LAB — POC INFLUENZA A&B (BINAX/QUICKVUE)
Influenza A, POC: NEGATIVE
Influenza B, POC: NEGATIVE

## 2023-12-12 MED ORDER — METOPROLOL TARTRATE 50 MG PO TABS: 50.0000 mg | ORAL_TABLET | Freq: Two times a day (BID) | ORAL | 1 refills | Status: DC

## 2023-12-13 ENCOUNTER — Encounter: Payer: Self-pay | Admitting: Family

## 2023-12-13 ENCOUNTER — Other Ambulatory Visit: Payer: Self-pay | Admitting: Family

## 2023-12-13 MED ORDER — DOXYCYCLINE HYCLATE 100 MG PO TABS
100.0000 mg | ORAL_TABLET | Freq: Two times a day (BID) | ORAL | 0 refills | Status: DC
Start: 2023-12-13 — End: 2024-04-28

## 2023-12-14 ENCOUNTER — Other Ambulatory Visit: Payer: Self-pay

## 2023-12-14 MED FILL — Tirzepatide Soln Auto-injector 15 MG/0.5ML: SUBCUTANEOUS | 28 days supply | Qty: 2 | Fill #4 | Status: AC

## 2023-12-17 ENCOUNTER — Ambulatory Visit: Payer: 59 | Admitting: Cardiovascular Disease

## 2023-12-18 DIAGNOSIS — M25621 Stiffness of right elbow, not elsewhere classified: Secondary | ICD-10-CM | POA: Diagnosis not present

## 2023-12-21 NOTE — Progress Notes (Signed)
 Sleep Medicine   Office Visit  Patient Name: Terri Wood DOB: 12/18/1965 MRN [id number]    Chief Complaint: sleep evaluation   Brief History:  Terri Wood presents for an initial consult for sleep evaluation and to establish care. Patient has years history of excessive daytimes sleepiness. Sleep quality is poor. This is noted every nights. The patient's bed partner reports  snoring, gasping, choking at night. The patient relates the following symptoms: fatigue, trouble concentrating, and brain fog are also present. The patient goes to sleep at 0930 am and wakes up at 0800 am and will wake up at least once or twice in between with some difficulty returning to sleep.  Sleep quality is the same when outside home environment.  Patient has noted significant movement of her legs at night that would disrupt her sleep.  The patient  relates sleep talking and vivid dreaming as unusual behavior during the night.  The patient relates  depression and OCD as a history of psychiatric problems. The Epworth Sleepiness Score is 12 out of 24 .  The patient relates  Cardiovascular risk factors include: atrial fibrillation, cva, hypertension.  ROS  General: (-) fever, (-) chills, (-) night sweat Nose and Sinuses: (-) nasal stuffiness or itchiness, (-) postnasal drip, (-) nosebleeds, (-) sinus trouble. Mouth and Throat: (-) sore throat, (-) hoarseness. Neck: (-) swollen glands, (-) enlarged thyroid, (-) neck pain. Respiratory: - cough, + shortness of breath, - wheezing. Neurologic: - numbness, - tingling. Psychiatric: + anxiety, + depression Sleep behavior: -sleep paralysis -hypnogogic hallucinations -dream enactment      +vivid dreams -cataplexy -night terrors -sleep walking   Current Medication: Outpatient Encounter Medications as of 12/24/2023  Medication Sig   ACCU-CHEK GUIDE test strip USE TO CHECK BLOOD GLUCOSE ONCE DAILY   amiodarone (PACERONE) 200 MG tablet Take 1 tablet (200 mg total) by mouth daily  as needed.   Ascorbic Acid (VITAMIN C WITH ROSE HIPS) 1000 MG tablet Take 1,000 mg by mouth daily.   ASHWAGANDHA PO Take 1 capsule by mouth in the morning. Gummy   atorvastatin (LIPITOR) 80 MG tablet TAKE 1 TABLET BY MOUTH EVERY DAY FOR CHOLESTEROL   azelastine (ASTELIN) 0.1 % nasal spray Place 2 sprays into both nostrils 2 (two) times daily. Use in each nostril as directed   B Complex-C (B-COMPLEX WITH VITAMIN C) tablet Take 1 tablet by mouth daily.   BIOTIN PO Take 1 drop by mouth. With collagen   BLACK COHOSH EXTRACT PO Take 1 tablet by mouth daily.   buPROPion (WELLBUTRIN XL) 150 MG 24 hr tablet Take 1 tablet (150 mg total) by mouth in the morning.   Cholecalciferol (VITAMIN D3) 125 MCG (5000 UT) CAPS Take 5,000 Units by mouth daily.   cloNIDine (CATAPRES) 0.1 MG tablet Take 0.1 mg by mouth at bedtime.   clopidogrel (PLAVIX) 75 MG tablet Take 1 tablet (75 mg total) by mouth daily.   doxycycline (VIBRAMYCIN) 100 MG capsule Take 1 capsule (100 mg total) by mouth 2 (two) times daily.   DULoxetine (CYMBALTA) 60 MG capsule Take 1 capsule (60 mg total) by mouth 2 (two) times daily.   gabapentin (NEURONTIN) 600 MG tablet Take 1 tablet (600 mg total) by mouth 3 (three) times daily. Take 1 tablet (300 mg) by mouth in the morning & take 2 tablets (600 mg) by mouth at night.   GEMTESA 75 MG TABS TAKE 1 TABLET BY MOUTH AT BEDTIME.   HYDROcodone-acetaminophen (NORCO) 10-325 MG tablet Take 1 tablet by  mouth every 4 (four) hours as needed for moderate pain (pain score 4-6) or severe pain (pain score 7-10) (pain).   Lactobacillus-Inulin (PROBIOTIC DIGESTIVE SUPPORT PO) Take by mouth.   lisinopril-hydrochlorothiazide (ZESTORETIC) 10-12.5 MG tablet Take 1 tablet by mouth daily with supper.   Magnesium 500 MG CAPS Take 500 mg by mouth in the morning.   metoprolol succinate (TOPROL-XL) 25 MG 24 hr tablet TAKE 1 TABLET (25 MG TOTAL) BY MOUTH DAILY.   metoprolol tartrate (LOPRESSOR) 50 MG tablet Take 1 tablet (50  mg total) by mouth 2 (two) times daily.   pantoprazole (PROTONIX) 40 MG tablet Take 1 tablet (40 mg total) by mouth daily.   polyethylene glycol (MIRALAX / GLYCOLAX) 17 g packet Take 17 g by mouth daily as needed for mild constipation.   rivaroxaban (XARELTO) 20 MG TABS tablet Take 1 tablet (20 mg total) by mouth daily with supper.   Sod Fluoride-Potassium Nitrate (PREVIDENT 5000 ENAMEL PROTECT) 1.1-5 % GEL Use paste to brush teeth 2 (two) times daily morning and night. Do not eat or drink 30 minutes after use.   tirzepatide (MOUNJARO) 15 MG/0.5ML Pen Inject 15 mg into the skin once a week.   VITAMIN E PO Take 1 capsule by mouth every evening.   vortioxetine HBr (TRINTELLIX) 5 MG TABS tablet Take 5 mg by mouth in the morning.   [DISCONTINUED] metoprolol succinate (TOPROL-XL) 25 MG 24 hr tablet Take 1 tablet (25 mg total) by mouth daily.   [DISCONTINUED] rivaroxaban (XARELTO) 20 MG TABS tablet Take 1 tablet (20 mg total) by mouth daily with supper.   No facility-administered encounter medications on file as of 12/24/2023.    Surgical History: Past Surgical History:  Procedure Laterality Date   ABDOMINAL SURGERY  1996   gastric bypass; stapling; surgilite   GASTRIC BYPASS OPEN  1996   PLANTAR FASCIA SURGERY Right 2009   REVERSE SHOULDER ARTHROPLASTY Right 08/23/2023   Procedure: REVERSE SHOULDER ARTHROPLASTY WITH BICEPS TENODESIS;  Surgeon: Christena Flake, MD;  Location: [facility];  Service: Orthopedics;  Laterality: Right;   TARSAL TUNNEL RELEASE Right 2009   TOTAL HIP ARTHROPLASTY Left 04/09/2018   Procedure: TOTAL HIP ARTHROPLASTY ANTERIOR APPROACH;  Surgeon: Kennedy Bucker, MD;  Location: [facility];  Service: Orthopedics;  Laterality: Left;   TOTAL KNEE ARTHROPLASTY Right 07/12/2021   Procedure: TOTAL KNEE ARTHROPLASTY;  Surgeon: Kennedy Bucker, MD;  Location: [facility];  Service: Orthopedics;  Laterality: Right;   TOTAL KNEE ARTHROPLASTY Left 02/16/2022   Procedure: TOTAL KNEE ARTHROPLASTY;   Surgeon: Kennedy Bucker, MD;  Location: [facility];  Service: Orthopedics;  Laterality: Left;    Medical History: Past Medical History:  Diagnosis Date   Acute ischemic right MCA stroke (HCC) 08/15/2019   Acute respiratory failure (HCC) 01/16/2021   a.) in setting of CAP   Anxiety    Aortic atherosclerosis (HCC)    Atrial fibrillation (HCC)    a.) CHA2DS2-VASc = 5 (sex, HTN, CVA x2, vascular disease history) as of 08/20/2023; b.) cardiac rate/rhythm maintained on oral amiodarone + metoprolol; chronically anticoagulated using rivaroxaban; on chronic clopidogrel   Avascular necrosis of left femoral head (HCC) 04/09/2018   CAD (coronary artery disease) 03/13/2023   a.) MV 03/13/2023: small mild reversible bas/mid anterosep/inferosep/apex defects c/w isch; b.) cCTA 03/29/2023: Ca2+ = 673.3 (99th %'ile)   Carotid artery disease (HCC) 08/16/2019   a.) doppler 08/16/2019: 40-59% LICA   Chronic midline low back pain with right-sided sciatica 02/11/2016   Community acquired pneumonia 01/16/2021  Depression    Essential hypertension    Fibromyalgia    Foraminal stenosis of lumbar region 02/27/2018   GERD (gastroesophageal reflux disease)    Long term current use of amiodarone    Lumbar radiculopathy 12/15/2016   Mixed hyperlipidemia    Multifocal pneumonia 06/05/2021   OAB (overactive bladder)    On chronic clopidogrel therapy    On rivaroxaban therapy    Osteoarthritis of left hip 2019   Restless leg syndrome 02/02/2015   Rotator cuff tendinitis, right 06/04/2023   Sepsis (HCC) 01/16/2021   T2DM (type 2 diabetes mellitus) (HCC)    Tendinitis of upper biceps tendon of right shoulder 06/04/2023   Traumatic complete tear of right rotator cuff 06/04/2023    Family History: Non contributory to the present illness  Social History: Social History   Socioeconomic History   Marital status: Legally Separated    Spouse name: Not on file   Number of children: 0   Years of education: Not  on file   Highest education level: Not on file  Occupational History   Not on file  Tobacco Use   Smoking status: Former    Current packs/day: 0.00    Types: Cigarettes    Quit date: 07/2019    Years since quitting: 4.4   Smokeless tobacco: Never  Vaping Use   Vaping status: Never Used  Substance and Sexual Activity   Alcohol use: Not Currently   Drug use: Not Currently    Types: Marijuana    Comment: none since 2019   Sexual activity: Not Currently  Other Topics Concern   Not on file  Social History Narrative   Lives with mother   Social Drivers of Health   Financial Resource Strain: Low Risk  (01/16/2022)   Received from [facility]   Overall Financial Resource Strain (CARDIA)    Difficulty of Paying Living Expenses: Not very hard  Food Insecurity: Unknown (01/16/2022)   Received from [facility]   Hunger Vital Sign    Worried About Running Out of Food in the Last Year: Never true    Ran Out of Food in the Last Year: Not on file  Transportation Needs: No Transportation Needs (01/16/2022)   Received from [facility] - Transportation    In the past 12 months, has lack of transportation kept you from medical appointments or from getting medications?: No    Lack of Transportation (Non-Medical): No  Physical Activity: Inactive (01/16/2022)   Received from [facility]   Exercise Vital Sign    Days of Exercise per Week: 0 days    Minutes of Exercise per Session: 0 min  Stress: No Stress Concern Present (01/16/2022)   Received from [facility] - Occupational Stress Questionnaire    Feeling of Stress : Not at all  Social Connections: Moderately Integrated (01/16/2022)   Received from [facility]   Social Connection and Isolation Panel [NHANES]    Frequency of Communication with Friends and Family: More than three times a week     Frequency of Social Gatherings with Friends and Family: Never    Attends Religious Services: More than 4 times per year    Active Member of [employer] or Organizations: Yes    Attends Engineer, structural: More than 4 times per year    Marital Status: Separated  Intimate Partner Violence: Not on file  Vital Signs: Blood pressure 102/69, pulse (!) 59, resp. rate 16, height 5\' 1"  (1.549 m), weight 230 lb (104.3 kg), last menstrual period 04/01/2018, SpO2 96%. Body mass index is 43.46 kg/m.   Examination: General Appearance: The patient is well-developed, well-nourished, and in no distress. Neck Circumference: 41 cm Skin: Gross inspection of skin unremarkable. Head: normocephalic, no gross deformities. Eyes: no gross deformities noted. ENT: ears appear grossly normal Neurologic: Alert and oriented. No involuntary movements.    STOP BANG RISK ASSESSMENT S (snore) Have you been told that you snore?     YES   T (tired) Are you often tired, fatigued, or sleepy during the day?   YES  O (obstruction) Do you stop breathing, choke, or gasp during sleep? YES   P (pressure) Do you have or are you being treated for high blood pressure? YES   B (BMI) Is your body index greater than 35 kg/m? YES   A (age) Are you 53 years old or older? YES   N (neck) Do you have a neck circumference greater than 16 inches?   NO   G (gender) Are you a female? NO   TOTAL STOP/BANG "YES" ANSWERS 6                                                               A STOP-Bang score of 2 or less is considered low risk, and a score of 5 or more is high risk for having either moderate or severe OSA. For people who score 3 or 4, doctors may need to perform further assessment to determine how likely they are to have OSA.         EPWORTH SLEEPINESS SCALE:  Scale:  (0)= no chance of dozing; (1)= slight chance of dozing; (2)= moderate chance of dozing; (3)= high chance of dozing  Chance  Situtation     Sitting and reading: 2    Watching TV: 2    Sitting Inactive in public: 1    As a passenger in car: 3      Lying down to rest: 2    Sitting and talking: 0    Sitting quielty after lunch: 2    In a car, stopped in traffic: 0   TOTAL SCORE:   12 out of 24    SLEEP STUDIES:  None   LABS: Recent Results (from the past 2160 hours)  POCT Urinalysis Dipstick (65784)     Status: None   Collection Time: 10/25/23 11:45 AM  Result Value Ref Range   Color, UA yellow    Clarity, UA clear    Glucose, UA Negative Negative   Bilirubin, UA neg    Ketones, UA trace    Spec Grav, UA 1.020 1.010 - 1.025   Blood, UA neg    pH, UA 6.0 5.0 - 8.0   Protein, UA Negative Negative   Urobilinogen, UA 0.2 0.2 or 1.0 E.U./dL   Nitrite, UA neg    Leukocytes, UA Negative Negative   Appearance clear    Odor yes   Lipid panel     Status: None   Collection Time: 10/25/23 12:04 PM  Result Value Ref Range   Cholesterol, Total 125 100 - 199 mg/dL   Triglycerides 93 0 - 149 mg/dL  HDL 54 >39 mg/dL   VLDL Cholesterol Cal 18 5 - 40 mg/dL   LDL Chol Calc (NIH) 53 0 - 99 mg/dL   Chol/HDL Ratio 2.3 0.0 - 4.4 ratio    Comment:                                   T. Chol/HDL Ratio                                             Men  Women                               1/2 Avg.Risk  3.4    3.3                                   Avg.Risk  5.0    4.4                                2X Avg.Risk  9.6    7.1                                3X Avg.Risk 23.4   11.0   VITAMIN D 25 Hydroxy (Vit-D Deficiency, Fractures)     Status: None   Collection Time: 10/25/23 12:04 PM  Result Value Ref Range   Vit D, 25-Hydroxy 44.9 30.0 - 100.0 ng/mL    Comment: Vitamin D deficiency has been defined by the Institute of Medicine and an Endocrine Society practice guideline as a level of serum 25-OH vitamin D less than 20 ng/mL (1,2). The Endocrine Society went on to further define vitamin D insufficiency as a level between  21 and 29 ng/mL (2). 1. IOM (Institute of Medicine). 2010. Dietary reference    intakes for calcium and D. [place]: The    [employer]. 2. Holick MF, Binkley Buckingham, Bischoff-Ferrari HA, et al.    Evaluation, treatment, and prevention of vitamin D    deficiency: an Endocrine Society clinical practice    guideline. JCEM. 2011 Jul; 96(7):1911-30.   CMP14+EGFR     Status: Abnormal   Collection Time: 10/25/23 12:04 PM  Result Value Ref Range   Glucose 82 70 - 99 mg/dL   BUN 20 6 - 24 mg/dL   Creatinine, Ser 1.61 0.57 - 1.00 mg/dL   eGFR 72 >09 UE/AVW/0.98   BUN/Creatinine Ratio 22 9 - 23   Sodium 148 (H) 134 - 144 mmol/L   Potassium 5.6 (H) 3.5 - 5.2 mmol/L   Chloride 104 96 - 106 mmol/L   CO2 23 20 - 29 mmol/L   Calcium 9.9 8.7 - 10.2 mg/dL   Total Protein 6.8 6.0 - 8.5 g/dL   Albumin 4.4 3.8 - 4.9 g/dL   Globulin, Total 2.4 1.5 - 4.5 g/dL   Bilirubin Total 0.3 0.0 - 1.2 mg/dL   Alkaline Phosphatase 128 (H) 44 - 121 IU/L   AST 23 0 - 40 IU/L   ALT 21 0 - 32 IU/L  TSH     Status:  None   Collection Time: 10/25/23 12:04 PM  Result Value Ref Range   TSH 1.340 0.450 - 4.500 uIU/mL  Hemoglobin A1c     Status: Abnormal   Collection Time: 10/25/23 12:04 PM  Result Value Ref Range   Hgb A1c MFr Bld 5.8 (H) 4.8 - 5.6 %    Comment:          Prediabetes: 5.7 - 6.4          Diabetes: >6.4          Glycemic control for adults with diabetes: <7.0    Est. average glucose Bld gHb Est-mCnc 120 mg/dL  Vitamin V78     Status: None   Collection Time: 10/25/23 12:04 PM  Result Value Ref Range   Vitamin B-12 495 232 - 1,245 pg/mL  CBC with Diff     Status: None   Collection Time: 10/25/23 12:04 PM  Result Value Ref Range   WBC 8.2 3.4 - 10.8 x10E3/uL   RBC 4.25 3.77 - 5.28 x10E6/uL   Hemoglobin 12.8 11.1 - 15.9 g/dL   Hematocrit 46.9 62.9 - 46.6 %   MCV 95 79 - 97 fL   MCH 30.1 26.6 - 33.0 pg   MCHC 31.6 31.5 - 35.7 g/dL   RDW 52.8 41.3 - 24.4 %   Platelets 381 150 - 450  x10E3/uL   Neutrophils 56 Not Estab. %   Lymphs 31 Not Estab. %   Monocytes 11 Not Estab. %   Eos 1 Not Estab. %   Basos 1 Not Estab. %   Neutrophils Absolute 4.6 1.4 - 7.0 x10E3/uL   Lymphocytes Absolute 2.5 0.7 - 3.1 x10E3/uL   Monocytes Absolute 0.9 0.1 - 0.9 x10E3/uL   EOS (ABSOLUTE) 0.1 0.0 - 0.4 x10E3/uL   Basophils Absolute 0.1 0.0 - 0.2 x10E3/uL   Immature Granulocytes 0 Not Estab. %   Immature Grans (Abs) 0.0 0.0 - 0.1 x10E3/uL  Hepatitis C Ab reflex to Quant PCR     Status: None   Collection Time: 10/25/23 12:04 PM  Result Value Ref Range   HCV Ab Non Reactive Non Reactive  Interpretation:     Status: None   Collection Time: 10/25/23 12:04 PM  Result Value Ref Range   HCV Interp 1: Comment     Comment: Not infected with HCV unless early or acute infection is suspected (which may be delayed in an immunocompromised individual), or other evidence exists to indicate HCV infection.   Urine Culture     Status: None   Collection Time: 10/25/23  4:05 PM   Specimen: Urine, Clean Catch   UC  Result Value Ref Range   Urine Culture, Routine Final report    Organism ID, Bacteria Comment     Comment: Mixed urogenital flora 10,000-25,000 colony forming units per mL   Urinalysis, Routine w reflex microscopic     Status: None   Collection Time: 10/25/23  4:07 PM  Result Value Ref Range   Specific Gravity, UA 1.022 1.005 - 1.030   pH, UA 6.0 5.0 - 7.5   Color, UA Yellow Yellow   Appearance Ur Clear Clear   Leukocytes,UA Negative Negative   Protein,UA Negative Negative/Trace   Glucose, UA Negative Negative   Ketones, UA Negative Negative   RBC, UA Negative Negative   Bilirubin, UA Negative Negative   Urobilinogen, Ur 0.2 0.2 - 1.0 mg/dL   Nitrite, UA Negative Negative   Microscopic Examination Comment     Comment: Microscopic not  indicated and not performed.  POCT Urinalysis Dipstick (40981)     Status: Abnormal   Collection Time: 11/19/23 11:45 AM  Result Value Ref Range    Color, UA orange    Clarity, UA cloudy    Glucose, UA Negative Negative   Bilirubin, UA small    Ketones, UA trace    Spec Grav, UA 1.020 1.010 - 1.025   Blood, UA neg    pH, UA 7.0 5.0 - 8.0   Protein, UA Negative Negative   Urobilinogen, UA 1.0 0.2 or 1.0 E.U./dL   Nitrite, UA neg    Leukocytes, UA Trace (A) Negative   Appearance cloudy    Odor yes   Urine Culture     Status: None   Collection Time: 11/19/23  2:00 PM   Specimen: Urine   UR  Result Value Ref Range   Urine Culture, Routine Final report    Organism ID, Bacteria Lactobacillus species     Comment: 50,000-100,000 colony forming units per mL Susceptibility not normally performed on this organism.   Potassium     Status: None   Collection Time: 11/19/23  2:06 PM  Result Value Ref Range   Potassium 4.4 3.5 - 5.2 mmol/L  POC Influenza A&B (Binax test)     Status: None   Collection Time: 12/12/23 11:49 AM  Result Value Ref Range   Influenza A, POC Negative Negative   Influenza B, POC Negative Negative    Radiology: DG Shoulder Right Port Result Date: 08/23/2023 CLINICAL DATA:  Status post reverse total right shoulder arthroplasty. EXAM: RIGHT SHOULDER - 1 VIEW COMPARISON:  MRI right shoulder 05/03/2023 FINDINGS: Interval reverse total right shoulder arthroplasty. No perihardware lucency is seen to indicate hardware failure or loosening. Expected postoperative changes including subcutaneous air and anterior surgical skin staples. Normal alignment of the acromioclavicular joint without significant degenerative change. No acute fracture or dislocation. IMPRESSION: Interval reverse total right shoulder arthroplasty without evidence of hardware failure. Electronically Signed   By: Neita Garnet M.D.   On: 08/23/2023 12:41   [place] OR NERVE BLOCK-IMAGE ONLY [facility]) Result Date: 08/23/2023 There is no interpretation for this exam.  This order is for images obtained during a surgical procedure.  Please See "Surgeries" Tab for  more information regarding the procedure.    No results found.  No results found.    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   S/P TKR (total knee replacement) using cement, right 07/12/2021   Generalized weakness 06/05/2021   Hyperlipidemia 04/27/2021   CVA, old, hemiparesis (HCC) 01/16/2021   Carotid artery stenosis 08/05/2019   Cerebrovascular disease 08/05/2019   Type 2 diabetes mellitus with hyperglycemia, without long-term current use of insulin (HCC) 04/07/2019   Myalgia 11/08/2018   Avascular necrosis of left femoral head (HCC) 04/09/2018   Pain, joint, multiple sites 02/28/2018   Primary osteoarthritis of right knee 08/07/2017   Current smoker 02/25/2017   Foraminal stenosis of lumbar region 12/15/2016   Perimenopausal symptoms 12/15/2016   Essential hypertension, benign 02/11/2016   Chronic midline low back pain with right-sided sciatica 02/11/2016   Body mass index  40.0-44.9, adult (HCC) 02/11/2016   Numbness and tingling in both hands 02/11/2016   Sciatica 06/25/2015   Major depressive disorder, recurrent episode, moderate (HCC) 02/02/2015   Restless legs syndrome 02/02/2015   Fibromyalgia 02/02/2015   1. Hypersomnia (Primary)   Patient evaluation suggests high risk of sleep disordered breathing due to snoring, gasping, choking, daytime sleepiness, morbid obesity.  Patient has comorbid cardiovascular risk factors including: hypertension, atrial fibrillation, stroke which could be exacerbated by pathologic sleep-disordered breathing.  Suggest: PSG  to  assess/treat the patient's sleep disordered breathing. The patient was also counselled on weight loss to optimize sleep health.  2. Morbid obesity  Obesity Counseling: Had a lengthy discussion regarding patients BMI and weight issues. Patient was instructed on portion control as well as increased activity. Also discussed caloric restrictions with trying to maintain intake less than 2000 Kcal. Discussions were made in accordance with the 5As of weight management. Simple actions such as not eating late and if able to, taking a walk is suggested.  3. Hypertension Controlled with metoprolol and lisinopril. Continue.   General Counseling: I have discussed the findings of the evaluation and examination with Rinaldo Cloud.  I have also discussed any further diagnostic evaluation thatmay be needed or ordered today. Jailynn verbalizes understanding of the findings of todays visit. We also reviewed her medications today and discussed drug interactions and side effects including but not limited excessive drowsiness and altered mental states. We also discussed that there is always a risk not just to her but also people around her. she has been encouraged to call the office with any questions or concerns that should arise related to todays visit.  No orders of the defined types were placed in this encounter.       I have personally obtained a history, evaluated the patient, evaluated pertinent data, formulated the assessment and plan and placed orders.  This patient was seen today by Emmaline Kluver, PA-C in collaboration with Dr. Freda Munro.    Yevonne Pax, MD [facility] Diplomate ABMS Pulmonary and Critical Care Medicine Sleep medicine

## 2023-12-23 NOTE — Progress Notes (Signed)
   Subjective   CHIEF COMPLAINT  Flu Testing    REASON FOR VISIT  Flu testing for URI symptoms.        Objective   Results for orders placed or performed in visit on 12/12/23  POC Influenza A&B (Binax test)  Result Value Ref Range   Influenza A, POC Negative Negative   Influenza B, POC Negative Negative    Assessment & Plan  Problem List Items Addressed This Visit   None Visit Diagnoses       Flu-like symptoms    -  Primary   Relevant Orders   POC Influenza A&B (Binax test) (Completed)        Total time spent: 5 minutes  Miki Kins, FNP 12/12/2023

## 2023-12-23 NOTE — Assessment & Plan Note (Signed)
 Blood pressure well controlled with current medications.  Continue current therapy.  Will reassess at follow up.

## 2023-12-23 NOTE — Assessment & Plan Note (Signed)
 Checking labs today.  Continue current therapy for lipid control. Will modify as needed based on labwork results.

## 2023-12-23 NOTE — Assessment & Plan Note (Signed)
 Checking labs today. Will call pt. With results  Continue current diabetes POC, as patient has been well controlled on current regimen.  Will adjust meds if needed based on labs.

## 2023-12-24 ENCOUNTER — Ambulatory Visit (INDEPENDENT_AMBULATORY_CARE_PROVIDER_SITE_OTHER): Payer: Self-pay | Admitting: Internal Medicine

## 2023-12-24 ENCOUNTER — Other Ambulatory Visit: Payer: Self-pay | Admitting: Family

## 2023-12-24 ENCOUNTER — Ambulatory Visit (INDEPENDENT_AMBULATORY_CARE_PROVIDER_SITE_OTHER): Payer: 59 | Admitting: Cardiovascular Disease

## 2023-12-24 ENCOUNTER — Encounter: Payer: Self-pay | Admitting: Cardiovascular Disease

## 2023-12-24 VITALS — BP 102/69 | HR 59 | Resp 16 | Ht 61.0 in | Wt 230.0 lb

## 2023-12-24 VITALS — BP 137/83 | HR 66 | Ht 61.0 in | Wt 230.0 lb

## 2023-12-24 DIAGNOSIS — R42 Dizziness and giddiness: Secondary | ICD-10-CM | POA: Diagnosis not present

## 2023-12-24 DIAGNOSIS — I63511 Cerebral infarction due to unspecified occlusion or stenosis of right middle cerebral artery: Secondary | ICD-10-CM | POA: Diagnosis not present

## 2023-12-24 DIAGNOSIS — E782 Mixed hyperlipidemia: Secondary | ICD-10-CM | POA: Diagnosis not present

## 2023-12-24 DIAGNOSIS — I10 Essential (primary) hypertension: Secondary | ICD-10-CM

## 2023-12-24 DIAGNOSIS — E1165 Type 2 diabetes mellitus with hyperglycemia: Secondary | ICD-10-CM

## 2023-12-24 DIAGNOSIS — Z8679 Personal history of other diseases of the circulatory system: Secondary | ICD-10-CM | POA: Diagnosis not present

## 2023-12-24 DIAGNOSIS — G4733 Obstructive sleep apnea (adult) (pediatric): Secondary | ICD-10-CM | POA: Diagnosis not present

## 2023-12-24 DIAGNOSIS — I6523 Occlusion and stenosis of bilateral carotid arteries: Secondary | ICD-10-CM

## 2023-12-24 DIAGNOSIS — G4710 Hypersomnia, unspecified: Secondary | ICD-10-CM

## 2023-12-24 MED ORDER — RIVAROXABAN 20 MG PO TABS: 20.0000 mg | ORAL_TABLET | Freq: Every day | ORAL | 3 refills | Status: AC

## 2023-12-24 NOTE — Progress Notes (Signed)
 Cardiology Office Note   Date:  12/24/2023   ID:  Terri Wood, DOB [birth year], MRN [id number]  PCP:  Miki Kins, FNP  Cardiologist:  Adrian Blackwater, MD      History of Present Illness: Terri Wood is a 58 y.o. female who presents for  Chief Complaint  Patient presents with   Follow-up    3 month follow up     Feels tired      Past Medical History:  Diagnosis Date   Acute ischemic right MCA stroke (HCC) 08/15/2019   Acute respiratory failure (HCC) 01/16/2021   a.) in setting of CAP   Anxiety    Aortic atherosclerosis (HCC)    Atrial fibrillation (HCC)    a.) CHA2DS2-VASc = 5 (sex, HTN, CVA x2, vascular disease history) as of 08/20/2023; b.) cardiac rate/rhythm maintained on oral amiodarone + metoprolol; chronically anticoagulated using rivaroxaban; on chronic clopidogrel   Avascular necrosis of left femoral head (HCC) 04/09/2018   CAD (coronary artery disease) 03/13/2023   a.) MV 03/13/2023: small mild reversible bas/mid anterosep/inferosep/apex defects c/w isch; b.) cCTA 03/29/2023: Ca2+ = 673.3 (99th %'ile)   Carotid artery disease (HCC) 08/16/2019   a.) doppler 08/16/2019: 40-59% LICA   Chronic midline low back pain with right-sided sciatica 02/11/2016   Community acquired pneumonia 01/16/2021   Depression    Essential hypertension    Fibromyalgia    Foraminal stenosis of lumbar region 02/27/2018   GERD (gastroesophageal reflux disease)    Long term current use of amiodarone    Lumbar radiculopathy 12/15/2016   Mixed hyperlipidemia    Multifocal pneumonia 06/05/2021   OAB (overactive bladder)    On chronic clopidogrel therapy    On rivaroxaban therapy    Osteoarthritis of left hip 2019   Restless leg syndrome 02/02/2015   Rotator cuff tendinitis, right 06/04/2023   Sepsis (HCC) 01/16/2021   T2DM (type 2 diabetes mellitus) (HCC)    Tendinitis of upper biceps tendon of right shoulder 06/04/2023   Traumatic complete tear of right rotator cuff  06/04/2023     Past Surgical History:  Procedure Laterality Date   ABDOMINAL SURGERY  1996   gastric bypass; stapling; surgilite   GASTRIC BYPASS OPEN  1996   PLANTAR FASCIA SURGERY Right 2009   REVERSE SHOULDER ARTHROPLASTY Right 08/23/2023   Procedure: REVERSE SHOULDER ARTHROPLASTY WITH BICEPS TENODESIS;  Surgeon: Christena Flake, MD;  Location: [facility];  Service: Orthopedics;  Laterality: Right;   TARSAL TUNNEL RELEASE Right 2009   TOTAL HIP ARTHROPLASTY Left 04/09/2018   Procedure: TOTAL HIP ARTHROPLASTY ANTERIOR APPROACH;  Surgeon: Kennedy Bucker, MD;  Location: [facility];  Service: Orthopedics;  Laterality: Left;   TOTAL KNEE ARTHROPLASTY Right 07/12/2021   Procedure: TOTAL KNEE ARTHROPLASTY;  Surgeon: Kennedy Bucker, MD;  Location: [facility];  Service: Orthopedics;  Laterality: Right;   TOTAL KNEE ARTHROPLASTY Left 02/16/2022   Procedure: TOTAL KNEE ARTHROPLASTY;  Surgeon: Kennedy Bucker, MD;  Location: [facility];  Service: Orthopedics;  Laterality: Left;     Current Outpatient Medications  Medication Sig Dispense Refill   ACCU-CHEK GUIDE test strip USE TO CHECK BLOOD GLUCOSE ONCE DAILY 100 strip 1   amiodarone (PACERONE) 200 MG tablet Take 1 tablet (200 mg total) by mouth daily as needed.     Ascorbic Acid (VITAMIN C WITH ROSE HIPS) 1000 MG tablet Take 1,000 mg by mouth daily.     ASHWAGANDHA PO Take 1 capsule by mouth in the morning. Gummy  atorvastatin (LIPITOR) 80 MG tablet TAKE 1 TABLET BY MOUTH EVERY DAY FOR CHOLESTEROL 90 tablet 3   azelastine (ASTELIN) 0.1 % nasal spray Place 2 sprays into both nostrils 2 (two) times daily. Use in each nostril as directed 30 mL 12   B Complex-C (B-COMPLEX WITH VITAMIN C) tablet Take 1 tablet by mouth daily.     BIOTIN PO Take 1 drop by mouth. With collagen     BLACK COHOSH EXTRACT PO Take 1 tablet by mouth daily.     buPROPion (WELLBUTRIN XL) 150 MG 24 hr tablet Take 1 tablet (150 mg total) by mouth in the morning. 90 tablet 1    Cholecalciferol (VITAMIN D3) 125 MCG (5000 UT) CAPS Take 5,000 Units by mouth daily.     cloNIDine (CATAPRES) 0.1 MG tablet Take 0.1 mg by mouth at bedtime.     clopidogrel (PLAVIX) 75 MG tablet Take 1 tablet (75 mg total) by mouth daily. 90 tablet 1   doxycycline (VIBRAMYCIN) 100 MG capsule Take 1 capsule (100 mg total) by mouth 2 (two) times daily. 14 capsule 0   DULoxetine (CYMBALTA) 60 MG capsule Take 1 capsule (60 mg total) by mouth 2 (two) times daily. 180 capsule 1   gabapentin (NEURONTIN) 600 MG tablet Take 1 tablet (600 mg total) by mouth 3 (three) times daily. Take 1 tablet (300 mg) by mouth in the morning & take 2 tablets (600 mg) by mouth at night.     GEMTESA 75 MG TABS TAKE 1 TABLET BY MOUTH AT BEDTIME. 90 tablet 1   HYDROcodone-acetaminophen (NORCO) 10-325 MG tablet Take 1 tablet by mouth every 4 (four) hours as needed for moderate pain (pain score 4-6) or severe pain (pain score 7-10) (pain). 40 tablet 0   Lactobacillus-Inulin (PROBIOTIC DIGESTIVE SUPPORT PO) Take by mouth.     lisinopril-hydrochlorothiazide (ZESTORETIC) 10-12.5 MG tablet Take 1 tablet by mouth daily with supper.     Magnesium 500 MG CAPS Take 500 mg by mouth in the morning.     metoprolol succinate (TOPROL-XL) 25 MG 24 hr tablet TAKE 1 TABLET (25 MG TOTAL) BY MOUTH DAILY. 90 tablet 1   metoprolol tartrate (LOPRESSOR) 50 MG tablet Take 1 tablet (50 mg total) by mouth 2 (two) times daily. 180 tablet 1   pantoprazole (PROTONIX) 40 MG tablet Take 1 tablet (40 mg total) by mouth daily. 90 tablet 1   polyethylene glycol (MIRALAX / GLYCOLAX) 17 g packet Take 17 g by mouth daily as needed for mild constipation. 14 each 0   rivaroxaban (XARELTO) 20 MG TABS tablet Take 1 tablet (20 mg total) by mouth daily with supper. 30 tablet 3   Sod Fluoride-Potassium Nitrate (PREVIDENT 5000 ENAMEL PROTECT) 1.1-5 % GEL Use paste to brush teeth 2 (two) times daily morning and night. Do not eat or drink 30 minutes after use. 100 mL 3    tirzepatide (MOUNJARO) 15 MG/0.5ML Pen Inject 15 mg into the skin once a week. 6 mL 1   VITAMIN E PO Take 1 capsule by mouth every evening.     vortioxetine HBr (TRINTELLIX) 5 MG TABS tablet Take 5 mg by mouth in the morning.     No current facility-administered medications for this visit.    Allergies:   Penicillins, Levofloxacin, Oxycodone, Veozah [fezolinetant], and Vilazodone    Social History:   reports that she quit smoking about 4 years ago. Her smoking use included cigarettes. She has never used smokeless tobacco. She reports that she does not  currently use alcohol. She reports that she does not currently use drugs after having used the following drugs: Marijuana.   Family History:  family history includes Aneurysm in her mother; Heart disease in her father.    ROS:     Review of Systems  Constitutional: Negative.   HENT: Negative.    Eyes: Negative.   Respiratory: Negative.    Gastrointestinal: Negative.   Genitourinary: Negative.   Musculoskeletal: Negative.   Skin: Negative.   Neurological: Negative.   Endo/Heme/Allergies: Negative.   Psychiatric/Behavioral: Negative.    All other systems reviewed and are negative.     All other systems are reviewed and negative.    PHYSICAL EXAM: VS:  BP 137/83   Pulse 66   Ht 5\' 1"  (1.549 m)   Wt 230 lb (104.3 kg)   LMP 04/01/2018 (Exact Date)   SpO2 98%   BMI 43.46 kg/m  , BMI Body mass index is 43.46 kg/m. Last weight:  Wt Readings from Last 3 Encounters:  12/24/23 230 lb (104.3 kg)  10/25/23 230 lb (104.3 kg)  10/15/23 230 lb (104.3 kg)     Physical Exam Constitutional:      Appearance: Normal appearance.  Cardiovascular:     Rate and Rhythm: Normal rate and regular rhythm.     Heart sounds: Normal heart sounds.  Pulmonary:     Effort: Pulmonary effort is normal.     Breath sounds: Normal breath sounds.  Musculoskeletal:     Right lower leg: No edema.     Left lower leg: No edema.  Neurological:      Mental Status: She is alert.       EKG:   Recent Labs: 10/25/2023: ALT 21; BUN 20; Creatinine, Ser 0.93; Hemoglobin 12.8; Platelets 381; Sodium 148; TSH 1.340 11/19/2023: Potassium 4.4    Lipid Panel    Component Value Date/Time   CHOL 125 10/25/2023 1204   TRIG 93 10/25/2023 1204   HDL 54 10/25/2023 1204   CHOLHDL 2.3 10/25/2023 1204   CHOLHDL 3.6 08/16/2019 0516   VLDL 26 08/16/2019 0516   LDLCALC 53 10/25/2023 1204      Other studies Reviewed: Additional studies/ records that were reviewed today include:  Review of the above records demonstrates:       No data to display            ASSESSMENT AND PLAN:    ICD-10-CM   1. Obstructive sleep apnea  G47.33    has consult for it, as BP and tiredness will get better once treated    2. Essential hypertension, benign  I10 rivaroxaban (XARELTO) 20 MG TABS tablet    3. Bilateral carotid artery stenosis  I65.23 rivaroxaban (XARELTO) 20 MG TABS tablet    4. Atrial fibrillation, currently in sinus rhythm  Z86.79 rivaroxaban (XARELTO) 20 MG TABS tablet   Had hives with Elliquis, and changed to xarelto 20    5. Mixed hyperlipidemia  E78.2 rivaroxaban (XARELTO) 20 MG TABS tablet    6. Acute ischemic right MCA stroke (HCC)  I63.511 rivaroxaban (XARELTO) 20 MG TABS tablet    7. Type 2 diabetes mellitus with hyperglycemia, without long-term current use of insulin (HCC)  E11.65 rivaroxaban (XARELTO) 20 MG TABS tablet    8. Dizziness  R42 rivaroxaban (XARELTO) 20 MG TABS tablet   Dizzy and tired, decrease amio 200 and BP meds, if still symptomatic, do cath       Problem List Items Addressed This Visit  Cardiovascular and Mediastinum   Essential hypertension, benign   Relevant Medications   rivaroxaban (XARELTO) 20 MG TABS tablet   Carotid artery stenosis   Relevant Medications   rivaroxaban (XARELTO) 20 MG TABS tablet     Endocrine   Type 2 diabetes mellitus with hyperglycemia, without long-term current use of  insulin (HCC)   Relevant Medications   rivaroxaban (XARELTO) 20 MG TABS tablet     Other   Hyperlipidemia   Relevant Medications   rivaroxaban (XARELTO) 20 MG TABS tablet   Other Visit Diagnoses       Obstructive sleep apnea    -  Primary   has consult for it, as BP and tiredness will get better once treated     Atrial fibrillation, currently in sinus rhythm       Had hives with Elliquis, and changed to xarelto 20   Relevant Medications   rivaroxaban (XARELTO) 20 MG TABS tablet     Acute ischemic right MCA stroke (HCC)       Relevant Medications   rivaroxaban (XARELTO) 20 MG TABS tablet     Dizziness       Dizzy and tired, decrease amio 200 and BP meds, if still symptomatic, do cath   Relevant Medications   rivaroxaban (XARELTO) 20 MG TABS tablet          Disposition:   Return in about 3 months (around 03/25/2024).    Total time spent: 30 minutes  Signed,  Adrian Blackwater, MD  12/24/2023 12:07 PM    [facility]

## 2023-12-25 ENCOUNTER — Ambulatory Visit
Admission: RE | Admit: 2023-12-25 | Discharge: 2023-12-25 | Disposition: A | Discharge status: Home / Self Care | Source: Ambulatory Visit | Attending: Student in an Organized Health Care Education/Training Program | Admitting: Student in an Organized Health Care Education/Training Program

## 2023-12-25 ENCOUNTER — Encounter: Payer: Self-pay | Admitting: Student in an Organized Health Care Education/Training Program

## 2023-12-25 ENCOUNTER — Ambulatory Visit: Payer: 59 | Admitting: Student in an Organized Health Care Education/Training Program

## 2023-12-25 VITALS — BP 98/61 | HR 65 | Ht 61.0 in | Wt 235.0 lb

## 2023-12-25 DIAGNOSIS — Z96653 Presence of artificial knee joint, bilateral: Secondary | ICD-10-CM

## 2023-12-25 DIAGNOSIS — M47816 Spondylosis without myelopathy or radiculopathy, lumbar region: Secondary | ICD-10-CM | POA: Insufficient documentation

## 2023-12-25 DIAGNOSIS — M4805 Spinal stenosis, thoracolumbar region: Secondary | ICD-10-CM | POA: Diagnosis not present

## 2023-12-25 DIAGNOSIS — G8929 Other chronic pain: Secondary | ICD-10-CM | POA: Insufficient documentation

## 2023-12-25 DIAGNOSIS — M5416 Radiculopathy, lumbar region: Secondary | ICD-10-CM | POA: Insufficient documentation

## 2023-12-25 DIAGNOSIS — G894 Chronic pain syndrome: Secondary | ICD-10-CM | POA: Insufficient documentation

## 2023-12-25 DIAGNOSIS — M5136 Other intervertebral disc degeneration, lumbar region with discogenic back pain only: Secondary | ICD-10-CM | POA: Diagnosis not present

## 2023-12-25 DIAGNOSIS — M4726 Other spondylosis with radiculopathy, lumbar region: Secondary | ICD-10-CM

## 2023-12-25 NOTE — Progress Notes (Signed)
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 was performed according to the departmental dose-optimization program which includes automated exposure control, adjustment of the mA and/or kV according to patient size and/or use of iterative reconstruction technique.  COMPARISON:  None.  FINDINGS: Bones/Joint/Cartilage  Hip demonstrates a total left hip arthroplasty without failure or complication. No fracture or dislocation. No lytic or blastic lesion.  Knee demonstrates no fracture or dislocation. No lytic or blastic lesion. Severe osteoarthritis of the right medial femorotibial compartment. Mild osteoarthritis of the lateral femorotibial compartment. Moderate osteoarthritis of the patellofemoral compartment. Large joint effusion.  Ankle demonstrates no fracture or dislocation. No lytic  or blastic lesion.  Ligaments  Ligaments are suboptimally evaluated by CT.  Muscles and Tendons Muscles are normal. No muscle atrophy. No intramuscular fluid collection or hematoma.  Soft tissue No fluid collection or hematoma.  No soft tissue mass.  IMPRESSION: 1. Tricompartmental osteoarthritis of the left knee most severe in the medial femorotibial compartment. 2. Total left hip arthroplasty without failure or complication.   Electronically Signed By: Elige Ko M.D. On: 12/30/2021 14:59  Knee-R DG 1-2 views: Results for orders placed during the hospital encounter of 07/12/21  DG Knee 1-2 Views Right  Narrative CLINICAL DATA:  Postop pain.  Status post right knee replacement.  EXAM: RIGHT KNEE - 1-2 VIEW  COMPARISON:  Right knee radiographs 12/17/2020 and CT 05/25/2021  FINDINGS: Sequelae of total knee arthroplasty are identified. The prosthetic components appear normally positioned without evidence of an acute fracture. Postoperative gas is noted in the surrounding soft tissues, and skin staples are in place.  IMPRESSION: Status post right total knee arthroplasty without evidence of acute osseous abnormality.   Electronically Signed By: Sebastian Ache M.D. On: 07/12/2021 10:37  Knee-L DG 1-2 views: Results for orders placed during the hospital encounter of 02/16/22  DG Knee 1-2 Views Left  Narrative CLINICAL DATA:  Status post total left knee arthroplasty.  EXAM: LEFT KNEE - 1-2 VIEW  COMPARISON:  None Available.  FINDINGS: Status post total left knee arthroplasty. No abnormal perihardware lucency is seen to indicate hardware failure or loosening. Expected postoperative changes including anterior soft tissue swelling intra-articular and subcutaneous air anterior surgical skin staples. No acute fracture or dislocation.  IMPRESSION: Status post total left knee arthroplasty without evidence of hardware failure.   Electronically Signed By:  Neita Garnet M.D. On: 02/16/2022 11:42   DG Knee Complete 4 Views Right  Narrative CLINICAL DATA:  Acute left knee pain after fall.  EXAM: RIGHT KNEE - COMPLETE 4+ VIEW  COMPARISON:  None.  FINDINGS: No evidence of fracture, dislocation, or joint effusion. Moderate narrowing of the medial joint space and patellofemoral space is noted with osteophyte formation. Osteophyte formation is noted laterally as well. Soft tissues are unremarkable.  IMPRESSION: Moderate degenerative joint disease. No acute abnormality seen in the right knee.   Electronically Signed By: Lupita Raider M.D. On: 12/17/2020 11:46    Foot Imaging: Foot-R DG Complete: Results for orders placed during the hospital encounter of 05/11/23  DG Foot Complete Right  Narrative CLINICAL DATA:  Right foot pain, no known injury.  EXAM: RIGHT FOOT COMPLETE - 3+ VIEW  COMPARISON:  None Available.  FINDINGS: Hammertoe deformity of the second toe. Otherwise normal alignment. No acute fracture. No erosive change. Midfoot degenerative change with dorsal spurring. Mild first metatarsal-phalangeal joint degenerative change. There is a moderate-sized plantar calcaneal spur. Small fragmented Achilles tendon enthesophyte. Suggestion of mild generalized soft tissue edema.  IMPRESSION: 1. No acute findings. 2. Hammertoe deformity of the second toe. 3. Midfoot and first metatarsal-phalangeal joint degenerative change. 4. Moderate-sized plantar calcaneal spur.   Electronically Signed By: Narda Rutherford M.D. On: 05/11/2023 15:39   Narrative CLINICAL DATA:  Right hand pain  EXAM: RIGHT HAND - COMPLETE 3+ VIEW  COMPARISON:  None Available.  FINDINGS: No fracture or dislocation. Mild flexion at the second and third D IP joints. Mild arthritis at the first [facility] joint. Joint space narrowing and osteophyte at the D IP and PIP joints. No erosions.  IMPRESSION: Mild arthritis as  above.   Electronically Signed By: Jasmine Pang M.D. On: 06/09/2022 23:45  Hand-L DG Complete: No results found for this or any previous visit.   Complexity Note: Imaging results reviewed.                         ROS  Cardiovascular: Abnormal heart rhythm, High blood pressure, and Blood thinners:  Anticoagulant Pulmonary or Respiratory: Snoring  Neurological: stroke Psychological-Psychiatric: Anxiousness, Depressed, and Prone to panicking Gastrointestinal: Reflux or heatburn and Irregular, infrequent bowel movements (Constipation) Genitourinary: No reported renal or genitourinary signs or symptoms such as difficulty voiding or producing urine, peeing blood, non-functioning kidney, kidney stones, difficulty emptying the bladder, difficulty controlling the flow of urine, or chronic kidney disease Hematological: Brusing easily and Bleeding easily Endocrine: No reported endocrine signs or symptoms such as high or low blood sugar, rapid heart rate due to high thyroid levels, obesity or weight gain due to slow thyroid or thyroid disease Rheumatologic: Joint  aches and or swelling due to excess weight (Osteoarthritis) and Generalized muscle aches (Fibromyalgia) Musculoskeletal: Negative for myasthenia gravis, muscular dystrophy, multiple sclerosis or malignant hyperthermia Work History: Disabled  Allergies  Terri Wood is allergic to penicillins, levofloxacin, oxycodone, veozah [fezolinetant], and vilazodone.  Laboratory Chemistry Profile   Renal Lab Results  Component Value Date   BUN 20 10/25/2023   CREATININE 0.93 10/25/2023   BCR 22 10/25/2023   GFRAA >60 08/20/2019   GFRNONAA >60 08/15/2023   SPECGRAV 1.020 11/19/2023   PHUR 7.0 11/19/2023   PROTEINUR Negative 11/19/2023     Electrolytes Lab Results  Component Value Date   NA 148 (H) 10/25/2023   K 4.4 11/19/2023   CL 104 10/25/2023   CALCIUM 9.9 10/25/2023   MG 2.1 06/07/2021   PHOS 4.1 08/20/2019     Hepatic Lab  Results  Component Value Date   AST 23 10/25/2023   ALT 21 10/25/2023   ALBUMIN 4.4 10/25/2023   ALKPHOS 128 (H) 10/25/2023   LIPASE 26 01/16/2021     ID Lab Results  Component Value Date   HIV Non Reactive 01/16/2021   SARSCOV2NAA NEGATIVE 12/12/2022   STAPHAUREUS NEGATIVE 08/15/2023   MRSAPCR NEGATIVE 08/15/2023   PREGTESTUR NEGATIVE 04/09/2018     Bone Lab Results  Component Value Date   VD25OH 44.9 10/25/2023     Endocrine Lab Results  Component Value Date   GLUCOSE 82 10/25/2023   GLUCOSEU Negative 10/25/2023   HGBA1C 5.8 (H) 10/25/2023   TSH 1.340 10/25/2023     Neuropathy Lab Results  Component Value Date   VITAMINB12 495 10/25/2023   HGBA1C 5.8 (H) 10/25/2023   HIV Non Reactive 01/16/2021     CNS No results found for: "COLORCSF", "APPEARCSF", "RBCCOUNTCSF", "WBCCSF", "POLYSCSF", "LYMPHSCSF", "EOSCSF", "PROTEINCSF", "GLUCCSF", "JCVIRUS", "CSFOLI", "IGGCSF", "LABACHR", "ACETBL"   Inflammation (CRP: Acute  ESR: Chronic) Lab Results  Component Value Date   CRP <0.8 08/16/2019   ESRSEDRATE 6 08/16/2019   LATICACIDVEN 1.4 06/06/2021     Rheumatology Lab Results  Component Value Date   ANA Negative 08/16/2019     Coagulation Lab Results  Component Value Date   INR 1.0 06/05/2021   LABPROT 13.5 06/05/2021   APTT 32 06/05/2021   PLT 381 10/25/2023     Cardiovascular Lab Results  Component Value Date   BNP 36.0 01/16/2021   TROPONINI <0.03 03/13/2016   HGB 12.8 10/25/2023   HCT 40.5 10/25/2023     Screening Lab Results  Component Value Date   SARSCOV2NAA NEGATIVE 12/12/2022   STAPHAUREUS NEGATIVE 08/15/2023   MRSAPCR NEGATIVE 08/15/2023   HIV Non Reactive 01/16/2021   PREGTESTUR NEGATIVE 04/09/2018     Cancer No results found for: "CEA", "CA125", "LABCA2"   Allergens No results found for: "ALMOND", "APPLE", "ASPARAGUS", "AVOCADO", "BANANA", "BARLEY", "BASIL", "BAYLEAF", "GREENBEAN", "LIMABEAN", "WHITEBEAN", "BEEFIGE", "REDBEET",  "BLUEBERRY", "BROCCOLI", "CABBAGE", "MELON", "CARROT", "CASEIN", "CASHEWNUT", "CAULIFLOWER", "CELERY"     Note: Lab results reviewed.  PFSH  Drug: Terri Wood  reports that she does not currently use drugs after having used the following drugs: Marijuana. Alcohol:  reports that she does not currently use alcohol. Tobacco:  reports that she quit smoking about 4 years ago. Her smoking use included cigarettes. She has never used smokeless tobacco. Medical:  has a past medical history of Acute ischemic right MCA stroke (HCC) (08/15/2019), Acute respiratory failure (HCC) (01/16/2021), Anxiety, Aortic atherosclerosis (HCC), Atrial fibrillation (HCC), Avascular necrosis of left femoral head (HCC) (04/09/2018), CAD (coronary artery disease) (  03/13/2023), Carotid artery disease (HCC) (08/16/2019), Chronic midline low back pain with right-sided sciatica (02/11/2016), Community acquired pneumonia (01/16/2021), Depression, Essential hypertension, Fibromyalgia, Foraminal stenosis of lumbar region (02/27/2018), GERD (gastroesophageal reflux disease), Long term current use of amiodarone, Lumbar radiculopathy (12/15/2016), Mixed hyperlipidemia, Multifocal pneumonia (06/05/2021), OAB (overactive bladder), On chronic clopidogrel therapy, On rivaroxaban therapy, Osteoarthritis of left hip (2019), Restless leg syndrome (02/02/2015), Rotator cuff tendinitis, right (06/04/2023), Sepsis (HCC) (01/16/2021), T2DM (type 2 diabetes mellitus) (HCC), Tendinitis of upper biceps tendon of right shoulder (06/04/2023), and Traumatic complete tear of right rotator cuff (06/04/2023). Family: family history includes Aneurysm in her mother; Heart disease in her father.  Past Surgical History:  Procedure Laterality Date   ABDOMINAL SURGERY  1996   gastric bypass; stapling; surgilite   GASTRIC BYPASS OPEN  1996   PLANTAR FASCIA SURGERY Right 2009   REVERSE SHOULDER ARTHROPLASTY Right 08/23/2023   Procedure: REVERSE SHOULDER ARTHROPLASTY  WITH BICEPS TENODESIS;  Surgeon: Christena Flake, MD;  Location: [facility];  Service: Orthopedics;  Laterality: Right;   TARSAL TUNNEL RELEASE Right 2009   TOTAL HIP ARTHROPLASTY Left 04/09/2018   Procedure: TOTAL HIP ARTHROPLASTY ANTERIOR APPROACH;  Surgeon: Kennedy Bucker, MD;  Location: [facility];  Service: Orthopedics;  Laterality: Left;   TOTAL KNEE ARTHROPLASTY Right 07/12/2021   Procedure: TOTAL KNEE ARTHROPLASTY;  Surgeon: Kennedy Bucker, MD;  Location: [facility];  Service: Orthopedics;  Laterality: Right;   TOTAL KNEE ARTHROPLASTY Left 02/16/2022   Procedure: TOTAL KNEE ARTHROPLASTY;  Surgeon: Kennedy Bucker, MD;  Location: [facility];  Service: Orthopedics;  Laterality: Left;   Active Ambulatory Problems    Diagnosis Date Noted   Avascular necrosis of left femoral head (HCC) 04/09/2018   Essential hypertension, benign 02/11/2016   Chronic midline low back pain with right-sided sciatica 02/11/2016   Hyperlipidemia 04/27/2021   Current smoker 02/25/2017   CVA, old, hemiparesis (HCC) 01/16/2021   Body mass index 40.0-44.9, adult (HCC) 02/11/2016   Generalized weakness 06/05/2021   S/P TKR (total knee replacement) using cement, right 07/12/2021   History of bilateral knee replacement 02/16/2022   Carotid artery stenosis 08/05/2019   Pain, joint, multiple sites 02/28/2018   Myalgia 11/08/2018   Major depressive disorder, recurrent episode, moderate (HCC) 02/02/2015   Lumbosacral spondylosis without myelopathy 05/23/2022   Foraminal stenosis of lumbar region 12/15/2016   Cerebrovascular disease 08/05/2019   Sciatica 06/25/2015   Restless legs syndrome 02/02/2015   Primary osteoarthritis of right knee 08/07/2017   Perimenopausal symptoms 12/15/2016   Numbness and tingling in both hands 02/11/2016   Fibromyalgia 02/02/2015   Type 2 diabetes mellitus with hyperglycemia, without long-term current use of insulin (HCC) 04/07/2019   Vitamin D deficiency, unspecified 01/08/2023   Palpitations  02/23/2023   Other chest pain 02/23/2023   COPD with acute exacerbation (HCC) 04/25/2023   Status post reverse total shoulder replacement, 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   Thoracic spine pain 10/25/2023   Hypersomnia 12/24/2023   Morbid obesity (HCC) 12/24/2023   Lumbar spondylosis 12/25/2023   Lumbar radiculopathy 12/25/2023   Chronic pain syndrome 12/25/2023   Resolved Ambulatory Problems    Diagnosis Date Noted   Acute ischemic right MCA stroke (HCC) 08/15/2019   Acute respiratory failure (HCC) 01/16/2021   Community acquired pneumonia 01/16/2021   Sepsis (HCC) 01/16/2021   Multifocal pneumonia 06/05/2021   AKI (acute kidney injury) (HCC) 06/05/2021   Cerebrovascular accident (CVA) due to occlusion of right middle cerebral artery (HCC) 05/03/2020   Generalized osteoarthritis of multiple sites 08/07/2015  Tendinitis of upper biceps tendon of right shoulder 06/04/2023   Traumatic complete tear of right rotator cuff 06/04/2023   Rotator cuff tendinitis, right 06/04/2023   Past Medical History:  Diagnosis Date   Anxiety    Aortic atherosclerosis (HCC)    Atrial fibrillation (HCC)    CAD (coronary artery disease) 03/13/2023   Carotid artery disease (HCC) 08/16/2019   Depression    Essential hypertension    GERD (gastroesophageal reflux disease)    Long term current use of amiodarone    Mixed hyperlipidemia    OAB (overactive bladder)    On chronic clopidogrel therapy    On rivaroxaban therapy    Osteoarthritis of left hip 2019   Restless leg syndrome 02/02/2015   T2DM (type 2 diabetes mellitus) (HCC)    Constitutional Exam  General appearance: Well nourished, well developed, and well hydrated. In no apparent acute distress Vitals:   12/25/23 0854  BP: 98/61  Pulse: 65  SpO2: 98%  Weight: 235 lb (106.6 kg)  Height: 5\' 1"  (1.549 m)   BMI Assessment: Estimated body mass index is 44.4 kg/m as calculated from the following:   Height as of this encounter: 5\' 1"  (1.549 m).   Weight  as of this encounter: 235 lb (106.6 kg).  BMI interpretation table: BMI level Category Range association with higher incidence of chronic pain  <18 kg/m2 Underweight   18.5-24.9 kg/m2 Ideal body weight   25-29.9 kg/m2 Overweight Increased incidence by 20%  30-34.9 kg/m2 Obese (Class I) Increased incidence by 68%  35-39.9 kg/m2 Severe obesity (Class II) Increased incidence by 136%  >40 kg/m2 Extreme obesity (Class III) Increased incidence by 254%   Patient's current BMI Ideal Body weight  Body mass index is 44.4 kg/m. Ideal body weight: 47.8 kg (105 lb 6.1 oz) Adjusted ideal body weight: 71.3 kg (157 lb 3.6 oz)   BMI Readings from Last 4 Encounters:  12/25/23 44.40 kg/m  12/24/23 43.46 kg/m  12/24/23 43.46 kg/m  10/25/23 43.46 kg/m   Wt Readings from Last 4 Encounters:  12/25/23 235 lb (106.6 kg)  12/24/23 230 lb (104.3 kg)  12/24/23 230 lb (104.3 kg)  10/25/23 230 lb (104.3 kg)    Psych/Mental status: Alert, oriented x 3 (person, place, & time)       Eyes: PERLA Respiratory: No evidence of acute respiratory distress  Thoracic Spine Area Exam  Skin & Axial Inspection: No masses, redness, or swelling Alignment: Symmetrical Functional ROM: Unrestricted ROM Stability: No instability detected Muscle Tone/Strength: Functionally intact. No obvious neuro-muscular anomalies detected. Sensory (Neurological): Unimpaired Muscle strength & Tone: No palpable anomalies Lumbar Spine Area Exam  Skin & Axial Inspection: No masses, redness, or swelling Alignment: Symmetrical Functional ROM: Pain restricted ROM       Stability: No instability detected Muscle Tone/Strength: Functionally intact. No obvious neuro-muscular anomalies detected. Sensory (Neurological): Musculoskeletal pain pattern Palpation: Complains of area being tender to palpation        Gait & Posture Assessment  Ambulation: Unassisted Gait: Relatively normal for age and body habitus Posture: WNL  Lower Extremity  Exam    Side: Right lower extremity  Side: Left lower extremity  Stability: No instability observed          Stability: No instability observed          Skin & Extremity Inspection: Evidence of prior arthroplastic surgery  Skin & Extremity Inspection: Evidence of prior arthroplastic surgery  Functional ROM: Unrestricted ROM  Functional ROM: Unrestricted ROM                  Muscle Tone/Strength: Functionally intact. No obvious neuro-muscular anomalies detected.  Muscle Tone/Strength: Functionally intact. No obvious neuro-muscular anomalies detected.  Sensory (Neurological): Unimpaired        Sensory (Neurological): Unimpaired        DTR: Patellar: deferred today Achilles: deferred today Plantar: deferred today  DTR: Patellar: deferred today Achilles: deferred today Plantar: deferred today  Palpation: No palpable anomalies  Palpation: No palpable anomalies    Assessment  Primary Diagnosis & Pertinent Problem List: The primary encounter diagnosis was Lumbar facet arthropathy. Diagnoses of Lumbar spondylosis, Chronic radicular lumbar pain, Lumbar radiculopathy, History of bilateral knee replacement, and Chronic pain syndrome were also pertinent to this visit.  Visit Diagnosis (New problems to examiner): 1. Lumbar facet arthropathy   2. Lumbar spondylosis   3. Chronic radicular lumbar pain   4. Lumbar radiculopathy   5. History of bilateral knee replacement   6. Chronic pain syndrome    Plan of Care (Initial workup plan)      Chronic lower back pain   She has experienced chronic lower back pain for over five years with previous ineffective interventions, including spinal injections and nerve ablation. Current symptoms suggest possible sciatic nerve involvement, with pain radiating to the top of the knee and difficulty walking. No recent imaging of the lower back has been performed. Discussed potential use of buprenorphine patch or film for long-acting pain relief, which  has less risk of constipation, nausea, and is less habit-forming compared to hydrocodone. She expressed interest in the patch option. Order updated x-ray of the spine with flexion and extension views. Order MRI of the spine to assess for disc herniations, nerve compression, and extent of arthritis. Consider referral to physical therapy for exercises and stretching for the lower back. Perform urine drug screen. Discuss potential use of buprenorphine patch or film for long-acting pain relief at next visit.  Right shoulder pain post-reverse shoulder replacement   She recently underwent reverse shoulder replacement for a rotator cuff tear with good recovery and cessation of physical therapy due to satisfactory progress.  Bilateral knee osteoarthritis post-replacement   She has had bilateral knee replacements with good outcomes, particularly on the right side. No current knee pain reported.  Type 2 Diabetes Mellitus   Type 2 diabetes is managed with Mounjaro, aiding in weight management by suppressing appetite. She has learned to stop eating when feeling full, contributing to weight management.  Follow-up   A follow-up visit is planned in approximately three weeks to review imaging studies and discuss pain management options.       Lab Orders         Compliance Drug Analysis, Ur     Imaging Orders         DG Lumbar Spine Complete W/Bend         MR LUMBAR SPINE WO CONTRAST     Referral Orders         Ambulatory referral to Physical Therapy       Interventional management options: Terri Wood was informed that there is no guarantee that she would be a candidate for interventional therapies. The decision will be based on the results of diagnostic studies, as well as Terri Wood risk profile.  Procedure(s) under consideration:  Pending results of ordered studies     Provider-requested follow-up: Return in about 4 weeks (around 01/22/2024) for 2nd pt visit- discuss L-xray,  L-MRI, &  [place].  Future Appointments  Date Time Provider Department Center  12/25/2023 10:20 AM [facility]  01/22/2024 12:40 PM Edward Jolly, MD [facility] None  01/23/2024  1:00 PM Miki Kins, FNP [facility] None  03/25/2024  1:00 PM Laurier Nancy, MD [facility] None    Duration of encounter: .  Total time on encounter, as per AMA guidelines included both the face-to-face and non-face-to-face time personally spent by the physician and/or other qualified health care professional(s) on the day of the encounter (includes time in activities that require the physician or other qualified health care professional and does not include time in activities normally performed by clinical staff). Physician's time may include the following activities when performed: Preparing to see the patient (e.g., pre-charting review of records, searching for previously ordered imaging, lab work, and nerve conduction tests) Review of prior analgesic pharmacotherapies. Reviewing PMP Interpreting ordered tests (e.g., lab work, imaging, nerve conduction tests) Performing post-procedure evaluations, including interpretation of diagnostic procedures Obtaining and/or reviewing separately obtained history Performing a medically appropriate examination and/or evaluation Counseling and educating the patient/family/caregiver Ordering medications, tests, or procedures Referring and communicating with other health care professionals (when not separately reported) Documenting clinical information in the electronic or other health record Independently interpreting results (not separately reported) and communicating results to the patient/ family/caregiver Care coordination (not separately reported)  Note by: Edward Jolly, MD (AI and TTS technology used. I apologize for any typographical errors that were not detected and corrected.) Date: 12/25/2023; Time: 10:17 AM

## 2023-12-25 NOTE — Progress Notes (Signed)
 Safety precautions to be maintained throughout the outpatient stay will include: orient to surroundings, keep bed in low position, maintain call bell within reach at all times, provide assistance with transfer out of bed and ambulation.

## 2023-12-25 NOTE — Patient Instructions (Addendum)
 You have been referred for physical therapy.  You will complete MRI and x-rays prior to next appt. At pain clinic.

## 2023-12-28 LAB — COMPLIANCE DRUG ANALYSIS, UR

## 2023-12-31 ENCOUNTER — Ambulatory Visit
Admission: RE | Admit: 2023-12-31 | Discharge: 2023-12-31 | Disposition: A | Discharge status: Home / Self Care | Source: Ambulatory Visit | Attending: Student in an Organized Health Care Education/Training Program | Admitting: Student in an Organized Health Care Education/Training Program

## 2023-12-31 DIAGNOSIS — M47816 Spondylosis without myelopathy or radiculopathy, lumbar region: Secondary | ICD-10-CM | POA: Insufficient documentation

## 2023-12-31 DIAGNOSIS — M5137 Other intervertebral disc degeneration, lumbosacral region with discogenic back pain only: Secondary | ICD-10-CM | POA: Diagnosis not present

## 2023-12-31 DIAGNOSIS — M5416 Radiculopathy, lumbar region: Secondary | ICD-10-CM | POA: Diagnosis not present

## 2023-12-31 DIAGNOSIS — M4807 Spinal stenosis, lumbosacral region: Secondary | ICD-10-CM | POA: Diagnosis not present

## 2023-12-31 DIAGNOSIS — G894 Chronic pain syndrome: Secondary | ICD-10-CM | POA: Diagnosis not present

## 2023-12-31 DIAGNOSIS — M5116 Intervertebral disc disorders with radiculopathy, lumbar region: Secondary | ICD-10-CM | POA: Diagnosis not present

## 2023-12-31 DIAGNOSIS — M48061 Spinal stenosis, lumbar region without neurogenic claudication: Secondary | ICD-10-CM | POA: Diagnosis not present

## 2023-12-31 DIAGNOSIS — G8929 Other chronic pain: Secondary | ICD-10-CM | POA: Diagnosis not present

## 2024-01-02 ENCOUNTER — Telehealth: Payer: Self-pay | Admitting: Student in an Organized Health Care Education/Training Program

## 2024-01-02 ENCOUNTER — Ambulatory Visit: Attending: Student in an Organized Health Care Education/Training Program

## 2024-01-02 NOTE — Telephone Encounter (Signed)
 PT stated that she is in pain, wanted to see if her results are in so he can discuss the next step. PT stated that when she walks it hurts worst. Please give patient a call. TY

## 2024-01-02 NOTE — Telephone Encounter (Signed)
 Attempted to call patient, mailbox full, unable to leave a message.  MRI results not yet posted in the computer.  Patient has appt on 01-19-24.

## 2024-01-09 ENCOUNTER — Ambulatory Visit: Admitting: Family

## 2024-01-11 ENCOUNTER — Other Ambulatory Visit: Payer: Self-pay

## 2024-01-11 ENCOUNTER — Ambulatory Visit

## 2024-01-11 MED FILL — Tirzepatide Soln Auto-injector 15 MG/0.5ML: SUBCUTANEOUS | 28 days supply | Qty: 2 | Fill #5 | Status: AC

## 2024-01-14 ENCOUNTER — Encounter

## 2024-01-14 ENCOUNTER — Ambulatory Visit: Admitting: Family

## 2024-01-17 ENCOUNTER — Encounter

## 2024-01-22 ENCOUNTER — Encounter: Payer: Self-pay | Admitting: Student in an Organized Health Care Education/Training Program

## 2024-01-22 ENCOUNTER — Ambulatory Visit
Attending: Student in an Organized Health Care Education/Training Program | Admitting: Student in an Organized Health Care Education/Training Program

## 2024-01-22 VITALS — BP 114/66 | HR 61 | Temp 97.0°F | Resp 16 | Ht 61.0 in | Wt 229.0 lb

## 2024-01-22 DIAGNOSIS — G8929 Other chronic pain: Secondary | ICD-10-CM | POA: Diagnosis not present

## 2024-01-22 DIAGNOSIS — M47816 Spondylosis without myelopathy or radiculopathy, lumbar region: Secondary | ICD-10-CM | POA: Diagnosis not present

## 2024-01-22 DIAGNOSIS — M5416 Radiculopathy, lumbar region: Secondary | ICD-10-CM | POA: Diagnosis not present

## 2024-01-22 DIAGNOSIS — M4726 Other spondylosis with radiculopathy, lumbar region: Secondary | ICD-10-CM | POA: Diagnosis not present

## 2024-01-22 DIAGNOSIS — G894 Chronic pain syndrome: Secondary | ICD-10-CM | POA: Insufficient documentation

## 2024-01-22 MED ORDER — BUPRENORPHINE 5 MCG/HR TD PTWK: 1.0000 | MEDICATED_PATCH | TRANSDERMAL | 0 refills | Status: AC

## 2024-01-22 MED ORDER — BUPRENORPHINE 7.5 MCG/HR TD PTWK: 1.0000 | MEDICATED_PATCH | TRANSDERMAL | 0 refills | Status: AC

## 2024-01-22 NOTE — Progress Notes (Signed)
 PROVIDER NOTE: Interpretation of information contained herein should be left to medically-trained personnel. Specific patient instructions are provided elsewhere under "Patient Instructions" section of medical record. This document was created in part using AI and STT-dictation technology, any transcriptional errors that may result from this process are unintentional.  Patient: Terri Wood  Service: E/M   PCP: Miki Kins, FNP  DOB: 12/22/1965  DOS: 01/22/2024  Provider: Edward Jolly, MD  MRN: [id number]  Delivery: Face-to-face  Specialty: Interventional Pain Management  Type: Established Patient  Setting: Ambulatory outpatient facility  Specialty designation: 09  Referring Prov.: Miki Kins, FNP  Location: Outpatient office facility       HPI  Terri Wood, a 58 y.o. year old female, is here today because of her Lumbar facet arthropathy [M47.816]. Terri Wood primary complain today is Back Pain  Pertinent problems: Terri Wood has Fibromyalgia; Morbid obesity (HCC); Lumbar facet arthropathy; Lumbar radiculopathy; and Chronic pain syndrome on their pertinent problem list. Pain Assessment: Severity of Chronic pain is reported as a 7 /10. Location: Back Mid, Lower/down side left leg to above left knee and sometimes to left ankle. Onset: More than a month ago. Quality: Constant, Burning, Dull. Timing: Constant. Modifying factor(s): heat, ice. Vitals:  height is 5\' 1"  (1.549 m) and weight is 229 lb (103.9 kg). Her temperature is 97 F (36.1 C) (abnormal). Her blood pressure is 114/66 and her pulse is 61. Her respiration is 16 and oxygen saturation is 99%.  BMI: Estimated body mass index is 43.27 kg/m as calculated from the following:   Height as of this encounter: 5\' 1"  (1.549 m).   Weight as of this encounter: 229 lb (103.9 kg). Last encounter: 12/25/2023. Last procedure: Visit date not found.  Reason for encounter: 2nd pt visit to review xrays and lumbar MRI and discuss  MM  Discussed the use of AI scribe software for clinical note transcription with the patient, who gave verbal consent to proceed.  History of Present Illness   Terri Wood is a 58 year old female with chronic back pain who presents with worsening pain and sciatica.  She has chronic back pain primarily located in the middle to lower back, with recent worsening and radiation down the left leg following the sciatic nerve, sometimes reaching the ankle. This has significantly impacted her ability to walk as desired.  MRI results indicate severe left facet hypertrophy and moderate right facet hypertrophy, along with severe stenosis on the right and moderate stenosis on the left. Despite these findings, she does not experience significant pain radiation down her right leg.  She mentions a previous epidural injection at [facility], which did not provide significant relief. Her current medications include Xarelto, which she takes regularly. She has also tried hydrocodone in the past, but it caused her heart to race, so she prefers to avoid it.  In her social history, she mentions caring for her mother, who has been in and out of the hospital. She describes an incident where she attempted to lift her mother after a fall, which was challenging due to her own back pain.      HPI from initial clinic visit: 12/25/2023 he patient presents with chronic lower back pain.[name] Kitchen She was previously seen at [facility] in [place] for her condition.   She has been experiencing chronic lower back pain for over five years. Previous treatments at [facility] in [place] included spinal injections and nerve ablations, which provided some relief. She has  not undergone physical therapy for her back pain. Currently, she takes hydrocodone, which might numb the pain slightly, but does not provide substantial relief. She also takes gabapentin and Cymbalta for her condition.   She mentions that her sciatic nerve may be acting  up, as she experiences pain shooting down to the top of her knee, which limits her ability to walk for long periods. She has not had an MRI of her lower back, only of her knees, which were replaced. Her knees no longer bother her after the replacements.   She has a history of type 2 diabetes and is on Mounjaro, which helps with her appetite and weight management. She has learned to stop eating when she feels full.   She reports having a reverse shoulder replacement on her right shoulder about six months ago due to a rotator cuff tear, which she feels was successful, as she recently stopped therapy for it.   In terms of her social history, she lives in [place] and mentions that her mother, who is 55 years old, accompanies her to appointments. She occasionally uses a gummy containing CBD or THC to help with sleep. Pharmacotherapy Assessment  Analgesic: Start Butrans patch  Monitoring: [place] PMP: PDMP reviewed during this encounter.       Pharmacotherapy: No side-effects or adverse reactions reported. Compliance: No problems identified. Effectiveness: Clinically acceptable.  Nonah Mattes, RN  01/22/2024 11:08 AM  Sign when Signing Visit Safety precautions to be maintained throughout the outpatient stay will include: orient to surroundings, keep bed in low position, maintain call bell within reach at all times, provide assistance with transfer out of bed and ambulation.   No results found for: "CBDTHCR" No results found for: "D8THCCBX" No results found for: "D9THCCBX"  UDS:  Summary  Date Value Ref Range Status  12/25/2023 FINAL  Final    Comment:    ==================================================================== Compliance Drug Analysis, Ur ==================================================================== Test                             Result       Flag       Units  Drug Present and Declared for Prescription Verification   Hydrocodone                    1527         EXPECTED    ng/mg creat   Norhydrocodone                 2034         EXPECTED   ng/mg creat    Sources of hydrocodone include scheduled prescription medications.    Norhydrocodone is an expected metabolite of hydrocodone.    Gabapentin                     PRESENT      EXPECTED   Bupropion                      PRESENT      EXPECTED   Hydroxybupropion               PRESENT      EXPECTED    Hydroxybupropion is an expected metabolite of bupropion.    Duloxetine                     PRESENT      EXPECTED  Acetaminophen                  PRESENT      EXPECTED   Metoprolol                     PRESENT      EXPECTED  Drug Present not Declared for Prescription Verification   Carboxy-THC                    295          UNEXPECTED ng/mg creat    Carboxy-THC is a metabolite of tetrahydrocannabinol (THC). Source of    THC is most commonly herbal marijuana or marijuana-based products,    but THC is also present in a scheduled prescription medication.    Trace amounts of THC can be present in hemp and cannabidiol (CBD)    products. This test is not intended to distinguish between delta-9-    tetrahydrocannabinol, the predominant form of THC in most herbal or    marijuana-based products, and delta-8-tetrahydrocannabinol.    Dextromethorphan               PRESENT      UNEXPECTED   Dextrorphan/Levorphanol        PRESENT      UNEXPECTED    Dextrorphan is an expected metabolite of dextromethorphan, an over-    the-counter or prescription cough suppressant. Dextrorphan cannot be    distinguished from the scheduled prescription medication levorphanol    by the method used for analysis.  Drug Absent but Declared for Prescription Verification   Clonidine                      Not Detected UNEXPECTED ==================================================================== Test                      Result    Flag   Units      Ref Range   Creatinine              112              mg/dL       >=40 ==================================================================== Declared Medications:  The flagging and interpretation on this report are based on the  following declared medications.  Unexpected results may arise from  inaccuracies in the declared medications.   **Note: The testing scope of this panel includes these medications:   Bupropion (Wellbutrin XL)  Clonidine (Catapres)  Duloxetine (Cymbalta)  Gabapentin (Neurontin)  Hydrocodone (Norco)  Metoprolol (Toprol)   **Note: The testing scope of this panel does not include small to  moderate amounts of these reported medications:   Acetaminophen (Norco)   **Note: The testing scope of this panel does not include the  following reported medications:   Amiodarone (Pacerone)  Atorvastatin (Lipitor)  Azelastine (Astelin)  Biotin  Clopidogrel (Plavix)  Doxycycline (Vibramycin)  Fluoride  Magnesium  Pantoprazole (Protonix)  Polyethylene Glycol (MiraLAX)  Potassium  Probiotic  Rivaroxaban (Xarelto)  Supplement  Tirzepatide (Mounjaro)  Vibegron Leslye Peer)  Vitamin B  Vitamin C  Vitamin D3  Vitamin E  Vortioxetine (Trintellix) ==================================================================== For clinical consultation, please call [phone]. ====================================================================       ROS  Constitutional: Denies any fever or chills Gastrointestinal: No reported hemesis, hematochezia, vomiting, or acute GI distress Musculoskeletal:  Axial low back pain Neurological: No reported episodes of acute onset apraxia, aphasia, dysarthria, agnosia, amnesia, paralysis, loss of coordination, or loss of  consciousness  Medication Review  Ashwagandha, B-complex with vitamin C, Biotin, Black Cohosh, DULoxetine, HYDROcodone-acetaminophen, Lactobacillus-Inulin, Magnesium, Sod Fluoride-Potassium Nitrate, Vibegron, Vitamin D3, Vitamin E, amiodarone, atorvastatin, azelastine, buPROPion,  buprenorphine, cloNIDine, clopidogrel, doxycycline, gabapentin, glucose blood, lisinopril-hydrochlorothiazide, metoprolol succinate, metoprolol tartrate, pantoprazole, polyethylene glycol, rivaroxaban, tirzepatide, vitamin C with rose hips, and vortioxetine HBr  History Review  Allergy: Terri Wood is allergic to penicillins, levofloxacin, oxycodone, veozah [fezolinetant], and vilazodone. Drug: Terri Wood  reports that she does not currently use drugs after having used the following drugs: Marijuana. Alcohol:  reports that she does not currently use alcohol. Tobacco:  reports that she quit smoking about 4 years ago. Her smoking use included cigarettes. She has never used smokeless tobacco. Social: Terri Wood  reports that she quit smoking about 4 years ago. Her smoking use included cigarettes. She has never used smokeless tobacco. She reports that she does not currently use alcohol. She reports that she does not currently use drugs after having used the following drugs: Marijuana. Medical:  has a past medical history of Acute ischemic right MCA stroke (HCC) (08/15/2019), Acute respiratory failure (HCC) (01/16/2021), Anxiety, Aortic atherosclerosis (HCC), Atrial fibrillation (HCC), Avascular necrosis of left femoral head (HCC) (04/09/2018), CAD (coronary artery disease) (03/13/2023), Carotid artery disease (HCC) (08/16/2019), Chronic midline low back pain with right-sided sciatica (02/11/2016), Community acquired pneumonia (01/16/2021), Depression, Essential hypertension, Fibromyalgia, Foraminal stenosis of lumbar region (02/27/2018), GERD (gastroesophageal reflux disease), Long term current use of amiodarone, Lumbar radiculopathy (12/15/2016), Mixed hyperlipidemia, Multifocal pneumonia (06/05/2021), OAB (overactive bladder), On chronic clopidogrel therapy, On rivaroxaban therapy, Osteoarthritis of left hip (2019), Restless leg syndrome (02/02/2015), Rotator cuff tendinitis, right (06/04/2023), Sepsis (HCC)  (01/16/2021), T2DM (type 2 diabetes mellitus) (HCC), Tendinitis of upper biceps tendon of right shoulder (06/04/2023), and Traumatic complete tear of right rotator cuff (06/04/2023). Surgical: Ms. Lobb  has a past surgical history that includes Abdominal surgery (1996); Plantar fascia surgery (Right, 2009); Total hip arthroplasty (Left, 04/09/2018); Total knee arthroplasty (Right, 07/12/2021); Gastric bypass open (1996); Tarsal tunnel release (Right, 2009); Total knee arthroplasty (Left, 02/16/2022); and Reverse shoulder arthroplasty (Right, 08/23/2023). Family: family history includes Aneurysm in her mother; Heart disease in her father.  Laboratory Chemistry Profile   Renal Lab Results  Component Value Date   BUN 20 10/25/2023   CREATININE 0.93 10/25/2023   BCR 22 10/25/2023   GFRAA >60 08/20/2019   GFRNONAA >60 08/15/2023    Hepatic Lab Results  Component Value Date   AST 23 10/25/2023   ALT 21 10/25/2023   ALBUMIN 4.4 10/25/2023   ALKPHOS 128 (H) 10/25/2023   LIPASE 26 01/16/2021    Electrolytes Lab Results  Component Value Date   NA 148 (H) 10/25/2023   K 4.4 11/19/2023   CL 104 10/25/2023   CALCIUM 9.9 10/25/2023   MG 2.1 06/07/2021   PHOS 4.1 08/20/2019    Bone Lab Results  Component Value Date   VD25OH 44.9 10/25/2023    Inflammation (CRP: Acute Phase) (ESR: Chronic Phase) Lab Results  Component Value Date   CRP <0.8 08/16/2019   ESRSEDRATE 6 08/16/2019   LATICACIDVEN 1.4 06/06/2021         Note: Above Lab results reviewed.  Recent Imaging Review  MR LUMBAR SPINE WO CONTRAST CLINICAL DATA:  Low back pain, symptoms persist with > 6 wks treatment. Lumbar radiculopathy, symptoms persist with > 6 wks treatment. Chronic low back pain radiating down the left leg to the knee.  EXAM: MRI LUMBAR SPINE WITHOUT CONTRAST  TECHNIQUE: Multiplanar,  multisequence MR imaging of the lumbar spine was performed. No intravenous contrast was administered.  COMPARISON:   Lumbar spine MRI 04/20/2017  FINDINGS: Segmentation:  Standard.  Alignment:  Mild lumbar dextroscoliosis.  No significant listhesis.  Vertebrae: No fracture or suspicious marrow lesion. Mild Modic type 1 and 2 degenerative endplate changes at L4-5 eccentric to the right.  Conus medullaris and cauda equina: Conus extends to the upper L1 level. Conus and cauda equina appear normal.  Paraspinal and other soft tissues: Unremarkable.  Disc levels:  Disc desiccation throughout the lumbar spine with exception of L5-S1. Asymmetrically advanced disc space narrowing on the left at L2-3 and on the right at L4-5.  T12-L1: Mild facet and ligamentum flavum hypertrophy without disc herniation or stenosis, unchanged.  L1-2: Circumferential disc bulging mildly eccentric to the right and mild to moderate facet and ligamentum flavum hypertrophy result in mild left lateral recess stenosis and mild bilateral neural foraminal stenosis, slightly progressed. No spinal stenosis.  L2-3: Left eccentric disc bulging, endplate spurring, disc space height loss, and moderate facet and ligamentum flavum hypertrophy result in mild spinal stenosis, mild left lateral recess stenosis, and moderate left neural foraminal stenosis, mildly progressed.  L3-4: Disc bulging and moderate right and severe left facet and ligamentum flavum hypertrophy result in mild spinal stenosis and mild left greater than right neural foraminal stenosis, mildly progressed.  L4-5: Right eccentric disc bulging, endplate spurring, disc space height loss, and moderate right and mild left facet and ligamentum flavum hypertrophy result in severe right neural foraminal stenosis without spinal stenosis, unchanged.  L5-S1: Minimal rightward disc bulging, endplate spurring, and moderate to severe right and mild left facet hypertrophy result in mild-to-moderate right neural foraminal stenosis, stable to slightly progressed. No spinal  stenosis.  IMPRESSION: 1. Mild progression of multilevel lumbar disc and facet degeneration since 2018. 2. Mild spinal stenosis and moderate left neural foraminal stenosis at L2-3. 3. Mild spinal stenosis and mild bilateral neural foraminal stenosis at L3-4. 4. Severe right neural foraminal stenosis at L4-5.  Electronically Signed   By: Sebastian Ache M.D.   On: 01/21/2024 10:29 Note: Reviewed        Physical Exam  General appearance: Well nourished, well developed, and well hydrated. In no apparent acute distress Mental status: Alert, oriented x 3 (person, place, & time)       Respiratory: No evidence of acute respiratory distress Eyes: PERLA Vitals: BP 114/66   Pulse 61   Temp (!) 97 F (36.1 C)   Resp 16   Ht 5\' 1"  (1.549 m)   Wt 229 lb (103.9 kg)   LMP 04/01/2018 (Exact Date)   SpO2 99%   BMI 43.27 kg/m  BMI: Estimated body mass index is 43.27 kg/m as calculated from the following:   Height as of this encounter: 5\' 1"  (1.549 m).   Weight as of this encounter: 229 lb (103.9 kg). Ideal: Ideal body weight: 47.8 kg (105 lb 6.1 oz) Adjusted ideal body weight: 70.2 kg (154 lb 13.3 oz)  Lumbar Spine Area Exam  Skin & Axial Inspection: No masses, redness, or swelling Alignment: Symmetrical Functional ROM: Pain restricted ROM       Stability: No instability detected Muscle Tone/Strength: Functionally intact. No obvious neuro-muscular anomalies detected. Sensory (Neurological): Musculoskeletal pain pattern, facet mediated Palpation: Complains of area being tender to palpation     Pain with lumbar extension and facet loading   Gait & Posture Assessment  Ambulation: Unassisted Gait: Relatively normal for age and  body habitus Posture: WNL  Lower Extremity Exam      Side: Right lower extremity   Side: Left lower extremity  Stability: No instability observed           Stability: No instability observed          Skin & Extremity Inspection: Evidence of prior arthroplastic  surgery   Skin & Extremity Inspection: Evidence of prior arthroplastic surgery  Functional ROM: Unrestricted ROM                   Functional ROM: Unrestricted ROM                  Muscle Tone/Strength: Functionally intact. No obvious neuro-muscular anomalies detected.   Muscle Tone/Strength: Functionally intact. No obvious neuro-muscular anomalies detected.  Sensory (Neurological): Unimpaired         Sensory (Neurological): Unimpaired        DTR: Patellar: deferred today Achilles: deferred today Plantar: deferred today   DTR: Patellar: deferred today Achilles: deferred today Plantar: deferred today  Palpation: No palpable anomalies   Palpation: No palpable anomalies     Assessment   Diagnosis Status  1. Lumbar facet arthropathy   2. Lumbar spondylosis   3. Chronic radicular lumbar pain   4. Lumbar radiculopathy   5. Chronic pain syndrome    Controlled Controlled Controlled   Updated Problems: Problem  Lumbar Facet Arthropathy    Plan of Care  Assessment and Plan    Facet joint arthritis with lumbar stenosis   She experiences severe low back and upper buttock pain radiating down the left leg to the ankle, indicating sciatic nerve involvement. MRI shows severe left facet hypertrophy, moderate right and mild left facet hypertrophy, and severe right and moderate left stenosis. The primary concern is low back pain due to facet joint arthritis and lumbar stenosis. Diagnostic nerve blocks at L3, L4, and L5 will assess pain relief to determine suitability for radiofrequency ablation, which can provide 6-12 months of relief.   Terri Wood has a history of greater than 3 months of moderate to severe pain which is resulted in functional impairment.  The patient has tried various conservative therapeutic options such as NSAIDs, Tylenol, muscle relaxants, physical therapy which was inadequately effective.  Patient's pain is predominantly axial with physical exam and L-MRI  findings  suggestive of facet arthropathy. Lumbar facet medial branch nerve blocks were discussed with the patient.  Risks and benefits were reviewed.  Patient would like to proceed with bilateral L2, L3, L4, L5 medial branch nerve block. She should stop Xarelto three days prior to the procedure and take baby aspirin instead.  Radiating leg pain   Radiating pain down the left leg is potentially due to moderate left stenosis. Focus on managing low back pain first with nerve blocks and buprenorphine patches.  Medication management   She is currently on Xarelto and reports adverse effects from hydrocodone, such as increased heart rate. Prefers buprenorphine patches over oral opioids. Buprenorphine patches provide continuous pain relief and are safer than morphine, hydrocodone, and oxycodone. Start with a low dose and increase gradually to manage pain effectively while minimizing side effects. She should stop Xarelto three days before the nerve block procedure and take baby aspirin instead. Prescribe buprenorphine patches as an alternative to oral opioids.  Follow-up   She is scheduled for nerve block procedures towards the end of the month, pending insurance approval. Ensure a driver is available for the procedure.  Terri Wood has a current medication list which includes the following long-term medication(s): amiodarone, atorvastatin, azelastine, bupropion, duloxetine, gabapentin, lisinopril-hydrochlorothiazide, metoprolol succinate, metoprolol tartrate, pantoprazole, rivaroxaban, and clonidine.  Pharmacotherapy (Medications Ordered): Meds ordered this encounter  Medications   buprenorphine (BUTRANS) 5 MCG/HR PTWK    Sig: Place 1 patch onto the skin once a week for 28 days.    Dispense:  4 patch    Refill:  0    Chronic Pain: STOP Act (Not applicable) Fill 1 day early if closed on refill date. Avoid benzodiazepines within 8 hours of opioids   buprenorphine (BUTRANS) 7.5 MCG/HR    Sig:  Place 1 patch onto the skin once a week for 28 days.    Dispense:  4 patch    Refill:  0    Chronic Pain: STOP Act (Not applicable) Fill 1 day early if closed on refill date. Avoid benzodiazepines within 8 hours of opioids   Orders:  Orders Placed This Encounter  Procedures   LUMBAR FACET(MEDIAL BRANCH NERVE BLOCK) MBNB    Bilateral L2, L3,4,5 MBNB PO Valium 10 mg Stop Xarelto 3 days prior, ok to take ASA 81 mg  Diagnosis: Lumbar Facet Syndrome (M47.816); Lumbosacral Facet Syndrome (M47.817); Lumbar Facet Joint Pain (M54.59) Medical Necessity Statement: 1.Severe chronic axial low back pain causing functional impairment documented by ongoing pain scale assessments. 2.Pain present for longer than 3 months (Chronic) documented to have failed noninvasive conservative therapies. 3.Absence of untreated radiculopathy. 4.There is no radiological evidence of untreated fractures, tumor, infection, or deformity.  Physical Examination Findings: Positive Kemp Maneuver: (Y)  Positive Lumbar Hyperextension-Rotation provocative test: (Y)    Standing Status:   Future    Expected Date:   02/13/2024    Expiration Date:   04/22/2024    Scheduling Instructions:     Procedure: Lumbar facet Block     Type: Medial Branch Block     Side: Bilateral     Purpose: Diagnostic Radiologic Mapping     Level(s): L3-4, L4-5, by Fluoroscopic Mapping Facets ( L3, L4, L5, Medial Branch)     Sedation: Patient's choice.     Timeframe: As soon as schedule allows.    Where will this procedure be performed?:   [facility]   Follow-up plan:   Return in about 22 days (around 02/13/2024) for B/L L2,3,4,5 MBNB, Po Valium 10 (stop Xarelto 3 days prior, take ASA 81 instead).    Recent Visits Date Type Provider Dept  12/25/23 Office Visit Edward Jolly, MD [facility]  Showing recent visits within past 90 days and meeting all other requirements Today's Visits Date Type Provider Dept  01/22/24 Office Visit  Edward Jolly, MD [facility]  Showing today's visits and meeting all other requirements Future Appointments No visits were found meeting these conditions. Showing future appointments within next 90 days and meeting all other requirements  I discussed the assessment and treatment plan with the patient. The patient was provided an opportunity to ask questions and all were answered. The patient agreed with the plan and demonstrated an understanding of the instructions.  Patient advised to call back or seek an in-person evaluation if the symptoms or condition worsens.  Duration of encounter  30 minutes.  Total time on encounter, as per AMA guidelines included both the face-to-face and non-face-to-face time personally spent by the physician and/or other qualified health care professional(s) on the day of the encounter (includes time in activities that require the physician or other  qualified health care professional and does not include time in activities normally performed by clinical staff). Physician's time may include the following activities when performed: Preparing to see the patient (e.g., pre-charting review of records, searching for previously ordered imaging, lab work, and nerve conduction tests) Review of prior analgesic pharmacotherapies. Reviewing PMP Interpreting ordered tests (e.g., lab work, imaging, nerve conduction tests) Performing post-procedure evaluations, including interpretation of diagnostic procedures Obtaining and/or reviewing separately obtained history Performing a medically appropriate examination and/or evaluation Counseling and educating the patient/family/caregiver Ordering medications, tests, or procedures Referring and communicating with other health care professionals (when not separately reported) Documenting clinical information in the electronic or other health record Independently interpreting results (not separately reported) and communicating  results to the patient/ family/caregiver Care coordination (not separately reported)  Note by: Edward Jolly, MD (TTS and AI technology used. I apologize for any typographical errors that were not detected and corrected.) Date: 01/22/2024; Time: 1:30 PM

## 2024-01-22 NOTE — Patient Instructions (Signed)
 ______________________________________________________________________    General Risks and Possible Complications  Patient Responsibilities: It is important that you read this as it is part of your informed consent. It is our duty to inform you of the risks and possible complications associated with treatments offered to you. It is your responsibility as a patient to read this and to ask questions about anything that is not clear or that you believe was not covered in this document.  Patient's Rights: You have the right to refuse treatment. You also have the right to change your mind, even after initially having agreed to have the treatment done. However, under this last option, if you wait until the last second to change your mind, you may be charged for the materials used up to that point.  Introduction: Medicine is not an Visual merchandiser. Everything in Medicine, including the lack of treatment(s), carries the potential for danger, harm, or loss (which is by definition: Risk). In Medicine, a complication is a secondary problem, condition, or disease that can aggravate an already existing one. All treatments carry the risk of possible complications. The fact that a side effects or complications occurs, does not imply that the treatment was conducted incorrectly. It must be clearly understood that these can happen even when everything is done following the highest safety standards.  No treatment: You can choose not to proceed with the proposed treatment alternative. The "PRO(s)" would include: avoiding the risk of complications associated with the therapy. The "CON(s)" would include: not getting any of the treatment benefits. These benefits fall under one of three categories: diagnostic; therapeutic; and/or palliative. Diagnostic benefits include: getting information which can ultimately lead to improvement of the disease or symptom(s). Therapeutic benefits are those associated with the successful  treatment of the disease. Finally, palliative benefits are those related to the decrease of the primary symptoms, without necessarily curing the condition (example: decreasing the pain from a flare-up of a chronic condition, such as incurable terminal cancer).  General Risks and Complications: These are associated to most interventional treatments. They can occur alone, or in combination. They fall under one of the following six (6) categories: no benefit or worsening of symptoms; bleeding; infection; nerve damage; allergic reactions; and/or death. No benefits or worsening of symptoms: In Medicine there are no guarantees, only probabilities. No healthcare provider can ever guarantee that a medical treatment will work, they can only state the probability that it may. Furthermore, there is always the possibility that the condition may worsen, either directly, or indirectly, as a consequence of the treatment. Bleeding: This is more common if the patient is taking a blood thinner, either prescription or over the counter (example: Goody Powders, Fish oil, Aspirin, Garlic, etc.), or if suffering a condition associated with impaired coagulation (example: Hemophilia, cirrhosis of the liver, low platelet counts, etc.). However, even if you do not have one on these, it can still happen. If you have any of these conditions, or take one of these drugs, make sure to notify your treating physician. Infection: This is more common in patients with a compromised immune system, either due to disease (example: diabetes, cancer, human immunodeficiency virus [HIV], etc.), or due to medications or treatments (example: therapies used to treat cancer and rheumatological diseases). However, even if you do not have one on these, it can still happen. If you have any of these conditions, or take one of these drugs, make sure to notify your treating physician. Nerve Damage: This is more common when the treatment is  an invasive one, but it  can also happen with the use of medications, such as those used in the treatment of cancer. The damage can occur to small secondary nerves, or to large primary ones, such as those in the spinal cord and brain. This damage may be temporary or permanent and it may lead to impairments that can range from temporary numbness to permanent paralysis and/or brain death. Allergic Reactions: Any time a substance or material comes in contact with our body, there is the possibility of an allergic reaction. These can range from a mild skin rash (contact dermatitis) to a severe systemic reaction (anaphylactic reaction), which can result in death. Death: In general, any medical intervention can result in death, most of the time due to an unforeseen complication. ______________________________________________________________________      ______________________________________________________________________    Preparing for your procedure  Appointments: If you think you may not be able to keep your appointment, call 24-48 hours in advance to cancel. We need time to make it available to others.  Procedure visits are for procedures only. During your procedure appointment there will be: NO Prescription Refills*. NO medication changes or discussions*. NO discussion of disability issues*. NO unrelated pain problem evaluations*. NO evaluations to order other pain procedures*. *These will be addressed at a separate and distinct evaluation encounter on the provider's evaluation schedule and not during procedure days.  Instructions: Food intake: Avoid eating anything solid for at least 8 hours prior to your procedure. Clear liquid intake: You may take clear liquids such as water up to 2 hours prior to your procedure. (No carbonated drinks. No soda.) Transportation: Unless otherwise stated by your physician, bring a driver. (Driver cannot be a Market researcher, Pharmacist, community, or any other form of public transportation.) Morning  Medicines: Except for blood thinners, take all of your other morning medications with a sip of water. Make sure to take your heart and blood pressure medicines. If your blood pressure's lower number is above 100, the case will be rescheduled. Blood thinners: Make sure to stop your blood thinners as instructed.  If you take a blood thinner, but were not instructed to stop it, call our office [phone] and ask to talk to a nurse. Not stopping a blood thinner prior to certain procedures could lead to serious complications. Diabetics on insulin: Notify the staff so that you can be scheduled 1st case in the morning. If your diabetes requires high dose insulin, take only  of your normal insulin dose the morning of the procedure and notify the staff that you have done so. Preventing infections: Shower with an antibacterial soap the morning of your procedure.  Build-up your immune system: Take 1000 mg of Vitamin C with every meal (3 times a day) the day prior to your procedure. Antibiotics: Inform the nursing staff if you are taking any antibiotics or if you have any conditions that may require antibiotics prior to procedures. (Example: recent joint implants)   Pregnancy: If you are pregnant make sure to notify the nursing staff. Not doing so may result in injury to the fetus, including death.  Sickness: If you have a cold, fever, or any active infections, call and cancel or reschedule your procedure. Receiving steroids while having an infection may result in complications. Arrival: You must be in the facility at least 30 minutes prior to your scheduled procedure. Tardiness: Your scheduled time is also the cutoff time. If you do not arrive at least 15 minutes prior to your procedure, you will  be rescheduled.  Children: Do not bring any children with you. Make arrangements to keep them home. Dress appropriately: There is always a possibility that your clothing may get soiled. Avoid long dresses. Valuables:  Do not bring any jewelry or valuables.  Reasons to call and reschedule or cancel your procedure: (Following these recommendations will minimize the risk of a serious complication.) Surgeries: Avoid having procedures within 2 weeks of any surgery. (Avoid for 2 weeks before or after any surgery). Flu Shots: Avoid having procedures within 2 weeks of a flu shots or . (Avoid for 2 weeks before or after immunizations). Barium: Avoid having a procedure within 7-10 days after having had a radiological study involving the use of radiological contrast. (Myelograms, Barium swallow or enema study). Heart attacks: Avoid any elective procedures or surgeries for the initial 6 months after a "Myocardial Infarction" (Heart Attack). Blood thinners: It is imperative that you stop these medications before procedures. Let us know if you if you take any blood thinner.  Infection: Avoid procedures during or within two weeks of an infection (including chest colds or gastrointestinal problems). Symptoms associated with infections include: Localized redness, fever, chills, night sweats or profuse sweating, burning sensation when voiding, cough, congestion, stuffiness, runny nose, sore throat, diarrhea, nausea, vomiting, cold or Flu symptoms, recent or current infections. It is specially important if the infection is over the area that we intend to treat. Heart and lung problems: Symptoms that may suggest an active cardiopulmonary problem include: cough, chest pain, breathing difficulties or shortness of breath, dizziness, ankle swelling, uncontrolled high or unusually low blood pressure, and/or palpitations. If you are experiencing any of these symptoms, cancel your procedure and contact your primary care physician for an evaluation.  Remember:  Regular Business hours are:  Monday to Thursday 8:00 AM to 4:00 PM  Provider's Schedule: Delano Metz, MD:  Procedure days: Tuesday and Thursday 7:30 AM to 4:00 PM  Edward Jolly, MD:  Procedure days: Monday and Wednesday 7:30 AM to 4:00 PM Last  Updated: 09/25/2023 ______________________________________________________________________     Facet Blocks Patient Information  Description: The facets are joints in the spine between the vertebrae.  Like any joints in the body, facets can become irritated and painful.  Arthritis can also effect the facets.  By injecting steroids and local anesthetic in and around these joints, we can temporarily block the nerve supply to them.  Steroids act directly on irritated nerves and tissues to reduce selling and inflammation which often leads to decreased pain.  Facet blocks may be done anywhere along the spine from the neck to the low back depending upon the location of your pain.   After numbing the skin with local anesthetic (like Novocaine), a small needle is passed onto the facet joints under x-ray guidance.  You may experience a sensation of pressure while this is being done.  The entire block usually lasts about 15-25 minutes.   Conditions which may be treated by facet blocks:  Low back/buttock pain Neck/shoulder pain Certain types of headaches  Preparation for the injection:  Do not eat any solid food or dairy products within 8 hours of your appointment. You may drink clear liquid up to 3 hours before appointment.  Clear liquids include water, black coffee, juice or soda.  No milk or cream please. You may take your regular medication, including pain medications, with a sip of water before your appointment.  Diabetics should hold regular insulin (if taken separately) and take 1/2 normal NPH dose  the morning of the procedure.  Carry some sugar containing items with you to your appointment. A driver must accompany you and be prepared to drive you home after your procedure. Bring all your current medications with you. An IV may be inserted and sedation may be given at the discretion of the physician. A blood pressure  cuff, EKG and other monitors will often be applied during the procedure.  Some patients may need to have extra oxygen administered for a short period. You will be asked to provide medical information, including your allergies and medications, prior to the procedure.  We must know immediately if you are taking blood thinners (like Coumadin/Warfarin) or if you are allergic to IV iodine contrast (dye).  We must know if you could possible be pregnant.  Possible side-effects:  Bleeding from needle site Infection (rare, may require surgery) Nerve injury (rare) Numbness & tingling (temporary) Difficulty urinating (rare, temporary) Spinal headache (a headache worse with upright posture) Light-headedness (temporary) Pain at injection site (serveral days) Decreased blood pressure (rare, temporary) Weakness in arm/leg (temporary) Pressure sensation in back/neck (temporary)   Call if you experience:  Fever/chills associated with headache or increased back/neck pain Headache worsened by an upright position New onset, weakness or numbness of an extremity below the injection site Hives or difficulty breathing (go to the emergency room) Inflammation or drainage at the injection site(s) Severe back/neck pain greater than usual New symptoms which are concerning to you  Please note:  Although the local anesthetic injected can often make your back or neck feel good for several hours after the injection, the pain will likely return. It takes 3-7 days for steroids to work.  You may not notice any pain relief for at least one week.  If effective, we will often do a series of 2-3 injections spaced 3-6 weeks apart to maximally decrease your pain.  After the initial series, you may be a candidate for a more permanent nerve block of the facets.  If you have any questions, please call [phone] [facility]

## 2024-01-22 NOTE — Progress Notes (Signed)
 Safety precautions to be maintained throughout the outpatient stay will include: orient to surroundings, keep bed in low position, maintain call bell within reach at all times, provide assistance with transfer out of bed and ambulation.

## 2024-01-23 ENCOUNTER — Ambulatory Visit: Payer: 59 | Admitting: Family

## 2024-01-23 ENCOUNTER — Encounter: Payer: Self-pay | Admitting: Family

## 2024-01-23 VITALS — BP 106/60 | HR 51 | Ht 61.0 in | Wt 230.0 lb

## 2024-01-23 DIAGNOSIS — Z6841 Body Mass Index (BMI) 40.0 and over, adult: Secondary | ICD-10-CM

## 2024-01-23 DIAGNOSIS — L659 Nonscarring hair loss, unspecified: Secondary | ICD-10-CM

## 2024-01-23 DIAGNOSIS — E782 Mixed hyperlipidemia: Secondary | ICD-10-CM

## 2024-01-23 DIAGNOSIS — J449 Chronic obstructive pulmonary disease, unspecified: Secondary | ICD-10-CM | POA: Diagnosis not present

## 2024-01-23 DIAGNOSIS — R5383 Other fatigue: Secondary | ICD-10-CM

## 2024-01-23 DIAGNOSIS — G4733 Obstructive sleep apnea (adult) (pediatric): Secondary | ICD-10-CM | POA: Diagnosis not present

## 2024-01-23 DIAGNOSIS — I69359 Hemiplegia and hemiparesis following cerebral infarction affecting unspecified side: Secondary | ICD-10-CM | POA: Diagnosis not present

## 2024-01-23 DIAGNOSIS — E559 Vitamin D deficiency, unspecified: Secondary | ICD-10-CM | POA: Diagnosis not present

## 2024-01-23 DIAGNOSIS — F331 Major depressive disorder, recurrent, moderate: Secondary | ICD-10-CM

## 2024-01-23 DIAGNOSIS — E538 Deficiency of other specified B group vitamins: Secondary | ICD-10-CM

## 2024-01-23 DIAGNOSIS — E1165 Type 2 diabetes mellitus with hyperglycemia: Secondary | ICD-10-CM

## 2024-01-23 DIAGNOSIS — I10 Essential (primary) hypertension: Secondary | ICD-10-CM

## 2024-01-23 MED ORDER — VORTIOXETINE HBR 5 MG PO TABS: 5.0000 mg | ORAL_TABLET | Freq: Every morning | ORAL | 3 refills | Status: DC

## 2024-01-23 MED ORDER — TRAZODONE HCL 50 MG PO TABS: 50.0000 mg | ORAL_TABLET | Freq: Every day | ORAL | 1 refills | Status: DC

## 2024-01-24 LAB — CBC WITH DIFFERENTIAL/PLATELET
Basophils Absolute: 0.1 10*3/uL (ref 0.0–0.2)
Basos: 1 %
EOS (ABSOLUTE): 0.1 10*3/uL (ref 0.0–0.4)
Eos: 1 %
Hematocrit: 40.2 % (ref 34.0–46.6)
Hemoglobin: 13.5 g/dL (ref 11.1–15.9)
Immature Grans (Abs): 0 10*3/uL (ref 0.0–0.1)
Immature Granulocytes: 0 %
Lymphocytes Absolute: 2.4 10*3/uL (ref 0.7–3.1)
Lymphs: 36 %
MCH: 30.3 pg (ref 26.6–33.0)
MCHC: 33.6 g/dL (ref 31.5–35.7)
MCV: 90 fL (ref 79–97)
Monocytes Absolute: 0.7 10*3/uL (ref 0.1–0.9)
Monocytes: 11 %
Neutrophils Absolute: 3.5 10*3/uL (ref 1.4–7.0)
Neutrophils: 51 %
Platelets: 322 10*3/uL (ref 150–450)
RBC: 4.46 x10E6/uL (ref 3.77–5.28)
RDW: 13.3 % (ref 11.7–15.4)
WBC: 6.8 10*3/uL (ref 3.4–10.8)

## 2024-01-24 LAB — IRON,TIBC AND FERRITIN PANEL
Ferritin: 36 ng/mL (ref 15–150)
Iron Saturation: 27 % (ref 15–55)
Iron: 78 ug/dL (ref 27–159)
Total Iron Binding Capacity: 290 ug/dL (ref 250–450)
UIBC: 212 ug/dL (ref 131–425)

## 2024-01-24 LAB — TSH+T4F+T3FREE
Free T4: 1.03 ng/dL (ref 0.82–1.77)
T3, Free: 2.5 pg/mL (ref 2.0–4.4)
TSH: 0.984 u[IU]/mL (ref 0.450–4.500)

## 2024-01-24 LAB — LIPID PANEL
Chol/HDL Ratio: 2.3 ratio (ref 0.0–4.4)
Cholesterol, Total: 116 mg/dL (ref 100–199)
HDL: 51 mg/dL (ref >39)
LDL Chol Calc (NIH): 44 mg/dL (ref 0–99)
Triglycerides: 116 mg/dL (ref 0–149)
VLDL Cholesterol Cal: 21 mg/dL (ref 5–40)

## 2024-01-24 LAB — CMP14+EGFR
ALT: 21 IU/L (ref 0–32)
AST: 22 IU/L (ref 0–40)
Albumin: 4.2 g/dL (ref 3.8–4.9)
Alkaline Phosphatase: 122 IU/L — ABNORMAL HIGH (ref 44–121)
BUN/Creatinine Ratio: 18 (ref 9–23)
BUN: 17 mg/dL (ref 6–24)
Bilirubin Total: 0.3 mg/dL (ref 0.0–1.2)
CO2: 22 mmol/L (ref 20–29)
Calcium: 9.5 mg/dL (ref 8.7–10.2)
Chloride: 104 mmol/L (ref 96–106)
Creatinine, Ser: 0.94 mg/dL (ref 0.57–1.00)
Globulin, Total: 2.4 g/dL (ref 1.5–4.5)
Glucose: 89 mg/dL (ref 70–99)
Potassium: 4.4 mmol/L (ref 3.5–5.2)
Sodium: 142 mmol/L (ref 134–144)
Total Protein: 6.6 g/dL (ref 6.0–8.5)
eGFR: 71 mL/min/{1.73_m2} (ref >59)

## 2024-01-24 LAB — HEMOGLOBIN A1C
Est. average glucose Bld gHb Est-mCnc: 123 mg/dL
Hgb A1c MFr Bld: 5.9 % — ABNORMAL HIGH (ref 4.8–5.6)

## 2024-01-24 LAB — VITAMIN B12: Vitamin B-12: 472 pg/mL (ref 232–1245)

## 2024-01-24 LAB — VITAMIN D 25 HYDROXY (VIT D DEFICIENCY, FRACTURES): Vit D, 25-Hydroxy: 45.2 ng/mL (ref 30.0–100.0)

## 2024-01-28 ENCOUNTER — Telehealth: Payer: Self-pay | Admitting: Family

## 2024-01-28 NOTE — Telephone Encounter (Signed)
 Patient called in wanting her lab results from 4/9. Please advise.

## 2024-01-31 ENCOUNTER — Emergency Department
Admission: EM | Admit: 2024-01-31 | Discharge: 2024-02-01 | Disposition: A | Discharge status: Home / Self Care | Attending: Emergency Medicine | Admitting: Emergency Medicine

## 2024-01-31 ENCOUNTER — Emergency Department

## 2024-01-31 ENCOUNTER — Other Ambulatory Visit: Payer: Self-pay

## 2024-01-31 DIAGNOSIS — Z23 Encounter for immunization: Secondary | ICD-10-CM | POA: Insufficient documentation

## 2024-01-31 DIAGNOSIS — G9389 Other specified disorders of brain: Secondary | ICD-10-CM | POA: Diagnosis not present

## 2024-01-31 DIAGNOSIS — Z7901 Long term (current) use of anticoagulants: Secondary | ICD-10-CM | POA: Diagnosis not present

## 2024-01-31 DIAGNOSIS — W1830XA Fall on same level, unspecified, initial encounter: Secondary | ICD-10-CM | POA: Insufficient documentation

## 2024-01-31 DIAGNOSIS — S199XXA Unspecified injury of neck, initial encounter: Secondary | ICD-10-CM | POA: Diagnosis not present

## 2024-01-31 DIAGNOSIS — R55 Syncope and collapse: Secondary | ICD-10-CM | POA: Diagnosis not present

## 2024-01-31 DIAGNOSIS — R519 Headache, unspecified: Secondary | ICD-10-CM | POA: Diagnosis not present

## 2024-01-31 DIAGNOSIS — S59911A Unspecified injury of right forearm, initial encounter: Secondary | ICD-10-CM | POA: Diagnosis present

## 2024-01-31 DIAGNOSIS — R197 Diarrhea, unspecified: Secondary | ICD-10-CM | POA: Diagnosis not present

## 2024-01-31 DIAGNOSIS — S40811A Abrasion of right upper arm, initial encounter: Secondary | ICD-10-CM | POA: Diagnosis not present

## 2024-01-31 DIAGNOSIS — N179 Acute kidney failure, unspecified: Secondary | ICD-10-CM | POA: Diagnosis not present

## 2024-01-31 LAB — BASIC METABOLIC PANEL WITH GFR
Anion gap: 9 (ref 5–15)
BUN: 21 mg/dL — ABNORMAL HIGH (ref 6–20)
CO2: 28 mmol/L (ref 22–32)
Calcium: 9.2 mg/dL (ref 8.9–10.3)
Chloride: 99 mmol/L (ref 98–111)
Creatinine, Ser: 1.82 mg/dL — ABNORMAL HIGH (ref 0.44–1.00)
GFR, Estimated: 32 mL/min — ABNORMAL LOW (ref >60)
Glucose, Bld: 100 mg/dL — ABNORMAL HIGH (ref 70–99)
Potassium: 3.8 mmol/L (ref 3.5–5.1)
Sodium: 136 mmol/L (ref 135–145)

## 2024-01-31 LAB — CBC
HCT: 41.2 % (ref 36.0–46.0)
Hemoglobin: 13.2 g/dL (ref 12.0–15.0)
MCH: 30 pg (ref 26.0–34.0)
MCHC: 32 g/dL (ref 30.0–36.0)
MCV: 93.6 fL (ref 80.0–100.0)
Platelets: 305 10*3/uL (ref 150–400)
RBC: 4.4 MIL/uL (ref 3.87–5.11)
RDW: 14.5 % (ref 11.5–15.5)
WBC: 8.6 10*3/uL (ref 4.0–10.5)
nRBC: 0 % (ref 0.0–0.2)

## 2024-01-31 LAB — CBG MONITORING, ED: Glucose-Capillary: 73 mg/dL (ref 70–99)

## 2024-01-31 MED ORDER — LACTATED RINGERS IV BOLUS
1000.0000 mL | Freq: Once | INTRAVENOUS | Status: AC
  Administered 2024-01-31: 1000 mL via INTRAVENOUS

## 2024-01-31 MED ORDER — LACTATED RINGERS IV BOLUS
1000.0000 mL | Freq: Once | INTRAVENOUS | Status: AC
  Administered 2024-02-01: 1000 mL via INTRAVENOUS

## 2024-01-31 MED ORDER — TETANUS-DIPHTH-ACELL PERTUSSIS 5-2.5-18.5 LF-MCG/0.5 IM SUSY
0.5000 mL | PREFILLED_SYRINGE | Freq: Once | INTRAMUSCULAR | Status: AC
  Administered 2024-01-31: 0.5 mL via INTRAMUSCULAR
  Filled 2024-01-31: qty 0.5

## 2024-01-31 NOTE — ED Triage Notes (Signed)
 Pt presents to the ED POV from home with mother. Pt had a near syncopal episode today pta. Pt reports that she got really dizzy and nearly fell to the floor.  Pt reports that she had diarrhea and nausea yesterday.  CBG 73 BP 73/56

## 2024-01-31 NOTE — ED Notes (Signed)
 Pt transport to CT

## 2024-01-31 NOTE — ED Provider Notes (Addendum)
 [facility] Provider Note    Event Date/Time   First MD Initiated Contact with Patient 01/31/24 2130     (approximate)   History   Near Syncope   HPI  Terri Wood is a 58 y.o. female with diabetes who comes in with concerns for syncopal episode.  Patient states that she had some diarrhea and nausea yesterday.  She states that she did eat and drink a little bit less yesterday.  She states that this is resolved and she denies any abdominal pain.  She states that she went to go stand up today when she felt lightheaded and dizzy then fell to the ground.  She states that she did not hit her head but she is on Xarelto.  She did report having abrasion to her right arm but denies any pain there.   Physical Exam   Triage Vital Signs: ED Triage Vitals  Encounter Vitals Group     BP 01/31/24 2124 (!) 73/56     Systolic BP Percentile --      Diastolic BP Percentile --      Pulse Rate 01/31/24 2120 70     Resp 01/31/24 2120 18     Temp 01/31/24 2120 98.7 F (37.1 C)     Temp Source 01/31/24 2120 Oral     SpO2 01/31/24 2120 99 %     Weight 01/31/24 2121 223 lb (101.2 kg)     Height 01/31/24 2121 5\' 1"  (1.549 m)     Head Circumference --      Peak Flow --      Pain Score 01/31/24 2121 0     Pain Loc --      Pain Education --      Exclude from Growth Chart --     Most recent vital signs: Vitals:   01/31/24 2141 01/31/24 2245  BP: 90/61   Pulse: 67 64  Resp: 15 12  Temp:    SpO2: 96% 100%     General: Awake, no distress.  CV:  Good peripheral perfusion.  Resp:  Normal effort.  Abd:  No distention.  Soft and nontender Other:  Small abrasion noted on the right arm.  Mild C-spine tenderness No hematoma noted to the head.  Full range of motion of the right arm with no tenderness.  ED Results / Procedures / Treatments   Labs (all labs ordered are listed, but only abnormal results are displayed) Labs Reviewed  BASIC METABOLIC PANEL WITH GFR -  Abnormal; Notable for the following components:      Result Value   Glucose, Bld 100 (*)    BUN 21 (*)    Creatinine, Ser 1.82 (*)    GFR, Estimated 32 (*)    All other components within normal limits  CBC  URINALYSIS, ROUTINE W REFLEX MICROSCOPIC  HEPATIC FUNCTION PANEL  LIPASE, BLOOD  CBG MONITORING, ED  TROPONIN I (HIGH SENSITIVITY)     EKG  My interpretation of EKG:  Normal sinus rhythm 66 without any ST elevation or T wave inversions after lead III, normal intervals  RADIOLOGY Pending   PROCEDURES:  Critical Care performed: No  .1-3 Lead EKG Interpretation  Performed by: Concha Se, MD Authorized by: Concha Se, MD     Interpretation: normal     ECG rate:  60   ECG rate assessment: normal     Rhythm: sinus rhythm     Ectopy: none     Conduction: normal  MEDICATIONS ORDERED IN ED: Medications - No data to display   IMPRESSION / MDM / ASSESSMENT AND PLAN / ED COURSE  I reviewed the triage vital signs and the nursing notes.   Patient's presentation is most consistent with acute presentation with potential threat to life or bodily function.  Patient comes in with syncopal episode I suspect related to dehydration given low blood pressures and report of diarrhea yesterday this is all since resolved her abdomen soft and nontender labs ordered evaluate for Electra MIs, AKI.  She denies being on the ground a long time to suggest rhabdo.  Patient given 1 L of fluids.  CT imaging ordered to rule out intracranial hemorrhage, cervical fracture  CBC reassuring BMP shows elevated creatinine up to 1.8 patient getting some IV fluids.  No swelling in legs  Patient be handed off to oncoming team pending CT imaging, reevaluation after fluids suspect syncope was related to dehydration.  11:42 PM reevaluated patient updated on results.  Patient states that she would prefer to go home after discussion of whether or not she would like to be admitted to the hospital for  rehydration for kidney function.  She had a reassuring echo a year ago therefore patient placed 2 L of fluid.  She remains asymptomatic at this time and states that she would hopefully be able to go home if she does not feel lightheaded or have issues with ambulation.  Patient handed off pending reassessment   The patient is on the cardiac monitor to evaluate for evidence of arrhythmia and/or significant heart rate changes.      FINAL CLINICAL IMPRESSION(S) / ED DIAGNOSES   Final diagnoses:  Syncope and collapse  AKI (acute kidney injury) (HCC)  Diarrhea, unspecified type     Rx / DC Orders   ED Discharge Orders     None        Note:  This document was prepared using Dragon voice recognition software and may include unintentional dictation errors.   Lubertha Rush, MD 01/31/24 2257    Lubertha Rush, MD 01/31/24 2258    Lubertha Rush, MD 01/31/24 [phone]

## 2024-01-31 NOTE — ED Notes (Signed)
Lab to add on ordered labs

## 2024-01-31 NOTE — ED Provider Notes (Signed)
-----------------------------------------   11:33 PM on 01/31/2024 -----------------------------------------  Assuming care from Dr. Peggi Bowels.  In short, Terri Wood is a 58 y.o. female with a chief complaint of syncope, likely dehydration/volume depletion in the setting of a day of diarrhea.  Refer to the original H&P for additional details.  The current plan of care is to rehydrate and reassess.   Clinical Course as of 02/01/24 0332  Fri Feb 01, 2024  0026 CK Total: 132 [CF]  0331 Patient's repeat metabolic panel after 2 L of fluids was improved with a creatinine of 1.4, down from greater than 1.8.  I reassessed the patient.  Her blood pressure is still low but she is very comfortable at this time.  She says she has had no more diarrhea.  I offered to admit her to the hospital but she would much rather go home and I think that is reasonable and appropriate.  She will take it very slow and easy when getting up and moving around and in the absence of any additional symptoms she should be appropriate for discharge and outpatient follow-up.  I recommended close follow-up and gave my usual and customary return precautions. [CF]    Clinical Course User Index [CF] Lynnda Sas, MD     Medications  lactated ringers  bolus 1,000 mL (0 mLs Intravenous Stopped 02/01/24 0113)  Tdap (BOOSTRIX ) injection 0.5 mL (0.5 mLs Intramuscular Given 01/31/24 2304)  lactated ringers  bolus 1,000 mL (0 mLs Intravenous Stopped 02/01/24 0155)     ED Discharge Orders     None      Final diagnoses:  Syncope and collapse  AKI (acute kidney injury) (HCC)  Diarrhea, unspecified type     Lynnda Sas, MD 02/01/24 [phone]

## 2024-02-01 DIAGNOSIS — S40811A Abrasion of right upper arm, initial encounter: Secondary | ICD-10-CM | POA: Diagnosis not present

## 2024-02-01 LAB — HEPATIC FUNCTION PANEL
ALT: 24 U/L (ref 0–44)
AST: 26 U/L (ref 15–41)
Albumin: 4 g/dL (ref 3.5–5.0)
Alkaline Phosphatase: 83 U/L (ref 38–126)
Bilirubin, Direct: <0.1 mg/dL (ref 0.0–0.2)
Total Bilirubin: 0.6 mg/dL (ref 0.0–1.2)
Total Protein: 7.2 g/dL (ref 6.5–8.1)

## 2024-02-01 LAB — URINALYSIS, ROUTINE W REFLEX MICROSCOPIC
Bilirubin Urine: NEGATIVE
Glucose, UA: NEGATIVE mg/dL
Hgb urine dipstick: NEGATIVE
Ketones, ur: NEGATIVE mg/dL
Leukocytes,Ua: NEGATIVE
Nitrite: NEGATIVE
Protein, ur: NEGATIVE mg/dL
Specific Gravity, Urine: 1.008 (ref 1.005–1.030)
pH: 6 (ref 5.0–8.0)

## 2024-02-01 LAB — BASIC METABOLIC PANEL WITH GFR
Anion gap: 8 (ref 5–15)
BUN: 20 mg/dL (ref 6–20)
CO2: 27 mmol/L (ref 22–32)
Calcium: 8.5 mg/dL — ABNORMAL LOW (ref 8.9–10.3)
Chloride: 101 mmol/L (ref 98–111)
Creatinine, Ser: 1.4 mg/dL — ABNORMAL HIGH (ref 0.44–1.00)
GFR, Estimated: 44 mL/min — ABNORMAL LOW (ref >60)
Glucose, Bld: 84 mg/dL (ref 70–99)
Potassium: 3.8 mmol/L (ref 3.5–5.1)
Sodium: 136 mmol/L (ref 135–145)

## 2024-02-01 LAB — LIPASE, BLOOD: Lipase: 31 U/L (ref 11–51)

## 2024-02-01 LAB — TROPONIN I (HIGH SENSITIVITY)
Troponin I (High Sensitivity): 2 ng/L (ref <18)
Troponin I (High Sensitivity): 4 ng/L (ref <18)

## 2024-02-01 LAB — CK: Total CK: 132 U/L (ref 38–234)

## 2024-02-01 NOTE — Discharge Instructions (Signed)

## 2024-02-11 ENCOUNTER — Other Ambulatory Visit: Payer: Self-pay | Admitting: Family

## 2024-02-11 ENCOUNTER — Other Ambulatory Visit: Payer: Self-pay

## 2024-02-12 ENCOUNTER — Other Ambulatory Visit: Payer: Self-pay

## 2024-02-12 MED ORDER — MOUNJARO 15 MG/0.5ML ~~LOC~~ SOAJ
15.0000 mg | SUBCUTANEOUS | 1 refills | Status: DC
  Filled 2024-02-12: qty 2, 28d supply, fill #0
  Filled 2024-03-07: qty 2, 28d supply, fill #1
  Filled 2024-04-03: qty 2, 28d supply, fill #2
  Filled 2024-05-02: qty 2, 28d supply, fill #3
  Filled 2024-05-28: qty 2, 28d supply, fill #4
  Filled 2024-06-24: qty 2, 28d supply, fill #5

## 2024-02-13 ENCOUNTER — Other Ambulatory Visit: Payer: Self-pay

## 2024-02-14 ENCOUNTER — Other Ambulatory Visit: Payer: Self-pay | Admitting: Family

## 2024-02-19 ENCOUNTER — Other Ambulatory Visit: Payer: Self-pay | Admitting: Family

## 2024-02-19 ENCOUNTER — Other Ambulatory Visit: Payer: Self-pay

## 2024-02-19 MED ORDER — GABAPENTIN 600 MG PO TABS: 600.0000 mg | ORAL_TABLET | Freq: Three times a day (TID) | ORAL | 1 refills | Status: DC

## 2024-02-21 ENCOUNTER — Other Ambulatory Visit: Payer: Self-pay

## 2024-02-21 MED ORDER — GABAPENTIN 600 MG PO TABS: 600.0000 mg | ORAL_TABLET | Freq: Three times a day (TID) | ORAL | 1 refills | Status: DC

## 2024-02-25 ENCOUNTER — Ambulatory Visit
Attending: Student in an Organized Health Care Education/Training Program | Admitting: Student in an Organized Health Care Education/Training Program

## 2024-02-25 ENCOUNTER — Encounter: Payer: Self-pay | Admitting: Student in an Organized Health Care Education/Training Program

## 2024-02-25 ENCOUNTER — Ambulatory Visit
Admission: RE | Admit: 2024-02-25 | Discharge: 2024-02-25 | Disposition: A | Discharge status: Home / Self Care | Source: Ambulatory Visit | Attending: Student in an Organized Health Care Education/Training Program | Admitting: Student in an Organized Health Care Education/Training Program

## 2024-02-25 DIAGNOSIS — G894 Chronic pain syndrome: Secondary | ICD-10-CM | POA: Diagnosis not present

## 2024-02-25 DIAGNOSIS — M47816 Spondylosis without myelopathy or radiculopathy, lumbar region: Secondary | ICD-10-CM | POA: Insufficient documentation

## 2024-02-25 MED ORDER — DEXAMETHASONE SODIUM PHOSPHATE 10 MG/ML IJ SOLN
20.0000 mg | Freq: Once | INTRAMUSCULAR | Status: AC
  Administered 2024-02-25: 20 mg

## 2024-02-25 MED ORDER — DIAZEPAM 5 MG PO TABS
10.0000 mg | ORAL_TABLET | ORAL | Status: AC
  Administered 2024-02-25: 10 mg via ORAL

## 2024-02-25 MED ORDER — ROPIVACAINE HCL 2 MG/ML IJ SOLN
INTRAMUSCULAR | Status: AC
  Filled 2024-02-25: qty 20

## 2024-02-25 MED ORDER — ROPIVACAINE HCL 2 MG/ML IJ SOLN
18.0000 mL | Freq: Once | INTRAMUSCULAR | Status: AC
  Administered 2024-02-25: 18 mL via PERINEURAL

## 2024-02-25 MED ORDER — LIDOCAINE HCL 2 % IJ SOLN
INTRAMUSCULAR | Status: AC
  Filled 2024-02-25: qty 20

## 2024-02-25 MED ORDER — DEXAMETHASONE SODIUM PHOSPHATE 10 MG/ML IJ SOLN
INTRAMUSCULAR | Status: AC
  Filled 2024-02-25: qty 2

## 2024-02-25 MED ORDER — DIAZEPAM 5 MG PO TABS
ORAL_TABLET | ORAL | Status: AC
  Filled 2024-02-25: qty 2

## 2024-02-25 MED ORDER — LIDOCAINE HCL 2 % IJ SOLN
20.0000 mL | Freq: Once | INTRAMUSCULAR | Status: AC
  Administered 2024-02-25: 400 mg

## 2024-02-25 NOTE — Progress Notes (Signed)
 Safety precautions to be maintained throughout the outpatient stay will include: orient to surroundings, keep bed in low position, maintain call bell within reach at all times, provide assistance with transfer out of bed and ambulation.

## 2024-02-25 NOTE — Patient Instructions (Signed)
 ____________________________________________________________________________________________  Post-Procedure Discharge Instructions  Instructions: Apply ice:  Purpose: This will minimize any swelling and discomfort after procedure.  When: Day of procedure, as soon as you get home. How: Fill a plastic sandwich bag with crushed ice. Cover it with a small towel and apply to injection site. How long: (15 min on, 15 min off) Apply for 15 minutes then remove x 15 minutes.  Repeat sequence on day of procedure, until you go to bed. Apply heat:  Purpose: To treat any soreness and discomfort from the procedure. When: Starting the next day after the procedure. How: Apply heat to procedure site starting the day following the procedure. How long: May continue to repeat daily, until discomfort goes away. Food intake: Start with clear liquids (like water) and advance to regular food, as tolerated.  Physical activities: Keep activities to a minimum for the first 8 hours after the procedure. After that, then as tolerated. Driving: If you have received any sedation, be responsible and do not drive. You are not allowed to drive for 24 hours after having sedation. Blood thinner: (Applies only to those taking blood thinners) You may restart your blood thinner 6 hours after your procedure. Insulin: (Applies only to Diabetic patients taking insulin) As soon as you can eat, you may resume your normal dosing schedule. Infection prevention: Keep procedure site clean and dry. Shower daily and clean area with soap and water. Post-procedure Pain Diary: Extremely important that this be done correctly and accurately. Recorded information will be used to determine the next step in treatment. For the purpose of accuracy, follow these rules: Evaluate only the area treated. Do not report or include pain from an untreated area. For the purpose of this evaluation, ignore all other areas of pain, except for the treated area. After  your procedure, avoid taking a long nap and attempting to complete the pain diary after you wake up. Instead, set your alarm clock to go off every hour, on the hour, for the initial 8 hours after the procedure. Document the duration of the numbing medicine, and the relief you are getting from it. Do not go to sleep and attempt to complete it later. It will not be accurate. If you received sedation, it is likely that you were given a medication that may cause amnesia. Because of this, completing the diary at a later time may cause the information to be inaccurate. This information is needed to plan your care. Follow-up appointment: Keep your post-procedure follow-up evaluation appointment after the procedure (usually 2 weeks for most procedures, 6 weeks for radiofrequencies). DO NOT FORGET to bring you pain diary with you.   Expect: (What should I expect to see with my procedure?) From numbing medicine (AKA: Local Anesthetics): Numbness or decrease in pain. You may also experience some weakness, which if present, could last for the duration of the local anesthetic. Onset: Full effect within 15 minutes of injected. Duration: It will depend on the type of local anesthetic used. On the average, 1 to 8 hours.  From steroids (Applies only if steroids were used): Decrease in swelling or inflammation. Once inflammation is improved, relief of the pain will follow. Onset of benefits: Depends on the amount of swelling present. The more swelling, the longer it will take for the benefits to be seen. In some cases, up to 10 days. Duration: Steroids will stay in the system x 2 weeks. Duration of benefits will depend on multiple posibilities including persistent irritating factors. Side-effects: If present, they  may typically last 2 weeks (the duration of the steroids). Frequent: Cramps (if they occur, drink Gatorade and take over-the-counter Magnesium 450-500 mg once to twice a day); water retention with temporary  weight gain; increases in blood sugar; decreased immune system response; increased appetite. Occasional: Facial flushing (red, warm cheeks); mood swings; menstrual changes. Uncommon: Long-term decrease or suppression of natural hormones; bone thinning. (These are more common with higher doses or more frequent use. This is why we prefer that our patients avoid having any injection therapies in other practices.)  Very Rare: Severe mood changes; psychosis; aseptic necrosis. From procedure: Some discomfort is to be expected once the numbing medicine wears off. This should be minimal if ice and heat are applied as instructed.  Call if: (When should I call?) You experience numbness and weakness that gets worse with time, as opposed to wearing off. New onset bowel or bladder incontinence. (Applies only to procedures done in the spine)  Emergency Numbers: Durning business hours (Monday - Thursday, 8:00 AM - 4:00 PM) (Friday, 9:00 AM - 12:00 Noon): [phone] After hours: [phone] NOTE: If you are having a problem and are unable connect with, or to talk to a provider, then go to your nearest urgent care or emergency department. If the problem is serious and urgent, please call 911. ____________________________________________________________________________________________  Facet Blocks Patient Information  Description: The facets are joints in the spine between the vertebrae.  Like any joints in the body, facets can become irritated and painful.  Arthritis can also effect the facets.  By injecting steroids and local anesthetic in and around these joints, we can temporarily block the nerve supply to them.  Steroids act directly on irritated nerves and tissues to reduce selling and inflammation which often leads to decreased pain.  Facet blocks may be done anywhere along the spine from the neck to the low back depending upon the location of your pain.   After numbing the skin with local anesthetic  (like Novocaine), a small needle is passed onto the facet joints under x-ray guidance.  You may experience a sensation of pressure while this is being done.  The entire block usually lasts about 15-25 minutes.   Conditions which may be treated by facet blocks:  Low back/buttock pain Neck/shoulder pain Certain types of headaches  Preparation for the injection:  Do not eat any solid food or dairy products within 8 hours of your appointment. You may drink clear liquid up to 3 hours before appointment.  Clear liquids include water, black coffee, juice or soda.  No milk or cream please. You may take your regular medication, including pain medications, with a sip of water before your appointment.  Diabetics should hold regular insulin (if taken separately) and take 1/2 normal NPH dose the morning of the procedure.  Carry some sugar containing items with you to your appointment. A driver must accompany you and be prepared to drive you home after your procedure. Bring all your current medications with you. An IV may be inserted and sedation may be given at the discretion of the physician. A blood pressure cuff, EKG and other monitors will often be applied during the procedure.  Some patients may need to have extra oxygen administered for a short period. You will be asked to provide medical information, including your allergies and medications, prior to the procedure.  We must know immediately if you are taking blood thinners (like Coumadin/Warfarin) or if you are allergic to IV iodine contrast (dye).  We must know if you could possible be pregnant.  Possible side-effects:  Bleeding from needle site Infection (rare, may require surgery) Nerve injury (rare) Numbness & tingling (temporary) Difficulty urinating (rare, temporary) Spinal headache (a headache worse with upright posture) Light-headedness (temporary) Pain at injection site (serveral days) Decreased blood pressure (rare,  temporary) Weakness in arm/leg (temporary) Pressure sensation in back/neck (temporary)   Call if you experience:  Fever/chills associated with headache or increased back/neck pain Headache worsened by an upright position New onset, weakness or numbness of an extremity below the injection site Hives or difficulty breathing (go to the emergency room) Inflammation or drainage at the injection site(s) Severe back/neck pain greater than usual New symptoms which are concerning to you  Please note:  Although the local anesthetic injected can often make your back or neck feel good for several hours after the injection, the pain will likely return. It takes 3-7 days for steroids to work.  You may not notice any pain relief for at least one week.  If effective, we will often do a series of 2-3 injections spaced 3-6 weeks apart to maximally decrease your pain.  After the initial series, you may be a candidate for a more permanent nerve block of the facets.  If you have any questions, please call [phone] [facility]

## 2024-02-25 NOTE — Progress Notes (Signed)
 PROVIDER NOTE: Interpretation of information contained herein should be left to medically-trained personnel. Specific patient instructions are provided elsewhere under "Patient Instructions" section of medical record. This document was created in part using STT-dictation technology, any transcriptional errors that may result from this process are unintentional.  Patient: Terri Wood Type: Established DOB: 11-13-1965 MRN: [id number] PCP: Trenda Frisk, FNP  Service: Procedure DOS: 02/25/2024 Setting: Ambulatory Location: Ambulatory outpatient facility Delivery: Face-to-face Provider: Cephus Collin, MD Specialty: Interventional Pain Management Specialty designation: 09 Location: Outpatient facility Ref. Prov.: Cephus Collin, MD       Interventional Therapy   Type: Lumbar Facet, Medial Branch Block(s) (w/ fluoroscopic mapping) #1  Laterality: Bilateral  Level: L3, L4, and L5 Medial Branch Level(s). Injecting these levels blocks the L3-4 and L4-5 lumbar facet joints.  Imaging: Fluoroscopic guidance Spinal ([id number]) Anesthesia: Local anesthesia (1-2% Lidocaine ) Sedation: Minimal Sedation                       DOS: 02/25/2024 Performed by: Cephus Collin, MD  Primary Purpose: Diagnostic/Therapeutic Indications: Low back pain severe enough to impact quality of life or function. 1. Lumbar facet arthropathy   2. Lumbar spondylosis   3. Chronic pain syndrome    NAS-11 Pain score:   Pre-procedure: 8 /10   Post-procedure: 0-No pain/10   Patient stopped her Xarelto  3 days prior   Position / Prep / Materials:  Position: Prone  Prep solution: ChloraPrep (2% chlorhexidine  gluconate and 70% isopropyl alcohol) Area Prepped: Posterolateral Lumbosacral Spine (Wide prep: From the lower border of the scapula down to the end of the tailbone and from flank to flank.)  Materials:  Tray: Block Needle(s):  Type: Spinal  Gauge (G): 22  Length: 5-in Qty: 2     H&P (Pre-op Assessment):  Terri Wood is a 58 y.o. (year old), female patient, seen today for interventional treatment. She  has a past surgical history that includes Abdominal surgery (1996); Plantar fascia surgery (Right, 2009); Total hip arthroplasty (Left, 04/09/2018); Total knee arthroplasty (Right, 07/12/2021); Gastric bypass open (1996); Tarsal tunnel release (Right, 2009); Total knee arthroplasty (Left, 02/16/2022); and Reverse shoulder arthroplasty (Right, 08/23/2023). Terri Wood has a current medication list which includes the following prescription(s): accu-chek guide, amiodarone , vitamin c with rose hips, atorvastatin , azelastine , b-complex with vitamin c, biotin, black cohosh, buprenorphine , bupropion , vitamin d3, diclofenac sodium, duloxetine , gabapentin , gemtesa , hydrocodone -acetaminophen , lactobacillus-inulin, lisinopril -hydrochlorothiazide , magnesium , metoprolol  succinate, metoprolol  tartrate, pantoprazole , sod fluoride -potassium nitrate , mounjaro , trazodone , vitamin e, vortioxetine  hbr, clopidogrel , and rivaroxaban . Her primarily concern today is the Back Pain (Lower to mid)  Initial Vital Signs:  Pulse/HCG Rate: 61ECG Heart Rate: 67 Temp: (!) 96.6 F (35.9 C) Resp: 18 BP: 118/63 SpO2: 94 %  BMI: Estimated body mass index is 42.51 kg/m as calculated from the following:   Height as of this encounter: 5\' 1"  (1.549 m).   Weight as of this encounter: 225 lb (102.1 kg).  Risk Assessment: Allergies: Reviewed. She is allergic to penicillins, levofloxacin , oxycodone , veozah  [fezolinetant ], and vilazodone.  Allergy Precautions: None required Coagulopathies: Reviewed. None identified.  Blood-thinner therapy: None at this time Active Infection(s): Reviewed. None identified. Terri Wood is afebrile  Site Confirmation: Terri Wood was asked to confirm the procedure and laterality before marking the site Procedure checklist: Completed Consent: Before the procedure and under the influence of no sedative(s), amnesic(s), or  anxiolytics, the patient was informed of the treatment options, risks and possible complications. To fulfill our ethical and legal obligations, as recommended  by the American Medical Association's Code of Ethics, I have informed the patient of my clinical impression; the nature and purpose of the treatment or procedure; the risks, benefits, and possible complications of the intervention; the alternatives, including doing nothing; the risk(s) and benefit(s) of the alternative treatment(s) or procedure(s); and the risk(s) and benefit(s) of doing nothing. The patient was provided information about the general risks and possible complications associated with the procedure. These may include, but are not limited to: failure to achieve desired goals, infection, bleeding, organ or nerve damage, allergic reactions, paralysis, and death. In addition, the patient was informed of those risks and complications associated to Spine-related procedures, such as failure to decrease pain; infection (i.e.: Meningitis, epidural or intraspinal abscess); bleeding (i.e.: epidural hematoma, subarachnoid hemorrhage, or any other type of intraspinal or peri-dural bleeding); organ or nerve damage (i.e.: Any type of peripheral nerve, nerve root, or spinal cord injury) with subsequent damage to sensory, motor, and/or autonomic systems, resulting in permanent pain, numbness, and/or weakness of one or several areas of the body; allergic reactions; (i.e.: anaphylactic reaction); and/or death. Furthermore, the patient was informed of those risks and complications associated with the medications. These include, but are not limited to: allergic reactions (i.e.: anaphylactic or anaphylactoid reaction(s)); adrenal axis suppression; blood sugar elevation that in diabetics may result in ketoacidosis or comma; water retention that in patients with history of congestive heart failure may result in shortness of breath, pulmonary edema, and decompensation  with resultant heart failure; weight gain; swelling or edema; medication-induced neural toxicity; particulate matter embolism and blood vessel occlusion with resultant organ, and/or nervous system infarction; and/or aseptic necrosis of one or more joints. Finally, the patient was informed that Medicine is not an exact science; therefore, there is also the possibility of unforeseen or unpredictable risks and/or possible complications that may result in a catastrophic outcome. The patient indicated having understood very clearly. We have given the patient no guarantees and we have made no promises. Enough time was given to the patient to ask questions, all of which were answered to the patient's satisfaction. Terri Wood has indicated that she wanted to continue with the procedure. Attestation: I, the ordering provider, attest that I have discussed with the patient the benefits, risks, side-effects, alternatives, likelihood of achieving goals, and potential problems during recovery for the procedure that I have provided informed consent. Date  Time: 02/25/2024 12:58 PM  Pre-Procedure Preparation:  Monitoring: As per clinic protocol. Respiration, ETCO2, SpO2, BP, heart rate and rhythm monitor placed and checked for adequate function Safety Precautions: Patient was assessed for positional comfort and pressure points before starting the procedure. Time-out: I initiated and conducted the "Time-out" before starting the procedure, as per protocol. The patient was asked to participate by confirming the accuracy of the "Time Out" information. Verification of the correct person, site, and procedure were performed and confirmed by me, the nursing staff, and the patient. "Time-out" conducted as per Joint Commission's Universal Protocol (UP.01.01.01). Time: 1325 Start Time: 1325 hrs.  Description of Procedure:          Laterality: (see above) Targeted Levels: (see above)  Safety Precautions: Aspiration looking for  blood return was conducted prior to all injections. At no point did we inject any substances, as a needle was being advanced. Before injecting, the patient was told to immediately notify me if she was experiencing any new onset of "ringing in the ears, or metallic taste in the mouth". No attempts were made at seeking any  paresthesias. Safe injection practices and needle disposal techniques used. Medications properly checked for expiration dates. SDV (single dose vial) medications used. After the completion of the procedure, all disposable equipment used was discarded in the proper designated medical waste containers. Local Anesthesia: Protocol guidelines were followed. The patient was positioned over the fluoroscopy table. The area was prepped in the usual manner. The time-out was completed. The target area was identified using fluoroscopy. A 12-in long, straight, sterile hemostat was used with fluoroscopic guidance to locate the targets for each level blocked. Once located, the skin was marked with an approved surgical skin marker. Once all sites were marked, the skin (epidermis, dermis, and hypodermis), as well as deeper tissues (fat, connective tissue and muscle) were infiltrated with a small amount of a short-acting local anesthetic, loaded on a 10cc syringe with a 25G, 1.5-in  Needle. An appropriate amount of time was allowed for local anesthetics to take effect before proceeding to the next step. Local Anesthetic: Lidocaine  2.0% The unused portion of the local anesthetic was discarded in the proper designated containers. Technical description of process:   L3 Medial Branch Nerve Block (MBB): The target area for the L3 medial branch is at the junction of the postero-lateral aspect of the superior articular process and the superior, posterior, and medial edge of the transverse process of L4. Under fluoroscopic guidance, a Quincke needle was inserted until contact was made with os over the superior  postero-lateral aspect of the pedicular shadow (target area). After negative aspiration for blood, 2mL of the nerve block solution was injected without difficulty or complication. The needle was removed intact. L4 Medial Branch Nerve Block (MBB): The target area for the L4 medial branch is at the junction of the postero-lateral aspect of the superior articular process and the superior, posterior, and medial edge of the transverse process of L5. Under fluoroscopic guidance, a Quincke needle was inserted until contact was made with os over the superior postero-lateral aspect of the pedicular shadow (target area). After negative aspiration for blood, 2mL of the nerve block solution was injected without difficulty or complication. The needle was removed intact. L5 Medial Branch Nerve Block (MBB): The target area for the L5 medial branch is at the junction of the postero-lateral aspect of the superior articular process and the superior, posterior, and medial edge of the sacral ala. Under fluoroscopic guidance, a Quincke needle was inserted until contact was made with os over the superior postero-lateral aspect of the pedicular shadow (target area). After negative aspiration for blood, 2mL of the nerve block solution was injected without difficulty or complication. The needle was removed intact.   Once the entire procedure was completed, the treated area was cleaned, making sure to leave some of the prepping solution back to take advantage of its long term bactericidal properties.         Illustration of the posterior view of the lumbar spine and the posterior neural structures. Laminae of L2 through S1 are labeled. DPRL5, dorsal primary ramus of L5; DPRS1, dorsal primary ramus of S1; DPR3, dorsal primary ramus of L3; FJ, facet (zygapophyseal) joint L3-L4; I, inferior articular process of L4; LB1, lateral branch of dorsal primary ramus of L1; IAB, inferior articular branches from L3 medial branch (supplies  L4-L5 facet joint); IBP, intermediate branch plexus; MB3, medial branch of dorsal primary ramus of L3; NR3, third lumbar nerve root; S, superior articular process of L5; SAB, superior articular branches from L4 (supplies L4-5 facet joint also); TP3, transverse process  of L3.   Facet Joint Innervation (* possible contribution)  L1-2 T12, L1 (L2*)  Medial Branch  L2-3 L1, L2 (L3*)         "          "  L3-4 L2, L3 (L4*)         "          "  L4-5 L3, L4 (L5*)         "          "  L5-S1 L4, L5, S1          "          "    Vitals:   02/25/24 1320 02/25/24 1325 02/25/24 1330 02/25/24 1334  BP: 112/74 115/74 110/69 114/63  Pulse:      Resp: 18 16 18 18   Temp:      SpO2: 98% 95% 95% 95%  Weight:      Height:         End Time: 1334 hrs.  Imaging Guidance (Spinal):          Type of Imaging Technique: Fluoroscopy Guidance (Spinal) Indication(s): Fluoroscopy guidance for needle placement to enhance accuracy in procedures requiring precise needle localization for targeted delivery of medication in or near specific anatomical locations not easily accessible without such real-time imaging assistance. Exposure Time: Please see nurses notes. Contrast: None used. Fluoroscopic Guidance: I was personally present during the use of fluoroscopy. "Tunnel Vision Technique" used to obtain the best possible view of the target area. Parallax error corrected before commencing the procedure. "Direction-depth-direction" technique used to introduce the needle under continuous pulsed fluoroscopy. Once target was reached, antero-posterior, oblique, and lateral fluoroscopic projection used confirm needle placement in all planes. Images permanently stored in EMR. Interpretation: No contrast injected. I personally interpreted the imaging intraoperatively. Adequate needle placement confirmed in multiple planes. Permanent images saved into the patient's record.  Post-operative Assessment:  Post-procedure Vital Signs:   Pulse/HCG Rate: 6169 Temp: (!) 96.6 F (35.9 C) Resp: 18 BP: 114/63 SpO2: 95 %  EBL: None  Complications: No immediate post-treatment complications observed by team, or reported by patient.  Note: The patient tolerated the entire procedure well. A repeat set of vitals were taken after the procedure and the patient was kept under observation following institutional policy, for this type of procedure. Post-procedural neurological assessment was performed, showing return to baseline, prior to discharge. The patient was provided with post-procedure discharge instructions, including a section on how to identify potential problems. Should any problems arise concerning this procedure, the patient was given instructions to immediately contact us , at any time, without hesitation. In any case, we plan to contact the patient by telephone for a follow-up status report regarding this interventional procedure.  Comments:  No additional relevant information.  Plan of Care (POC)  Orders:  Orders Placed This Encounter  Procedures   DG PAIN [facility] C-ARM 1-60 MIN NO REPORT    Intraoperative interpretation by procedural physician at [facility].    Standing Status:   Standing    Number of Occurrences:   1    Reason for exam::   Assistance in needle guidance and placement for procedures requiring needle placement in or near specific anatomical locations not easily accessible without such assistance.    Medications ordered for procedure: Meds ordered this encounter  Medications   lidocaine  (XYLOCAINE ) 2 % (with pres) injection 400 mg   diazepam (VALIUM) tablet 10 mg  Make sure Flumazenil is available in the pyxis when using this medication. If oversedation occurs, administer 0.2 mg IV over 15 sec. If after 45 sec no response, administer 0.2 mg again over 1 min; may repeat at 1 min intervals; not to exceed 4 doses (1 mg)   ropivacaine (PF) 2 mg/mL (0.2%) (NAROPIN) injection 18 mL    dexamethasone  (DECADRON ) injection 20 mg   Medications administered: We administered lidocaine , diazepam, ropivacaine (PF) 2 mg/mL (0.2%), and dexamethasone .  See the medical record for exact dosing, route, and time of administration.  Follow-up plan:   Return in about 3 weeks (around 03/17/2024) for PPE, F2F.       BLF L3-5 02/25/24    Recent Visits Date Type Provider Dept  01/22/24 Office Visit Cephus Collin, MD [facility]  12/25/23 Office Visit Cephus Collin, MD [facility]  Showing recent visits within past 90 days and meeting all other requirements Today's Visits Date Type Provider Dept  02/25/24 Procedure visit Cephus Collin, MD [facility]  Showing today's visits and meeting all other requirements Future Appointments Date Type Provider Dept  04/01/24 Appointment Cephus Collin, MD [facility]  Showing future appointments within next 90 days and meeting all other requirements  Disposition: Discharge home  Discharge (Date  Time): 02/25/2024; 1345 hrs.   Primary Care Physician: Trenda Frisk, FNP Location: [facility] Note by: Cephus Collin, MD (TTS technology used. I apologize for any typographical errors that were not detected and corrected.) Date: 02/25/2024; Time: 1:44 PM  Disclaimer:  Medicine is not an Visual merchandiser. The only guarantee in medicine is that nothing is guaranteed. It is important to note that the decision to proceed with this intervention was based on the information collected from the patient. The Data and conclusions were drawn from the patient's questionnaire, the interview, and the physical examination. Because the information was provided in large part by the patient, it cannot be guaranteed that it has not been purposely or unconsciously manipulated. Every effort has been made to obtain as much relevant data as possible for this evaluation. It is important to note that the conclusions  that lead to this procedure are derived in large part from the available data. Always take into account that the treatment will also be dependent on availability of resources and existing treatment guidelines, considered by other Pain Management Practitioners as being common knowledge and practice, at the time of the intervention. For Medico-Legal purposes, it is also important to point out that variation in procedural techniques and pharmacological choices are the acceptable norm. The indications, contraindications, technique, and results of the above procedure should only be interpreted and judged by a Board-Certified Interventional Pain Specialist with extensive familiarity and expertise in the same exact procedure and technique.

## 2024-02-26 ENCOUNTER — Encounter: Payer: Self-pay | Admitting: Family

## 2024-02-26 ENCOUNTER — Ambulatory Visit: Admitting: Family

## 2024-02-26 ENCOUNTER — Telehealth: Payer: Self-pay | Admitting: *Deleted

## 2024-02-26 VITALS — BP 124/68 | HR 73 | Ht 61.0 in | Wt 226.8 lb

## 2024-02-26 DIAGNOSIS — E1165 Type 2 diabetes mellitus with hyperglycemia: Secondary | ICD-10-CM

## 2024-02-26 DIAGNOSIS — F411 Generalized anxiety disorder: Secondary | ICD-10-CM | POA: Diagnosis not present

## 2024-02-26 DIAGNOSIS — F331 Major depressive disorder, recurrent, moderate: Secondary | ICD-10-CM

## 2024-02-26 DIAGNOSIS — Z0130 Encounter for examination of blood pressure without abnormal findings: Secondary | ICD-10-CM

## 2024-02-26 LAB — POC CREATINE & ALBUMIN,URINE
Creatinine, POC: 300 mg/dL
Microalbumin Ur, POC: 80 mg/L

## 2024-02-26 NOTE — Telephone Encounter (Signed)
 Attempted to call for post procedure follow-up. Message left.

## 2024-03-04 ENCOUNTER — Other Ambulatory Visit: Payer: Self-pay | Admitting: Family

## 2024-03-04 DIAGNOSIS — E782 Mixed hyperlipidemia: Secondary | ICD-10-CM

## 2024-03-04 DIAGNOSIS — I10 Essential (primary) hypertension: Secondary | ICD-10-CM

## 2024-03-04 DIAGNOSIS — M797 Fibromyalgia: Secondary | ICD-10-CM

## 2024-03-05 DIAGNOSIS — G4733 Obstructive sleep apnea (adult) (pediatric): Secondary | ICD-10-CM | POA: Diagnosis not present

## 2024-03-07 ENCOUNTER — Other Ambulatory Visit: Payer: Self-pay

## 2024-03-14 ENCOUNTER — Ambulatory Visit: Payer: Self-pay

## 2024-03-23 ENCOUNTER — Other Ambulatory Visit: Payer: Self-pay | Admitting: Family

## 2024-03-25 ENCOUNTER — Ambulatory Visit (INDEPENDENT_AMBULATORY_CARE_PROVIDER_SITE_OTHER): Admitting: Cardiovascular Disease

## 2024-03-25 ENCOUNTER — Encounter: Payer: Self-pay | Admitting: Cardiovascular Disease

## 2024-03-25 VITALS — BP 112/64 | HR 65 | Ht 61.0 in | Wt 225.0 lb

## 2024-03-25 DIAGNOSIS — E1165 Type 2 diabetes mellitus with hyperglycemia: Secondary | ICD-10-CM | POA: Diagnosis not present

## 2024-03-25 DIAGNOSIS — E782 Mixed hyperlipidemia: Secondary | ICD-10-CM

## 2024-03-25 DIAGNOSIS — G4733 Obstructive sleep apnea (adult) (pediatric): Secondary | ICD-10-CM

## 2024-03-25 DIAGNOSIS — R55 Syncope and collapse: Secondary | ICD-10-CM

## 2024-03-25 DIAGNOSIS — I10 Essential (primary) hypertension: Secondary | ICD-10-CM

## 2024-03-25 DIAGNOSIS — Z8679 Personal history of other diseases of the circulatory system: Secondary | ICD-10-CM

## 2024-03-25 NOTE — Progress Notes (Signed)
 Cardiology Office Note   Date:  03/25/2024   ID:  SRESHTA CRESSLER, DOB 1966/06/24, MRN [id number]  PCP:  Trenda Frisk, FNP  Cardiologist:  Debborah Fairly, MD      History of Present Illness: Terri Wood is a 58 y.o. female who presents for  Chief Complaint  Patient presents with   Follow-up    3 Months Follow Up    Feels tired      Past Medical History:  Diagnosis Date   Acute ischemic right MCA stroke (HCC) 08/15/2019   Acute respiratory failure (HCC) 01/16/2021   a.) in setting of CAP   Anxiety    Aortic atherosclerosis (HCC)    Atrial fibrillation (HCC)    a.) CHA2DS2-VASc = 5 (sex, HTN, CVA x2, vascular disease history) as of 08/20/2023; b.) cardiac rate/rhythm maintained on oral amiodarone  + metoprolol ; chronically anticoagulated using rivaroxaban ; on chronic clopidogrel    Avascular necrosis of left femoral head (HCC) 04/09/2018   CAD (coronary artery disease) 03/13/2023   a.) MV 03/13/2023: small mild reversible bas/mid anterosep/inferosep/apex defects c/w isch; b.) cCTA 03/29/2023: Ca2+ = 673.3 (99th %'ile)   Carotid artery disease (HCC) 08/16/2019   a.) doppler 08/16/2019: 40-59% LICA   Chronic midline low back pain with right-sided sciatica 02/11/2016   Community acquired pneumonia 01/16/2021   Depression    Essential hypertension    Fibromyalgia    Foraminal stenosis of lumbar region 02/27/2018   GERD (gastroesophageal reflux disease)    Long term current use of amiodarone     Lumbar radiculopathy 12/15/2016   Mixed hyperlipidemia    Multifocal pneumonia 06/05/2021   OAB (overactive bladder)    On chronic clopidogrel  therapy    On rivaroxaban  therapy    Osteoarthritis of left hip 2019   Restless leg syndrome 02/02/2015   Rotator cuff tendinitis, right 06/04/2023   Sepsis (HCC) 01/16/2021   T2DM (type 2 diabetes mellitus) (HCC)    Tendinitis of upper biceps tendon of right shoulder 06/04/2023   Traumatic complete tear of right rotator cuff  06/04/2023     Past Surgical History:  Procedure Laterality Date   ABDOMINAL SURGERY  1996   gastric bypass; stapling; surgilite   GASTRIC BYPASS OPEN  1996   PLANTAR FASCIA SURGERY Right 2009   REVERSE SHOULDER ARTHROPLASTY Right 08/23/2023   Procedure: REVERSE SHOULDER ARTHROPLASTY WITH BICEPS TENODESIS;  Surgeon: Elner Hahn, MD;  Location: [facility];  Service: Orthopedics;  Laterality: Right;   TARSAL TUNNEL RELEASE Right 2009   TOTAL HIP ARTHROPLASTY Left 04/09/2018   Procedure: TOTAL HIP ARTHROPLASTY ANTERIOR APPROACH;  Surgeon: Molli Angelucci, MD;  Location: [facility];  Service: Orthopedics;  Laterality: Left;   TOTAL KNEE ARTHROPLASTY Right 07/12/2021   Procedure: TOTAL KNEE ARTHROPLASTY;  Surgeon: Molli Angelucci, MD;  Location: [facility];  Service: Orthopedics;  Laterality: Right;   TOTAL KNEE ARTHROPLASTY Left 02/16/2022   Procedure: TOTAL KNEE ARTHROPLASTY;  Surgeon: Molli Angelucci, MD;  Location: [facility];  Service: Orthopedics;  Laterality: Left;     Current Outpatient Medications  Medication Sig Dispense Refill   ACCU-CHEK GUIDE test strip USE TO CHECK BLOOD GLUCOSE ONCE DAILY 100 strip 1   amiodarone  (PACERONE ) 200 MG tablet Take 1 tablet (200 mg total) by mouth daily as needed.     Ascorbic Acid (VITAMIN C WITH ROSE HIPS) 1000 MG tablet Take 1,000 mg by mouth daily.     atorvastatin  (LIPITOR ) 80 MG tablet TAKE 1 TABLET BY MOUTH EVERY DAY FOR CHOLESTEROL  90 tablet 3   azelastine  (ASTELIN ) 0.1 % nasal spray Place 2 sprays into both nostrils 2 (two) times daily. Use in each nostril as directed 30 mL 12   B Complex-C (B-COMPLEX WITH VITAMIN C) tablet Take 1 tablet by mouth daily.     BIOTIN PO Take 1 drop by mouth. With collagen     BLACK COHOSH EXTRACT PO Take 1 tablet by mouth daily.     buPROPion  (WELLBUTRIN  XL) 150 MG 24 hr tablet TAKE 1 TABLET (150 MG TOTAL) BY MOUTH IN THE MORNING 90 tablet 1   Cholecalciferol  (VITAMIN D3) 125 MCG (5000 UT) CAPS Take 5,000 Units by mouth  daily.     clopidogrel  (PLAVIX ) 75 MG tablet TAKE 1 TABLET BY MOUTH EVERY DAY 90 tablet 1   diclofenac Sodium (VOLTAREN) 1 % GEL Apply 2 g topically 4 (four) times daily. As needed     DULoxetine  (CYMBALTA ) 60 MG capsule TAKE 1 CAPSULE BY MOUTH TWICE A DAY 180 capsule 1   gabapentin  (NEURONTIN ) 600 MG tablet Take 1 tablet (600 mg total) by mouth 3 (three) times daily. Take 1 tablet (300 mg) by mouth in the morning & take 2 tablets (600 mg) by mouth at night. 270 tablet 1   GEMTESA  75 MG TABS TAKE 1 TABLET BY MOUTH AT BEDTIME. 90 tablet 1   HYDROcodone -acetaminophen  (NORCO) 10-325 MG tablet Take 1 tablet by mouth every 4 (four) hours as needed for moderate pain (pain score 4-6) or severe pain (pain score 7-10) (pain). 40 tablet 0   Lactobacillus-Inulin (PROBIOTIC DIGESTIVE SUPPORT PO) Take by mouth.     lisinopril -hydrochlorothiazide  (ZESTORETIC ) 10-12.5 MG tablet Take 1 tablet by mouth daily with supper.     Magnesium  500 MG CAPS Take 500 mg by mouth in the morning.     metoprolol  succinate (TOPROL -XL) 25 MG 24 hr tablet TAKE 1 TABLET (25 MG TOTAL) BY MOUTH DAILY. 90 tablet 1   metoprolol  tartrate (LOPRESSOR ) 50 MG tablet Take 1 tablet (50 mg total) by mouth 2 (two) times daily. 180 tablet 1   pantoprazole  (PROTONIX ) 40 MG tablet TAKE 1 TABLET BY MOUTH EVERY DAY 90 tablet 1   rivaroxaban  (XARELTO ) 20 MG TABS tablet Take 1 tablet (20 mg total) by mouth daily with supper. 30 tablet 3   Sod Fluoride -Potassium Nitrate  (PREVIDENT 5000 ENAMEL PROTECT) 1.1-5 % GEL Use paste to brush teeth 2 (two) times daily morning and night. Do not eat or drink 30 minutes after use. 100 mL 3   tirzepatide  (MOUNJARO ) 15 MG/0.5ML Pen Inject 15 mg into the skin once a week. (Patient not taking: Reported on 02/26/2024) 6 mL 1   traZODone  (DESYREL ) 50 MG tablet TAKE 1 TABLET BY MOUTH EVERYDAY AT BEDTIME 90 tablet 1   VITAMIN E PO Take 1 capsule by mouth every evening.     vortioxetine  HBr (TRINTELLIX ) 5 MG TABS tablet Take 1  tablet (5 mg total) by mouth in the morning. 30 tablet 3   No current facility-administered medications for this visit.    Allergies:   Penicillins, Levofloxacin , Oxycodone , Veozah  [fezolinetant ], and Vilazodone    Social History:   reports that she quit smoking about 4 years ago. Her smoking use included cigarettes. She has never used smokeless tobacco. She reports that she does not currently use alcohol. She reports that she does not currently use drugs after having used the following drugs: Marijuana.   Family History:  family history includes Aneurysm in her mother; Heart disease in her  father.    ROS:     Review of Systems  Constitutional: Negative.   HENT: Negative.    Eyes: Negative.   Respiratory: Negative.    Gastrointestinal: Negative.   Genitourinary: Negative.   Musculoskeletal: Negative.   Skin: Negative.   Neurological: Negative.   Endo/Heme/Allergies: Negative.   Psychiatric/Behavioral: Negative.    All other systems reviewed and are negative.     All other systems are reviewed and negative.    PHYSICAL EXAM: VS:  BP 112/64   Pulse 65   Ht 5\' 1"  (1.549 m)   Wt 225 lb (102.1 kg)   LMP 04/01/2018 (Exact Date)   SpO2 97%   BMI 42.51 kg/m  , BMI Body mass index is 42.51 kg/m. Last weight:  Wt Readings from Last 3 Encounters:  03/25/24 225 lb (102.1 kg)  02/26/24 226 lb 12.8 oz (102.9 kg)  02/25/24 225 lb (102.1 kg)     Physical Exam Constitutional:      Appearance: Normal appearance.  Cardiovascular:     Rate and Rhythm: Normal rate and regular rhythm.     Heart sounds: Normal heart sounds.  Pulmonary:     Effort: Pulmonary effort is normal.     Breath sounds: Normal breath sounds.  Musculoskeletal:     Right lower leg: No edema.     Left lower leg: No edema.  Neurological:     Mental Status: She is alert.       EKG:   Recent Labs: 01/23/2024: TSH 0.984 01/31/2024: ALT 24; Hemoglobin 13.2; Platelets 305 02/01/2024: BUN 20; Creatinine,  Ser 1.40; Potassium 3.8; Sodium 136    Lipid Panel    Component Value Date/Time   CHOL 116 01/23/2024 1346   TRIG 116 01/23/2024 1346   HDL 51 01/23/2024 1346   CHOLHDL 2.3 01/23/2024 1346   CHOLHDL 3.6 08/16/2019 0516   VLDL 26 08/16/2019 0516   LDLCALC 44 01/23/2024 1346      Other studies Reviewed: Additional studies/ records that were reviewed today include:  Review of the above records demonstrates:       No data to display            ASSESSMENT AND PLAN:    ICD-10-CM   1. Obstructive sleep apnea  G47.33 PCV ECHOCARDIOGRAM COMPLETE    US  Carotid Bilateral    MYOCARDIAL PERFUSION IMAGING   needs to start cpap, as feels tired    2. Type 2 diabetes mellitus with hyperglycemia, without long-term current use of insulin  (HCC)  E11.65 PCV ECHOCARDIOGRAM COMPLETE    US  Carotid Bilateral    MYOCARDIAL PERFUSION IMAGING    3. Essential hypertension, benign  I10 PCV ECHOCARDIOGRAM COMPLETE    US  Carotid Bilateral    MYOCARDIAL PERFUSION IMAGING    4. Mixed hyperlipidemia  E78.2 PCV ECHOCARDIOGRAM COMPLETE    US  Carotid Bilateral    MYOCARDIAL PERFUSION IMAGING    5. Atrial fibrillation, currently in sinus rhythm  Z86.79 PCV ECHOCARDIOGRAM COMPLETE    US  Carotid Bilateral    MYOCARDIAL PERFUSION IMAGING    6. Syncope, unspecified syncope type  R55 PCV ECHOCARDIOGRAM COMPLETE    US  Carotid Bilateral    MYOCARDIAL PERFUSION IMAGING   Was dehydrated and was in ER 4/25. Advise echo, caroted dopplers, stress test. As this never happened before.       Problem List Items Addressed This Visit       Cardiovascular and Mediastinum   Essential hypertension, benign   Relevant Orders  PCV ECHOCARDIOGRAM COMPLETE   US  Carotid Bilateral   MYOCARDIAL PERFUSION IMAGING     Endocrine   Type 2 diabetes mellitus with hyperglycemia, without long-term current use of insulin  (HCC)   Relevant Orders   PCV ECHOCARDIOGRAM COMPLETE   US  Carotid Bilateral   MYOCARDIAL  PERFUSION IMAGING     Other   Hyperlipidemia   Relevant Orders   PCV ECHOCARDIOGRAM COMPLETE   US  Carotid Bilateral   MYOCARDIAL PERFUSION IMAGING   Other Visit Diagnoses       Obstructive sleep apnea    -  Primary   needs to start cpap, as feels tired   Relevant Orders   PCV ECHOCARDIOGRAM COMPLETE   US  Carotid Bilateral   MYOCARDIAL PERFUSION IMAGING     Atrial fibrillation, currently in sinus rhythm       Relevant Orders   PCV ECHOCARDIOGRAM COMPLETE   US  Carotid Bilateral   MYOCARDIAL PERFUSION IMAGING     Syncope, unspecified syncope type       Was dehydrated and was in ER 4/25. Advise echo, caroted dopplers, stress test. As this never happened before.   Relevant Orders   PCV ECHOCARDIOGRAM COMPLETE   US  Carotid Bilateral   MYOCARDIAL PERFUSION IMAGING          Disposition:   Return in about 3 weeks (around 04/15/2024) for stress test, echo and caroted dopplers and f/u.    Total time spent: 35 minutes  Signed,  Debborah Fairly, MD  03/25/2024 1:51 PM    [facility]

## 2024-03-31 ENCOUNTER — Inpatient Hospital Stay
Admission: RE | Admit: 2024-03-31 | Discharge: 2024-03-31 | Disposition: A | Discharge status: Home / Self Care | Payer: Self-pay | Source: Ambulatory Visit | Attending: Family | Admitting: Family

## 2024-03-31 ENCOUNTER — Other Ambulatory Visit: Payer: Self-pay | Admitting: *Deleted

## 2024-03-31 DIAGNOSIS — Z1231 Encounter for screening mammogram for malignant neoplasm of breast: Secondary | ICD-10-CM

## 2024-04-01 ENCOUNTER — Ambulatory Visit: Admitting: Student in an Organized Health Care Education/Training Program

## 2024-04-02 ENCOUNTER — Other Ambulatory Visit

## 2024-04-03 ENCOUNTER — Other Ambulatory Visit: Payer: Self-pay

## 2024-04-03 ENCOUNTER — Encounter

## 2024-04-08 ENCOUNTER — Encounter: Payer: Self-pay | Admitting: Student in an Organized Health Care Education/Training Program

## 2024-04-08 ENCOUNTER — Ambulatory Visit
Attending: Student in an Organized Health Care Education/Training Program | Admitting: Student in an Organized Health Care Education/Training Program

## 2024-04-08 VITALS — BP 120/71 | HR 65 | Temp 97.5°F | Resp 16 | Ht 61.0 in | Wt 225.0 lb

## 2024-04-08 DIAGNOSIS — G894 Chronic pain syndrome: Secondary | ICD-10-CM | POA: Diagnosis not present

## 2024-04-08 DIAGNOSIS — M47816 Spondylosis without myelopathy or radiculopathy, lumbar region: Secondary | ICD-10-CM | POA: Insufficient documentation

## 2024-04-08 NOTE — Progress Notes (Signed)
 Safety precautions to be maintained throughout the outpatient stay will include: orient to surroundings, keep bed in low position, maintain call bell within reach at all times, provide assistance with transfer out of bed and ambulation.

## 2024-04-08 NOTE — Patient Instructions (Addendum)
 stop Xarelto  3 days before   Post-Procedure Discharge Instructions  Instructions: Apply ice:  Purpose: This will minimize any swelling and discomfort after procedure.  When: Day of procedure, as soon as you get home. How: Fill a plastic sandwich bag with crushed ice. Cover it with a small towel and apply to injection site. How long: (15 min on, 15 min off) Apply for 15 minutes then remove x 15 minutes.  Repeat sequence on day of procedure, until you go to bed. Apply heat:  Purpose: To treat any soreness and discomfort from the procedure. When: Starting the next day after the procedure. How: Apply heat to procedure site starting the day following the procedure. How long: May continue to repeat daily, until discomfort goes away. Food intake: Start with clear liquids (like water) and advance to regular food, as tolerated.  Physical activities: Keep activities to a minimum for the first 8 hours after the procedure. After that, then as tolerated. Driving: If you have received any sedation, be responsible and do not drive. You are not allowed to drive for 24 hours after having sedation. Blood thinner: (Applies only to those taking blood thinners) You may restart your blood thinner 6 hours after your procedure. Insulin : (Applies only to Diabetic patients taking insulin ) As soon as you can eat, you may resume your normal dosing schedule. Infection prevention: Keep procedure site clean and dry. Shower daily and clean area with soap and water. Post-procedure Pain Diary: Extremely important that this be done correctly and accurately. Recorded information will be used to determine the next step in treatment. For the purpose of accuracy, follow these rules: Evaluate only the area treated. Do not report or include pain from an untreated area. For the purpose of this evaluation, ignore all other areas of pain, except for the treated area. After your procedure, avoid taking a long nap and attempting to  complete the pain diary after you wake up. Instead, set your alarm clock to go off every hour, on the hour, for the initial 8 hours after the procedure. Document the duration of the numbing medicine, and the relief you are getting from it. Do not go to sleep and attempt to complete it later. It will not be accurate. If you received sedation, it is likely that you were given a medication that may cause amnesia. Because of this, completing the diary at a later time may cause the information to be inaccurate. This information is needed to plan your care. Follow-up appointment: Keep your post-procedure follow-up evaluation appointment after the procedure (usually 2 weeks for most procedures, 6 weeks for radiofrequencies). DO NOT FORGET to bring you pain diary with you.   Expect: (What should I expect to see with my procedure?) From numbing medicine (AKA: Local Anesthetics): Numbness or decrease in pain. You may also experience some weakness, which if present, could last for the duration of the local anesthetic. Onset: Full effect within 15 minutes of injected. Duration: It will depend on the type of local anesthetic used. On the average, 1 to 8 hours.  From steroids (Applies only if steroids were used): Decrease in swelling or inflammation. Once inflammation is improved, relief of the pain will follow. Onset of benefits: Depends on the amount of swelling present. The more swelling, the longer it will take for the benefits to be seen. In some cases, up to 10 days. Duration: Steroids will stay in the system x 2 weeks. Duration of benefits will depend on multiple posibilities including persistent irritating  factors. Side-effects: If present, they may typically last 2 weeks (the duration of the steroids). Frequent: Cramps (if they occur, drink Gatorade and take over-the-counter Magnesium  450-500 mg once to twice a day); water retention with temporary weight gain; increases in blood sugar; decreased immune system  response; increased appetite. Occasional: Facial flushing (red, warm cheeks); mood swings; menstrual changes. Uncommon: Long-term decrease or suppression of natural hormones; bone thinning. (These are more common with higher doses or more frequent use. This is why we prefer that our patients avoid having any injection therapies in other practices.)  Very Rare: Severe mood changes; psychosis; aseptic necrosis. From procedure: Some discomfort is to be expected once the numbing medicine wears off. This should be minimal if ice and heat are applied as instructed.  Call if: (When should I call?) You experience numbness and weakness that gets worse with time, as opposed to wearing off. New onset bowel or bladder incontinence. (Applies only to procedures done in the spine)  Emergency Numbers: Durning business hours (Monday - Thursday, 8:00 AM - 4:00 PM) (Friday, 9:00 AM - 12:00 Noon): [phone] After hours: [phone] NOTE: If you are having a problem and are unable connect with, or to talk to a provider, then go to your nearest urgent care or emergency department. If the problem is serious and urgent, please call 911.

## 2024-04-08 NOTE — Progress Notes (Signed)
 PROVIDER NOTE: Interpretation of information contained herein should be left to medically-trained personnel. Specific patient instructions are provided elsewhere under Patient Instructions section of medical record. This document was created in part using AI and STT-dictation technology, any transcriptional errors that may result from this process are unintentional.  Patient: Terri Wood  Service: E/M   PCP: Orlean Alan HERO, FNP  DOB: 1966/07/09  DOS: 04/08/2024  Provider: Wallie Sherry, MD  MRN: [id number]  Delivery: Face-to-face  Specialty: Interventional Pain Management  Type: Established Patient  Setting: Ambulatory outpatient facility  Specialty designation: 09  Referring Prov.: Orlean Alan HERO, FNP  Location: Outpatient office facility       History of present illness (HPI) [name], a 58 y.o. year old female, is here today because of her Lumbar facet arthropathy [M47.816]. [name] primary complain today is Back Pain  Pertinent problems: [name] has Fibromyalgia; Morbid obesity (HCC); Lumbar facet arthropathy; Lumbar radiculopathy; and Chronic pain syndrome on their pertinent problem list.  Pain Assessment: Severity of Chronic pain is reported as a 8 /10. Location: Back Lower, Medial/Can radaiate down left thigh to left knee but not as constant as prior to procedure. Onset: More than a month ago. Quality: [place], Dull, Constant. Timing: Constant. Modifying factor(s): Rest and sitting. Vitals:  height is 5' 1 (1.549 m) and weight is 225 lb (102.1 kg). Her temporal temperature is 97.5 F (36.4 C) (abnormal). Her blood pressure is 120/71 and her pulse is 65. Her respiration is 16 and oxygen saturation is 97%.  BMI: Estimated body mass index is 42.51 kg/m as calculated from the following:   Height as of this encounter: 5' 1 (1.549 m).   Weight as of this encounter: 225 lb (102.1 kg).  Last encounter: 01/22/2024. Last procedure: 02/25/2024.  Reason for encounter:  post-procedure evaluation and assessment.   Post-Procedure Evaluation   Type: Lumbar Facet, Medial Branch Block(s) (w/ fluoroscopic mapping) #1  Laterality: Bilateral  Level: L3, L4, and L5 Medial Branch Level(s). Injecting these levels blocks the L3-4 and L4-5 lumbar facet joints.  Imaging: Fluoroscopic guidance Spinal ([id number]) Anesthesia: Local anesthesia (1-2% Lidocaine ) Sedation: Minimal Sedation                       DOS: 02/25/2024 Performed by: Wallie Sherry, MD  Primary Purpose: Diagnostic/Therapeutic Indications: Low back pain severe enough to impact quality of life or function. 1. Lumbar facet arthropathy   2. Lumbar spondylosis   3. Chronic pain syndrome    NAS-11 Pain score:   Pre-procedure: 8 /10   Post-procedure: 0-No pain/10   Patient stopped her Xarelto  3 days prior    Effectiveness:  Initial hour after procedure: 100 %  Subsequent 4-6 hours post-procedure: 100 %  Analgesia past initial 6 hours: 80% for 5 days Ongoing improvement:  Analgesic:  back to baseline   UDS:  Summary  Date Value Ref Range Status  12/25/2023 FINAL  Final    Comment:    ==================================================================== Compliance Drug Analysis, Ur ==================================================================== Test                             Result       Flag       Units  Drug Present and Declared for Prescription Verification   Hydrocodone                     1527  EXPECTED   ng/mg creat   Norhydrocodone                 2034         EXPECTED   ng/mg creat    Sources of hydrocodone  include scheduled prescription medications.    Norhydrocodone is an expected metabolite of hydrocodone .    Gabapentin                      PRESENT      EXPECTED   Bupropion                       PRESENT      EXPECTED   Hydroxybupropion               PRESENT      EXPECTED    Hydroxybupropion is an expected metabolite of bupropion .    Duloxetine                       PRESENT      EXPECTED   Acetaminophen                   PRESENT      EXPECTED   Metoprolol                      PRESENT      EXPECTED  Drug Present not Declared for Prescription Verification   Carboxy-THC                    295          UNEXPECTED ng/mg creat    Carboxy-THC is a metabolite of tetrahydrocannabinol (THC). Source of    THC is most commonly herbal marijuana or marijuana-based products,    but THC is also present in a scheduled prescription medication.    Trace amounts of THC can be present in hemp and cannabidiol (CBD)    products. This test is not intended to distinguish between delta-9-    tetrahydrocannabinol, the predominant form of THC in most herbal or    marijuana-based products, and delta-8-tetrahydrocannabinol.    Dextromethorphan               PRESENT      UNEXPECTED   Dextrorphan/Levorphanol        PRESENT      UNEXPECTED    Dextrorphan is an expected metabolite of dextromethorphan, an over-    the-counter or prescription cough suppressant. Dextrorphan cannot be    distinguished from the scheduled prescription medication levorphanol    by the method used for analysis.  Drug Absent but Declared for Prescription Verification   Clonidine                       Not Detected UNEXPECTED ==================================================================== Test                      Result    Flag   Units      Ref Range   Creatinine              112              mg/dL      >=79 ==================================================================== Declared Medications:  The flagging and interpretation on this report are based on the  following declared medications.  Unexpected results may arise from  inaccuracies in the declared medications.   **Note: The  testing scope of this panel includes these medications:   Bupropion  (Wellbutrin  XL)  Clonidine  (Catapres )  Duloxetine  (Cymbalta )  Gabapentin  (Neurontin )  Hydrocodone  (Norco)  Metoprolol  (Toprol )   **Note: The  testing scope of this panel does not include small to  moderate amounts of these reported medications:   Acetaminophen  (Norco)   **Note: The testing scope of this panel does not include the  following reported medications:   Amiodarone  (Pacerone )  Atorvastatin  (Lipitor )  Azelastine  (Astelin )  Biotin  Clopidogrel  (Plavix )  Doxycycline  (Vibramycin )  Fluoride   Magnesium   Pantoprazole  (Protonix )  Polyethylene Glycol (MiraLAX )  Potassium  Probiotic  Rivaroxaban  (Xarelto )  Supplement  Tirzepatide  (Mounjaro )  Vibegron  (Gemtesa )  Vitamin B  Vitamin C  Vitamin D3  Vitamin E  Vortioxetine  (Trintellix ) ==================================================================== For clinical consultation, please call [phone]. ====================================================================     No results found for: CBDTHCR No results found for: D8THCCBX No results found for: D9THCCBX  ROS  Constitutional: Denies any fever or chills Gastrointestinal: No reported hemesis, hematochezia, vomiting, or acute GI distress Musculoskeletal: +LBP Neurological: No reported episodes of acute onset apraxia, aphasia, dysarthria, agnosia, amnesia, paralysis, loss of coordination, or loss of consciousness  Medication Review  B-complex with vitamin C, Biotin, Black Cohosh, DULoxetine , HYDROcodone -acetaminophen , Lactobacillus-Inulin, Magnesium , Sod Fluoride -Potassium Nitrate , Vibegron , Vitamin D3, Vitamin E, amiodarone , atorvastatin , azelastine , buPROPion , clopidogrel , diclofenac Sodium, gabapentin , glucose blood, lisinopril -hydrochlorothiazide , metoprolol  succinate, metoprolol  tartrate, pantoprazole , rivaroxaban , tirzepatide , traZODone , vitamin C with rose hips, and vortioxetine  HBr  History Review  Allergy: Ms. Caulfield is allergic to penicillins, levofloxacin , oxycodone , veozah  [fezolinetant ], and vilazodone. Drug: Ms. Olsen  reports that she does not currently use drugs after having  used the following drugs: Marijuana. Alcohol:  reports that she does not currently use alcohol. Tobacco:  reports that she quit smoking about 4 years ago. Her smoking use included cigarettes. She has never used smokeless tobacco. Social: Ms. Frate  reports that she quit smoking about 4 years ago. Her smoking use included cigarettes. She has never used smokeless tobacco. She reports that she does not currently use alcohol. She reports that she does not currently use drugs after having used the following drugs: Marijuana. Medical:  has a past medical history of Acute ischemic right MCA stroke (HCC) (08/15/2019), Acute respiratory failure (HCC) (01/16/2021), Anxiety, Aortic atherosclerosis (HCC), Atrial fibrillation (HCC), Avascular necrosis of left femoral head (HCC) (04/09/2018), CAD (coronary artery disease) (03/13/2023), Carotid artery disease (HCC) (08/16/2019), Chronic midline low back pain with right-sided sciatica (02/11/2016), Community acquired pneumonia (01/16/2021), Depression, Essential hypertension, Fibromyalgia, Foraminal stenosis of lumbar region (02/27/2018), GERD (gastroesophageal reflux disease), Long term current use of amiodarone , Lumbar radiculopathy (12/15/2016), Mixed hyperlipidemia, Multifocal pneumonia (06/05/2021), OAB (overactive bladder), On chronic clopidogrel  therapy, On rivaroxaban  therapy, Osteoarthritis of left hip (2019), Restless leg syndrome (02/02/2015), Rotator cuff tendinitis, right (06/04/2023), Sepsis (HCC) (01/16/2021), T2DM (type 2 diabetes mellitus) (HCC), Tendinitis of upper biceps tendon of right shoulder (06/04/2023), and Traumatic complete tear of right rotator cuff (06/04/2023). Surgical: Ms. Landsberg  has a past surgical history that includes Abdominal surgery (1996); Plantar fascia surgery (Right, 2009); Total hip arthroplasty (Left, 04/09/2018); Total knee arthroplasty (Right, 07/12/2021); Gastric bypass open (1996); Tarsal tunnel release (Right, 2009); Total  knee arthroplasty (Left, 02/16/2022); and Reverse shoulder arthroplasty (Right, 08/23/2023). Family: family history includes Aneurysm in her mother; Heart disease in her father.  Laboratory Chemistry Profile   Renal Lab Results  Component Value Date   BUN 20 02/01/2024   CREATININE 1.40 (H) 02/01/2024   BCR 18 01/23/2024   GFRAA >60  08/20/2019   GFRNONAA 44 (L) 02/01/2024    Hepatic Lab Results  Component Value Date   AST 26 01/31/2024   ALT 24 01/31/2024   ALBUMIN 4.0 01/31/2024   ALKPHOS 83 01/31/2024   LIPASE 31 01/31/2024    Electrolytes Lab Results  Component Value Date   NA 136 02/01/2024   K 3.8 02/01/2024   CL 101 02/01/2024   CALCIUM  8.5 (L) 02/01/2024   MG 2.1 06/07/2021   PHOS 4.1 08/20/2019    Bone Lab Results  Component Value Date   VD25OH 45.2 01/23/2024    Inflammation (CRP: Acute Phase) (ESR: Chronic Phase) Lab Results  Component Value Date   CRP <0.8 08/16/2019   ESRSEDRATE 6 08/16/2019   LATICACIDVEN 1.4 06/06/2021         Note: Above Lab results reviewed.  MR LUMBAR SPINE WO CONTRAST CLINICAL DATA:  Low back pain, symptoms persist with > 6 wks treatment. Lumbar radiculopathy, symptoms persist with > 6 wks treatment. Chronic low back pain radiating down the left leg to the knee.   EXAM: MRI LUMBAR SPINE WITHOUT CONTRAST   TECHNIQUE: Multiplanar, multisequence MR imaging of the lumbar spine was performed. No intravenous contrast was administered.   COMPARISON:  Lumbar spine MRI 04/20/2017   FINDINGS: Segmentation:  Standard.   Alignment:  Mild lumbar dextroscoliosis.  No significant listhesis.   Vertebrae: No fracture or suspicious marrow lesion. Mild Modic type 1 and 2 degenerative endplate changes at L4-5 eccentric to the right.   Conus medullaris and cauda equina: Conus extends to the upper L1 level. Conus and cauda equina appear normal.   Paraspinal and other soft tissues: Unremarkable.   Disc levels:   Disc desiccation  throughout the lumbar spine with exception of L5-S1. Asymmetrically advanced disc space narrowing on the left at L2-3 and on the right at L4-5.   T12-L1: Mild facet and ligamentum flavum hypertrophy without disc herniation or stenosis, unchanged.   L1-2: Circumferential disc bulging mildly eccentric to the right and mild to moderate facet and ligamentum flavum hypertrophy result in mild left lateral recess stenosis and mild bilateral neural foraminal stenosis, slightly progressed. No spinal stenosis.   L2-3: Left eccentric disc bulging, endplate spurring, disc space height loss, and moderate facet and ligamentum flavum hypertrophy result in mild spinal stenosis, mild left lateral recess stenosis, and moderate left neural foraminal stenosis, mildly progressed.   L3-4: Disc bulging and moderate right and severe left facet and ligamentum flavum hypertrophy result in mild spinal stenosis and mild left greater than right neural foraminal stenosis, mildly progressed.   L4-5: Right eccentric disc bulging, endplate spurring, disc space height loss, and moderate right and mild left facet and ligamentum flavum hypertrophy result in severe right neural foraminal stenosis without spinal stenosis, unchanged.   L5-S1: Minimal rightward disc bulging, endplate spurring, and moderate to severe right and mild left facet hypertrophy result in mild-to-moderate right neural foraminal stenosis, stable to slightly progressed. No spinal stenosis.   IMPRESSION: 1. Mild progression of multilevel lumbar disc and facet degeneration since 2018. 2. Mild spinal stenosis and moderate left neural foraminal stenosis at L2-3. 3. Mild spinal stenosis and mild bilateral neural foraminal stenosis at L3-4. 4. Severe right neural foraminal stenosis at L4-5.   Electronically Signed   By: Dasie Hamburg M.D.   On: 01/21/2024 10:29 Note: Reviewed       Note: Reviewed        Physical Exam  General appearance: Well  nourished, well developed, and  well hydrated. In no apparent acute distress Mental status: Alert, oriented x 3 (person, place, & time)       Respiratory: No evidence of acute respiratory distress Eyes: PERLA Vitals: BP 120/71 (Patient Position: Sitting, Cuff Size: Normal)   Pulse 65   Temp (!) 97.5 F (36.4 C) (Temporal)   Resp 16   Ht 5' 1 (1.549 m)   Wt 225 lb (102.1 kg)   LMP 04/01/2018 (Exact Date)   SpO2 97%   BMI 42.51 kg/m  BMI: Estimated body mass index is 42.51 kg/m as calculated from the following:   Height as of this encounter: 5' 1 (1.549 m).   Weight as of this encounter: 225 lb (102.1 kg). Ideal: Ideal body weight: 47.8 kg (105 lb 6.1 oz) Adjusted ideal body weight: 69.5 kg (153 lb 3.6 oz)  Lumbar Spine Area Exam  Skin & Axial Inspection: No masses, redness, or swelling Alignment: Symmetrical Functional ROM: Pain restricted ROM       Stability: No instability detected Muscle Tone/Strength: Functionally intact. No obvious neuro-muscular anomalies detected. Sensory (Neurological): Musculoskeletal pain pattern, facet mediated Palpation: Complains of area being tender to palpation     Pain with lumbar extension and facet loading   Gait & Posture Assessment  Ambulation: Unassisted Gait: Relatively normal for age and body habitus Posture: WNL  Lower Extremity Exam      Side: Right lower extremity   Side: Left lower extremity  Stability: No instability observed           Stability: No instability observed          Skin & Extremity Inspection: Evidence of prior arthroplastic surgery   Skin & Extremity Inspection: Evidence of prior arthroplastic surgery  Functional ROM: Unrestricted ROM                   Functional ROM: Unrestricted ROM                  Muscle Tone/Strength: Functionally intact. No obvious neuro-muscular anomalies detected.   Muscle Tone/Strength: Functionally intact. No obvious neuro-muscular anomalies detected.  Sensory (Neurological): Unimpaired          Sensory (Neurological): Unimpaired        DTR: Patellar: deferred today Achilles: deferred today Plantar: deferred today   DTR: Patellar: deferred today Achilles: deferred today Plantar: deferred today  Palpation: No palpable anomalies   Palpation: No palpable anomalies     Assessment   Diagnosis Status  1. Lumbar facet arthropathy   2. Lumbar spondylosis   3. Chronic pain syndrome    Responding Responding Controlled   Updated Problems: No problems updated.  Plan of Care  Problem-specific:  Assessment and Plan    Facet joint arthritis   Facet joint arthritis causes intermittent pain, worsened by walking. A previous nerve block provided short-term relief, justifying a second nerve block followed by radiofrequency ablation for long-term relief. Insurance requires two nerve blocks before ablation. Schedule a second nerve block for insurance documentation. Plan radiofrequency ablation after the second nerve block. Administer oral Valium  for sedation during procedures. Discontinue Xarelto  three days before procedures.  Sciatica   Sciatica presents with intermittent pain, with most relief achieved after a previous nerve block. Pain is not constant and primarily occurs with increased activity.       Ms. Lavinia J Strick has a current medication list which includes the following long-term medication(s): amiodarone , atorvastatin , azelastine , bupropion , duloxetine , gabapentin , lisinopril -hydrochlorothiazide , metoprolol  succinate, metoprolol  tartrate, pantoprazole , rivaroxaban , and trazodone .  Pharmacotherapy (Medications Ordered): No orders of the defined types were placed in this encounter.  Orders:  Orders Placed This Encounter  Procedures   LUMBAR FACET(MEDIAL BRANCH NERVE BLOCK) MBNB    Diagnosis: Lumbar Facet Syndrome (M47.816); Lumbosacral Facet Syndrome (M47.817); Lumbar Facet Joint Pain (M54.59) Medical Necessity Statement: 1.Severe chronic axial low back pain  causing functional impairment documented by ongoing pain scale assessments. 2.Pain present for longer than 3 months (Chronic) documented to have failed noninvasive conservative therapies. 3.Absence of untreated radiculopathy. 4.There is no radiological evidence of untreated fractures, tumor, infection, or deformity.  Physical Examination Findings: Positive Kemp Maneuver: (Y)  Positive Lumbar Hyperextension-Rotation provocative test: (Y)    Standing Status:   Future    Expiration Date:   07/09/2024    Scheduling Instructions:     Type: Lumbar Facet, Medial Branch Block(s) (w/ fluoroscopic mapping) #2     Laterality: Bilateral      Level: L3, L4, and L5 Medial Branch Level(s). Injecting these levels blocks the L3-4 and L4-5 lumbar facet joints.           Imaging: Fluoroscopic guidance Spinal ([id number])     Anesthesia: Local anesthesia (1-2% Lidocaine )     Sedation: IV Versed                           Performed by: Wallie Sherry, MD    Where will this procedure be performed?:   [facility]     BLF L3-5 02/25/24    Return in about 8 days (around 04/16/2024).    Recent Visits Date Type Provider Dept  02/25/24 Procedure visit Sherry Wallie, MD [facility]  01/22/24 Office Visit Sherry Wallie, MD [facility]  Showing recent visits within past 90 days and meeting all other requirements Today's Visits Date Type Provider Dept  04/08/24 Office Visit Sherry Wallie, MD [facility]  Showing today's visits and meeting all other requirements Future Appointments Date Type Provider Dept  04/16/24 Appointment Sherry Wallie, MD [facility]  Showing future appointments within next 90 days and meeting all other requirements  I discussed the assessment and treatment plan with the patient. The patient was provided an opportunity to ask questions and all were answered. The patient agreed with the plan and demonstrated an understanding of the  instructions.  Patient advised to call back or seek an in-person evaluation if the symptoms or condition worsens.  Duration of encounter: .  Total time on encounter, as per AMA guidelines included both the face-to-face and non-face-to-face time personally spent by the physician and/or other qualified health care professional(s) on the day of the encounter (includes time in activities that require the physician or other qualified health care professional and does not include time in activities normally performed by clinical staff). Physician's time may include the following activities when performed: Preparing to see the patient (e.g., pre-charting review of records, searching for previously ordered imaging, lab work, and nerve conduction tests) Review of prior analgesic pharmacotherapies. Reviewing PMP Interpreting ordered tests (e.g., lab work, imaging, nerve conduction tests) Performing post-procedure evaluations, including interpretation of diagnostic procedures Obtaining and/or reviewing separately obtained history Performing a medically appropriate examination and/or evaluation Counseling and educating the patient/family/caregiver Ordering medications, tests, or procedures Referring and communicating with other health care professionals (when not separately reported) Documenting clinical information in the electronic or other health record Independently interpreting results (not separately reported) and communicating results to the patient/ family/caregiver Care coordination (not  separately reported)  Note by: Wallie Sherry, MD (TTS and AI technology used. I apologize for any typographical errors that were not detected and corrected.) Date: 04/08/2024; Time: 10:03 AM

## 2024-04-09 ENCOUNTER — Other Ambulatory Visit

## 2024-04-10 ENCOUNTER — Ambulatory Visit
Admission: RE | Admit: 2024-04-10 | Discharge: 2024-04-10 | Disposition: A | Discharge status: Home / Self Care | Source: Ambulatory Visit | Attending: Family | Admitting: Family

## 2024-04-10 DIAGNOSIS — Z1231 Encounter for screening mammogram for malignant neoplasm of breast: Secondary | ICD-10-CM | POA: Diagnosis not present

## 2024-04-14 ENCOUNTER — Ambulatory Visit: Payer: Self-pay | Admitting: Family

## 2024-04-16 ENCOUNTER — Encounter: Payer: Self-pay | Admitting: Student in an Organized Health Care Education/Training Program

## 2024-04-16 ENCOUNTER — Ambulatory Visit
Admission: RE | Admit: 2024-04-16 | Discharge: 2024-04-16 | Disposition: A | Discharge status: Home / Self Care | Source: Ambulatory Visit | Attending: Student in an Organized Health Care Education/Training Program | Admitting: Student in an Organized Health Care Education/Training Program

## 2024-04-16 ENCOUNTER — Ambulatory Visit (HOSPITAL_BASED_OUTPATIENT_CLINIC_OR_DEPARTMENT_OTHER): Admitting: Student in an Organized Health Care Education/Training Program

## 2024-04-16 VITALS — BP 118/60 | HR 72 | Temp 96.8°F | Resp 20 | Ht 61.0 in | Wt 230.0 lb

## 2024-04-16 DIAGNOSIS — M47816 Spondylosis without myelopathy or radiculopathy, lumbar region: Secondary | ICD-10-CM | POA: Insufficient documentation

## 2024-04-16 DIAGNOSIS — G894 Chronic pain syndrome: Secondary | ICD-10-CM | POA: Diagnosis not present

## 2024-04-16 MED ORDER — LIDOCAINE HCL 2 % IJ SOLN
20.0000 mL | Freq: Once | INTRAMUSCULAR | Status: AC
  Administered 2024-04-16: 400 mg

## 2024-04-16 MED ORDER — ROPIVACAINE HCL 2 MG/ML IJ SOLN
18.0000 mL | Freq: Once | INTRAMUSCULAR | Status: AC
  Administered 2024-04-16: 18 mL via PERINEURAL

## 2024-04-16 MED ORDER — MIDAZOLAM HCL 2 MG/2ML IJ SOLN
0.5000 mg | Freq: Once | INTRAMUSCULAR | Status: AC
  Administered 2024-04-16: 2 mg via INTRAVENOUS

## 2024-04-16 MED ORDER — DEXAMETHASONE SODIUM PHOSPHATE 10 MG/ML IJ SOLN
20.0000 mg | Freq: Once | INTRAMUSCULAR | Status: AC
  Administered 2024-04-16: 20 mg

## 2024-04-16 MED ORDER — MIDAZOLAM HCL 2 MG/2ML IJ SOLN
INTRAMUSCULAR | Status: AC
  Filled 2024-04-16: qty 2

## 2024-04-16 MED ORDER — LIDOCAINE HCL 2 % IJ SOLN
INTRAMUSCULAR | Status: AC
Start: 2024-04-16 — End: 2024-04-16
  Filled 2024-04-16: qty 20

## 2024-04-16 MED ORDER — LACTATED RINGERS IV SOLN: Freq: Once | INTRAVENOUS | Status: AC

## 2024-04-16 MED ORDER — ROPIVACAINE HCL 2 MG/ML IJ SOLN
INTRAMUSCULAR | Status: AC
  Filled 2024-04-16: qty 20

## 2024-04-16 MED ORDER — DEXAMETHASONE SODIUM PHOSPHATE 10 MG/ML IJ SOLN
INTRAMUSCULAR | Status: AC
  Filled 2024-04-16: qty 2

## 2024-04-16 NOTE — Progress Notes (Signed)
 PROVIDER NOTE: Interpretation of information contained herein should be left to medically-trained personnel. Specific patient instructions are provided elsewhere under Patient Instructions section of medical record. This document was created in part using STT-dictation technology, any transcriptional errors that may result from this process are unintentional.  Patient: Terri Wood Type: Established DOB: [birth year] MRN: [id number] PCP: Orlean Alan HERO, FNP  Service: Procedure DOS: 04/16/2024 Setting: Ambulatory Location: Ambulatory outpatient facility Delivery: Face-to-face Provider: Wallie Sherry, MD Specialty: Interventional Pain Management Specialty designation: 09 Location: Outpatient facility Ref. Prov.: Orlean Alan HERO, FNP       Interventional Therapy   Type: Lumbar Facet, Medial Branch Block(s) (w/ fluoroscopic mapping) #2  Laterality: Bilateral  Level: L3, L4, and L5 Medial Branch Level(s). Injecting these levels blocks the L3-4 and L4-5 lumbar facet joints.  Imaging: Fluoroscopic guidance Spinal ([id number]) Anesthesia: Local anesthesia (1-2% Lidocaine ) Sedation: Minimal Sedation                       DOS: 04/16/2024 Performed by: Wallie Sherry, MD  Primary Purpose: Diagnostic/Therapeutic Indications: Low back pain severe enough to impact quality of life or function. 1. Lumbar facet arthropathy   2. Lumbar spondylosis   3. Chronic pain syndrome     NAS-11 Pain score:   Pre-procedure: 8 /10   Post-procedure: 0-No pain/10     Position / Prep / Materials:  Position: Prone  Prep solution: ChloraPrep (2% chlorhexidine  gluconate and 70% isopropyl alcohol) Area Prepped: Posterolateral Lumbosacral Spine (Wide prep: From the lower border of the scapula down to the end of the tailbone and from flank to flank.)  Materials:  Tray: Block Needle(s):  Type: Spinal  Gauge (G): 22  Length: 5-in Qty: 2     H&P (Pre-op Assessment):  Ms. Weightman is a 58 y.o. (year old),  female patient, seen today for interventional treatment. She  has a past surgical history that includes Abdominal surgery (1996); Plantar fascia surgery (Right, 2009); Total hip arthroplasty (Left, 04/09/2018); Total knee arthroplasty (Right, 07/12/2021); Gastric bypass open (1996); Tarsal tunnel release (Right, 2009); Total knee arthroplasty (Left, 02/16/2022); and Reverse shoulder arthroplasty (Right, 08/23/2023). Ms. Dlouhy has a current medication list which includes the following prescription(s): accu-chek guide, amiodarone , vitamin c with rose hips, atorvastatin , azelastine , b-complex with vitamin c, biotin, black cohosh, bupropion , vitamin d3, clopidogrel , diclofenac sodium, duloxetine , gabapentin , gemtesa , hydrocodone -acetaminophen , lactobacillus-inulin, lisinopril -hydrochlorothiazide , magnesium , metoprolol  succinate, metoprolol  tartrate, pantoprazole , sod fluoride -potassium nitrate , mounjaro , trazodone , vitamin e, vortioxetine  hbr, and rivaroxaban , and the following Facility-Administered Medications: lactated ringers . Her primarily concern today is the Back Pain  Initial Vital Signs:  Pulse/HCG Rate: 72ECG Heart Rate: 78 Temp: (!) 96.8 F (36 C) Resp: 16 BP: 106/83 SpO2: 97 %  BMI: Estimated body mass index is 43.46 kg/m as calculated from the following:   Height as of this encounter: 5' 1 (1.549 m).   Weight as of this encounter: 230 lb (104.3 kg).  Risk Assessment: Allergies: Reviewed. She is allergic to penicillins, levofloxacin , oxycodone , veozah  [fezolinetant ], and vilazodone.  Allergy Precautions: None required Coagulopathies: Reviewed. None identified.  Blood-thinner therapy: None at this time Active Infection(s): Reviewed. None identified. Ms. Akre is afebrile  Site Confirmation: Ms. Cronin was asked to confirm the procedure and laterality before marking the site Procedure checklist: Completed Consent: Before the procedure and under the influence of no sedative(s),  amnesic(s), or anxiolytics, the patient was informed of the treatment options, risks and possible complications. To fulfill our ethical and legal obligations, as recommended by the  American Medical Association's Code of Ethics, I have informed the patient of my clinical impression; the nature and purpose of the treatment or procedure; the risks, benefits, and possible complications of the intervention; the alternatives, including doing nothing; the risk(s) and benefit(s) of the alternative treatment(s) or procedure(s); and the risk(s) and benefit(s) of doing nothing. The patient was provided information about the general risks and possible complications associated with the procedure. These may include, but are not limited to: failure to achieve desired goals, infection, bleeding, organ or nerve damage, allergic reactions, paralysis, and death. In addition, the patient was informed of those risks and complications associated to Spine-related procedures, such as failure to decrease pain; infection (i.e.: Meningitis, epidural or intraspinal abscess); bleeding (i.e.: epidural hematoma, subarachnoid hemorrhage, or any other type of intraspinal or peri-dural bleeding); organ or nerve damage (i.e.: Any type of peripheral nerve, nerve root, or spinal cord injury) with subsequent damage to sensory, motor, and/or autonomic systems, resulting in permanent pain, numbness, and/or weakness of one or several areas of the body; allergic reactions; (i.e.: anaphylactic reaction); and/or death. Furthermore, the patient was informed of those risks and complications associated with the medications. These include, but are not limited to: allergic reactions (i.e.: anaphylactic or anaphylactoid reaction(s)); adrenal axis suppression; blood sugar elevation that in diabetics may result in ketoacidosis or comma; water retention that in patients with history of congestive heart failure may result in shortness of breath, pulmonary edema, and  decompensation with resultant heart failure; weight gain; swelling or edema; medication-induced neural toxicity; particulate matter embolism and blood vessel occlusion with resultant organ, and/or nervous system infarction; and/or aseptic necrosis of one or more joints. Finally, the patient was informed that Medicine is not an exact science; therefore, there is also the possibility of unforeseen or unpredictable risks and/or possible complications that may result in a catastrophic outcome. The patient indicated having understood very clearly. We have given the patient no guarantees and we have made no promises. Enough time was given to the patient to ask questions, all of which were answered to the patient's satisfaction. Ms. Hodsdon has indicated that she wanted to continue with the procedure. Attestation: I, the ordering provider, attest that I have discussed with the patient the benefits, risks, side-effects, alternatives, likelihood of achieving goals, and potential problems during recovery for the procedure that I have provided informed consent. Date  Time: 04/16/2024 11:00 AM  Pre-Procedure Preparation:  Monitoring: As per clinic protocol. Respiration, ETCO2, SpO2, BP, heart rate and rhythm monitor placed and checked for adequate function Safety Precautions: Patient was assessed for positional comfort and pressure points before starting the procedure. Time-out: I initiated and conducted the Time-out before starting the procedure, as per protocol. The patient was asked to participate by confirming the accuracy of the Time Out information. Verification of the correct person, site, and procedure were performed and confirmed by me, the nursing staff, and the patient. Time-out conducted as per Joint Commission's Universal Protocol (UP.01.01.01). Time: 1131 Start Time: 1131 hrs.  Description of Procedure:          Laterality: (see above) Targeted Levels: (see above)  Safety Precautions:  Aspiration looking for blood return was conducted prior to all injections. At no point did we inject any substances, as a needle was being advanced. Before injecting, the patient was told to immediately notify me if she was experiencing any new onset of ringing in the ears, or metallic taste in the mouth. No attempts were made at seeking any paresthesias. Safe  injection practices and needle disposal techniques used. Medications properly checked for expiration dates. SDV (single dose vial) medications used. After the completion of the procedure, all disposable equipment used was discarded in the proper designated medical waste containers. Local Anesthesia: Protocol guidelines were followed. The patient was positioned over the fluoroscopy table. The area was prepped in the usual manner. The time-out was completed. The target area was identified using fluoroscopy. A 12-in long, straight, sterile hemostat was used with fluoroscopic guidance to locate the targets for each level blocked. Once located, the skin was marked with an approved surgical skin marker. Once all sites were marked, the skin (epidermis, dermis, and hypodermis), as well as deeper tissues (fat, connective tissue and muscle) were infiltrated with a small amount of a short-acting local anesthetic, loaded on a 10cc syringe with a 25G, 1.5-in  Needle. An appropriate amount of time was allowed for local anesthetics to take effect before proceeding to the next step. Local Anesthetic: Lidocaine  2.0% The unused portion of the local anesthetic was discarded in the proper designated containers. Technical description of process:   L3 Medial Branch Nerve Block (MBB): The target area for the L3 medial branch is at the junction of the postero-lateral aspect of the superior articular process and the superior, posterior, and medial edge of the transverse process of L4. Under fluoroscopic guidance, a Quincke needle was inserted until contact was made with os over  the superior postero-lateral aspect of the pedicular shadow (target area). After negative aspiration for blood, 2mL of the nerve block solution was injected without difficulty or complication. The needle was removed intact. L4 Medial Branch Nerve Block (MBB): The target area for the L4 medial branch is at the junction of the postero-lateral aspect of the superior articular process and the superior, posterior, and medial edge of the transverse process of L5. Under fluoroscopic guidance, a Quincke needle was inserted until contact was made with os over the superior postero-lateral aspect of the pedicular shadow (target area). After negative aspiration for blood, 2mL of the nerve block solution was injected without difficulty or complication. The needle was removed intact. L5 Medial Branch Nerve Block (MBB): The target area for the L5 medial branch is at the junction of the postero-lateral aspect of the superior articular process and the superior, posterior, and medial edge of the sacral ala. Under fluoroscopic guidance, a Quincke needle was inserted until contact was made with os over the superior postero-lateral aspect of the pedicular shadow (target area). After negative aspiration for blood, 2mL of the nerve block solution was injected without difficulty or complication. The needle was removed intact.   Once the entire procedure was completed, the treated area was cleaned, making sure to leave some of the prepping solution back to take advantage of its long term bactericidal properties.         Illustration of the posterior view of the lumbar spine and the posterior neural structures. Laminae of L2 through S1 are labeled. DPRL5, dorsal primary ramus of L5; DPRS1, dorsal primary ramus of S1; DPR3, dorsal primary ramus of L3; FJ, facet (zygapophyseal) joint L3-L4; I, inferior articular process of L4; LB1, lateral branch of dorsal primary ramus of L1; IAB, inferior articular branches from L3 medial branch  (supplies L4-L5 facet joint); IBP, intermediate branch plexus; MB3, medial branch of dorsal primary ramus of L3; NR3, third lumbar nerve root; S, superior articular process of L5; SAB, superior articular branches from L4 (supplies L4-5 facet joint also); TP3, transverse process of L3.  Facet Joint Innervation (* possible contribution)  L1-2 T12, L1 (L2*)  Medial Branch  L2-3 L1, L2 (L3*)                     L3-4 L2, L3 (L4*)                     L4-5 L3, L4 (L5*)                     L5-S1 L4, L5, S1                        Vitals:   04/16/24 1130 04/16/24 1135 04/16/24 1140 04/16/24 1144  BP: 115/72 107/70 108/73 118/60  Pulse:      Resp: (!) 23 15 (!) 22 20  Temp:      SpO2: 96% 98% 99% 99%  Weight:      Height:         End Time: 1138 hrs.  Imaging Guidance (Spinal):          Type of Imaging Technique: Fluoroscopy Guidance (Spinal) Indication(s): Fluoroscopy guidance for needle placement to enhance accuracy in procedures requiring precise needle localization for targeted delivery of medication in or near specific anatomical locations not easily accessible without such real-time imaging assistance. Exposure Time: Please see nurses notes. Contrast: None used. Fluoroscopic Guidance: I was personally present during the use of fluoroscopy. Tunnel Vision Technique used to obtain the best possible view of the target area. Parallax error corrected before commencing the procedure. Direction-depth-direction technique used to introduce the needle under continuous pulsed fluoroscopy. Once target was reached, antero-posterior, oblique, and lateral fluoroscopic projection used confirm needle placement in all planes. Images permanently stored in EMR. Interpretation: No contrast injected. I personally interpreted the imaging intraoperatively. Adequate needle placement confirmed in multiple planes. Permanent images saved into the patient's record.  Post-operative Assessment:   Post-procedure Vital Signs:  Pulse/HCG Rate: 7273 Temp: (!) 96.8 F (36 C) Resp: 20 BP: 118/60 SpO2: 99 %  EBL: None  Complications: No immediate post-treatment complications observed by team, or reported by patient.  Note: The patient tolerated the entire procedure well. A repeat set of vitals were taken after the procedure and the patient was kept under observation following institutional policy, for this type of procedure. Post-procedural neurological assessment was performed, showing return to baseline, prior to discharge. The patient was provided with post-procedure discharge instructions, including a section on how to identify potential problems. Should any problems arise concerning this procedure, the patient was given instructions to immediately contact us , at any time, without hesitation. In any case, we plan to contact the patient by telephone for a follow-up status report regarding this interventional procedure.  Comments:  No additional relevant information.  Plan of Care (POC)  Orders:  Orders Placed This Encounter  Procedures   DG PAIN CL[facility] C-ARM 1-60 MIN NO REPORT    Intraoperative interpretation by procedural physician at [facility].    Standing Status:   Standing    Number of Occurrences:   1    Reason for exam::   Assistance in needle guidance and placement for procedures requiring needle placement in or near specific anatomical locations not easily accessible without such assistance.    Medications ordered for procedure: Meds ordered this encounter  Medications   lidocaine  (XYLOCAINE ) 2 % (with pres) injection 400 mg   dexamethasone  (DECADRON ) injection 20 mg   ropivacaine  (PF)  2 mg/mL (0.2%) (NAROPIN ) injection 18 mL   lactated ringers  infusion   midazolam  (VERSED ) injection 0.5-2 mg    Make sure Flumazenil is available in the pyxis when using this medication. If oversedation occurs, administer 0.2 mg IV over 15 sec. If after 45 sec no response,  administer 0.2 mg again over 1 min; may repeat at 1 min intervals; not to exceed 4 doses (1 mg)   Medications administered: We administered lidocaine , dexamethasone , ropivacaine  (PF) 2 mg/mL (0.2%), lactated ringers , and midazolam .  See the medical record for exact dosing, route, and time of administration.  Follow-up plan:   Return in about 3 weeks (around 05/07/2024) for PPE, VV (discuss RFA).       BLF L3-5 02/25/24, 04/16/24    Recent Visits Date Type Provider Dept  04/08/24 Office Visit Marcelino Nurse, MD [facility]  02/25/24 Procedure visit Marcelino Nurse, MD [facility]  01/22/24 Office Visit Marcelino Nurse, MD [facility]  Showing recent visits within past 90 days and meeting all other requirements Today's Visits Date Type Provider Dept  04/16/24 Procedure visit Marcelino Nurse, MD [facility]  Showing today's visits and meeting all other requirements Future Appointments Date Type Provider Dept  05/07/24 Appointment Marcelino Nurse, MD [facility]  Showing future appointments within next 90 days and meeting all other requirements  Disposition: Discharge home  Discharge (Date  Time): 04/16/2024; 1150 hrs.   Primary Care Physician: Orlean Alan HERO, FNP Location: [facility] Note by: Nurse Marcelino, MD (TTS technology used. I apologize for any typographical errors that were not detected and corrected.) Date: 04/16/2024; Time: 11:51 AM  Disclaimer:  Medicine is not an Visual merchandiser. The only guarantee in medicine is that nothing is guaranteed. It is important to note that the decision to proceed with this intervention was based on the information collected from the patient. The Data and conclusions were drawn from the patient's questionnaire, the interview, and the physical examination. Because the information was provided in large part by the patient, it cannot be guaranteed that it has not been purposely or  unconsciously manipulated. Every effort has been made to obtain as much relevant data as possible for this evaluation. It is important to note that the conclusions that lead to this procedure are derived in large part from the available data. Always take into account that the treatment will also be dependent on availability of resources and existing treatment guidelines, considered by other Pain Management Practitioners as being common knowledge and practice, at the time of the intervention. For Medico-Legal purposes, it is also important to point out that variation in procedural techniques and pharmacological choices are the acceptable norm. The indications, contraindications, technique, and results of the above procedure should only be interpreted and judged by a Board-Certified Interventional Pain Specialist with extensive familiarity and expertise in the same exact procedure and technique.

## 2024-04-16 NOTE — Patient Instructions (Signed)

## 2024-04-16 NOTE — Progress Notes (Signed)
 Safety precautions to be maintained throughout the outpatient stay will include: orient to surroundings, keep bed in low position, maintain call bell within reach at all times, provide assistance with transfer out of bed and ambulation.

## 2024-04-17 ENCOUNTER — Telehealth: Payer: Self-pay | Admitting: *Deleted

## 2024-04-17 NOTE — Telephone Encounter (Signed)
 Post procedure call; patient states that she had a headache on yesterday,  she had BLF and educated that this should not have caused a headache.  She took ibuprofen for that and is relieved. She also reports her face being flushed and feels that it is the steroid as this happened before, just a little uncomfortable being red and warm to the touch and reports that is embarrassing to her.  Said that this usually last about 1 week. She states that she is not sure if the injections are worth having at this point if she does not get any more benefit vs reactions that she is having.   I told patient that I would let Dr Marcelino know this info and if there is anything that he would suggest I would let her know, otherwise we will see her at next appt.

## 2024-04-20 ENCOUNTER — Encounter: Payer: Self-pay | Admitting: Family

## 2024-04-20 NOTE — Assessment & Plan Note (Signed)
 Patient stable.  Well controlled with current therapy.   Continue current meds.

## 2024-04-20 NOTE — Assessment & Plan Note (Signed)
 Checking labs today.  Continue current therapy for lipid control. Will modify as needed based on labwork results.   -CMP w/eGFR -Lipid Panel

## 2024-04-20 NOTE — Assessment & Plan Note (Signed)
 Sending referral to provider as requested by pt.  Continue current meds.  Will adjust as needed based on results.  The patient is asked to make an attempt to improve diet and exercise patterns to aid in medical management of this problem. Addressed importance of increasing and maintaining water intake.

## 2024-04-20 NOTE — Assessment & Plan Note (Signed)
 Blood pressure well controlled with current medications.  Continue current therapy.  Will reassess at follow up.   - CBC w/Diff - CMP w/eGFR

## 2024-04-20 NOTE — Assessment & Plan Note (Signed)
 Checking labs today. Will call pt. With results  Continue current diabetes POC, as patient has been well controlled on current regimen.  Will adjust meds if needed based on labs.   -CBC w/Diff -CMP w/eGFR -Hemoglobin A1C

## 2024-04-20 NOTE — Progress Notes (Signed)
 Established Patient Office Visit  Subjective:  Patient ID: Terri Wood, female    DOB: September 13, 1966  Age: 58 y.o. MRN: [id number]  Chief Complaint  Patient presents with   Follow-up    3 month follow up    Patient is here today for her 3 months follow up.  She has been feeling fairly well since last appointment.   She does have additional concerns to discuss today.  She asks if we can refer her to a particular provider for a revision of her bariatric procedure in [facility].   She is also having more depression/anxiety, asks if there is anything we can try for this.   Labs are due today. She needs refills.   I have reviewed her active problem list, medication list, allergies, health maintenance, notes from last encounter, lab results for her appointment today.      No other concerns at this time.   Past Medical History:  Diagnosis Date   Acute ischemic right MCA stroke (HCC) 08/15/2019   Acute respiratory failure (HCC) 01/16/2021   a.) in setting of CAP   Anxiety    Aortic atherosclerosis (HCC)    Atrial fibrillation (HCC)    a.) CHA2DS2-VASc = 5 (sex, HTN, CVA x2, vascular disease history) as of 08/20/2023; b.) cardiac rate/rhythm maintained on oral amiodarone  + metoprolol ; chronically anticoagulated using rivaroxaban ; on chronic clopidogrel    Avascular necrosis of left femoral head (HCC) 04/09/2018   CAD (coronary artery disease) 03/13/2023   a.) MV 03/13/2023: small mild reversible bas/mid anterosep/inferosep/apex defects c/w isch; b.) cCTA 03/29/2023: Ca2+ = 673.3 (99th %'ile)   Carotid artery disease (HCC) 08/16/2019   a.) doppler 08/16/2019: 40-59% LICA   Chronic midline low back pain with right-sided sciatica 02/11/2016   Community acquired pneumonia 01/16/2021   Depression    Essential hypertension    Fibromyalgia    Foraminal stenosis of lumbar region 02/27/2018   GERD (gastroesophageal reflux disease)    Long term current use of amiodarone     Lumbar  radiculopathy 12/15/2016   Mixed hyperlipidemia    Multifocal pneumonia 06/05/2021   OAB (overactive bladder)    On chronic clopidogrel  therapy    On rivaroxaban  therapy    Osteoarthritis of left hip 2019   Restless leg syndrome 02/02/2015   Rotator cuff tendinitis, right 06/04/2023   Sepsis (HCC) 01/16/2021   T2DM (type 2 diabetes mellitus) (HCC)    Tendinitis of upper biceps tendon of right shoulder 06/04/2023   Traumatic complete tear of right rotator cuff 06/04/2023    Past Surgical History:  Procedure Laterality Date   ABDOMINAL SURGERY  1996   gastric bypass; stapling; surgilite   GASTRIC BYPASS OPEN  1996   PLANTAR FASCIA SURGERY Right 2009   REVERSE SHOULDER ARTHROPLASTY Right 08/23/2023   Procedure: REVERSE SHOULDER ARTHROPLASTY WITH BICEPS TENODESIS;  Surgeon: Edie Norleen JINNY, MD;  Location: [facility] ORS;  Service: Orthopedics;  Laterality: Right;   TARSAL TUNNEL RELEASE Right 2009   TOTAL HIP ARTHROPLASTY Left 04/09/2018   Procedure: TOTAL HIP ARTHROPLASTY ANTERIOR APPROACH;  Surgeon: Kathlynn Sharper, MD;  Location: [facility] OR[facility];  Service: Orthopedics;  Laterality: Left;   TOTAL KNEE ARTHROPLASTY Right 07/12/2021   Procedure: TOTAL KNEE ARTHROPLASTY;  Surgeon: Kathlynn Sharper, MD;  Location: [facility] OR[facility];  Service: Orthopedics;  Laterality: Right;   TOTAL KNEE ARTHROPLASTY Left 02/16/2022   Procedure: TOTAL KNEE ARTHROPLASTY;  Surgeon: Kathlynn Sharper, MD;  Location: [facility] OR[facility];  Service: Orthopedics;  Laterality: Left;    Social History  Socioeconomic History   Marital status: Legally Separated    Spouse name: Not on file   Number of children: 0   Years of education: Not on file   Highest education level: Not on file  Occupational History   Not on file  Tobacco Use   Smoking status: Former    Current packs/day: 0.00    Types: Cigarettes    Quit date: 07/2019    Years since quitting: 4.7   Smokeless tobacco: Never  Vaping Use   Vaping status: Never Used  Substance and Sexual  Activity   Alcohol use: Not Currently   Drug use: Not Currently    Types: Marijuana    Comment: none since 2019   Sexual activity: Not Currently  Other Topics Concern   Not on file  Social History Narrative   Lives with mother   Social Drivers of Health   Financial Resource Strain: Low Risk  (01/16/2022)   Received from [facility]   Overall Financial Resource Strain (CARDIA)    Difficulty of Paying Living Expenses: Not very hard  Food Insecurity: Unknown (01/16/2022)   Received from [facility]   Hunger Vital Sign    Within the past 12 months, you worried that your food would run out before you got the money to buy more.: Never true    Ran Out of Food in the Last Year: Not on file  Transportation Needs: No Transportation Needs (01/16/2022)   Received from [facility] - Transportation    In the past 12 months, has lack of transportation kept you from medical appointments or from getting medications?: No    Lack of Transportation (Non-Medical): No  Physical Activity: Inactive (01/16/2022)   Received from [facility]   Exercise Vital Sign    On average, how many days per week do you engage in moderate to strenuous exercise (like a brisk walk)?: 0 days    On average, how many minutes do you engage in exercise at this level?: 0 min  Stress: No Stress Concern Present (01/16/2022)   Received from [facility] - Occupational Stress Questionnaire    Feeling of Stress : Not at all  Social Connections: Moderately Integrated (01/16/2022)   Received from [facility]   Social Connection and Isolation Panel    In a typical week, how many times do you talk on the phone with family, friends, or neighbors?: More than three times a week    How often do you get together with friends or relatives?: Never    How often do you attend church or religious  services?: More than 4 times per year    Do you belong to any clubs or organizations such as church groups, unions, fraternal or athletic groups, or school groups?: Yes    How often do you attend meetings of the clubs or organizations you belong to?: More than 4 times per year    Are you married, widowed, divorced, separated, never married, or living with a partner?: Separated  Intimate Partner Violence: Not on file    Family History  Problem Relation Age of Onset   Aneurysm Mother    Heart disease Father     Allergies  Allergen Reactions   Penicillins Hives, Other (See Comments) and Rash    TOLERATED ROCEPHIN  AND CEFAZOLIN   Did it involve swelling of the face/tongue/throat, SOB, or low BP?  Yes  Did it involve sudden or severe rash/hives, skin peeling, or any reaction on the inside of your mouth or nose? No  Did you need to seek medical attention at a hospital or doctor's office? No  When did it last happen? Within the past 10 years   If all above answers are NO, may proceed with cephalosporin use.  Did it involve swelling of the face/tongue/throat, SOB, or low BP? Yes  Did it involve sudden or severe rash/hives, skin peeling, or any reaction on the inside of your mouth or nose? No  Did you need to seek medical attention at a hospital or doctor's office? No  When did it last happen? Within the past 10 years   If all above answers are "NO", may proceed with cephalosporin use.  Has patient had a PCN reaction causing immediate rash, facial/tongue/throat swelling, SOB or lightheadedness with hypotension: Yes  Has patient had a PCN reaction causing severe rash involving mucus membranes or skin necrosis: No  Has patient had a PCN reaction that required hospitalization: No  Has patient had a PCN reaction occurring within the last 10 years: Yes  If all of the above answers are NO, then may proceed with Cephalosporin use.  Did it involve swelling of the face/tongue/throat, SOB, or  low BP? Yes    Did it involve sudden or severe rash/hives, skin peeling, or any reaction on the inside of your mouth or nose? No    Did you need to seek medical attention at a hospital or doctor's office? No    When did it last happen? Within the past 10 years     If all above answers are "NO", may proceed with cephalosporin use.    Has patient had a PCN reaction causing immediate rash, facial/tongue/throat swelling, SOB or lightheadedness with hypotension: Yes    Has patient had a PCN reaction causing severe rash involving mucus membranes or skin necrosis: No    Has patient had a PCN reaction that required hospitalization: No    Has patient had a PCN reaction occurring within the last 10 years: Yes    If all of the above answers are NO, then may proceed with Cephalosporin use.    TOLERATED ROCEPHIN  AND CEFAZOLIN   Did it involve swelling of the face/tongue/throat, SOB, or low BP? Yes  Did it involve sudden or severe rash/hives, skin peeling, or any reaction on the inside of your mouth or nose? No  Did you need to seek medical attention at a hospital or doctor's office? No  When did it last happen? Within the past 10 years   If all above answers are NO, may proceed with cephalosporin use.  Did it involve swelling of the face/tongue/throat, SOB, or low BP? Yes Did it involve sudden or severe rash/hives, skin peeling, or any reaction on the inside of your mouth or nose? No Did you need to seek medical attention at a hospital or docto... (TRUNCATED)   Levofloxacin  Other (See Comments)    Dizziness   Oxycodone  Itching   [place]ozah  [Fezolinetant ] Rash   Vilazodone Other (See Comments)    unknown    Review of Systems  All other systems reviewed and are negative.      Objective:   BP 106/60   Pulse (!) 51   Ht 5' 1 (1.549 m)   Wt 230 lb (104.3 kg)   LMP 04/01/2018 (Exact Date)   SpO2 90%   BMI 43.46 kg/m   Vitals:   01/23/24  1304  BP: 106/60  Pulse: (!) 51  Height: 5' 1  (1.549 m)  Weight: 230 lb (104.3 kg)  SpO2: 90%  BMI (Calculated): 43.48    Physical Exam Vitals and nursing note reviewed.  Constitutional:      Appearance: Normal appearance. She is normal weight.  HENT:     Head: Normocephalic.  Eyes:     Extraocular Movements: Extraocular movements intact.     Conjunctiva/sclera: Conjunctivae normal.     Pupils: Pupils are equal, round, and reactive to light.  Cardiovascular:     Rate and Rhythm: Normal rate and regular rhythm.  Pulmonary:     Effort: Pulmonary effort is normal.  Musculoskeletal:        General: Normal range of motion.     Cervical back: Normal range of motion.  Neurological:     General: No focal deficit present.     Mental Status: She is alert and oriented to person, place, and time. Mental status is at baseline.  Psychiatric:        Mood and Affect: Mood normal.        Behavior: Behavior normal.        Thought Content: Thought content normal.        Judgment: Judgment normal.      Results for orders placed or performed in visit on 01/23/24  Iron, TIBC and Ferritin Panel  Result Value Ref Range   Total Iron Binding Capacity 290 250 - 450 ug/dL   UIBC 787 868 - 574 ug/dL   Iron 78 27 - 840 ug/dL   Iron Saturation 27 15 - 55 %   Ferritin 36 15 - 150 ng/mL  CBC with Diff  Result Value Ref Range   WBC 6.8 3.4 - 10.8 x10E3/uL   RBC 4.46 3.77 - 5.28 x10E6/uL   Hemoglobin 13.5 11.1 - 15.9 g/dL   Hematocrit 59.7 65.9 - 46.6 %   MCV 90 79 - 97 fL   MCH 30.3 26.6 - 33.0 pg   MCHC 33.6 31.5 - 35.7 g/dL   RDW 86.6 88.2 - 84.5 %   Platelets 322 150 - 450 x10E3/uL   Neutrophils 51 Not Estab. %   Lymphs 36 Not Estab. %   Monocytes 11 Not Estab. %   Eos 1 Not Estab. %   Basos 1 Not Estab. %   Neutrophils Absolute 3.5 1.4 - 7.0 x10E3/uL   Lymphocytes Absolute 2.4 0.7 - 3.1 x10E3/uL   Monocytes Absolute 0.7 0.1 - 0.9 x10E3/uL   EOS (ABSOLUTE) 0.1 0.0 - 0.4 x10E3/uL   Basophils Absolute 0.1 0.0 - 0.2 x10E3/uL    Immature Granulocytes 0 Not Estab. %   Immature Grans (Abs) 0.0 0.0 - 0.1 x10E3/uL  Lipid panel  Result Value Ref Range   Cholesterol, Total 116 100 - 199 mg/dL   Triglycerides 883 0 - 149 mg/dL   HDL 51 >60 mg/dL   VLDL Cholesterol Cal 21 5 - 40 mg/dL   LDL Chol Calc (NIH) 44 0 - 99 mg/dL   Chol/HDL Ratio 2.3 0.0 - 4.4 ratio  VITAMIN D  25 Hydroxy (Vit-D Deficiency, Fractures)  Result Value Ref Range   Vit D, 25-Hydroxy 45.2 30.0 - 100.0 ng/mL  CMP14+EGFR  Result Value Ref Range   Glucose 89 70 - 99 mg/dL   BUN 17 6 - 24 mg/dL   Creatinine, Ser 9.05 0.57 - 1.00 mg/dL   eGFR 71 >40 fO/fpw/8.26   BUN/Creatinine Ratio 18 9 - 23  Sodium 142 134 - 144 mmol/L   Potassium 4.4 3.5 - 5.2 mmol/L   Chloride 104 96 - 106 mmol/L   CO2 22 20 - 29 mmol/L   Calcium  9.5 8.7 - 10.2 mg/dL   Total Protein 6.6 6.0 - 8.5 g/dL   Albumin 4.2 3.8 - 4.9 g/dL   Globulin, Total 2.4 1.5 - 4.5 g/dL   Bilirubin Total 0.3 0.0 - 1.2 mg/dL   Alkaline Phosphatase 122 (H) 44 - 121 IU/L   AST 22 0 - 40 IU/L   ALT 21 0 - 32 IU/L  Hemoglobin A1c  Result Value Ref Range   Hgb A1c MFr Bld 5.9 (H) 4.8 - 5.6 %   Est. average glucose Bld gHb Est-mCnc 123 mg/dL  Vitamin A87  Result Value Ref Range   Vitamin B-12 472 232 - 1,245 pg/mL  TSH+T4F+T3Free  Result Value Ref Range   TSH 0.984 0.450 - 4.500 uIU/mL   T3, Free 2.5 2.0 - 4.4 pg/mL   Free T4 1.03 0.82 - 1.77 ng/dL    Recent Results (from the past 2160 hours)  Iron, TIBC and Ferritin Panel     Status: None   Collection Time: 01/23/24  1:46 PM  Result Value Ref Range   Total Iron Binding Capacity 290 250 - 450 ug/dL   UIBC 787 868 - 574 ug/dL   Iron 78 27 - 840 ug/dL   Iron Saturation 27 15 - 55 %   Ferritin 36 15 - 150 ng/mL  CBC with Diff     Status: None   Collection Time: 01/23/24  1:46 PM  Result Value Ref Range   WBC 6.8 3.4 - 10.8 x10E3/uL   RBC 4.46 3.77 - 5.28 x10E6/uL   Hemoglobin 13.5 11.1 - 15.9 g/dL   Hematocrit 59.7 65.9 - 46.6 %    MCV 90 79 - 97 fL   MCH 30.3 26.6 - 33.0 pg   MCHC 33.6 31.5 - 35.7 g/dL   RDW 86.6 88.2 - 84.5 %   Platelets 322 150 - 450 x10E3/uL   Neutrophils 51 Not Estab. %   Lymphs 36 Not Estab. %   Monocytes 11 Not Estab. %   Eos 1 Not Estab. %   Basos 1 Not Estab. %   Neutrophils Absolute 3.5 1.4 - 7.0 x10E3/uL   Lymphocytes Absolute 2.4 0.7 - 3.1 x10E3/uL   Monocytes Absolute 0.7 0.1 - 0.9 x10E3/uL   EOS (ABSOLUTE) 0.1 0.0 - 0.4 x10E3/uL   Basophils Absolute 0.1 0.0 - 0.2 x10E3/uL   Immature Granulocytes 0 Not Estab. %   Immature Grans (Abs) 0.0 0.0 - 0.1 x10E3/uL  Lipid panel     Status: None   Collection Time: 01/23/24  1:46 PM  Result Value Ref Range   Cholesterol, Total 116 100 - 199 mg/dL   Triglycerides 883 0 - 149 mg/dL   HDL 51 >60 mg/dL   VLDL Cholesterol Cal 21 5 - 40 mg/dL   LDL Chol Calc (NIH) 44 0 - 99 mg/dL   Chol/HDL Ratio 2.3 0.0 - 4.4 ratio    Comment:                                   T. Chol/HDL Ratio  Men  Women                               1/2 Avg.Risk  3.4    3.3                                   Avg.Risk  5.0    4.4                                2X Avg.Risk  9.6    7.1                                3X Avg.Risk 23.4   11.0   VITAMIN D  25 Hydroxy (Vit-D Deficiency, Fractures)     Status: None   Collection Time: 01/23/24  1:46 PM  Result Value Ref Range   Vit D, 25-Hydroxy 45.2 30.0 - 100.0 ng/mL    Comment: Vitamin D  deficiency has been defined by the Institute of Medicine and an Endocrine Society practice guideline as a level of serum 25-OH vitamin D  less than 20 ng/mL (1,2). The Endocrine Society went on to further define vitamin D  insufficiency as a level between 21 and 29 ng/mL (2). 1. IOM (Institute of Medicine). 2010. Dietary reference    intakes for calcium  and D. [place]: The    [employer]. 2. Holick MF, [employer], Bischoff-Ferrari HA, et al.    Evaluation, treatment, and prevention  of vitamin D     deficiency: an Endocrine Society clinical practice    guideline. JCEM. 2011 Jul; 96(7):1911-30.   CMP14+EGFR     Status: Abnormal   Collection Time: 01/23/24  1:46 PM  Result Value Ref Range   Glucose 89 70 - 99 mg/dL   BUN 17 6 - 24 mg/dL   Creatinine, Ser 9.05 0.57 - 1.00 mg/dL   eGFR 71 >40 fO/fpw/8.26   BUN/Creatinine Ratio 18 9 - 23   Sodium 142 134 - 144 mmol/L   Potassium 4.4 3.5 - 5.2 mmol/L   Chloride 104 96 - 106 mmol/L   CO2 22 20 - 29 mmol/L   Calcium  9.5 8.7 - 10.2 mg/dL   Total Protein 6.6 6.0 - 8.5 g/dL   Albumin 4.2 3.8 - 4.9 g/dL   Globulin, Total 2.4 1.5 - 4.5 g/dL   Bilirubin Total 0.3 0.0 - 1.2 mg/dL   Alkaline Phosphatase 122 (H) 44 - 121 IU/L   AST 22 0 - 40 IU/L   ALT 21 0 - 32 IU/L  Hemoglobin A1c     Status: Abnormal   Collection Time: 01/23/24  1:46 PM  Result Value Ref Range   Hgb A1c MFr Bld 5.9 (H) 4.8 - 5.6 %    Comment:          Prediabetes: 5.7 - 6.4          Diabetes: >6.4          Glycemic control for adults with diabetes: <7.0    Est. average glucose Bld gHb Est-mCnc 123 mg/dL  Vitamin B12     Status: None   Collection Time: 01/23/24  1:46 PM  Result Value Ref Range   Vitamin B-12 472 232 - 1,245 pg/mL  TSH+T4F+T3Free  Status: None   Collection Time: 01/23/24  1:46 PM  Result Value Ref Range   TSH 0.984 0.450 - 4.500 uIU/mL   T3, Free 2.5 2.0 - 4.4 pg/mL   Free T4 1.03 0.82 - 1.77 ng/dL  CBG monitoring, ED     Status: None   Collection Time: 01/31/24  9:23 PM  Result Value Ref Range   Glucose-Capillary 73 70 - 99 mg/dL    Comment: Glucose reference range applies only to samples taken after fasting for at least 8 hours.  Basic metabolic panel     Status: Abnormal   Collection Time: 01/31/24  9:25 PM  Result Value Ref Range   Sodium 136 135 - 145 mmol/L   Potassium 3.8 3.5 - 5.1 mmol/L   Chloride 99 98 - 111 mmol/L   CO2 28 22 - 32 mmol/L   Glucose, Bld 100 (H) 70 - 99 mg/dL    Comment: Glucose reference range  applies only to samples taken after fasting for at least 8 hours.   BUN 21 (H) 6 - 20 mg/dL   Creatinine, Ser 8.17 (H) 0.44 - 1.00 mg/dL   Calcium  9.2 8.9 - 10.3 mg/dL   GFR, Estimated 32 (L) >60 mL/min    Comment: (NOTE) Calculated using the CKD-EPI Creatinine Equation (2021)    Anion gap 9 5 - 15    Comment: Performed at [facility], [address] [place], [place] [address]  CBC     Status: None   Collection Time: 01/31/24  9:25 PM  Result Value Ref Range   WBC 8.6 4.0 - 10.5 K/uL   RBC 4.40 3.87 - 5.11 MIL/uL   Hemoglobin 13.2 12.0 - 15.0 g/dL   HCT 58.7 63.9 - 53.9 %   MCV 93.6 80.0 - 100.0 fL   MCH 30.0 26.0 - 34.0 pg   MCHC 32.0 30.0 - 36.0 g/dL   RDW 85.4 88.4 - 84.4 %   Platelets 305 150 - 400 K/uL   nRBC 0.0 0.0 - 0.2 %    Comment: Performed at [facility], [address] [place], [place] [address]  Troponin I (High Sensitivity)     Status: None   Collection Time: 01/31/24  9:25 PM  Result Value Ref Range   Troponin I (High Sensitivity) 4 <18 ng/L    Comment: (NOTE) Elevated high sensitivity troponin I (hsTnI) values and significant  changes across serial measurements may suggest ACS but many other  chronic and acute conditions are known to elevate hsTnI results.  Refer to the Links section for chest pain algorithms and additional  guidance. Performed at [facility], [address] [place], [place] [address]   Hepatic function panel     Status: None   Collection Time: 01/31/24  9:25 PM  Result Value Ref Range   Total Protein 7.2 6.5 - 8.1 g/dL   Albumin 4.0 3.5 - 5.0 g/dL   AST 26 15 - 41 U/L   ALT 24 0 - 44 U/L   Alkaline Phosphatase 83 38 - 126 U/L   Total Bilirubin 0.6 0.0 - 1.2 mg/dL   Bilirubin, Direct <9.8 0.0 - 0.2 mg/dL   Indirect Bilirubin NOT CALCULATED 0.3 - 0.9 mg/dL    Comment: Performed at [facility], [address] [place], [place] [address]  Lipase, blood     Status: None   Collection Time:  01/31/24  9:25 PM  Result Value Ref Range   Lipase 31 11 - 51  U/L    Comment: Performed at [facility], [address] [place], [place] [address]  Troponin I (High Sensitivity)     Status: None   Collection Time: 01/31/24 11:47 PM  Result Value Ref Range   Troponin I (High Sensitivity) 2 <18 ng/L    Comment: (NOTE) Elevated high sensitivity troponin I (hsTnI) values and significant  changes across serial measurements may suggest ACS but many other  chronic and acute conditions are known to elevate hsTnI results.  Refer to the Links section for chest pain algorithms and additional  guidance. Performed at [facility], [address] [place], [place] [address]   CK     Status: None   Collection Time: 01/31/24 11:47 PM  Result Value Ref Range   Total CK 132 38 - 234 U/L    Comment: Performed at [facility], [address] [place], [place] [address]  Urinalysis, Routine w reflex microscopic -Urine, Clean Catch     Status: Abnormal   Collection Time: 02/01/24  1:14 AM  Result Value Ref Range   Color, Urine YELLOW (A) YELLOW   APPearance HAZY (A) CLEAR   Specific Gravity, Urine 1.008 1.005 - 1.030   pH 6.0 5.0 - 8.0   Glucose, UA NEGATIVE NEGATIVE mg/dL   Hgb urine dipstick NEGATIVE NEGATIVE   Bilirubin Urine NEGATIVE NEGATIVE   Ketones, ur NEGATIVE NEGATIVE mg/dL   Protein, ur NEGATIVE NEGATIVE mg/dL   Nitrite NEGATIVE NEGATIVE   Leukocytes,Ua NEGATIVE NEGATIVE    Comment: Performed at [facility], [address] [place], [place] [address]  Basic metabolic panel with GFR     Status: Abnormal   Collection Time: 02/01/24  1:58 AM  Result Value Ref Range   Sodium 136 135 - 145 mmol/L   Potassium 3.8 3.5 - 5.1 mmol/L   Chloride 101 98 - 111 mmol/L   CO2 27 22 - 32 mmol/L   Glucose, Bld 84 70 - 99 mg/dL    Comment: Glucose reference range applies only to samples taken after fasting for at least 8 hours.   BUN 20 6 - 20 mg/dL    Creatinine, Ser 8.59 (H) 0.44 - 1.00 mg/dL   Calcium  8.5 (L) 8.9 - 10.3 mg/dL   GFR, Estimated 44 (L) >60 mL/min    Comment: (NOTE) Calculated using the CKD-EPI Creatinine Equation (2021)    Anion gap 8 5 - 15    Comment: Performed at [facility], [address] [place], [place] [address]  POC CREATINE & ALBUMIN,URINE     Status: None   Collection Time: 02/26/24  1:49 PM  Result Value Ref Range   Microalbumin Ur, POC 80 mg/L   Creatinine, POC 300 mg/dL   Albumin/Creatinine Ratio, Urine, POC 30-300        Assessment & Plan Type 2 diabetes mellitus with hyperglycemia, without long-term current use of insulin  (HCC) Checking labs today. Will call pt. With results  Continue current diabetes POC, as patient has been well controlled on current regimen.  Will adjust meds if needed based on labs.   -CBC w/Diff -CMP w/eGFR -Hemoglobin A1C  Essential hypertension, benign Blood pressure well controlled with current medications.  Continue current therapy.  Will reassess at follow up.   - CBC w/Diff - CMP w/eGFR  Mixed hyperlipidemia Checking labs today.  Continue current therapy for lipid control. Will modify as needed based on labwork results.   -CMP w/eGFR -Lipid Panel  B12 deficiency due to diet  Vitamin D  deficiency, unspecified Other fatigue Hair loss Checking labs today.  Will continue supplements as needed.   - Vitamin D  - Vitamin B12 - TSH  Morbid obesity (HCC) Body mass index 40.0-44.9, adult [facility]) Sending referral to provider as requested by pt.  Continue current meds.  Will adjust as needed based on results.  The patient is asked to make an attempt to improve diet and exercise patterns to aid in medical management of this problem. Addressed importance of increasing and maintaining water intake.   CVA, old, hemiparesis (HCC) Patient stable.  Well controlled with current therapy.   Continue current meds.   Chronic obstructive pulmonary  disease, unspecified COPD type (HCC) Patient stable.  Well controlled with current therapy.   Continue current meds.   Major depressive disorder, recurrent episode, moderate (HCC) Adding Trintellix .  Patient given samples in office today  Will send RX if this is effective for her.     Return in about 1 month (around 02/22/2024) for F/U.   Total time spent: 30 minutes  ALAN CHRISTELLA ARRANT, FNP  01/23/2024   This document may have been prepared by [facility] Voice Recognition software and as such may include unintentional dictation errors.

## 2024-04-20 NOTE — Assessment & Plan Note (Addendum)
 Adding Trintellix .  Patient given samples in office today  Will send RX if this is effective for her.

## 2024-04-20 NOTE — Assessment & Plan Note (Signed)
 Checking labs today.  Will continue supplements as needed.   - Vitamin D  - Vitamin B12 - TSH

## 2024-04-24 ENCOUNTER — Ambulatory Visit

## 2024-04-25 ENCOUNTER — Ambulatory Visit (INDEPENDENT_AMBULATORY_CARE_PROVIDER_SITE_OTHER)

## 2024-04-25 ENCOUNTER — Other Ambulatory Visit: Payer: Self-pay | Admitting: Family

## 2024-04-25 DIAGNOSIS — I371 Nonrheumatic pulmonary valve insufficiency: Secondary | ICD-10-CM | POA: Diagnosis not present

## 2024-04-25 DIAGNOSIS — I340 Nonrheumatic mitral (valve) insufficiency: Secondary | ICD-10-CM | POA: Diagnosis not present

## 2024-04-25 DIAGNOSIS — E782 Mixed hyperlipidemia: Secondary | ICD-10-CM

## 2024-04-25 DIAGNOSIS — R55 Syncope and collapse: Secondary | ICD-10-CM

## 2024-04-25 DIAGNOSIS — E1165 Type 2 diabetes mellitus with hyperglycemia: Secondary | ICD-10-CM

## 2024-04-25 DIAGNOSIS — G4733 Obstructive sleep apnea (adult) (pediatric): Secondary | ICD-10-CM

## 2024-04-25 DIAGNOSIS — I361 Nonrheumatic tricuspid (valve) insufficiency: Secondary | ICD-10-CM

## 2024-04-25 DIAGNOSIS — Z8679 Personal history of other diseases of the circulatory system: Secondary | ICD-10-CM

## 2024-04-25 DIAGNOSIS — I10 Essential (primary) hypertension: Secondary | ICD-10-CM

## 2024-04-28 ENCOUNTER — Ambulatory Visit: Admitting: Family

## 2024-04-29 ENCOUNTER — Ambulatory Visit: Admitting: Cardiovascular Disease

## 2024-04-30 ENCOUNTER — Ambulatory Visit: Admitting: Family

## 2024-05-02 ENCOUNTER — Other Ambulatory Visit: Payer: Self-pay

## 2024-05-05 ENCOUNTER — Other Ambulatory Visit: Payer: Self-pay

## 2024-05-05 DIAGNOSIS — E782 Mixed hyperlipidemia: Secondary | ICD-10-CM

## 2024-05-05 DIAGNOSIS — I10 Essential (primary) hypertension: Secondary | ICD-10-CM

## 2024-05-05 DIAGNOSIS — M797 Fibromyalgia: Secondary | ICD-10-CM

## 2024-05-07 ENCOUNTER — Telehealth: Payer: Self-pay

## 2024-05-07 ENCOUNTER — Ambulatory Visit
Attending: Student in an Organized Health Care Education/Training Program | Admitting: Student in an Organized Health Care Education/Training Program

## 2024-05-07 DIAGNOSIS — E1165 Type 2 diabetes mellitus with hyperglycemia: Secondary | ICD-10-CM | POA: Diagnosis not present

## 2024-05-07 DIAGNOSIS — I63511 Cerebral infarction due to unspecified occlusion or stenosis of right middle cerebral artery: Secondary | ICD-10-CM

## 2024-05-07 DIAGNOSIS — I10 Essential (primary) hypertension: Secondary | ICD-10-CM | POA: Diagnosis not present

## 2024-05-07 DIAGNOSIS — I6523 Occlusion and stenosis of bilateral carotid arteries: Secondary | ICD-10-CM | POA: Diagnosis not present

## 2024-05-07 DIAGNOSIS — M47816 Spondylosis without myelopathy or radiculopathy, lumbar region: Secondary | ICD-10-CM

## 2024-05-07 DIAGNOSIS — Z8679 Personal history of other diseases of the circulatory system: Secondary | ICD-10-CM

## 2024-05-07 DIAGNOSIS — R42 Dizziness and giddiness: Secondary | ICD-10-CM | POA: Diagnosis not present

## 2024-05-07 DIAGNOSIS — E782 Mixed hyperlipidemia: Secondary | ICD-10-CM

## 2024-05-07 NOTE — Telephone Encounter (Signed)
 Call and spoke with patient to go over nursing questions prior to telephone visit with Dr. Marcelino this afternoon.

## 2024-05-07 NOTE — Progress Notes (Signed)
 PROVIDER NOTE: Interpretation of information contained herein should be left to medically-trained personnel. Specific patient instructions are provided elsewhere under Patient Instructions section of medical record. This document was created in part using AI and STT-dictation technology, any transcriptional errors that may result from this process are unintentional.  Patient: Terri Wood  Service: E/M   PCP: Orlean Alan HERO, FNP  DOB: [birth year]  DOS: 05/07/2024  Provider: Wallie Sherry, MD  MRN: [id number]  Delivery: Virtual Visit  Specialty: Interventional Pain Management  Type: Established Patient  Setting: Ambulatory outpatient facility  Specialty designation: 09  Referring Prov.: Orlean Alan HERO, FNP  Location: Remote location       Virtual Encounter - Pain Management PROVIDER NOTE: Information contained herein reflects review and annotations entered in association with encounter. Interpretation of such information and data should be left to medically-trained personnel. Information provided to patient can be located elsewhere in the medical record under Patient Instructions. Document created using STT-dictation technology, any transcriptional errors that may result from process are unintentional.    Contact & Pharmacy Preferred: [phone] Home: [phone] (home) Mobile: [phone] (mobile) E-mail: [email or web address]  [id number] - [place],  - [address] [place] [address] Phone: [phone] Fax: [phone]  [facility] [address] [place] [address] Phone: [phone] Fax: [phone]   Pre-screening  [name] offered in-person vs virtual encounter. She indicated preferring virtual for this encounter.   Reason COVID-19*  Social distancing based on CDC and AMA recommendations.   I contacted Laloni J Piccirilli on 05/07/2024 via telephone.      I clearly identified myself as Wallie Sherry, MD. I verified that I was speaking with the correct person using two identifiers (Name: Terri Wood, and date of birth: 22-Dec-1965).  Consent I sought verbal advanced consent from Annalis J Ryker for virtual visit interactions. I informed [name] of possible security and privacy concerns, risks, and limitations associated with providing not-in-person medical evaluation and management services. I also informed [name] of the availability of in-person appointments. Finally, I informed her that there would be a charge for the virtual visit and that she could be  personally, fully or partially, financially responsible for it. [name] expressed understanding and agreed to proceed.   Historic Elements   [name] is a 58 y.o. year old, female patient evaluated today after our last contact on 04/16/2024. [name]  has a past medical history of Acute ischemic right MCA stroke (HCC) (08/15/2019), Acute respiratory failure (HCC) (01/16/2021), Anxiety, Aortic atherosclerosis (HCC), Atrial fibrillation (HCC), Avascular necrosis of left femoral head (HCC) (04/09/2018), CAD (coronary artery disease) (03/13/2023), Carotid artery disease (HCC) (08/16/2019), Chronic midline low back pain with right-sided sciatica (02/11/2016), Community acquired pneumonia (01/16/2021), Depression, Essential hypertension, Fibromyalgia, Foraminal stenosis of lumbar region (02/27/2018), GERD (gastroesophageal reflux disease), Long term current use of amiodarone , Lumbar radiculopathy (12/15/2016), Mixed hyperlipidemia, Multifocal pneumonia (06/05/2021), OAB (overactive bladder), On chronic clopidogrel  therapy, On rivaroxaban  therapy, Osteoarthritis of left hip (2019), Restless leg syndrome (02/02/2015), Rotator cuff tendinitis, right (06/04/2023), Sepsis (HCC) (01/16/2021), T2DM (type 2 diabetes mellitus) (HCC), Tendinitis of upper biceps tendon of right shoulder (06/04/2023), and Traumatic complete tear of right  rotator cuff (06/04/2023). She also  has a past surgical history that includes Abdominal surgery (1996); Plantar fascia surgery (Right, 2009); Total hip arthroplasty (Left, 04/09/2018); Total knee arthroplasty (Right, 07/12/2021); Gastric bypass open (1996); Tarsal tunnel release (Right, 2009); Total knee  arthroplasty (Left, 02/16/2022); and Reverse shoulder arthroplasty (Right, 08/23/2023). [name] has a current medication list which includes the following prescription(s): accu-chek guide, amiodarone , vitamin c with rose hips, atorvastatin , azelastine , b-complex with vitamin c, biotin, black cohosh, bupropion , vitamin d3, clopidogrel , diclofenac sodium, doxycycline , duloxetine , gabapentin , gemtesa , hydrocodone -acetaminophen , lactobacillus-inulin, lisinopril -hydrochlorothiazide , magnesium , metoprolol  succinate, metoprolol  tartrate, pantoprazole , rivaroxaban , sod fluoride -potassium nitrate , mounjaro , trazodone , vitamin e, and vortioxetine  hbr. She  reports that she quit smoking about 4 years ago. Her smoking use included cigarettes. She has never used smokeless tobacco. She reports that she does not currently use alcohol. She reports that she does not currently use drugs after having used the following drugs: Marijuana. [name] is allergic to penicillins, levofloxacin , oxycodone , veozah  [fezolinetant ], and vilazodone.  BMI: Estimated body mass index is 43.46 kg/m as calculated from the following:   Height as of 04/16/24: 5' 1 (1.549 m).   Weight as of 04/16/24: 230 lb (104.3 kg). Last encounter: 04/08/2024. Last procedure: 04/16/2024.  HPI  Today, she is being contacted for  PPE  Post-Procedure Evaluation   Type: Lumbar Facet, Medial Branch Block(s) (w/ fluoroscopic mapping) #2  Laterality: Bilateral  Level: L3, L4, and L5 Medial Branch Level(s). Injecting these levels blocks the L3-4 and L4-5 lumbar facet joints.  Imaging: Fluoroscopic guidance Spinal ([id number]) Anesthesia: Local anesthesia (1-2%  Lidocaine ) Sedation: Minimal Sedation                       DOS: 04/16/2024 Performed by: Wallie Sherry, MD  Primary Purpose: Diagnostic/Therapeutic Indications: Low back pain severe enough to impact quality of life or function. 1. Lumbar facet arthropathy   2. Lumbar spondylosis   3. Chronic pain syndrome     NAS-11 Pain score:   Pre-procedure: 8 /10   Post-procedure: 0-No pain/10     Effectiveness:  Initial hour after procedure: 100 %  Subsequent 4-6 hours post-procedure: 100 %  Analgesia past initial 6 hours: 80% for first week, then 50% for second week, then decreased back to baseline. Ongoing improvement:  Analgesic:  back to baseline Function: Back to baseline ROM: Back to baseline   No results found for: CBDTHCR, KATHLYNE, D9THCCBX  Laboratory Chemistry Profile   Renal Lab Results  Component Value Date   BUN 20 02/01/2024   CREATININE 1.40 (H) 02/01/2024   BCR 18 01/23/2024   GFRAA >60 08/20/2019   GFRNONAA 44 (L) 02/01/2024    Hepatic Lab Results  Component Value Date   AST 26 01/31/2024   ALT 24 01/31/2024   ALBUMIN 4.0 01/31/2024   ALKPHOS 83 01/31/2024   LIPASE 31 01/31/2024    Electrolytes Lab Results  Component Value Date   NA 136 02/01/2024   K 3.8 02/01/2024   CL 101 02/01/2024   CALCIUM  8.5 (L) 02/01/2024   MG 2.1 06/07/2021   PHOS 4.1 08/20/2019    Bone Lab Results  Component Value Date   VD25OH 45.2 01/23/2024    Inflammation (CRP: Acute Phase) (ESR: Chronic Phase) Lab Results  Component Value Date   CRP <0.8 08/16/2019   ESRSEDRATE 6 08/16/2019   LATICACIDVEN 1.4 06/06/2021         Note: Above Lab results reviewed.  Imaging  PCV ECHOCARDIOGRAM COMPLETE Images from the original result were not included. Reason for Visit   Echocardiogram  INDICATIONS:     Echocardiogram: An echocardiogram in (2-d) mode was performed and in Doppler mode with  color flow velocity mapping was performed.   ventricular septum  thickness  1.25 cm, L ventricular posterior wall  thickness (diastole) 1.19 cm, left atrium size 3.7 cm, right atrium 3.03  cm, aortic root diameter 2.9 cm, L ventricle diastolic dimension 4.8 cm, L  ventricle systolic dimension 3.37, L ventricle ejection fraction 57 %, and  LV fractional shortening 29.8 % L ventricular outflow tract internal diameter 1.8 cm, L ventricular  outflow tract flow velocity 1.23 m/s, aortic valve cusps 2.0 cm , aortic  valve flow velocity 1.59 (m/sec), aortic valve systolic calculated mean  flow gradient 5 mmHg, mitral valve diastolic peak flow velocity E .97  m/sec, and mitral valve diastolic peak flow E/A ratio 1.0  Mitral valve has trace regurgitation Pulmonic valve has trace regurgitation Tricuspid valve has trace regurgitation  ASSESSMENT Technically difficult study due to body habitus. Normal chamber sizes. Normal left ventricular systolic function. Mild left ventricular hypertrophy with GRADE 2 (psuedonormalization )  diastolic dysfunction. Normal right ventricular systolic function. Normal right ventricular diastolic function. Normal left ventricular wall motion. Normal right ventricular wall motion. Trace pulmonary regurgitation. Trace tricuspid regurgitation. Normal pulmonary artery pressure. Trace mitral regurgitation. No pericardial effusion.  Assessment  The primary encounter diagnosis was Lumbar facet arthropathy. Diagnoses of Lumbar spondylosis, Essential hypertension, benign, Bilateral carotid artery stenosis, Atrial fibrillation, currently in sinus rhythm, Mixed hyperlipidemia, Acute ischemic right MCA stroke (HCC), Type 2 diabetes mellitus with hyperglycemia, without long-term current use of insulin  (HCC), and Dizziness were also pertinent to this visit.  Plan of Care  The patient has undergone two positive diagnostic lumbar facet and medial branch nerve blocks, each providing >=80% pain relief for the expected duration of action. Given  the reproducible and significant pain relief with these targeted blocks, I recommend proceeding with lumbar radiofrequency ablation (RFA) of the medial branch nerves to achieve longer-term pain reduction and functional improvement. The patient was counseled on the procedure, expected outcomes, and potential risks, and agrees with the plan.   Orders:  Orders Placed This Encounter  Procedures   Radiofrequency,Lumbar    Standing Status:   Future    Expected Date:   05/21/2024    Expiration Date:   05/07/2025    Scheduling Instructions:     Side(s): Bilateral     Level(s): L3, L4, L5, Medial Branch Nerve(s)     Sedation: With Sedation     Scheduling Timeframe: As soon as pre-approved    Where will this procedure be performed?:   [facility]   Follow-up plan:   Return in about 19 days (around 05/26/2024) for B/L L3, 4, 5 RFA, ECT.      BLF L3-5 02/25/24, 04/16/24    Recent Visits Date Type Provider Dept  04/16/24 Procedure visit Marcelino Nurse, MD [facility]  04/08/24 Office Visit Marcelino Nurse, MD [facility]  02/25/24 Procedure visit Marcelino Nurse, MD [facility]  Showing recent visits within past 90 days and meeting all other requirements Today's Visits Date Type Provider Dept  05/07/24 Office Visit Marcelino Nurse, MD [facility]  Showing today's visits and meeting all other requirements Future Appointments No visits were found meeting these conditions. Showing future appointments within next 90 days and meeting all other requirements  I discussed the assessment and treatment plan with the patient. The patient was provided an opportunity to ask questions and all were answered. The patient agreed with the plan and demonstrated an understanding of the instructions.  Patient advised to call back or seek an in-person evaluation if the symptoms or condition worsens.  Duration of encounter: .  Note by: Wallie Sherry, MD Date:  05/07/2024; Time: 2:34 PM

## 2024-05-08 ENCOUNTER — Ambulatory Visit

## 2024-05-08 DIAGNOSIS — R55 Syncope and collapse: Secondary | ICD-10-CM | POA: Diagnosis not present

## 2024-05-08 DIAGNOSIS — R079 Chest pain, unspecified: Secondary | ICD-10-CM

## 2024-05-08 MED ORDER — TECHNETIUM TC 99M SESTAMIBI GENERIC - CARDIOLITE
31.4000 | Freq: Once | INTRAVENOUS | Status: AC | PRN
  Administered 2024-05-08: 31.4 via INTRAVENOUS

## 2024-05-08 MED ORDER — TECHNETIUM TC 99M SESTAMIBI GENERIC - CARDIOLITE
11.0600 | Freq: Once | INTRAVENOUS | Status: AC | PRN
  Administered 2024-05-08: 11.06 via INTRAVENOUS

## 2024-05-09 ENCOUNTER — Encounter: Payer: Self-pay | Admitting: Cardiovascular Disease

## 2024-05-09 ENCOUNTER — Ambulatory Visit: Admitting: Cardiovascular Disease

## 2024-05-09 VITALS — BP 104/60 | HR 63 | Ht 61.0 in | Wt 228.0 lb

## 2024-05-09 DIAGNOSIS — I5033 Acute on chronic diastolic (congestive) heart failure: Secondary | ICD-10-CM

## 2024-05-09 DIAGNOSIS — M797 Fibromyalgia: Secondary | ICD-10-CM

## 2024-05-09 DIAGNOSIS — Z8679 Personal history of other diseases of the circulatory system: Secondary | ICD-10-CM | POA: Diagnosis not present

## 2024-05-09 DIAGNOSIS — I63511 Cerebral infarction due to unspecified occlusion or stenosis of right middle cerebral artery: Secondary | ICD-10-CM

## 2024-05-09 DIAGNOSIS — I6523 Occlusion and stenosis of bilateral carotid arteries: Secondary | ICD-10-CM

## 2024-05-09 DIAGNOSIS — E782 Mixed hyperlipidemia: Secondary | ICD-10-CM

## 2024-05-09 DIAGNOSIS — I679 Cerebrovascular disease, unspecified: Secondary | ICD-10-CM | POA: Diagnosis not present

## 2024-05-09 DIAGNOSIS — R002 Palpitations: Secondary | ICD-10-CM

## 2024-05-09 DIAGNOSIS — I10 Essential (primary) hypertension: Secondary | ICD-10-CM | POA: Diagnosis not present

## 2024-05-09 MED ORDER — DAPAGLIFLOZIN PROPANEDIOL 10 MG PO TABS: 10.0000 mg | ORAL_TABLET | Freq: Every day | ORAL | 3 refills | Status: AC

## 2024-05-09 MED ORDER — AMIODARONE HCL 200 MG PO TABS: 200.0000 mg | ORAL_TABLET | Freq: Every day | ORAL | 2 refills | Status: AC | PRN

## 2024-05-09 MED ORDER — PANTOPRAZOLE SODIUM 40 MG PO TBEC: 40.0000 mg | DELAYED_RELEASE_TABLET | Freq: Every day | ORAL | 1 refills | Status: DC

## 2024-05-09 NOTE — Progress Notes (Signed)
 Cardiology Office Note   Date:  05/09/2024   ID:  Terri Wood, DOB 10-26-1965, MRN [id number]  PCP:  Orlean Alan HERO, FNP  Cardiologist:  Denyse Bathe, MD      History of Present Illness: Terri Wood is a 58 y.o. female who presents for  Chief Complaint  Patient presents with   Follow-up    NST/ ECHO US  Results    Feels tired.      Past Medical History:  Diagnosis Date   Acute ischemic right MCA stroke (HCC) 08/15/2019   Acute respiratory failure (HCC) 01/16/2021   a.) in setting of CAP   Anxiety    Aortic atherosclerosis (HCC)    Atrial fibrillation (HCC)    a.) CHA2DS2-VASc = 5 (sex, HTN, CVA x2, vascular disease history) as of 08/20/2023; b.) cardiac rate/rhythm maintained on oral amiodarone  + metoprolol ; chronically anticoagulated using rivaroxaban ; on chronic clopidogrel    Avascular necrosis of left femoral head (HCC) 04/09/2018   CAD (coronary artery disease) 03/13/2023   a.) MV 03/13/2023: small mild reversible bas/mid anterosep/inferosep/apex defects c/w isch; b.) cCTA 03/29/2023: Ca2+ = 673.3 (99th %'ile)   Carotid artery disease (HCC) 08/16/2019   a.) doppler 08/16/2019: 40-59% LICA   Chronic midline low back pain with right-sided sciatica 02/11/2016   Community acquired pneumonia 01/16/2021   Depression    Essential hypertension    Fibromyalgia    Foraminal stenosis of lumbar region 02/27/2018   GERD (gastroesophageal reflux disease)    Long term current use of amiodarone     Lumbar radiculopathy 12/15/2016   Mixed hyperlipidemia    Multifocal pneumonia 06/05/2021   OAB (overactive bladder)    On chronic clopidogrel  therapy    On rivaroxaban  therapy    Osteoarthritis of left hip 2019   Restless leg syndrome 02/02/2015   Rotator cuff tendinitis, right 06/04/2023   Sepsis (HCC) 01/16/2021   T2DM (type 2 diabetes mellitus) (HCC)    Tendinitis of upper biceps tendon of right shoulder 06/04/2023   Traumatic complete tear of right rotator  cuff 06/04/2023     Past Surgical History:  Procedure Laterality Date   ABDOMINAL SURGERY  1996   gastric bypass; stapling; surgilite   GASTRIC BYPASS OPEN  1996   PLANTAR FASCIA SURGERY Right 2009   REVERSE SHOULDER ARTHROPLASTY Right 08/23/2023   Procedure: REVERSE SHOULDER ARTHROPLASTY WITH BICEPS TENODESIS;  Surgeon: Edie Norleen JINNY, MD;  Location: [facility];  Service: Orthopedics;  Laterality: Right;   TARSAL TUNNEL RELEASE Right 2009   TOTAL HIP ARTHROPLASTY Left 04/09/2018   Procedure: TOTAL HIP ARTHROPLASTY ANTERIOR APPROACH;  Surgeon: Kathlynn Sharper, MD;  Location: [facility];  Service: Orthopedics;  Laterality: Left;   TOTAL KNEE ARTHROPLASTY Right 07/12/2021   Procedure: TOTAL KNEE ARTHROPLASTY;  Surgeon: Kathlynn Sharper, MD;  Location: [facility];  Service: Orthopedics;  Laterality: Right;   TOTAL KNEE ARTHROPLASTY Left 02/16/2022   Procedure: TOTAL KNEE ARTHROPLASTY;  Surgeon: Kathlynn Sharper, MD;  Location: [facility];  Service: Orthopedics;  Laterality: Left;     Current Outpatient Medications  Medication Sig Dispense Refill   ACCU-CHEK GUIDE test strip USE TO CHECK BLOOD GLUCOSE ONCE DAILY 100 strip 1   Ascorbic Acid (VITAMIN C WITH ROSE HIPS) 1000 MG tablet Take 1,000 mg by mouth daily.     atorvastatin  (LIPITOR ) 80 MG tablet TAKE 1 TABLET BY MOUTH EVERY DAY FOR CHOLESTEROL 90 tablet 3   azelastine  (ASTELIN ) 0.1 % nasal spray Place 2 sprays into both nostrils 2 (two) times  daily. Use in each nostril as directed 30 mL 12   B Complex-C (B-COMPLEX WITH VITAMIN C) tablet Take 1 tablet by mouth daily.     BIOTIN PO Take 1 drop by mouth. With collagen     BLACK COHOSH EXTRACT PO Take 1 tablet by mouth daily.     buPROPion  (WELLBUTRIN  XL) 150 MG 24 hr tablet TAKE 1 TABLET (150 MG TOTAL) BY MOUTH IN THE MORNING 90 tablet 1   Cholecalciferol  (VITAMIN D3) 125 MCG (5000 UT) CAPS Take 5,000 Units by mouth daily.     clopidogrel  (PLAVIX ) 75 MG tablet TAKE 1 TABLET BY MOUTH EVERY DAY 90 tablet 1    dapagliflozin propanediol (FARXIGA) 10 MG TABS tablet Take 1 tablet (10 mg total) by mouth daily. 30 tablet 3   diclofenac Sodium (VOLTAREN) 1 % GEL Apply 2 g topically 4 (four) times daily. As needed     doxycycline  (VIBRA -TABS) 100 MG tablet TAKE 1 TABLET BY MOUTH 2 TIMES DAILY FOR 7 DAYS. 14 tablet 0   DULoxetine  (CYMBALTA ) 60 MG capsule TAKE 1 CAPSULE BY MOUTH TWICE A DAY 180 capsule 1   gabapentin  (NEURONTIN ) 600 MG tablet Take 1 tablet (600 mg total) by mouth 3 (three) times daily. Take 1 tablet (300 mg) by mouth in the morning & take 2 tablets (600 mg) by mouth at night. 270 tablet 1   GEMTESA  75 MG TABS TAKE 1 TABLET BY MOUTH AT BEDTIME. 90 tablet 1   HYDROcodone -acetaminophen  (NORCO) 10-325 MG tablet Take 1 tablet by mouth every 4 (four) hours as needed for moderate pain (pain score 4-6) or severe pain (pain score 7-10) (pain). 40 tablet 0   Lactobacillus-Inulin (PROBIOTIC DIGESTIVE SUPPORT PO) Take by mouth.     lisinopril -hydrochlorothiazide  (ZESTORETIC ) 10-12.5 MG tablet Take 1 tablet by mouth daily with supper.     Magnesium  500 MG CAPS Take 500 mg by mouth in the morning.     metoprolol  succinate (TOPROL -XL) 25 MG 24 hr tablet TAKE 1 TABLET (25 MG TOTAL) BY MOUTH DAILY. 90 tablet 1   metoprolol  tartrate (LOPRESSOR ) 50 MG tablet Take 1 tablet (50 mg total) by mouth 2 (two) times daily. 180 tablet 1   rivaroxaban  (XARELTO ) 20 MG TABS tablet Take 1 tablet (20 mg total) by mouth daily with supper. 30 tablet 3   Sod Fluoride -Potassium Nitrate  (PREVIDENT 5000 ENAMEL PROTECT) 1.1-5 % GEL Use paste to brush teeth 2 (two) times daily morning and night. Do not eat or drink 30 minutes after use. 100 mL 3   tirzepatide  (MOUNJARO ) 15 MG/0.5ML Pen Inject 15 mg into the skin once a week. 6 mL 1   traZODone  (DESYREL ) 50 MG tablet TAKE 1 TABLET BY MOUTH EVERYDAY AT BEDTIME 90 tablet 1   VITAMIN E PO Take 1 capsule by mouth every evening.     vortioxetine  HBr (TRINTELLIX ) 5 MG TABS tablet Take 1  tablet (5 mg total) by mouth in the morning. 30 tablet 3   amiodarone  (PACERONE ) 200 MG tablet Take 1 tablet (200 mg total) by mouth daily as needed. 30 tablet 2   pantoprazole  (PROTONIX ) 40 MG tablet Take 1 tablet (40 mg total) by mouth daily. 90 tablet 1   No current facility-administered medications for this visit.    Allergies:   Penicillins, Levofloxacin , Oxycodone , Veozah  [fezolinetant ], and Vilazodone    Social History:   reports that she quit smoking about 4 years ago. Her smoking use included cigarettes. She has never used smokeless tobacco. She reports  that she does not currently use alcohol. She reports that she does not currently use drugs after having used the following drugs: Marijuana.   Family History:  family history includes Aneurysm in her mother; Heart disease in her father.    ROS:     Review of Systems  Constitutional: Negative.   HENT: Negative.    Eyes: Negative.   Respiratory: Negative.    Gastrointestinal: Negative.   Genitourinary: Negative.   Musculoskeletal: Negative.   Skin: Negative.   Neurological: Negative.   Endo/Heme/Allergies: Negative.   Psychiatric/Behavioral: Negative.    All other systems reviewed and are negative.     All other systems are reviewed and negative.    PHYSICAL EXAM: VS:  BP 104/60   Pulse 63   Ht 5' 1 (1.549 m)   Wt 228 lb (103.4 kg)   LMP 04/01/2018 (Exact Date)   SpO2 96%   BMI 43.08 kg/m  , BMI Body mass index is 43.08 kg/m. Last weight:  Wt Readings from Last 3 Encounters:  05/09/24 228 lb (103.4 kg)  04/16/24 230 lb (104.3 kg)  04/08/24 225 lb (102.1 kg)     Physical Exam Constitutional:      Appearance: Normal appearance.  Cardiovascular:     Rate and Rhythm: Normal rate and regular rhythm.     Heart sounds: Normal heart sounds.  Pulmonary:     Effort: Pulmonary effort is normal.     Breath sounds: Normal breath sounds.  Musculoskeletal:     Right lower leg: No edema.     Left lower leg: No  edema.  Neurological:     Mental Status: She is alert.       EKG:   Recent Labs: 01/23/2024: TSH 0.984 01/31/2024: ALT 24; Hemoglobin 13.2; Platelets 305 02/01/2024: BUN 20; Creatinine, Ser 1.40; Potassium 3.8; Sodium 136    Lipid Panel    Component Value Date/Time   CHOL 116 01/23/2024 1346   TRIG 116 01/23/2024 1346   HDL 51 01/23/2024 1346   CHOLHDL 2.3 01/23/2024 1346   CHOLHDL 3.6 08/16/2019 0516   VLDL 26 08/16/2019 0516   LDLCALC 44 01/23/2024 1346      Other studies Reviewed: Additional studies/ records that were reviewed today include:  Review of the above records demonstrates:       No data to display            ASSESSMENT AND PLAN:    ICD-10-CM   1. Bilateral carotid artery stenosis  I65.23 amiodarone  (PACERONE ) 200 MG tablet    dapagliflozin propanediol (FARXIGA) 10 MG TABS tablet    2. Essential hypertension, benign  I10 amiodarone  (PACERONE ) 200 MG tablet    pantoprazole  (PROTONIX ) 40 MG tablet    dapagliflozin propanediol (FARXIGA) 10 MG TABS tablet    3. Mixed hyperlipidemia  E78.2 amiodarone  (PACERONE ) 200 MG tablet    pantoprazole  (PROTONIX ) 40 MG tablet    dapagliflozin propanediol (FARXIGA) 10 MG TABS tablet    4. Palpitations  R00.2 dapagliflozin propanediol (FARXIGA) 10 MG TABS tablet   stress test ok, apical defect fixed defect. Add farxiga    5. Cerebrovascular disease  I67.9 amiodarone  (PACERONE ) 200 MG tablet    dapagliflozin propanediol (FARXIGA) 10 MG TABS tablet    6. Acute ischemic right MCA stroke (HCC)  I63.511 amiodarone  (PACERONE ) 200 MG tablet    dapagliflozin propanediol (FARXIGA) 10 MG TABS tablet    7. Atrial fibrillation, currently in sinus rhythm  Z86.79 amiodarone  (PACERONE ) 200 MG tablet  dapagliflozin propanediol (FARXIGA) 10 MG TABS tablet   Holter has episodes of afin, not sinus tachycardia 118/min, advise stoping asp, start eliquis  and amiodronel. Had 2 CVA., high ITALY    8. Fibromyalgia  M79.7  pantoprazole  (PROTONIX ) 40 MG tablet    dapagliflozin propanediol (FARXIGA) 10 MG TABS tablet    9. CHF (congestive heart failure), NYHA class III, acute on chronic, diastolic (HCC)  I50.33    has diastolic dysfunction, add farxiga       Problem List Items Addressed This Visit       Cardiovascular and Mediastinum   Essential hypertension, benign   Relevant Medications   amiodarone  (PACERONE ) 200 MG tablet   pantoprazole  (PROTONIX ) 40 MG tablet   dapagliflozin propanediol (FARXIGA) 10 MG TABS tablet   Carotid artery stenosis - Primary   Relevant Medications   amiodarone  (PACERONE ) 200 MG tablet   dapagliflozin propanediol (FARXIGA) 10 MG TABS tablet   Cerebrovascular disease   Relevant Medications   amiodarone  (PACERONE ) 200 MG tablet   dapagliflozin propanediol (FARXIGA) 10 MG TABS tablet     Other   Hyperlipidemia   Relevant Medications   amiodarone  (PACERONE ) 200 MG tablet   pantoprazole  (PROTONIX ) 40 MG tablet   dapagliflozin propanediol (FARXIGA) 10 MG TABS tablet   Fibromyalgia   Relevant Medications   pantoprazole  (PROTONIX ) 40 MG tablet   dapagliflozin propanediol (FARXIGA) 10 MG TABS tablet   Palpitations   Relevant Medications   dapagliflozin propanediol (FARXIGA) 10 MG TABS tablet   Other Visit Diagnoses       Acute ischemic right MCA stroke (HCC)       Relevant Medications   amiodarone  (PACERONE ) 200 MG tablet   dapagliflozin propanediol (FARXIGA) 10 MG TABS tablet     Atrial fibrillation, currently in sinus rhythm       Holter has episodes of afin, not sinus tachycardia 118/min, advise stoping asp, start eliquis  and amiodronel. Had 2 CVA., high ITALY   Relevant Medications   amiodarone  (PACERONE ) 200 MG tablet   dapagliflozin propanediol (FARXIGA) 10 MG TABS tablet     CHF (congestive heart failure), NYHA class III, acute on chronic, diastolic (HCC)       has diastolic dysfunction, add farxiga   Relevant Medications   amiodarone  (PACERONE ) 200 MG  tablet          Disposition:   Return in about 2 months (around 07/10/2024).    Total time spent: 40 minutes  Signed,  Denyse Bathe, MD  05/09/2024 3:39 PM    [facility]

## 2024-05-10 ENCOUNTER — Other Ambulatory Visit: Payer: Self-pay | Admitting: Family

## 2024-05-10 ENCOUNTER — Other Ambulatory Visit: Payer: Self-pay | Admitting: Cardiovascular Disease

## 2024-05-10 DIAGNOSIS — I10 Essential (primary) hypertension: Secondary | ICD-10-CM

## 2024-05-14 ENCOUNTER — Encounter: Payer: Self-pay | Admitting: Family

## 2024-05-14 ENCOUNTER — Ambulatory Visit (INDEPENDENT_AMBULATORY_CARE_PROVIDER_SITE_OTHER): Admitting: Family

## 2024-05-14 VITALS — BP 119/77 | HR 67 | Ht 61.0 in | Wt 231.6 lb

## 2024-05-14 DIAGNOSIS — Z6841 Body Mass Index (BMI) 40.0 and over, adult: Secondary | ICD-10-CM

## 2024-05-14 DIAGNOSIS — Z780 Asymptomatic menopausal state: Secondary | ICD-10-CM | POA: Diagnosis not present

## 2024-05-14 DIAGNOSIS — F331 Major depressive disorder, recurrent, moderate: Secondary | ICD-10-CM | POA: Diagnosis not present

## 2024-05-14 DIAGNOSIS — E1165 Type 2 diabetes mellitus with hyperglycemia: Secondary | ICD-10-CM

## 2024-05-14 DIAGNOSIS — I10 Essential (primary) hypertension: Secondary | ICD-10-CM | POA: Diagnosis not present

## 2024-05-14 DIAGNOSIS — E782 Mixed hyperlipidemia: Secondary | ICD-10-CM | POA: Diagnosis not present

## 2024-05-14 MED ORDER — PANTOPRAZOLE SODIUM 40 MG PO TBEC: 40.0000 mg | DELAYED_RELEASE_TABLET | Freq: Every day | ORAL | 1 refills | Status: AC

## 2024-05-14 MED ORDER — VORTIOXETINE HBR 10 MG PO TABS: 10.0000 mg | ORAL_TABLET | Freq: Every day | ORAL | 1 refills | Status: AC

## 2024-05-14 MED ORDER — GABAPENTIN 600 MG PO TABS: ORAL_TABLET | ORAL | 0 refills | Status: DC

## 2024-05-14 NOTE — Progress Notes (Signed)
 Established Patient Office Visit  Subjective:  Patient ID: Terri Wood, female    DOB: 18-Jul-1966  Age: 58 y.o. MRN: [id number]  Chief Complaint  Patient presents with   Follow-up    2 month follow     Patient is here today for her 2 months follow up.  She has been feeling poorly since last appointment.   She does have additional concerns to discuss today.  She has been much more irritable, is continuing to be snippy.  She also reports that she has been having continues issues with her hot flashes.  Labs are not due today.  She needs refills.   I have reviewed her active problem list, medication list, allergies, notes from last encounter, lab results for her appointment today.      No other concerns at this time.   Past Medical History:  Diagnosis Date   Acute ischemic right MCA stroke (HCC) 08/15/2019   Acute respiratory failure (HCC) 01/16/2021   a.) in setting of CAP   Anxiety    Aortic atherosclerosis (HCC)    Atrial fibrillation (HCC)    a.) CHA2DS2-VASc = 5 (sex, HTN, CVA x2, vascular disease history) as of 08/20/2023; b.) cardiac rate/rhythm maintained on oral amiodarone  + metoprolol ; chronically anticoagulated using rivaroxaban ; on chronic clopidogrel    Avascular necrosis of left femoral head (HCC) 04/09/2018   CAD (coronary artery disease) 03/13/2023   a.) MV 03/13/2023: small mild reversible bas/mid anterosep/inferosep/apex defects c/w isch; b.) cCTA 03/29/2023: Ca2+ = 673.3 (99th %'ile)   Carotid artery disease (HCC) 08/16/2019   a.) doppler 08/16/2019: 40-59% LICA   Chronic midline low back pain with right-sided sciatica 02/11/2016   Community acquired pneumonia 01/16/2021   Depression    Essential hypertension    Fibromyalgia    Foraminal stenosis of lumbar region 02/27/2018   GERD (gastroesophageal reflux disease)    Long term current use of amiodarone     Lumbar radiculopathy 12/15/2016   Mixed hyperlipidemia    Multifocal pneumonia 06/05/2021    OAB (overactive bladder)    On chronic clopidogrel  therapy    On rivaroxaban  therapy    Osteoarthritis of left hip 2019   Restless leg syndrome 02/02/2015   Rotator cuff tendinitis, right 06/04/2023   Sepsis (HCC) 01/16/2021   T2DM (type 2 diabetes mellitus) (HCC)    Tendinitis of upper biceps tendon of right shoulder 06/04/2023   Traumatic complete tear of right rotator cuff 06/04/2023    Past Surgical History:  Procedure Laterality Date   ABDOMINAL SURGERY  1996   gastric bypass; stapling; surgilite   GASTRIC BYPASS OPEN  1996   PLANTAR FASCIA SURGERY Right 2009   REVERSE SHOULDER ARTHROPLASTY Right 08/23/2023   Procedure: REVERSE SHOULDER ARTHROPLASTY WITH BICEPS TENODESIS;  Surgeon: Edie Norleen JINNY, MD;  Location: [facility];  Service: Orthopedics;  Laterality: Right;   TARSAL TUNNEL RELEASE Right 2009   TOTAL HIP ARTHROPLASTY Left 04/09/2018   Procedure: TOTAL HIP ARTHROPLASTY ANTERIOR APPROACH;  Surgeon: Kathlynn Sharper, MD;  Location: [facility];  Service: Orthopedics;  Laterality: Left;   TOTAL KNEE ARTHROPLASTY Right 07/12/2021   Procedure: TOTAL KNEE ARTHROPLASTY;  Surgeon: Kathlynn Sharper, MD;  Location: [facility];  Service: Orthopedics;  Laterality: Right;   TOTAL KNEE ARTHROPLASTY Left 02/16/2022   Procedure: TOTAL KNEE ARTHROPLASTY;  Surgeon: Kathlynn Sharper, MD;  Location: [facility];  Service: Orthopedics;  Laterality: Left;    Social History   Socioeconomic History   Marital status: Legally Separated    Spouse name:  Not on file   Number of children: 0   Years of education: Not on file   Highest education level: Not on file  Occupational History   Not on file  Tobacco Use   Smoking status: Former    Current packs/day: 0.00    Types: Cigarettes    Quit date: 07/2019    Years since quitting: 4.8   Smokeless tobacco: Never  Vaping Use   Vaping status: Never Used  Substance and Sexual Activity   Alcohol use: Not Currently   Drug use: Not Currently    Types: Marijuana     Comment: none since 2019   Sexual activity: Not Currently  Other Topics Concern   Not on file  Social History Narrative   Lives with mother   Social Drivers of Health   Financial Resource Strain: Low Risk  (01/16/2022)   Received from [facility]   Overall Financial Resource Strain (CARDIA)    Difficulty of Paying Living Expenses: Not very hard  Food Insecurity: Unknown (01/16/2022)   Received from [facility]   Hunger Vital Sign    Within the past 12 months, you worried that your food would run out before you got the money to buy more.: Never true    Ran Out of Food in the Last Year: Not on file  Transportation Needs: No Transportation Needs (01/16/2022)   Received from [facility] - Transportation    In the past 12 months, has lack of transportation kept you from medical appointments or from getting medications?: No    Lack of Transportation (Non-Medical): No  Physical Activity: Inactive (01/16/2022)   Received from [facility]   Exercise Vital Sign    On average, how many days per week do you engage in moderate to strenuous exercise (like a brisk walk)?: 0 days    On average, how many minutes do you engage in exercise at this level?: 0 min  Stress: No Stress Concern Present (01/16/2022)   Received from [facility] - Occupational Stress Questionnaire    Feeling of Stress : Not at all  Social Connections: Moderately Integrated (01/16/2022)   Received from [facility]   Social Connection and Isolation Panel    In a typical week, how many times do you talk on the phone with family, friends, or neighbors?: More than three times a week    How often do you get together with friends or relatives?: Never    How often do you attend church or religious services?: More than 4 times per year    Do you belong to any clubs or organizations  such as church groups, unions, fraternal or athletic groups, or school groups?: Yes    How often do you attend meetings of the clubs or organizations you belong to?: More than 4 times per year    Are you married, widowed, divorced, separated, never married, or living with a partner?: Separated  Intimate Partner Violence: Not on file    Family History  Problem Relation Age of Onset   Aneurysm Mother    Heart disease Father     Allergies  Allergen Reactions   Penicillins Hives, Other (See Comments) and Rash    TOLERATED ROCEPHIN  AND CEFAZOLIN   Did it involve swelling of the face/tongue/throat, SOB, or low BP? Yes  Did it involve sudden or severe rash/hives, skin peeling, or any  reaction on the inside of your mouth or nose? No  Did you need to seek medical attention at a hospital or doctor's office? No  When did it last happen? Within the past 10 years   If all above answers are NO, may proceed with cephalosporin use.  Did it involve swelling of the face/tongue/throat, SOB, or low BP? Yes  Did it involve sudden or severe rash/hives, skin peeling, or any reaction on the inside of your mouth or nose? No  Did you need to seek medical attention at a hospital or doctor's office? No  When did it last happen? Within the past 10 years   If all above answers are "NO", may proceed with cephalosporin use.  Has patient had a PCN reaction causing immediate rash, facial/tongue/throat swelling, SOB or lightheadedness with hypotension: Yes  Has patient had a PCN reaction causing severe rash involving mucus membranes or skin necrosis: No  Has patient had a PCN reaction that required hospitalization: No  Has patient had a PCN reaction occurring within the last 10 years: Yes  If all of the above answers are NO, then may proceed with Cephalosporin use.  Did it involve swelling of the face/tongue/throat, SOB, or low BP? Yes    Did it involve sudden or severe rash/hives, skin peeling, or any  reaction on the inside of your mouth or nose? No    Did you need to seek medical attention at a hospital or doctor's office? No    When did it last happen? Within the past 10 years     If all above answers are "NO", may proceed with cephalosporin use.    Has patient had a PCN reaction causing immediate rash, facial/tongue/throat swelling, SOB or lightheadedness with hypotension: Yes    Has patient had a PCN reaction causing severe rash involving mucus membranes or skin necrosis: No    Has patient had a PCN reaction that required hospitalization: No    Has patient had a PCN reaction occurring within the last 10 years: Yes    If all of the above answers are NO, then may proceed with Cephalosporin use.    TOLERATED ROCEPHIN  AND CEFAZOLIN   Did it involve swelling of the face/tongue/throat, SOB, or low BP? Yes  Did it involve sudden or severe rash/hives, skin peeling, or any reaction on the inside of your mouth or nose? No  Did you need to seek medical attention at a hospital or doctor's office? No  When did it last happen? Within the past 10 years   If all above answers are NO, may proceed with cephalosporin use.  Did it involve swelling of the face/tongue/throat, SOB, or low BP? Yes Did it involve sudden or severe rash/hives, skin peeling, or any reaction on the inside of your mouth or nose? No Did you need to seek medical attention at a hospital or docto... (TRUNCATED)   Levofloxacin  Other (See Comments)    Dizziness   Oxycodone  Itching   Veozah  [Fezolinetant ] Rash   Vilazodone Other (See Comments)    unknown    Review of Systems  All other systems reviewed and are negative.      Objective:   BP 119/77   Pulse 67   Ht 5' 1 (1.549 m)   Wt 231 lb 9.6 oz (105.1 kg)   LMP 04/01/2018 (Exact Date)   SpO2 94%   BMI 43.76 kg/m   Vitals:   05/14/24 1050  BP: 119/77  Pulse: 67  Height: 5' 1 (1.549  m)  Weight: 231 lb 9.6 oz (105.1 kg)  SpO2: 94%  BMI (Calculated): 43.78     Physical Exam Vitals and nursing note reviewed.  Constitutional:      Appearance: Normal appearance. She is normal weight.  HENT:     Head: Normocephalic.  Eyes:     Extraocular Movements: Extraocular movements intact.     Conjunctiva/sclera: Conjunctivae normal.     Pupils: Pupils are equal, round, and reactive to light.  Cardiovascular:     Rate and Rhythm: Normal rate.  Pulmonary:     Effort: Pulmonary effort is normal.  Neurological:     General: No focal deficit present.     Mental Status: She is alert and oriented to person, place, and time. Mental status is at baseline.  Psychiatric:        Mood and Affect: Mood normal.        Behavior: Behavior normal.        Thought Content: Thought content normal.        Judgment: Judgment normal.      No results found for any visits on 05/14/24.  Recent Results (from the past 2160 hours)  POC CREATINE & ALBUMIN,URINE     Status: None   Collection Time: 02/26/24  1:49 PM  Result Value Ref Range   Microalbumin Ur, POC 80 mg/L   Creatinine, POC 300 mg/dL   Albumin/Creatinine Ratio, Urine, POC 30-300        Assessment & Plan Essential hypertension, benign Blood pressure well controlled with current medications.  Continue current therapy.  Will reassess at follow up.   Mixed hyperlipidemia Continue current therapy for lipid control. Will modify as needed based on labwork results.   Body mass index 40.0-44.9, adult (HCC) Continue current meds.  Will adjust as needed based on results.  The patient is asked to make an attempt to improve diet and exercise patterns to aid in medical management of this problem. Addressed importance of increasing and maintaining water intake.   Type 2 diabetes mellitus with hyperglycemia, without long-term current use of insulin  (HCC) Checking labs today. Will call pt. With results  Continue current diabetes POC, as patient has been well controlled on current regimen.  Will adjust meds  if needed based on labs.   -CBC w/Diff -CMP w/eGFR -Hemoglobin A1C  Major depressive disorder, recurrent episode, moderate (HCC) Increase trintellix  dosing.  Will Reassess at follow up Menopause Increase Gabapentin  to add an extra dose to help with her hot flashes.  She cannot use MHT due hx of a stroke.      Return in about 1 month (around 06/14/2024).   Total time spent: 20 minutes  ALAN CHRISTELLA ARRANT, FNP  05/14/2024   This document may have been prepared by [facility] Voice Recognition software and as such may include unintentional dictation errors.

## 2024-05-14 NOTE — Assessment & Plan Note (Signed)
 Blood pressure well controlled with current medications.  Continue current therapy.  Will reassess at follow up.

## 2024-05-14 NOTE — Patient Instructions (Signed)
 Increase Trintellix  to 10 mg - new prescription has been sent to pharmacy.   Gabapentin : 1 in the morning, one in the afternoon, 2 at bedtime.

## 2024-05-14 NOTE — Assessment & Plan Note (Signed)
 Increase trintellix  dosing.  Will Reassess at follow up

## 2024-05-14 NOTE — Assessment & Plan Note (Signed)
 Checking labs today. Will call pt. With results  Continue current diabetes POC, as patient has been well controlled on current regimen.  Will adjust meds if needed based on labs.   -CBC w/Diff -CMP w/eGFR -Hemoglobin A1C

## 2024-05-14 NOTE — Assessment & Plan Note (Signed)
 Continue current meds.  Will adjust as needed based on results.  The patient is asked to make an attempt to improve diet and exercise patterns to aid in medical management of this problem. Addressed importance of increasing and maintaining water intake.

## 2024-05-14 NOTE — Assessment & Plan Note (Signed)
 Continue current therapy for lipid control. Will modify as needed based on labwork results.

## 2024-05-16 DIAGNOSIS — G4733 Obstructive sleep apnea (adult) (pediatric): Secondary | ICD-10-CM | POA: Diagnosis not present

## 2024-05-25 ENCOUNTER — Encounter: Payer: Self-pay | Admitting: Family

## 2024-05-25 NOTE — Assessment & Plan Note (Signed)
 Adding Vraylar to patient's meds.  She will let me know if this is effective for her or not.  Reassess at follow up.

## 2024-05-25 NOTE — Assessment & Plan Note (Signed)
 Continue current diabetes POC, as patient has been well controlled on current regimen.  Will adjust meds if needed based on labs.

## 2024-05-25 NOTE — Progress Notes (Signed)
 Established Patient Office Visit  Subjective:  Patient ID: Terri Wood, female    DOB: Mar 27, 1966  Age: 58 y.o. MRN: [id number]  Chief Complaint  Patient presents with   Follow-up    1 month follow up    Patient is here today for her 1 month follow up.  She has been feeling about the same since last appointment.   She does have additional concerns to discuss today.  Says her mood has improved some, but that she is still very irritable.   Labs are not due today.  She needs refills.   I have reviewed her active problem list, medication list, allergies, notes from last encounter, lab results for her appointment today.      No other concerns at this time.   Past Medical History:  Diagnosis Date   Acute ischemic right MCA stroke (HCC) 08/15/2019   Acute respiratory failure (HCC) 01/16/2021   a.) in setting of CAP   Anxiety    Aortic atherosclerosis (HCC)    Atrial fibrillation (HCC)    a.) CHA2DS2-VASc = 5 (sex, HTN, CVA x2, vascular disease history) as of 08/20/2023; b.) cardiac rate/rhythm maintained on oral amiodarone  + metoprolol ; chronically anticoagulated using rivaroxaban ; on chronic clopidogrel    Avascular necrosis of left femoral head (HCC) 04/09/2018   CAD (coronary artery disease) 03/13/2023   a.) MV 03/13/2023: small mild reversible bas/mid anterosep/inferosep/apex defects c/w isch; b.) cCTA 03/29/2023: Ca2+ = 673.3 (99th %'ile)   Carotid artery disease (HCC) 08/16/2019   a.) doppler 08/16/2019: 40-59% LICA   Chronic midline low back pain with right-sided sciatica 02/11/2016   Community acquired pneumonia 01/16/2021   Depression    Essential hypertension    Fibromyalgia    Foraminal stenosis of lumbar region 02/27/2018   GERD (gastroesophageal reflux disease)    Long term current use of amiodarone     Lumbar radiculopathy 12/15/2016   Mixed hyperlipidemia    Multifocal pneumonia 06/05/2021   OAB (overactive bladder)    On chronic clopidogrel  therapy     On rivaroxaban  therapy    Osteoarthritis of left hip 2019   Restless leg syndrome 02/02/2015   Rotator cuff tendinitis, right 06/04/2023   Sepsis (HCC) 01/16/2021   T2DM (type 2 diabetes mellitus) (HCC)    Tendinitis of upper biceps tendon of right shoulder 06/04/2023   Traumatic complete tear of right rotator cuff 06/04/2023    Past Surgical History:  Procedure Laterality Date   ABDOMINAL SURGERY  1996   gastric bypass; stapling; surgilite   GASTRIC BYPASS OPEN  1996   PLANTAR FASCIA SURGERY Right 2009   REVERSE SHOULDER ARTHROPLASTY Right 08/23/2023   Procedure: REVERSE SHOULDER ARTHROPLASTY WITH BICEPS TENODESIS;  Surgeon: Edie Norleen JINNY, MD;  Location: [facility];  Service: Orthopedics;  Laterality: Right;   TARSAL TUNNEL RELEASE Right 2009   TOTAL HIP ARTHROPLASTY Left 04/09/2018   Procedure: TOTAL HIP ARTHROPLASTY ANTERIOR APPROACH;  Surgeon: Kathlynn Sharper, MD;  Location: [facility];  Service: Orthopedics;  Laterality: Left;   TOTAL KNEE ARTHROPLASTY Right 07/12/2021   Procedure: TOTAL KNEE ARTHROPLASTY;  Surgeon: Kathlynn Sharper, MD;  Location: [facility];  Service: Orthopedics;  Laterality: Right;   TOTAL KNEE ARTHROPLASTY Left 02/16/2022   Procedure: TOTAL KNEE ARTHROPLASTY;  Surgeon: Kathlynn Sharper, MD;  Location: [facility];  Service: Orthopedics;  Laterality: Left;    Social History   Socioeconomic History   Marital status: Legally Separated    Spouse name: Not on file   Number of children: 0  Years of education: Not on file   Highest education level: Not on file  Occupational History   Not on file  Tobacco Use   Smoking status: Former    Current packs/day: 0.00    Types: Cigarettes    Quit date: 07/2019    Years since quitting: 4.8   Smokeless tobacco: Never  Vaping Use   Vaping status: Never Used  Substance and Sexual Activity   Alcohol use: Not Currently   Drug use: Not Currently    Types: Marijuana    Comment: none since 2019   Sexual activity: Not Currently   Other Topics Concern   Not on file  Social History Narrative   Lives with mother   Social Drivers of Health   Financial Resource Strain: Low Risk  (01/16/2022)   Received from [facility]   Overall Financial Resource Strain (CARDIA)    Difficulty of Paying Living Expenses: Not very hard  Food Insecurity: Unknown (01/16/2022)   Received from [facility]   Hunger Vital Sign    Within the past 12 months, you worried that your food would run out before you got the money to buy more.: Never true    Ran Out of Food in the Last Year: Not on file  Transportation Needs: No Transportation Needs (01/16/2022)   Received from [facility] - Transportation    In the past 12 months, has lack of transportation kept you from medical appointments or from getting medications?: No    Lack of Transportation (Non-Medical): No  Physical Activity: Inactive (01/16/2022)   Received from [facility]   Exercise Vital Sign    On average, how many days per week do you engage in moderate to strenuous exercise (like a brisk walk)?: 0 days    On average, how many minutes do you engage in exercise at this level?: 0 min  Stress: No Stress Concern Present (01/16/2022)   Received from [facility] - Occupational Stress Questionnaire    Feeling of Stress : Not at all  Social Connections: Moderately Integrated (01/16/2022)   Received from [facility]   Social Connection and Isolation Panel    In a typical week, how many times do you talk on the phone with family, friends, or neighbors?: More than three times a week    How often do you get together with friends or relatives?: Never    How often do you attend church or religious services?: More than 4 times per year    Do you belong to any clubs or organizations such as church groups, unions, fraternal or athletic groups, or  school groups?: Yes    How often do you attend meetings of the clubs or organizations you belong to?: More than 4 times per year    Are you married, widowed, divorced, separated, never married, or living with a partner?: Separated  Intimate Partner Violence: Not on file    Family History  Problem Relation Age of Onset   Aneurysm Mother    Heart disease Father     Allergies  Allergen Reactions   Penicillins Hives, Other (See Comments) and Rash    TOLERATED ROCEPHIN  AND CEFAZOLIN   Did it involve swelling of the face/tongue/throat, SOB, or low BP? Yes  Did it involve sudden or severe rash/hives, skin peeling, or any reaction on the inside of your mouth or nose? No  Did you need to seek medical attention at a hospital or doctor's office? No  When did it last happen? Within the past 10 years   If all above answers are NO, may proceed with cephalosporin use.  Did it involve swelling of the face/tongue/throat, SOB, or low BP? Yes  Did it involve sudden or severe rash/hives, skin peeling, or any reaction on the inside of your mouth or nose? No  Did you need to seek medical attention at a hospital or doctor's office? No  When did it last happen? Within the past 10 years   If all above answers are "NO", may proceed with cephalosporin use.  Has patient had a PCN reaction causing immediate rash, facial/tongue/throat swelling, SOB or lightheadedness with hypotension: Yes  Has patient had a PCN reaction causing severe rash involving mucus membranes or skin necrosis: No  Has patient had a PCN reaction that required hospitalization: No  Has patient had a PCN reaction occurring within the last 10 years: Yes  If all of the above answers are NO, then may proceed with Cephalosporin use.  Did it involve swelling of the face/tongue/throat, SOB, or low BP? Yes    Did it involve sudden or severe rash/hives, skin peeling, or any reaction on the inside of your mouth or nose? No    Did you need to  seek medical attention at a hospital or doctor's office? No    When did it last happen? Within the past 10 years     If all above answers are "NO", may proceed with cephalosporin use.    Has patient had a PCN reaction causing immediate rash, facial/tongue/throat swelling, SOB or lightheadedness with hypotension: Yes    Has patient had a PCN reaction causing severe rash involving mucus membranes or skin necrosis: No    Has patient had a PCN reaction that required hospitalization: No    Has patient had a PCN reaction occurring within the last 10 years: Yes    If all of the above answers are NO, then may proceed with Cephalosporin use.    TOLERATED ROCEPHIN  AND CEFAZOLIN   Did it involve swelling of the face/tongue/throat, SOB, or low BP? Yes  Did it involve sudden or severe rash/hives, skin peeling, or any reaction on the inside of your mouth or nose? No  Did you need to seek medical attention at a hospital or doctor's office? No  When did it last happen? Within the past 10 years   If all above answers are NO, may proceed with cephalosporin use.  Did it involve swelling of the face/tongue/throat, SOB, or low BP? Yes Did it involve sudden or severe rash/hives, skin peeling, or any reaction on the inside of your mouth or nose? No Did you need to seek medical attention at a hospital or docto... (TRUNCATED)   Levofloxacin  Other (See Comments)    Dizziness   Oxycodone  Itching   Veozah  [Fezolinetant ] Rash   Vilazodone Other (See Comments)    unknown    Review of Systems  Psychiatric/Behavioral:  The patient is nervous/anxious.   All other systems reviewed and are negative.      Objective:   BP 124/68   Pulse 73   Ht 5' 1 (1.549 m)   Wt 226 lb 12.8 oz (102.9 kg)   LMP 04/01/2018 (Exact Date)   SpO2 95%   BMI 42.85 kg/m   Vitals:   02/26/24 1256  BP: 124/68  Pulse: 73  Height: 5' 1 (1.549 m)  Weight:  226 lb 12.8 oz (102.9 kg)  SpO2: 95%  BMI (Calculated): 42.88     Physical Exam Vitals and nursing note reviewed.  Constitutional:      Appearance: Normal appearance. She is normal weight.  HENT:     Head: Normocephalic.  Eyes:     Extraocular Movements: Extraocular movements intact.     Conjunctiva/sclera: Conjunctivae normal.     Pupils: Pupils are equal, round, and reactive to light.  Cardiovascular:     Rate and Rhythm: Normal rate.  Pulmonary:     Effort: Pulmonary effort is normal.  Neurological:     General: No focal deficit present.     Mental Status: She is alert and oriented to person, place, and time. Mental status is at baseline.  Psychiatric:        Mood and Affect: Mood normal.        Behavior: Behavior normal.        Thought Content: Thought content normal.        Judgment: Judgment normal.      Results for orders placed or performed in visit on 02/26/24  POC CREATINE & ALBUMIN,URINE  Result Value Ref Range   Microalbumin Ur, POC 80 mg/L   Creatinine, POC 300 mg/dL   Albumin/Creatinine Ratio, Urine, POC 30-300     Recent Results (from the past 2160 hours)  POC CREATINE & ALBUMIN,URINE     Status: None   Collection Time: 02/26/24  1:49 PM  Result Value Ref Range   Microalbumin Ur, POC 80 mg/L   Creatinine, POC 300 mg/dL   Albumin/Creatinine Ratio, Urine, POC 30-300        Assessment & Plan Type 2 diabetes mellitus with hyperglycemia, without long-term current use of insulin  (HCC) Continue current diabetes POC, as patient has been well controlled on current regimen.  Will adjust meds if needed based on labs.   Major depressive disorder, recurrent episode, moderate (HCC) Generalized anxiety disorder Adding Vraylar to patient's meds.  She will let me know if this is effective for her or not.  Reassess at follow up.      Return in about 3 months (around 05/28/2024).   Total time spent: 20 minutes  ALAN CHRISTELLA ARRANT, FNP  02/26/2024   This document may have been prepared by [facility] Voice Recognition  software and as such may include unintentional dictation errors.

## 2024-05-28 ENCOUNTER — Other Ambulatory Visit: Payer: Self-pay

## 2024-06-11 ENCOUNTER — Telehealth: Payer: Self-pay | Admitting: Family

## 2024-06-11 NOTE — Telephone Encounter (Signed)
 Patient left VM stating she believes that she has bronchitis. Called patient back and she says she is having a lot of head and chest congestion. She is experiencing a cough as well. Wanted to know if she needs to be seen. Advised her to take a Covid test first and if it is negative then to call in the morning and get an appt with Amber. Patient verbalized understanding.

## 2024-06-12 ENCOUNTER — Other Ambulatory Visit: Payer: Self-pay | Admitting: Internal Medicine

## 2024-06-12 ENCOUNTER — Telehealth: Payer: Self-pay

## 2024-06-12 DIAGNOSIS — U071 COVID-19: Secondary | ICD-10-CM

## 2024-06-12 MED ORDER — MOLNUPIRAVIR EUA 200MG CAPSULE
4.0000 | ORAL_CAPSULE | Freq: Two times a day (BID) | ORAL | 0 refills | Status: AC
Start: 2024-06-12 — End: 2024-06-17

## 2024-06-12 NOTE — Telephone Encounter (Signed)
Pt informed

## 2024-06-12 NOTE — Telephone Encounter (Signed)
 Patient called again and said she cannot remember which Covid medicine it is she cannot take but she had a reaction to it before a long time ago. Looks like she was prescribed Paxlovid in 2022. She said she is willing to try the Paxlovid again. She doesn't remember what kind of reaction she had before. Please advise and if okay, send Rx to pharmacy.   [facility] [place]

## 2024-06-12 NOTE — Telephone Encounter (Signed)
 Pt called stating she took an at home covid test last night & it was positive. Symptoms started yesterday: chest congestion, cough, sore throat, headache/body aches, fever. She asked if we can send rx for her? Also mentioned that she can't take one of the meds for covid, please advise

## 2024-06-18 ENCOUNTER — Ambulatory Visit (INDEPENDENT_AMBULATORY_CARE_PROVIDER_SITE_OTHER): Admitting: Family

## 2024-06-18 ENCOUNTER — Encounter: Payer: Self-pay | Admitting: Family

## 2024-06-18 VITALS — BP 112/72 | HR 61 | Ht 61.0 in | Wt 229.0 lb

## 2024-06-18 DIAGNOSIS — F331 Major depressive disorder, recurrent, moderate: Secondary | ICD-10-CM | POA: Diagnosis not present

## 2024-06-18 DIAGNOSIS — F411 Generalized anxiety disorder: Secondary | ICD-10-CM | POA: Diagnosis not present

## 2024-06-18 DIAGNOSIS — R051 Acute cough: Secondary | ICD-10-CM | POA: Diagnosis not present

## 2024-06-19 ENCOUNTER — Telehealth: Payer: Self-pay

## 2024-06-19 NOTE — Telephone Encounter (Signed)
 Patient has called again this afternoon for the cough medication.

## 2024-06-19 NOTE — Telephone Encounter (Signed)
 Patient LM asking about a cough medication that you are supposed to be sending in for her and a 3mg  medication but didn't state what it was? She said the pharmacy doesn't have either one

## 2024-06-20 ENCOUNTER — Other Ambulatory Visit: Payer: Self-pay

## 2024-06-20 ENCOUNTER — Ambulatory Visit: Admitting: Cardiovascular Disease

## 2024-06-20 MED ORDER — CARIPRAZINE HCL 3 MG PO CAPS: 3.0000 mg | ORAL_CAPSULE | Freq: Every day | ORAL | 3 refills | Status: DC

## 2024-06-20 MED ORDER — BENZONATATE 100 MG PO CAPS: 100.0000 mg | ORAL_CAPSULE | Freq: Three times a day (TID) | ORAL | 1 refills | Status: DC | PRN

## 2024-06-20 NOTE — Telephone Encounter (Signed)
 Rx sent to pharmacy per [facility] verbal

## 2024-06-21 ENCOUNTER — Other Ambulatory Visit: Payer: Self-pay | Admitting: Cardiovascular Disease

## 2024-06-21 DIAGNOSIS — I63511 Cerebral infarction due to unspecified occlusion or stenosis of right middle cerebral artery: Secondary | ICD-10-CM

## 2024-06-21 DIAGNOSIS — I10 Essential (primary) hypertension: Secondary | ICD-10-CM

## 2024-06-21 DIAGNOSIS — Z8679 Personal history of other diseases of the circulatory system: Secondary | ICD-10-CM

## 2024-06-21 DIAGNOSIS — I6523 Occlusion and stenosis of bilateral carotid arteries: Secondary | ICD-10-CM

## 2024-06-21 DIAGNOSIS — E1165 Type 2 diabetes mellitus with hyperglycemia: Secondary | ICD-10-CM

## 2024-06-21 DIAGNOSIS — E782 Mixed hyperlipidemia: Secondary | ICD-10-CM

## 2024-06-21 DIAGNOSIS — R42 Dizziness and giddiness: Secondary | ICD-10-CM

## 2024-06-22 ENCOUNTER — Encounter: Payer: Self-pay | Admitting: Family

## 2024-06-22 NOTE — Assessment & Plan Note (Signed)
 Patient stable.  Well controlled with current therapy.  Will send refills  Continue current meds.

## 2024-06-22 NOTE — Progress Notes (Signed)
 Established Patient Office Visit  Subjective:  Patient ID: Terri Wood, female    DOB: January 25, 1966  Age: 58 y.o. MRN: [id number]  Chief Complaint  Patient presents with   Follow-up    1 month follow up    Patient is here today for her 3 months follow up.  She has been feeling fairly well since last appointment.   She does have additional concerns to discuss today.  She was diagnosed with COVID last week, still having some cough and congestion. Asks if we can send something for her her cough.  Also asks if we can send RX for her vraylar .   Labs are not due today.  She needs refills.   I have reviewed her active problem list, medication list, allergies, notes from last encounter, lab results for her appointment today.      No other concerns at this time.   Past Medical History:  Diagnosis Date   Acute ischemic right MCA stroke (HCC) 08/15/2019   Acute respiratory failure (HCC) 01/16/2021   a.) in setting of CAP   Anxiety    Aortic atherosclerosis (HCC)    Atrial fibrillation (HCC)    a.) CHA2DS2-VASc = 5 (sex, HTN, CVA x2, vascular disease history) as of 08/20/2023; b.) cardiac rate/rhythm maintained on oral amiodarone  + metoprolol ; chronically anticoagulated using rivaroxaban ; on chronic clopidogrel    Avascular necrosis of left femoral head (HCC) 04/09/2018   CAD (coronary artery disease) 03/13/2023   a.) MV 03/13/2023: small mild reversible bas/mid anterosep/inferosep/apex defects c/w isch; b.) cCTA 03/29/2023: Ca2+ = 673.3 (99th %'ile)   Carotid artery disease (HCC) 08/16/2019   a.) doppler 08/16/2019: 40-59% LICA   Chronic midline low back pain with right-sided sciatica 02/11/2016   Community acquired pneumonia 01/16/2021   Depression    Essential hypertension    Fibromyalgia    Foraminal stenosis of lumbar region 02/27/2018   GERD (gastroesophageal reflux disease)    Long term current use of amiodarone     Lumbar radiculopathy 12/15/2016   Mixed hyperlipidemia     Multifocal pneumonia 06/05/2021   OAB (overactive bladder)    On chronic clopidogrel  therapy    On rivaroxaban  therapy    Osteoarthritis of left hip 2019   Restless leg syndrome 02/02/2015   Rotator cuff tendinitis, right 06/04/2023   Sepsis (HCC) 01/16/2021   T2DM (type 2 diabetes mellitus) (HCC)    Tendinitis of upper biceps tendon of right shoulder 06/04/2023   Traumatic complete tear of right rotator cuff 06/04/2023    Past Surgical History:  Procedure Laterality Date   ABDOMINAL SURGERY  1996   gastric bypass; stapling; surgilite   GASTRIC BYPASS OPEN  1996   PLANTAR FASCIA SURGERY Right 2009   REVERSE SHOULDER ARTHROPLASTY Right 08/23/2023   Procedure: REVERSE SHOULDER ARTHROPLASTY WITH BICEPS TENODESIS;  Surgeon: Edie Norleen JINNY, MD;  Location: [facility];  Service: Orthopedics;  Laterality: Right;   TARSAL TUNNEL RELEASE Right 2009   TOTAL HIP ARTHROPLASTY Left 04/09/2018   Procedure: TOTAL HIP ARTHROPLASTY ANTERIOR APPROACH;  Surgeon: Kathlynn Sharper, MD;  Location: [facility];  Service: Orthopedics;  Laterality: Left;   TOTAL KNEE ARTHROPLASTY Right 07/12/2021   Procedure: TOTAL KNEE ARTHROPLASTY;  Surgeon: Kathlynn Sharper, MD;  Location: [facility];  Service: Orthopedics;  Laterality: Right;   TOTAL KNEE ARTHROPLASTY Left 02/16/2022   Procedure: TOTAL KNEE ARTHROPLASTY;  Surgeon: Kathlynn Sharper, MD;  Location: [facility];  Service: Orthopedics;  Laterality: Left;    Social History   Socioeconomic History  Marital status: Legally Separated    Spouse name: Not on file   Number of children: 0   Years of education: Not on file   Highest education level: Not on file  Occupational History   Not on file  Tobacco Use   Smoking status: Former    Current packs/day: 0.00    Types: Cigarettes    Quit date: 07/2019    Years since quitting: 4.9   Smokeless tobacco: Never  Vaping Use   Vaping status: Never Used  Substance and Sexual Activity   Alcohol use: Not Currently   Drug  use: Not Currently    Types: Marijuana    Comment: none since 2019   Sexual activity: Not Currently  Other Topics Concern   Not on file  Social History Narrative   Lives with mother   Social Drivers of Health   Financial Resource Strain: Low Risk  (01/16/2022)   Received from [facility]   Overall Financial Resource Strain (CARDIA)    Difficulty of Paying Living Expenses: Not very hard  Food Insecurity: Unknown (01/16/2022)   Received from [facility]   Hunger Vital Sign    Within the past 12 months, you worried that your food would run out before you got the money to buy more.: Never true    Ran Out of Food in the Last Year: Not on file  Transportation Needs: No Transportation Needs (01/16/2022)   Received from [facility] - Transportation    In the past 12 months, has lack of transportation kept you from medical appointments or from getting medications?: No    Lack of Transportation (Non-Medical): No  Physical Activity: Inactive (01/16/2022)   Received from [facility]   Exercise Vital Sign    On average, how many days per week do you engage in moderate to strenuous exercise (like a brisk walk)?: 0 days    On average, how many minutes do you engage in exercise at this level?: 0 min  Stress: No Stress Concern Present (01/16/2022)   Received from [facility] - Occupational Stress Questionnaire    Feeling of Stress : Not at all  Social Connections: Moderately Integrated (01/16/2022)   Received from [facility]   Social Connection and Isolation Panel    In a typical week, how many times do you talk on the phone with family, friends, or neighbors?: More than three times a week    How often do you get together with friends or relatives?: Never    How often do you attend church or religious services?: More than 4 times per year    Do  you belong to any clubs or organizations such as church groups, unions, fraternal or athletic groups, or school groups?: Yes    How often do you attend meetings of the clubs or organizations you belong to?: More than 4 times per year    Are you married, widowed, divorced, separated, never married, or living with a partner?: Separated  Intimate Partner Violence: Not on file    Family History  Problem Relation Age of Onset   Aneurysm Mother    Heart disease Father     Allergies  Allergen Reactions   Penicillins Hives, Other (See Comments) and Rash    TOLERATED ROCEPHIN  AND CEFAZOLIN   Did it involve swelling of the face/tongue/throat, SOB, or low BP? Yes  Did it  involve sudden or severe rash/hives, skin peeling, or any reaction on the inside of your mouth or nose? No  Did you need to seek medical attention at a hospital or doctor's office? No  When did it last happen? Within the past 10 years   If all above answers are NO, may proceed with cephalosporin use.  Did it involve swelling of the face/tongue/throat, SOB, or low BP? Yes  Did it involve sudden or severe rash/hives, skin peeling, or any reaction on the inside of your mouth or nose? No  Did you need to seek medical attention at a hospital or doctor's office? No  When did it last happen? Within the past 10 years   If all above answers are "NO", may proceed with cephalosporin use.  Has patient had a PCN reaction causing immediate rash, facial/tongue/throat swelling, SOB or lightheadedness with hypotension: Yes  Has patient had a PCN reaction causing severe rash involving mucus membranes or skin necrosis: No  Has patient had a PCN reaction that required hospitalization: No  Has patient had a PCN reaction occurring within the last 10 years: Yes  If all of the above answers are NO, then may proceed with Cephalosporin use.  Did it involve swelling of the face/tongue/throat, SOB, or low BP? Yes    Did it involve sudden or  severe rash/hives, skin peeling, or any reaction on the inside of your mouth or nose? No    Did you need to seek medical attention at a hospital or doctor's office? No    When did it last happen? Within the past 10 years     If all above answers are "NO", may proceed with cephalosporin use.    Has patient had a PCN reaction causing immediate rash, facial/tongue/throat swelling, SOB or lightheadedness with hypotension: Yes    Has patient had a PCN reaction causing severe rash involving mucus membranes or skin necrosis: No    Has patient had a PCN reaction that required hospitalization: No    Has patient had a PCN reaction occurring within the last 10 years: Yes    If all of the above answers are NO, then may proceed with Cephalosporin use.    TOLERATED ROCEPHIN  AND CEFAZOLIN   Did it involve swelling of the face/tongue/throat, SOB, or low BP? Yes  Did it involve sudden or severe rash/hives, skin peeling, or any reaction on the inside of your mouth or nose? No  Did you need to seek medical attention at a hospital or doctor's office? No  When did it last happen? Within the past 10 years   If all above answers are NO, may proceed with cephalosporin use.  Did it involve swelling of the face/tongue/throat, SOB, or low BP? Yes Did it involve sudden or severe rash/hives, skin peeling, or any reaction on the inside of your mouth or nose? No Did you need to seek medical attention at a hospital or docto... (TRUNCATED)   Levofloxacin  Other (See Comments)    Dizziness   Oxycodone  Itching   Paxlovid [Nirmatrelvir-Ritonavir]    Veozah  [Fezolinetant ] Rash   Vilazodone Other (See Comments)    unknown    Review of Systems  Respiratory:  Positive for cough and sputum production.   All other systems reviewed and are negative.      Objective:   BP 112/72   Pulse 61   Ht 5' 1 (1.549 m)   Wt 229 lb (103.9 kg)   LMP 04/01/2018 (Exact Date)   SpO2 96%  BMI 43.27 kg/m   Vitals:   06/18/24  1106  BP: 112/72  Pulse: 61  Height: 5' 1 (1.549 m)  Weight: 229 lb (103.9 kg)  SpO2: 96%  BMI (Calculated): 43.29    Physical Exam Vitals and nursing note reviewed.  Constitutional:      Appearance: Normal appearance. She is normal weight.  HENT:     Head: Normocephalic.  Eyes:     Extraocular Movements: Extraocular movements intact.     Conjunctiva/sclera: Conjunctivae normal.     Pupils: Pupils are equal, round, and reactive to light.  Cardiovascular:     Rate and Rhythm: Normal rate.  Pulmonary:     Effort: Pulmonary effort is normal.     Breath sounds: Normal breath sounds.  Musculoskeletal:     Cervical back: Normal range of motion.  Neurological:     General: No focal deficit present.     Mental Status: She is alert and oriented to person, place, and time. Mental status is at baseline.  Psychiatric:        Mood and Affect: Mood normal.        Behavior: Behavior normal.        Thought Content: Thought content normal.        Judgment: Judgment normal.      No results found for any visits on 06/18/24.  No results found for this or any previous visit (from the past 2160 hours).     Assessment & Plan Major depressive disorder, recurrent episode, moderate (HCC) Generalized anxiety disorder Patient stable.  Well controlled with current therapy.  Will send refills  Continue current meds.   Acute cough Sending cough meds for patient.  Will let me know if this does not help with her symptoms.      No follow-ups on file.   Total time spent: 20 minutes  ALAN CHRISTELLA ARRANT, FNP  06/18/2024   This document may have been prepared by [facility] Voice Recognition software and as such may include unintentional dictation errors.

## 2024-06-23 ENCOUNTER — Telehealth: Payer: Self-pay | Admitting: Family

## 2024-06-23 NOTE — Telephone Encounter (Signed)
 Pt called stating that she was over the covid but still having severe chest congestion & dry cough, pt wanted to know if she needed to come in and be seen or if you could call her in something before it turns into pneumonia Please give pt a call back today to let her know

## 2024-06-23 NOTE — Telephone Encounter (Signed)
 Patient left VM c/o her head, chest, and cough worsening. Requesting we send in an antibiotic and something for the cough. Please advise.

## 2024-06-24 ENCOUNTER — Ambulatory Visit (INDEPENDENT_AMBULATORY_CARE_PROVIDER_SITE_OTHER): Admitting: Cardiology

## 2024-06-24 ENCOUNTER — Other Ambulatory Visit: Payer: Self-pay

## 2024-06-24 ENCOUNTER — Ambulatory Visit: Admitting: Family

## 2024-06-24 ENCOUNTER — Encounter: Payer: Self-pay | Admitting: Cardiology

## 2024-06-24 VITALS — BP 118/70 | HR 56 | Ht 61.0 in | Wt 227.6 lb

## 2024-06-24 DIAGNOSIS — Z0130 Encounter for examination of blood pressure without abnormal findings: Secondary | ICD-10-CM | POA: Diagnosis not present

## 2024-06-24 DIAGNOSIS — R5383 Other fatigue: Secondary | ICD-10-CM

## 2024-06-24 DIAGNOSIS — R051 Acute cough: Secondary | ICD-10-CM | POA: Diagnosis not present

## 2024-06-24 DIAGNOSIS — J22 Unspecified acute lower respiratory infection: Secondary | ICD-10-CM | POA: Insufficient documentation

## 2024-06-24 MED ORDER — METHYLPREDNISOLONE 4 MG PO TBPK
ORAL_TABLET | ORAL | 0 refills | Status: AC
  Filled 2024-06-24: qty 21, 6d supply, fill #0

## 2024-06-24 MED ORDER — AZITHROMYCIN 250 MG PO TABS
ORAL_TABLET | ORAL | 0 refills | Status: AC
  Filled 2024-06-24: qty 6, 5d supply, fill #0

## 2024-06-24 MED ORDER — ALBUTEROL SULFATE HFA 108 (90 BASE) MCG/ACT IN AERS
2.0000 | INHALATION_SPRAY | Freq: Four times a day (QID) | RESPIRATORY_TRACT | 2 refills | Status: AC | PRN
  Filled 2024-06-24: qty 6.7, 25d supply, fill #0

## 2024-06-24 NOTE — Telephone Encounter (Signed)
 Patient has appt today.

## 2024-06-24 NOTE — Progress Notes (Signed)
 Established Patient Office Visit  Subjective:  Patient ID: Terri Wood, female    DOB: 1966/02/21  Age: 58 y.o. MRN: [id number]  Chief Complaint  Patient presents with   Acute Visit    Head and chest pain,dry cough, fatigue, nauseous started last week.    Patient in office for an acute visit. Patient complaining of head and chest congestion, dry cough, fatigue that started last week. Patient also complaining of headache, runny nose, sinus pain, sneezing, and PND. Has been taking Nyquil, Mucinex, and Alkaseltzer with no relief. Has been using Tessalon  pearls with minimal relief. Will send in a Z-pack and a medrol  dose pack. Continue comfort care, drink plenty of water.   URI  This is a new problem. The current episode started in the past 7 days. The problem has been gradually worsening. There has been no fever. Associated symptoms include chest pain, congestion, coughing, headaches, rhinorrhea, sinus pain and sneezing. Pertinent negatives include no abdominal pain, diarrhea, ear pain, joint pain, plugged ear sensation or wheezing. Treatments tried: Alkaselzer, Nyquil, Mucinex. The treatment provided mild relief.    No other concerns at this time.   Past Medical History:  Diagnosis Date   Acute ischemic right MCA stroke (HCC) 08/15/2019   Acute respiratory failure (HCC) 01/16/2021   a.) in setting of CAP   Anxiety    Aortic atherosclerosis (HCC)    Atrial fibrillation (HCC)    a.) CHA2DS2-VASc = 5 (sex, HTN, CVA x2, vascular disease history) as of 08/20/2023; b.) cardiac rate/rhythm maintained on oral amiodarone  + metoprolol ; chronically anticoagulated using rivaroxaban ; on chronic clopidogrel    Avascular necrosis of left femoral head (HCC) 04/09/2018   CAD (coronary artery disease) 03/13/2023   a.) MV 03/13/2023: small mild reversible bas/mid anterosep/inferosep/apex defects c/w isch; b.) cCTA 03/29/2023: Ca2+ = 673.3 (99th %'ile)   Carotid artery disease (HCC) 08/16/2019   a.)  doppler 08/16/2019: 40-59% LICA   Chronic midline low back pain with right-sided sciatica 02/11/2016   Community acquired pneumonia 01/16/2021   Depression    Essential hypertension    Fibromyalgia    Foraminal stenosis of lumbar region 02/27/2018   GERD (gastroesophageal reflux disease)    Long term current use of amiodarone     Lumbar radiculopathy 12/15/2016   Mixed hyperlipidemia    Multifocal pneumonia 06/05/2021   OAB (overactive bladder)    On chronic clopidogrel  therapy    On rivaroxaban  therapy    Osteoarthritis of left hip 2019   Restless leg syndrome 02/02/2015   Rotator cuff tendinitis, right 06/04/2023   Sepsis (HCC) 01/16/2021   T2DM (type 2 diabetes mellitus) (HCC)    Tendinitis of upper biceps tendon of right shoulder 06/04/2023   Traumatic complete tear of right rotator cuff 06/04/2023    Past Surgical History:  Procedure Laterality Date   ABDOMINAL SURGERY  1996   gastric bypass; stapling; surgilite   GASTRIC BYPASS OPEN  1996   PLANTAR FASCIA SURGERY Right 2009   REVERSE SHOULDER ARTHROPLASTY Right 08/23/2023   Procedure: REVERSE SHOULDER ARTHROPLASTY WITH BICEPS TENODESIS;  Surgeon: Edie Norleen JINNY, MD;  Location: [facility];  Service: Orthopedics;  Laterality: Right;   TARSAL TUNNEL RELEASE Right 2009   TOTAL HIP ARTHROPLASTY Left 04/09/2018   Procedure: TOTAL HIP ARTHROPLASTY ANTERIOR APPROACH;  Surgeon: Kathlynn Sharper, MD;  Location: [facility];  Service: Orthopedics;  Laterality: Left;   TOTAL KNEE ARTHROPLASTY Right 07/12/2021   Procedure: TOTAL KNEE ARTHROPLASTY;  Surgeon: Kathlynn Sharper, MD;  Location: [facility];  Service: Orthopedics;  Laterality: Right;   TOTAL KNEE ARTHROPLASTY Left 02/16/2022   Procedure: TOTAL KNEE ARTHROPLASTY;  Surgeon: Kathlynn Sharper, MD;  Location: [facility];  Service: Orthopedics;  Laterality: Left;    Social History   Socioeconomic History   Marital status: Legally Separated    Spouse name: Not on file   Number of children: 0    Years of education: Not on file   Highest education level: Not on file  Occupational History   Not on file  Tobacco Use   Smoking status: Former    Current packs/day: 0.00    Types: Cigarettes    Quit date: 07/2019    Years since quitting: 4.9   Smokeless tobacco: Never  Vaping Use   Vaping status: Never Used  Substance and Sexual Activity   Alcohol use: Not Currently   Drug use: Not Currently    Types: Marijuana    Comment: none since 2019   Sexual activity: Not Currently  Other Topics Concern   Not on file  Social History Narrative   Lives with mother   Social Drivers of Health   Financial Resource Strain: Low Risk  (01/16/2022)   Received from [facility]   Overall Financial Resource Strain (CARDIA)    Difficulty of Paying Living Expenses: Not very hard  Food Insecurity: Unknown (01/16/2022)   Received from [facility]   Hunger Vital Sign    Within the past 12 months, you worried that your food would run out before you got the money to buy more.: Never true    Ran Out of Food in the Last Year: Not on file  Transportation Needs: No Transportation Needs (01/16/2022)   Received from [facility] - Transportation    In the past 12 months, has lack of transportation kept you from medical appointments or from getting medications?: No    Lack of Transportation (Non-Medical): No  Physical Activity: Inactive (01/16/2022)   Received from [facility]   Exercise Vital Sign    On average, how many days per week do you engage in moderate to strenuous exercise (like a brisk walk)?: 0 days    On average, how many minutes do you engage in exercise at this level?: 0 min  Stress: No Stress Concern Present (01/16/2022)   Received from [facility] - Occupational Stress Questionnaire    Feeling of Stress : Not at all  Social Connections: Moderately  Integrated (01/16/2022)   Received from [facility]   Social Connection and Isolation Panel    In a typical week, how many times do you talk on the phone with family, friends, or neighbors?: More than three times a week    How often do you get together with friends or relatives?: Never    How often do you attend church or religious services?: More than 4 times per year    Do you belong to any clubs or organizations such as church groups, unions, fraternal or athletic groups, or school groups?: Yes    How often do you attend meetings of the clubs or organizations you belong to?: More than 4 times per year    Are you married, widowed, divorced, separated, never married, or living with a partner?: Separated  Intimate Partner Violence: Not on file    Family History  Problem Relation Age of Onset   Aneurysm Mother  Heart disease Father     Allergies  Allergen Reactions   Penicillins Hives, Other (See Comments) and Rash    TOLERATED ROCEPHIN  AND CEFAZOLIN   Did it involve swelling of the face/tongue/throat, SOB, or low BP? Yes  Did it involve sudden or severe rash/hives, skin peeling, or any reaction on the inside of your mouth or nose? No  Did you need to seek medical attention at a hospital or doctor's office? No  When did it last happen? Within the past 10 years   If all above answers are NO, may proceed with cephalosporin use.  Did it involve swelling of the face/tongue/throat, SOB, or low BP? Yes  Did it involve sudden or severe rash/hives, skin peeling, or any reaction on the inside of your mouth or nose? No  Did you need to seek medical attention at a hospital or doctor's office? No  When did it last happen? Within the past 10 years   If all above answers are "NO", may proceed with cephalosporin use.  Has patient had a PCN reaction causing immediate rash, facial/tongue/throat swelling, SOB or lightheadedness with hypotension: Yes  Has patient had a PCN  reaction causing severe rash involving mucus membranes or skin necrosis: No  Has patient had a PCN reaction that required hospitalization: No  Has patient had a PCN reaction occurring within the last 10 years: Yes  If all of the above answers are NO, then may proceed with Cephalosporin use.  Did it involve swelling of the face/tongue/throat, SOB, or low BP? Yes    Did it involve sudden or severe rash/hives, skin peeling, or any reaction on the inside of your mouth or nose? No    Did you need to seek medical attention at a hospital or doctor's office? No    When did it last happen? Within the past 10 years     If all above answers are "NO", may proceed with cephalosporin use.    Has patient had a PCN reaction causing immediate rash, facial/tongue/throat swelling, SOB or lightheadedness with hypotension: Yes    Has patient had a PCN reaction causing severe rash involving mucus membranes or skin necrosis: No    Has patient had a PCN reaction that required hospitalization: No    Has patient had a PCN reaction occurring within the last 10 years: Yes    If all of the above answers are NO, then may proceed with Cephalosporin use.    TOLERATED ROCEPHIN  AND CEFAZOLIN   Did it involve swelling of the face/tongue/throat, SOB, or low BP? Yes  Did it involve sudden or severe rash/hives, skin peeling, or any reaction on the inside of your mouth or nose? No  Did you need to seek medical attention at a hospital or doctor's office? No  When did it last happen? Within the past 10 years   If all above answers are NO, may proceed with cephalosporin use.  Did it involve swelling of the face/tongue/throat, SOB, or low BP? Yes Did it involve sudden or severe rash/hives, skin peeling, or any reaction on the inside of your mouth or nose? No Did you need to seek medical attention at a hospital or docto... (TRUNCATED)   Levofloxacin  Other (See Comments)    Dizziness   Oxycodone  Itching   Paxlovid  [Nirmatrelvir-Ritonavir]    Veozah  [Fezolinetant ] Rash   Vilazodone Other (See Comments)    unknown    Outpatient Medications Prior to Visit  Medication Sig   ACCU-CHEK GUIDE test strip USE TO  CHECK BLOOD GLUCOSE ONCE DAILY   amiodarone  (PACERONE ) 200 MG tablet Take 1 tablet (200 mg total) by mouth daily as needed.   Ascorbic Acid (VITAMIN C WITH ROSE HIPS) 1000 MG tablet Take 1,000 mg by mouth daily.   atorvastatin  (LIPITOR ) 80 MG tablet TAKE 1 TABLET BY MOUTH EVERY DAY FOR CHOLESTEROL   azelastine  (ASTELIN ) 0.1 % nasal spray Place 2 sprays into both nostrils 2 (two) times daily. Use in each nostril as directed   B Complex-C (B-COMPLEX WITH VITAMIN C) tablet Take 1 tablet by mouth daily.   benzonatate  (TESSALON  PERLES) 100 MG capsule Take 1 capsule (100 mg total) by mouth 3 (three) times daily as needed for cough.   BIOTIN PO Take 1 drop by mouth. With collagen   BLACK COHOSH EXTRACT PO Take 1 tablet by mouth daily.   buPROPion  (WELLBUTRIN  XL) 150 MG 24 hr tablet TAKE 1 TABLET (150 MG TOTAL) BY MOUTH IN THE MORNING   cariprazine  (VRAYLAR ) 3 MG capsule Take 1 capsule (3 mg total) by mouth daily.   Cholecalciferol  (VITAMIN D3) 125 MCG (5000 UT) CAPS Take 5,000 Units by mouth daily.   dapagliflozin  propanediol (FARXIGA ) 10 MG TABS tablet Take 1 tablet (10 mg total) by mouth daily.   diclofenac Sodium (VOLTAREN) 1 % GEL Apply 2 g topically 4 (four) times daily. As needed   DULoxetine  (CYMBALTA ) 60 MG capsule TAKE 1 CAPSULE BY MOUTH TWICE A DAY   gabapentin  (NEURONTIN ) 600 MG tablet Take 1 tablet (600 mg total) by mouth 2 (two) times daily with breakfast and lunch AND 2 tablets (1,200 mg total) at bedtime. Take 1 tablet (300 mg) by mouth in the morning & take 2 tablets (600 mg) by mouth at night.   GEMTESA  75 MG TABS TAKE 1 TABLET BY MOUTH AT BEDTIME.   HYDROcodone -acetaminophen  (NORCO) 10-325 MG tablet Take 1 tablet by mouth every 4 (four) hours as needed for moderate pain (pain score 4-6) or  severe pain (pain score 7-10) (pain).   metoprolol  succinate (TOPROL -XL) 25 MG 24 hr tablet TAKE 1 TABLET (25 MG TOTAL) BY MOUTH DAILY.   metoprolol  tartrate (LOPRESSOR ) 50 MG tablet TAKE 1 TABLET BY MOUTH TWICE A DAY   pantoprazole  (PROTONIX ) 40 MG tablet Take 1 tablet (40 mg total) by mouth daily.   rivaroxaban  (XARELTO ) 20 MG TABS tablet Take 1 tablet (20 mg total) by mouth daily with supper.   tirzepatide  (MOUNJARO ) 15 MG/0.5ML Pen Inject 15 mg into the skin once a week.   traZODone  (DESYREL ) 50 MG tablet TAKE 1 TABLET BY MOUTH EVERYDAY AT BEDTIME   VITAMIN E PO Take 1 capsule by mouth every evening.   vortioxetine  HBr (TRINTELLIX ) 10 MG TABS tablet Take 1 tablet (10 mg total) by mouth daily.   clopidogrel  (PLAVIX ) 75 MG tablet TAKE 1 TABLET BY MOUTH EVERY DAY (Patient not taking: Reported on 06/24/2024)   doxycycline  (VIBRA -TABS) 100 MG tablet TAKE 1 TABLET BY MOUTH 2 TIMES DAILY FOR 7 DAYS. (Patient not taking: Reported on 06/24/2024)   Lactobacillus-Inulin (PROBIOTIC DIGESTIVE SUPPORT PO) Take by mouth. (Patient not taking: Reported on 06/24/2024)   lisinopril  (ZESTRIL ) 10 MG tablet TAKE 1 TABLET BY MOUTH EVERY DAY (Patient not taking: Reported on 06/24/2024)   Magnesium  500 MG CAPS Take 500 mg by mouth in the morning. (Patient not taking: Reported on 06/24/2024)   Sod Fluoride -Potassium Nitrate  (PREVIDENT 5000 ENAMEL PROTECT) 1.1-5 % GEL Use paste to brush teeth 2 (two) times daily morning and night. Do not  eat or drink 30 minutes after use. (Patient not taking: Reported on 06/24/2024)   No facility-administered medications prior to visit.    Review of Systems  Constitutional: Negative.   HENT:  Positive for congestion, rhinorrhea, sinus pain and sneezing. Negative for ear pain.   Eyes: Negative.   Respiratory:  Positive for cough and sputum production. Negative for shortness of breath, wheezing and stridor.   Cardiovascular:  Positive for chest pain.  Gastrointestinal: Negative.  Negative for  abdominal pain, constipation and diarrhea.  Genitourinary: Negative.   Musculoskeletal:  Negative for joint pain and myalgias.  Skin: Negative.   Neurological:  Positive for dizziness and headaches.  Endo/Heme/Allergies: Negative.   All other systems reviewed and are negative.      Objective:   BP 118/70   Pulse (!) 56   Ht 5' 1 (1.549 m)   Wt 227 lb 9.6 oz (103.2 kg)   LMP 04/01/2018 (Exact Date)   SpO2 96%   BMI 43.00 kg/m   Vitals:   06/24/24 1020  BP: 118/70  Pulse: (!) 56  Height: 5' 1 (1.549 m)  Weight: 227 lb 9.6 oz (103.2 kg)  SpO2: 96%  BMI (Calculated): 43.03    Physical Exam Vitals and nursing note reviewed.  Constitutional:      Appearance: Normal appearance. She is normal weight.  HENT:     Head: Normocephalic and atraumatic.     Nose: Nose normal.     Mouth/Throat:     Mouth: Mucous membranes are moist.  Eyes:     Extraocular Movements: Extraocular movements intact.     Conjunctiva/sclera: Conjunctivae normal.     Pupils: Pupils are equal, round, and reactive to light.  Cardiovascular:     Rate and Rhythm: Normal rate and regular rhythm.     Pulses: Normal pulses.     Heart sounds: Normal heart sounds.  Pulmonary:     Effort: Pulmonary effort is normal.     Breath sounds: Normal breath sounds. No stridor. No wheezing, rhonchi or rales.  Abdominal:     General: Abdomen is flat. Bowel sounds are normal.     Palpations: Abdomen is soft.  Musculoskeletal:        General: Normal range of motion.     Cervical back: Normal range of motion.  Skin:    General: Skin is warm and dry.  Neurological:     General: No focal deficit present.     Mental Status: She is alert and oriented to person, place, and time.  Psychiatric:        Mood and Affect: Mood normal.        Behavior: Behavior normal.        Thought Content: Thought content normal.        Judgment: Judgment normal.      No results found for any visits on 06/24/24.  No results found  for this or any previous visit (from the past 2160 hours).    Assessment & Plan:  Z-pack Medrol  dose pack Comfort care Drink plenty of water  Problem List Items Addressed This Visit       Respiratory   Lower resp. tract infection - Primary   Relevant Medications   azithromycin  (ZITHROMAX  Z-PAK) 250 MG tablet    Return if symptoms worsen or fail to improve, for as scheduled.   Total time spent: 25 minutes  [employer], NP  06/24/2024   This document may have been prepared by [employer] Voice Recognition software and  as such may include unintentional dictation errors.

## 2024-06-26 NOTE — Telephone Encounter (Signed)
 Patient came in for appt.

## 2024-07-08 ENCOUNTER — Other Ambulatory Visit: Payer: Self-pay | Admitting: Family

## 2024-07-15 ENCOUNTER — Encounter: Payer: Self-pay | Admitting: Family

## 2024-07-15 ENCOUNTER — Ambulatory Visit: Admitting: Family

## 2024-07-15 ENCOUNTER — Other Ambulatory Visit: Payer: Self-pay

## 2024-07-15 VITALS — BP 112/82 | HR 71 | Ht 61.0 in | Wt 234.0 lb

## 2024-07-15 DIAGNOSIS — J441 Chronic obstructive pulmonary disease with (acute) exacerbation: Secondary | ICD-10-CM | POA: Diagnosis not present

## 2024-07-15 DIAGNOSIS — E1165 Type 2 diabetes mellitus with hyperglycemia: Secondary | ICD-10-CM

## 2024-07-15 DIAGNOSIS — M5416 Radiculopathy, lumbar region: Secondary | ICD-10-CM

## 2024-07-15 DIAGNOSIS — M5431 Sciatica, right side: Secondary | ICD-10-CM

## 2024-07-15 DIAGNOSIS — I10 Essential (primary) hypertension: Secondary | ICD-10-CM | POA: Diagnosis not present

## 2024-07-15 DIAGNOSIS — M5432 Sciatica, left side: Secondary | ICD-10-CM

## 2024-07-15 DIAGNOSIS — I69359 Hemiplegia and hemiparesis following cerebral infarction affecting unspecified side: Secondary | ICD-10-CM

## 2024-07-15 DIAGNOSIS — M797 Fibromyalgia: Secondary | ICD-10-CM | POA: Diagnosis not present

## 2024-07-15 DIAGNOSIS — E559 Vitamin D deficiency, unspecified: Secondary | ICD-10-CM

## 2024-07-15 DIAGNOSIS — F1120 Opioid dependence, uncomplicated: Secondary | ICD-10-CM | POA: Insufficient documentation

## 2024-07-15 DIAGNOSIS — M791 Myalgia, unspecified site: Secondary | ICD-10-CM

## 2024-07-15 DIAGNOSIS — F331 Major depressive disorder, recurrent, moderate: Secondary | ICD-10-CM

## 2024-07-15 MED ORDER — METOPROLOL SUCCINATE ER 25 MG PO TB24: 25.0000 mg | ORAL_TABLET | Freq: Every day | ORAL | 1 refills | Status: AC

## 2024-07-15 MED ORDER — CARIPRAZINE HCL 3 MG PO CAPS: 3.0000 mg | ORAL_CAPSULE | Freq: Every day | ORAL | 1 refills | Status: AC

## 2024-07-15 MED ORDER — MOUNJARO 15 MG/0.5ML ~~LOC~~ SOAJ
15.0000 mg | SUBCUTANEOUS | 1 refills | Status: AC
  Filled 2024-07-15: qty 6, 84d supply, fill #0

## 2024-07-15 MED ORDER — ATORVASTATIN CALCIUM 80 MG PO TABS: 80.0000 mg | ORAL_TABLET | Freq: Every day | ORAL | 3 refills | Status: AC

## 2024-07-15 NOTE — Assessment & Plan Note (Signed)
 Patient is seen by Pain clinic, who manage this condition.  She is well controlled with current therapy.   Will defer to them for further changes to plan of care.

## 2024-07-15 NOTE — Assessment & Plan Note (Signed)
 Patient stable.  Well controlled with current therapy.   Continue current meds.

## 2024-07-15 NOTE — Assessment & Plan Note (Signed)
 Checking labs today.  Will continue supplements as needed.   - Vitamin D  - Vitamin B12 - TSH

## 2024-07-15 NOTE — Assessment & Plan Note (Signed)
 Sending referral to provider as requested by pt.  Continue current meds.  Will adjust as needed based on results.  The patient is asked to make an attempt to improve diet and exercise patterns to aid in medical management of this problem. Addressed importance of increasing and maintaining water intake.

## 2024-07-15 NOTE — Assessment & Plan Note (Signed)
 Checking labs today. Will call pt. With results  Continue current diabetes POC, as patient has been well controlled on current regimen.  Will adjust meds if needed based on labs.   -CBC w/Diff -CMP w/eGFR -Hemoglobin A1C

## 2024-07-15 NOTE — Progress Notes (Signed)
 Established Patient Office Visit  Subjective:  Patient ID: Terri Wood, female    DOB: Jan 04, 1966  Age: 58 y.o. MRN: [id number]  Chief Complaint  Patient presents with   Follow-up    Discuss Mounjaro     Patient is here today for follow up.  She is moving in about a week and a half, to [place] and needs to get as many refills for her meds as possible in the meantime.   She says that her mood has been some improved with the continued Vraylar , asks if we can get her set up with 90 day supplies for what she has right now.   No other concerns today.     Past Medical History:  Diagnosis Date   Acute ischemic right MCA stroke (HCC) 08/15/2019   Acute respiratory failure (HCC) 01/16/2021   a.) in setting of CAP   Anxiety    Aortic atherosclerosis    Atrial fibrillation (HCC)    a.) CHA2DS2-VASc = 5 (sex, HTN, CVA x2, vascular disease history) as of 08/20/2023; b.) cardiac rate/rhythm maintained on oral amiodarone  + metoprolol ; chronically anticoagulated using rivaroxaban ; on chronic clopidogrel    Avascular necrosis of left femoral head (HCC) 04/09/2018   CAD (coronary artery disease) 03/13/2023   a.) MV 03/13/2023: small mild reversible bas/mid anterosep/inferosep/apex defects c/w isch; b.) cCTA 03/29/2023: Ca2+ = 673.3 (99th %'ile)   Carotid artery disease 08/16/2019   a.) doppler 08/16/2019: 40-59% LICA   Chronic midline low back pain with right-sided sciatica 02/11/2016   Community acquired pneumonia 01/16/2021   Depression    Essential hypertension    Fibromyalgia    Foraminal stenosis of lumbar region 02/27/2018   GERD (gastroesophageal reflux disease)    Long term current use of amiodarone     Lumbar radiculopathy 12/15/2016   Mixed hyperlipidemia    Multifocal pneumonia 06/05/2021   OAB (overactive bladder)    On chronic clopidogrel  therapy    On rivaroxaban  therapy    Osteoarthritis of left hip 2019   Restless leg syndrome 02/02/2015   Rotator cuff tendinitis, right  06/04/2023   Sepsis (HCC) 01/16/2021   T2DM (type 2 diabetes mellitus) (HCC)    Tendinitis of upper biceps tendon of right shoulder 06/04/2023   Traumatic complete tear of right rotator cuff 06/04/2023    Past Surgical History:  Procedure Laterality Date   ABDOMINAL SURGERY  1996   gastric bypass; stapling; surgilite   GASTRIC BYPASS OPEN  1996   PLANTAR FASCIA SURGERY Right 2009   REVERSE SHOULDER ARTHROPLASTY Right 08/23/2023   Procedure: REVERSE SHOULDER ARTHROPLASTY WITH BICEPS TENODESIS;  Surgeon: Edie Norleen JINNY, MD;  Location: [facility];  Service: Orthopedics;  Laterality: Right;   TARSAL TUNNEL RELEASE Right 2009   TOTAL HIP ARTHROPLASTY Left 04/09/2018   Procedure: TOTAL HIP ARTHROPLASTY ANTERIOR APPROACH;  Surgeon: Kathlynn Sharper, MD;  Location: [facility];  Service: Orthopedics;  Laterality: Left;   TOTAL KNEE ARTHROPLASTY Right 07/12/2021   Procedure: TOTAL KNEE ARTHROPLASTY;  Surgeon: Kathlynn Sharper, MD;  Location: [facility];  Service: Orthopedics;  Laterality: Right;   TOTAL KNEE ARTHROPLASTY Left 02/16/2022   Procedure: TOTAL KNEE ARTHROPLASTY;  Surgeon: Kathlynn Sharper, MD;  Location: [facility];  Service: Orthopedics;  Laterality: Left;    Social History   Socioeconomic History   Marital status: Legally Separated    Spouse name: Not on file   Number of children: 0   Years of education: Not on file   Highest education level: Not on file  Occupational  History   Not on file  Tobacco Use   Smoking status: Former    Current packs/day: 0.00    Types: Cigarettes    Quit date: 07/2019    Years since quitting: 5.0   Smokeless tobacco: Never  Vaping Use   Vaping status: Never Used  Substance and Sexual Activity   Alcohol use: Not Currently   Drug use: Not Currently    Types: Marijuana    Comment: none since 2019   Sexual activity: Not Currently  Other Topics Concern   Not on file  Social History Narrative   Lives with mother   Social Drivers of Health   Financial  Resource Strain: Low Risk  (01/16/2022)   Received from [facility]   Overall Financial Resource Strain (CARDIA)    Difficulty of Paying Living Expenses: Not very hard  Food Insecurity: Unknown (01/16/2022)   Received from [facility]   Hunger Vital Sign    Within the past 12 months, you worried that your food would run out before you got the money to buy more.: Never true    Ran Out of Food in the Last Year: Not on file  Transportation Needs: No Transportation Needs (01/16/2022)   Received from [facility] - Transportation    In the past 12 months, has lack of transportation kept you from medical appointments or from getting medications?: No    Lack of Transportation (Non-Medical): No  Physical Activity: Inactive (01/16/2022)   Received from [facility]   Exercise Vital Sign    On average, how many days per week do you engage in moderate to strenuous exercise (like a brisk walk)?: 0 days    On average, how many minutes do you engage in exercise at this level?: 0 min  Stress: No Stress Concern Present (01/16/2022)   Received from [facility] - Occupational Stress Questionnaire    Feeling of Stress : Not at all  Social Connections: Moderately Integrated (01/16/2022)   Received from [facility]   Social Connection and Isolation Panel    In a typical week, how many times do you talk on the phone with family, friends, or neighbors?: More than three times a week    How often do you get together with friends or relatives?: Never    How often do you attend church or religious services?: More than 4 times per year    Do you belong to any clubs or organizations such as church groups, unions, fraternal or athletic groups, or school groups?: Yes    How often do you attend meetings of the clubs or organizations you belong to?: More than 4 times per  year    Are you married, widowed, divorced, separated, never married, or living with a partner?: Separated  Intimate Partner Violence: Not on file    Family History  Problem Relation Age of Onset   Aneurysm Mother    Heart disease Father     Allergies  Allergen Reactions   Penicillins Hives, Other (See Comments) and Rash    TOLERATED ROCEPHIN  AND CEFAZOLIN   Did it involve swelling of the face/tongue/throat, SOB, or low BP? Yes  Did it involve sudden or severe rash/hives, skin peeling, or any reaction on the inside of your mouth or nose? No  Did you need to seek medical attention at a hospital or doctor's office? No  When  did it last happen? Within the past 10 years   If all above answers are NO, may proceed with cephalosporin use.  Did it involve swelling of the face/tongue/throat, SOB, or low BP? Yes  Did it involve sudden or severe rash/hives, skin peeling, or any reaction on the inside of your mouth or nose? No  Did you need to seek medical attention at a hospital or doctor's office? No  When did it last happen? Within the past 10 years   If all above answers are "NO", may proceed with cephalosporin use.  Has patient had a PCN reaction causing immediate rash, facial/tongue/throat swelling, SOB or lightheadedness with hypotension: Yes  Has patient had a PCN reaction causing severe rash involving mucus membranes or skin necrosis: No  Has patient had a PCN reaction that required hospitalization: No  Has patient had a PCN reaction occurring within the last 10 years: Yes  If all of the above answers are NO, then may proceed with Cephalosporin use.  Did it involve swelling of the face/tongue/throat, SOB, or low BP? Yes    Did it involve sudden or severe rash/hives, skin peeling, or any reaction on the inside of your mouth or nose? No    Did you need to seek medical attention at a hospital or doctor's office? No    When did it last happen? Within the past 10 years     If  all above answers are "NO", may proceed with cephalosporin use.    Has patient had a PCN reaction causing immediate rash, facial/tongue/throat swelling, SOB or lightheadedness with hypotension: Yes    Has patient had a PCN reaction causing severe rash involving mucus membranes or skin necrosis: No    Has patient had a PCN reaction that required hospitalization: No    Has patient had a PCN reaction occurring within the last 10 years: Yes    If all of the above answers are NO, then may proceed with Cephalosporin use.    TOLERATED ROCEPHIN  AND CEFAZOLIN   Did it involve swelling of the face/tongue/throat, SOB, or low BP? Yes  Did it involve sudden or severe rash/hives, skin peeling, or any reaction on the inside of your mouth or nose? No  Did you need to seek medical attention at a hospital or doctor's office? No  When did it last happen? Within the past 10 years   If all above answers are NO, may proceed with cephalosporin use.  Did it involve swelling of the face/tongue/throat, SOB, or low BP? Yes Did it involve sudden or severe rash/hives, skin peeling, or any reaction on the inside of your mouth or nose? No Did you need to seek medical attention at a hospital or docto... (TRUNCATED)   Levofloxacin  Other (See Comments)    Dizziness   Oxycodone  Itching   Paxlovid [Nirmatrelvir-Ritonavir]    Veozah  [Fezolinetant ] Rash   Vilazodone Other (See Comments)    unknown    Review of Systems  All other systems reviewed and are negative.      Objective:   BP 112/82   Pulse 71   Ht 5' 1 (1.549 m)   Wt 234 lb (106.1 kg)   LMP 04/01/2018 (Exact Date)   SpO2 96%   BMI 44.21 kg/m   Vitals:   07/15/24 0916  BP: 112/82  Pulse: 71  Height: 5' 1 (1.549 m)  Weight: 234 lb (106.1 kg)  SpO2: 96%  BMI (Calculated): 44.24    Physical Exam Vitals and nursing note reviewed.  Constitutional:      Appearance: Normal appearance. She is normal weight.  HENT:     Head: Normocephalic.   Eyes:     Extraocular Movements: Extraocular movements intact.     Conjunctiva/sclera: Conjunctivae normal.     Pupils: Pupils are equal, round, and reactive to light.  Cardiovascular:     Rate and Rhythm: Normal rate.  Pulmonary:     Effort: Pulmonary effort is normal.  Neurological:     General: No focal deficit present.     Mental Status: She is alert and oriented to person, place, and time. Mental status is at baseline.  Psychiatric:        Mood and Affect: Mood normal.        Behavior: Behavior normal.        Thought Content: Thought content normal.      No results found for any visits on 07/15/24.  No results found for this or any previous visit (from the past 2160 hours).     Assessment & Plan CVA, old, hemiparesis (HCC) Patient stable.  Well controlled with current therapy.   Continue current meds.   COPD with acute exacerbation (HCC) Patient stable.  Well controlled with current therapy.   Continue current meds.   Major depressive disorder, recurrent episode, moderate (HCC) Patient stable.  Well controlled with current therapy.   Continue current meds.   Vitamin D  deficiency, unspecified Checking labs today.  Will continue supplements as needed.   - Vitamin D  - Vitamin B12 - TSH  Type 2 diabetes mellitus with hyperglycemia, without long-term current use of insulin  (HCC) Checking labs today. Will call pt. With results  Continue current diabetes POC, as patient has been well controlled on current regimen.  Will adjust meds if needed based on labs.   -CBC w/Diff -CMP w/eGFR -Hemoglobin A1C  Bilateral sciatica Myalgia Lumbar radiculopathy Fibromyalgia Patient is seen by Pain clinic, who manage this condition.  She is well controlled with current therapy.   Will defer to them for further changes to plan of care.  Morbid obesity (HCC) Sending referral to provider as requested by pt.  Continue current meds.  Will adjust as needed based on  results.  The patient is asked to make an attempt to improve diet and exercise patterns to aid in medical management of this problem. Addressed importance of increasing and maintaining water intake.   Essential hypertension, benign Blood pressure well controlled with current medications.  Continue current therapy.  Will reassess at follow up.      No follow-ups on file. Patient is relocating.  Total time spent: 20 minutes  ALAN CHRISTELLA ARRANT, FNP  07/15/2024   This document may have been prepared by [facility] Voice Recognition software and as such may include unintentional dictation errors.

## 2024-07-15 NOTE — Assessment & Plan Note (Signed)
 Blood pressure well controlled with current medications.  Continue current therapy.  Will reassess at follow up.

## 2024-07-18 ENCOUNTER — Other Ambulatory Visit: Payer: Self-pay

## 2024-08-06 ENCOUNTER — Other Ambulatory Visit: Payer: Self-pay | Admitting: Family

## 2024-08-12 ENCOUNTER — Other Ambulatory Visit: Payer: Self-pay

## 2024-08-14 ENCOUNTER — Ambulatory Visit: Admitting: Family

## 2024-09-06 ENCOUNTER — Other Ambulatory Visit: Payer: Self-pay | Admitting: Family

## 2024-09-27 ENCOUNTER — Other Ambulatory Visit: Payer: Self-pay | Admitting: Family

## 2024-10-10 ENCOUNTER — Other Ambulatory Visit: Payer: Self-pay

## 2024-10-27 NOTE — Progress Notes (Signed)
 Annalycia J Asfaw                                          MRN: [id number]   10/27/2024   The [facility] Quality Team Specialist reviewed this patient medical record for the purposes of chart review for care gap closure. The following were reviewed: abstraction for care gap closure-glycemic status assessment.    [facility] Quality Team

## 2024-11-07 NOTE — Progress Notes (Signed)
 BERLYNN WARSAME                                          MRN: [id number]   11/07/2024   The [facility] Quality Team Specialist reviewed this patient medical record for the purposes of chart review for care gap closure. The following were reviewed: chart review for care gap closure-glycemic status assessment.    [facility] Quality Team
